# Patient Record
Sex: Female | Born: 1935 | Race: White | Hispanic: No | Smoking: Former smoker
Health system: Southern US, Community
[De-identification: ages and names within clinical notes are randomized; demographics above are authoritative.]

## PROBLEM LIST (undated history)

## (undated) DIAGNOSIS — R112 Nausea with vomiting, unspecified: Secondary | ICD-10-CM

## (undated) DIAGNOSIS — Z9889 Other specified postprocedural states: Secondary | ICD-10-CM

## (undated) DIAGNOSIS — Z9289 Personal history of other medical treatment: Secondary | ICD-10-CM

## (undated) DIAGNOSIS — M1990 Unspecified osteoarthritis, unspecified site: Secondary | ICD-10-CM

## (undated) DIAGNOSIS — J189 Pneumonia, unspecified organism: Secondary | ICD-10-CM

## (undated) DIAGNOSIS — D649 Anemia, unspecified: Secondary | ICD-10-CM

## (undated) DIAGNOSIS — K219 Gastro-esophageal reflux disease without esophagitis: Secondary | ICD-10-CM

## (undated) DIAGNOSIS — F419 Anxiety disorder, unspecified: Secondary | ICD-10-CM

## (undated) DIAGNOSIS — IMO0001 Reserved for inherently not codable concepts without codable children: Secondary | ICD-10-CM

## (undated) DIAGNOSIS — Z974 Presence of external hearing-aid: Secondary | ICD-10-CM

## (undated) DIAGNOSIS — I10 Essential (primary) hypertension: Secondary | ICD-10-CM

## (undated) DIAGNOSIS — H409 Unspecified glaucoma: Secondary | ICD-10-CM

## (undated) DIAGNOSIS — Q2730 Arteriovenous malformation, site unspecified: Secondary | ICD-10-CM

## (undated) DIAGNOSIS — H9190 Unspecified hearing loss, unspecified ear: Secondary | ICD-10-CM

## (undated) DIAGNOSIS — Z8679 Personal history of other diseases of the circulatory system: Secondary | ICD-10-CM

## (undated) DIAGNOSIS — C801 Malignant (primary) neoplasm, unspecified: Secondary | ICD-10-CM

## (undated) DIAGNOSIS — Z8489 Family history of other specified conditions: Secondary | ICD-10-CM

## (undated) DIAGNOSIS — Z8719 Personal history of other diseases of the digestive system: Secondary | ICD-10-CM

## (undated) DIAGNOSIS — Z973 Presence of spectacles and contact lenses: Secondary | ICD-10-CM

## (undated) HISTORY — PX: BREAST SURGERY: SHX581

## (undated) HISTORY — PX: TONSILLECTOMY: SUR1361

## (undated) HISTORY — PX: BACK SURGERY: SHX140

## (undated) HISTORY — PX: ABDOMINAL HYSTERECTOMY: SHX81

---

## 1961-07-22 DIAGNOSIS — Z9289 Personal history of other medical treatment: Secondary | ICD-10-CM

## 1961-07-22 HISTORY — DX: Personal history of other medical treatment: Z92.89

## 1995-03-20 ENCOUNTER — Encounter: Payer: Self-pay | Admitting: Gastroenterology

## 2000-09-05 ENCOUNTER — Encounter: Payer: Self-pay | Admitting: Gastroenterology

## 2000-10-21 ENCOUNTER — Ambulatory Visit (HOSPITAL_COMMUNITY): Admission: RE | Admit: 2000-10-21 | Discharge: 2000-10-21 | Payer: Self-pay | Admitting: Ophthalmology

## 2001-04-16 ENCOUNTER — Encounter: Payer: Self-pay | Admitting: Ophthalmology

## 2001-04-21 ENCOUNTER — Ambulatory Visit (HOSPITAL_COMMUNITY): Admission: RE | Admit: 2001-04-21 | Discharge: 2001-04-21 | Payer: Self-pay | Admitting: Ophthalmology

## 2002-04-16 ENCOUNTER — Other Ambulatory Visit: Admission: RE | Admit: 2002-04-16 | Discharge: 2002-04-16 | Payer: Self-pay | Admitting: Geriatric Medicine

## 2002-05-28 ENCOUNTER — Encounter: Payer: Self-pay | Admitting: Internal Medicine

## 2002-06-01 ENCOUNTER — Encounter: Admission: RE | Admit: 2002-06-01 | Discharge: 2002-06-01 | Payer: Self-pay | Admitting: Radiology

## 2002-06-01 ENCOUNTER — Encounter: Payer: Self-pay | Admitting: Radiology

## 2002-09-29 ENCOUNTER — Encounter: Admission: RE | Admit: 2002-09-29 | Discharge: 2002-09-29 | Payer: Self-pay | Admitting: Geriatric Medicine

## 2002-09-29 ENCOUNTER — Encounter: Payer: Self-pay | Admitting: Geriatric Medicine

## 2003-04-06 ENCOUNTER — Encounter: Admission: RE | Admit: 2003-04-06 | Discharge: 2003-04-06 | Payer: Self-pay | Admitting: Geriatric Medicine

## 2003-04-06 ENCOUNTER — Encounter: Payer: Self-pay | Admitting: Geriatric Medicine

## 2003-08-05 ENCOUNTER — Ambulatory Visit (HOSPITAL_COMMUNITY): Admission: RE | Admit: 2003-08-05 | Discharge: 2003-08-05 | Payer: Self-pay | Admitting: Orthopedic Surgery

## 2003-08-29 ENCOUNTER — Encounter: Admission: RE | Admit: 2003-08-29 | Discharge: 2003-08-29 | Payer: Self-pay | Admitting: Orthopedic Surgery

## 2003-09-13 ENCOUNTER — Encounter: Admission: RE | Admit: 2003-09-13 | Discharge: 2003-09-13 | Payer: Self-pay | Admitting: Orthopedic Surgery

## 2003-10-05 ENCOUNTER — Encounter: Admission: RE | Admit: 2003-10-05 | Discharge: 2003-10-05 | Payer: Self-pay | Admitting: Orthopedic Surgery

## 2004-08-20 ENCOUNTER — Encounter: Admission: RE | Admit: 2004-08-20 | Discharge: 2004-08-20 | Payer: Self-pay | Admitting: Geriatric Medicine

## 2004-09-21 ENCOUNTER — Encounter: Admission: RE | Admit: 2004-09-21 | Discharge: 2004-09-21 | Payer: Self-pay | Admitting: Geriatric Medicine

## 2004-11-01 ENCOUNTER — Encounter: Admission: RE | Admit: 2004-11-01 | Discharge: 2004-11-01 | Payer: Self-pay | Admitting: Geriatric Medicine

## 2005-08-30 ENCOUNTER — Ambulatory Visit: Payer: Self-pay | Admitting: Gastroenterology

## 2005-09-13 ENCOUNTER — Encounter (INDEPENDENT_AMBULATORY_CARE_PROVIDER_SITE_OTHER): Payer: Self-pay | Admitting: *Deleted

## 2005-09-13 ENCOUNTER — Ambulatory Visit: Payer: Self-pay | Admitting: Gastroenterology

## 2009-03-29 ENCOUNTER — Encounter: Payer: Self-pay | Admitting: Gastroenterology

## 2009-04-27 ENCOUNTER — Encounter: Payer: Self-pay | Admitting: Gastroenterology

## 2009-05-02 ENCOUNTER — Ambulatory Visit: Payer: Self-pay | Admitting: Gastroenterology

## 2009-05-02 DIAGNOSIS — Z860100 Personal history of colon polyps, unspecified: Secondary | ICD-10-CM | POA: Insufficient documentation

## 2009-05-02 DIAGNOSIS — K219 Gastro-esophageal reflux disease without esophagitis: Secondary | ICD-10-CM | POA: Insufficient documentation

## 2009-05-02 DIAGNOSIS — D509 Iron deficiency anemia, unspecified: Secondary | ICD-10-CM | POA: Insufficient documentation

## 2009-05-03 ENCOUNTER — Ambulatory Visit: Payer: Self-pay | Admitting: Gastroenterology

## 2009-05-03 LAB — CONVERTED CEMR LAB
Eosinophils Absolute: 0.1 10*3/uL (ref 0.0–0.7)
Eosinophils Relative: 1.2 % (ref 0.0–5.0)
Lymphocytes Relative: 20 % (ref 12.0–46.0)
MCV: 78.7 fL (ref 78.0–100.0)
Monocytes Absolute: 0.6 10*3/uL (ref 0.1–1.0)
Neutrophils Relative %: 70.5 % (ref 43.0–77.0)
Platelets: 264 10*3/uL (ref 150.0–400.0)
WBC: 8.1 10*3/uL (ref 4.5–10.5)

## 2009-05-05 ENCOUNTER — Encounter (INDEPENDENT_AMBULATORY_CARE_PROVIDER_SITE_OTHER): Payer: Self-pay

## 2010-12-07 NOTE — H&P (Signed)
[place]. [facility]  Patient:    Amber Park, Amber Park Visit Number: [id number] MRN: [id number]          Service Type: DSU Location: [facility] 2899 19 Attending Physician:  Ivor Messier Dictated by:   Guadelupe Sabin, M.D. Admit Date:  04/21/2001   CC:         Amber Park, M.D.   History and Physical  REASON FOR ADMISSION:  This was a planned outpatient surgical admission of this 77 year old white female, admitted for cataract/implant surgery of the left eye.  PRESENT ILLNESS:  This patient has been followed in my office for routine eye care since August 25, 1989.  Examination at that time revealed a visual acuity of 20/25 -- right eye, 20/20 -- left eye, with correction and a normal ocular exam.  Over the ensuing years, the patient has gradually developed cataract formation.  The patient was previously admitted for cataract/implant surgery of the right eye on October 21, 2000.  This was uncomplicated and patient had good return of vision to 20/20 in the operated right eye.  Meanwhile, the unoperated left eye had deteriorated to 20/200 and the patient has elected to proceed with similar surgery at this time.  She signed an informed consent and arrangements were made for her outpatient readmission.  PAST MEDICAL HISTORY:  Patient is in good general health under the care of Dr. Ann Maki T. Park.  CURRENT MEDICATIONS:  Patients current medications include Premarin, Lipitor, calcium and multivitamins.  She is felt to be a good candidate for the procedure.  REVIEW OF SYSTEMS:  No cardiorespiratory complaints.  PHYSICAL EXAMINATION:  VITAL SIGNS:  As recorded on admission, blood pressure 142/68, temperature 97.4, pulse 68, respirations 18.  GENERAL APPEARANCE:  Patient is a pleasant, well-nourished, well-developed white female in no acute distress.  HEENT:  Eyes:  Visual acuity as noted above.  Slitlamp examination:  Nuclear cataract  formation, left eye; pseudophakia, right eye.  Detailed fundus examination:  Dilated with Mydriacyl.  Vitreous clear.  Retinae attached. Normal optic nerves, blood vessels, maculae.  CHEST:  Lungs clear to percussion and auscultation.  HEART:  Normal sinus rhythm.  No cardiomegaly.  No murmurs.  ABDOMEN:  Negative.  EXTREMITIES:  Negative.  ADMISSION DIAGNOSES: 1. Senile cataract, left eye. 2. Pseudophakia, right eye.  SURGICAL PLAN:  Cataract/implant surgery, left eye. Dictated by:   Guadelupe Sabin, M.D. Attending Physician:  Ivor Messier DD:  04/21/01 TD:  04/21/01 Job: [id number]

## 2010-12-07 NOTE — Op Note (Signed)
[place]. [facility]  Patient:    Amber Park, Amber Park Visit Number: [id number] MRN: [id number]          Service Type: DSU Location: [facility] 2899 19 Attending Physician:  Ivor Messier Dictated by:   Guadelupe Sabin, M.D. Proc. Date: 04/21/01 Admit Date:  04/21/2001   CC:         Hal T. Stoneking, M.D.   Operative Report  PREOPERATIVE DIAGNOSIS:  Senile nuclear cataract, left eye.  POSTOPERATIVE DIAGNOSIS:  Senile nuclear cataract, left eye.  OPERATION:  Planned extracapsular cataract extraction -- phacoemulsification, primary insertion of posterior chamber intraocular lens implant.  SURGEON:  Guadelupe Sabin, M.D.  ASSISTANT:  Nurse.  ANESTHESIA:  Local 4% Xylocaine, 0.75% Marcaine retrobulbar block, topical tetracaine, intraocular Xylocaine.  Anesthesia standby required in this elderly patient; patient given sodium pentothal intravenously during the period of retrobulbar injection.  DESCRIPTION OF PROCEDURE:  After the patient was prepped and draped, a lid speculum was inserted in the left eye.  The eye was turned downward and a superior rectus traction suture placed.  Schiotz tonometry was recorded at 5 scale units with a 5.5 g weight.  A peritomy was performed adjacent to the limbus from the 11 to 1 oclock position.  The corneoscleral junction was cleaned and a corneoscleral groove made with a 45 degree Superblade.  The anterior chamber was then entered with the 2.5-mm diamond keratome at the 12 oclock position and the 15 degree blade at the 2:30 position.  Using a bent 26-gauge needle on a Healon syringe, a circular capsulorrhexis was begun and then completed with the Grabow forceps.  Hydrodissection and hydrodelineation were performed using 1% Xylocaine.  A 30 degree phacoemulsification tip was then inserted with slow controlled emulsification of the lens nucleus.  Total ultrasonic time -- 44 seconds, average power level 16%, total  amount of fluid used -- 45 cc.  Following removal of the nucleus, the residual cortex was aspirated with the irrigation-aspiration tip.  The posterior capsule appeared intact with a brilliant red fundus reflex.  It was therefore elected to insert an [employer] Opt[employer] [id number] silicone three-piece posterior chamber intraocular lens implant with UV absorber, diopter strength +15.00.  This was inserted with the McDonald forceps into the anterior chamber and then centered into the capsular bag using the [facility] lens rotator.  The lens appeared to be in good position horizontally.  The Healon which had been used during the procedure was then aspirated and replaced with balanced salt solution and Miochol ophthalmic solution.  The conjunctiva was brushed forward and Maxitrol ointment instilled in the conjunctival cul-de-sac.  A light patch and protective shield were applied.  Duration of procedure and anesthesia administration:  Forty-five minutes.  Patient tolerated the procedure well in general and left the operating room for the recovery room in good condition. Dictated by:   Guadelupe Sabin, M.D. Attending Physician:  Ivor Messier DD:  04/21/01 TD:  04/21/01 Job: [id number]

## 2010-12-07 NOTE — H&P (Signed)
[place]. [facility]  Patient:    Amber Park, Amber Park                      MRN: [id number] Adm. Date:  10/21/00 Attending:  Guadelupe Sabin, M.D. CC:         Hal T. Stoneking, M.D.   History and Physical  CHIEF COMPLAINT: This was a planned outpatient surgical admission of this 77 year old white female, admitted for cataract implant surgery of the right eye.  HISTORY OF PRESENT ILLNESS: This patient was first seen in my office on August 25, 1989 for routine examination.  Examination revealed a visual acuity of 20/25 in the right eye and 20/20 in the left eye with her present glasses and an essentially normal ocular examination.  Over the ensuing years the patient since 1997 has gradually developed cataract formation in both eyes.  This was initially corrected with changing glasses but recently the patient has been unable to improve vision with her glasses and she has elected to proceed with cataract implant surgery at this time.  The patient notes difficulty in driving, especially at night, and a film-like sensation over both eyes.  The patient has also noted that she now reads better without her glasses than with them.  She signed an informed consent and arrangements were made for her outpatient admission at this time.  PAST MEDICAL HISTORY: The patient is under the care of Dr. Merlene Laughter.  CURRENT MEDICATIONS:  1. Lipitor.  2. Premarin.  3. Vitamin C.  REVIEW OF SYSTEMS: No cardiorespiratory complaints.  PHYSICAL EXAMINATION:  VITAL SIGNS: As recorded on admission blood pressure is 140/70, heart rate 64, respirations 16, and temperature 97.2 degrees.  GENERAL: The patient is a pleasant, well-developed, well-nourished white female in no acute ocular distress.  HEENT: Visual acuity with best correction 20/40 right eye, 20/30 left eye. Applanation tonometry 19 mm right eye, 18 mm left eye.  External ocular and slit lamp examination shows  nuclear cataract formation, both eyes. Funduscopic examination (dilated) revealed a clear vitreous, attached retina, with normal optic nerve, blood vessels, and macula.  CHEST: Lungs clear to auscultation and percussion.  HEART: Normal sinus rhythm.  No cardiomegaly, no murmurs.  ABDOMEN: Negative.  EXTREMITIES: Negative.  ADMISSION DIAGNOSIS: Senile cataract, both eyes.  SURGICAL PLAN: Cataract implant surgery, right eye now and left eye later as-needed. DD:  10/21/00 TD:  10/21/00 Job: [id number]

## 2010-12-07 NOTE — Op Note (Signed)
[place]. [facility]  Patient:    Amber Park, Amber Park                      MRN: [id number] Proc. Date: 10/21/00 Attending:  Guadelupe Sabin, M.D. CC:         Hal T. Stoneking, M.D.   Operative Report  PREOPERATIVE DIAGNOSIS: Senile nuclear cataract, right eye.  POSTOPERATIVE DIAGNOSIS: Senile nuclear cataract, right eye.  OPERATION/PROCEDURE: Planned extracapsular cataract extraction with phacoemulsification and primary insertion of posterior chamber intraocular lens implant.  SURGEON: Guadelupe Sabin, M.D.  ASSISTANT: Nurse.  ANESTHESIA: Local 4% xylocaine, 0.75% Marcaine, topical tetracaine, intraocular xylocaine.  The patient was given sodium pentothal intravenously during the period of retrobulbar blocking.  DESCRIPTION OF PROCEDURE: After the patient was prepped and draped a lid speculum was inserted in the right eye.  Schiotz tonometry was recorded at 6 scale units with a 5.5 g weight.  A peritomy was performed adjacent to the limbus from the 11 oclock to one oclock position after a superior rectus muscle traction suture had been placed.  A corneoscleral groove was made with a 45 degree Superblade and the anterior chamber then entered with the 15 degree blade at the 2:30 oclock position and the 2.5 mm diamond keratome at the 12 oclock position.  Using a bent 26 gauge needle on a Healon syringe a circular capsulorrhexis was begun and then completed with the Grabow forceps. Hydrodissection and hydrodelineation were performed using 1% xylocaine.  A 30 degree phacoemulsification tip was then inserted and slow emulsification of the lens nucleus achieved.  Total ultrasonic time was 45 seconds, average power level 17%, and total amount of fluid used 35 cc.  Following removal of the nucleus the residual cortex was aspirated with the irrigation-aspiration tip.  The posterior capsule appeared intact with brilliant red fundus reflex. It was therefore  elected to insert an [employer] [id number] silicone three-piece posterior chamber intraocular lens implant with UV absorber, diopter strength +15.50.  This was inserted with the [employer] forceps into the anterior chamber and then centered into the capsular bag using the Lister lens rotator.  The lens appeared to be well centered.  The Healon which had been used in the procedure was then aspirated and replaced with balanced salt solution and Miochol ophthalmic solution.  The operative incisions appeared to be self-sealing and no sutures were required.  Maxitrol ointment was instilled in the conjunctival cul-de-sac and a light patch and protective shield applied.  Duration of the procedure and anesthesia administration was 45 minutes.  The patient tolerated the procedure well in general and left the operating room for the recovery room in good condition. DD:  10/21/00 TD:  10/21/00 Job: [id number]

## 2014-03-03 ENCOUNTER — Encounter: Payer: Self-pay | Admitting: Gastroenterology

## 2014-03-17 ENCOUNTER — Encounter: Payer: Self-pay | Admitting: Gastroenterology

## 2014-03-31 ENCOUNTER — Ambulatory Visit: Payer: Self-pay | Admitting: Podiatry

## 2014-05-19 ENCOUNTER — Encounter (HOSPITAL_COMMUNITY): Payer: Self-pay | Admitting: Pharmacy Technician

## 2014-05-24 NOTE — Patient Instructions (Addendum)
Amber Park  05/24/2014   Your procedure is scheduled on:  05/31/2014    Come thru the [facility].    Follow the Signs to [place] at  0710      am  Call this number if you have problems the morning of surgery: [phone]   Remember:   Do not eat food or drink liquids after midnight.   Take these medicines the morning of surgery with A SIP OF WATER: Pepcid Ac if needed    Do not wear jewelry, make-up or nail polish.  Do not wear lotions, powders, or perfumes. deodorant.  Do not shave 48 hours prior to surgery.   Do not bring valuables to the hospital.  Contacts, dentures or bridgework may not be worn into surgery.      [place] - Preparing for Surgery Before surgery, you can play an important role.  Because skin is not sterile, your skin needs to be as free of germs as possible.  You can reduce the number of germs on your skin by washing with CHG (chlorahexidine gluconate) soap before surgery.  CHG is an antiseptic cleaner which kills germs and bonds with the skin to continue killing germs even after washing. Please DO NOT use if you have an allergy to CHG or antibacterial soaps.  If your skin becomes reddened/irritated stop using the CHG and inform your nurse when you arrive at Short Stay. Do not shave (including legs and underarms) for at least 48 hours prior to the first CHG shower.  You may shave your face/neck. Please follow these instructions carefully:  1.  Shower with CHG Soap the night before surgery and the  morning of Surgery.  2.  If you choose to wash your hair, wash your hair first as usual with your  normal  shampoo.  3.  After you shampoo, rinse your hair and body thoroughly to remove the  shampoo.                           4.  Use CHG as you would any other liquid soap.  You can apply chg directly  to the skin and wash                       Gently with a scrungie or clean washcloth.  5.  Apply the CHG Soap to your body ONLY FROM THE NECK DOWN.    Do not use on face/ open                           Wound or open sores. Avoid contact with eyes, ears mouth and genitals (private parts).                       Wash face,  Genitals (private parts) with your normal soap.             6.  Wash thoroughly, paying special attention to the area where your surgery  will be performed.  7.  Thoroughly rinse your body with warm water from the neck down.  8.  DO NOT shower/wash with your normal soap after using and rinsing off  the CHG Soap.                9.  Pat yourself dry with a clean towel.  10.  Wear clean pajamas.            11.  Place clean sheets on your bed the night of your first shower and do not  sleep with pets. Day of Surgery : Do not apply any lotions/deodorants the morning of surgery.  Please wear clean clothes to the hospital/surgery center.  FAILURE TO FOLLOW THESE INSTRUCTIONS MAY RESULT IN THE CANCELLATION OF YOUR SURGERY PATIENT SIGNATURE_________________________________  NURSE SIGNATURE__________________________________  ________________________________________________________________________  WHAT IS A BLOOD TRANSFUSION? Blood Transfusion Information  A transfusion is the replacement of blood or some of its parts. Blood is made up of multiple cells which provide different functions.  Red blood cells carry oxygen and are used for blood loss replacement.  White blood cells fight against infection.  Platelets control bleeding.  Plasma helps clot blood.  Other blood products are available for specialized needs, such as hemophilia or other clotting disorders. BEFORE THE TRANSFUSION  Who gives blood for transfusions?   Healthy volunteers who are fully evaluated to make sure their blood is safe. This is blood bank blood. Transfusion therapy is the safest it has ever been in the practice of medicine. Before blood is taken from a donor, a complete history is taken to make sure that person has no history of diseases  nor engages in risky social behavior (examples are intravenous drug use or sexual activity with multiple partners). The donor's travel history is screened to minimize risk of transmitting infections, such as malaria. The donated blood is tested for signs of infectious diseases, such as HIV and hepatitis. The blood is then tested to be sure it is compatible with you in order to minimize the chance of a transfusion reaction. If you or a relative donates blood, this is often done in anticipation of surgery and is not appropriate for emergency situations. It takes many days to process the donated blood. RISKS AND COMPLICATIONS Although transfusion therapy is very safe and saves many lives, the main dangers of transfusion include:  1. Getting an infectious disease. 2. Developing a transfusion reaction. This is an allergic reaction to something in the blood you were given. Every precaution is taken to prevent this. The decision to have a blood transfusion has been considered carefully by your caregiver before blood is given. Blood is not given unless the benefits outweigh the risks. AFTER THE TRANSFUSION  Right after receiving a blood transfusion, you will usually feel much better and more energetic. This is especially true if your red blood cells have gotten low (anemic). The transfusion raises the level of the red blood cells which carry oxygen, and this usually causes an energy increase.  The nurse administering the transfusion will monitor you carefully for complications. HOME CARE INSTRUCTIONS  No special instructions are needed after a transfusion. You may find your energy is better. Speak with your caregiver about any limitations on activity for underlying diseases you may have. SEEK MEDICAL CARE IF:   Your condition is not improving after your transfusion.  You develop redness or irritation at the intravenous (IV) site. SEEK IMMEDIATE MEDICAL CARE IF:  Any of the following symptoms occur over  the next 12 hours:  Shaking chills.  You have a temperature by mouth above 102 F (38.9 C), not controlled by medicine.  Chest, back, or muscle pain.  People around you feel you are not acting correctly or are confused.  Shortness of breath or difficulty breathing.  Dizziness and fainting.  You get a rash or develop  hives.  You have a decrease in urine output.  Your urine turns a dark color or changes to pink, red, or brown. Any of the following symptoms occur over the next 10 days:  You have a temperature by mouth above 102 F (38.9 C), not controlled by medicine.  Shortness of breath.  Weakness after normal activity.  The white part of the eye turns yellow (jaundice).  You have a decrease in the amount of urine or are urinating less often.  Your urine turns a dark color or changes to pink, red, or brown. Document Released: 07/05/2000 Document Revised: 09/30/2011 Document Reviewed: 02/22/2008 ExitCare Patient Information 2014 [place].  _______________________________________________________________________  Incentive Spirometer  An incentive spirometer is a tool that can help keep your lungs clear and active. This tool measures how well you are filling your lungs with each breath. Taking long deep breaths may help reverse or decrease the chance of developing breathing (pulmonary) problems (especially infection) following:  A long period of time when you are unable to move or be active. BEFORE THE PROCEDURE   If the spirometer includes an indicator to show your best effort, your nurse or respiratory therapist will set it to a desired goal.  If possible, sit up straight or lean slightly forward. Try not to slouch.  Hold the incentive spirometer in an upright position. INSTRUCTIONS FOR USE  3. Sit on the edge of your bed if possible, or sit up as far as you can in bed or on a chair. 4. Hold the incentive spirometer in an upright position. 5. Breathe out  normally. 6. Place the mouthpiece in your mouth and seal your lips tightly around it. 7. Breathe in slowly and as deeply as possible, raising the piston or the ball toward the top of the column. 8. Hold your breath for 3-5 seconds or for as long as possible. Allow the piston or ball to fall to the bottom of the column. 9. Remove the mouthpiece from your mouth and breathe out normally. 10. Rest for a few seconds and repeat Steps 1 through 7 at least 10 times every 1-2 hours when you are awake. Take your time and take a few normal breaths between deep breaths. 11. The spirometer may include an indicator to show your best effort. Use the indicator as a goal to work toward during each repetition. 12. After each set of 10 deep breaths, practice coughing to be sure your lungs are clear. If you have an incision (the cut made at the time of surgery), support your incision when coughing by placing a pillow or rolled up towels firmly against it. Once you are able to get out of bed, walk around indoors and cough well. You may stop using the incentive spirometer when instructed by your caregiver.  RISKS AND COMPLICATIONS  Take your time so you do not get dizzy or light-headed.  If you are in pain, you may need to take or ask for pain medication before doing incentive spirometry. It is harder to take a deep breath if you are having pain. AFTER USE  Rest and breathe slowly and easily.  It can be helpful to keep track of a log of your progress. Your caregiver can provide you with a simple table to help with this. If you are using the spirometer at home, follow these instructions: [place] IF:   You are having difficultly using the spirometer.  You have trouble using the spirometer as often as instructed.  Your  pain medication is not giving enough relief while using the spirometer.  You develop fever of 100.5 F (38.1 C) or higher. SEEK IMMEDIATE MEDICAL CARE IF:   You cough up bloody sputum  that had not been present before.  You develop fever of 102 F (38.9 C) or greater.  You develop worsening pain at or near the incision site. MAKE SURE YOU:   Understand these instructions.  Will watch your condition.  Will get help right away if you are not doing well or get worse. Document Released: 11/18/2006 Document Revised: 09/30/2011 Document Reviewed: 01/19/2007 ExitCare Patient Information 2014 ExitCare, [place].   ________________________________________________________________________      Please read over the following fact sheets that you were given: MRSA Information, coughing and deep breathing exercises, leg exercises

## 2014-05-25 ENCOUNTER — Ambulatory Visit (HOSPITAL_COMMUNITY)
Admission: RE | Admit: 2014-05-25 | Discharge: 2014-05-25 | Disposition: A | Discharge status: Home / Self Care | Payer: Medicare Other | Source: Ambulatory Visit | Attending: Orthopedic Surgery | Admitting: Orthopedic Surgery

## 2014-05-25 ENCOUNTER — Encounter (HOSPITAL_COMMUNITY): Payer: Self-pay

## 2014-05-25 ENCOUNTER — Encounter (INDEPENDENT_AMBULATORY_CARE_PROVIDER_SITE_OTHER): Payer: Self-pay

## 2014-05-25 ENCOUNTER — Encounter (HOSPITAL_COMMUNITY)
Admission: RE | Admit: 2014-05-25 | Discharge: 2014-05-25 | Disposition: A | Discharge status: Home / Self Care | Payer: Medicare Other | Source: Ambulatory Visit | Attending: Orthopedic Surgery | Admitting: Orthopedic Surgery

## 2014-05-25 DIAGNOSIS — M25559 Pain in unspecified hip: Secondary | ICD-10-CM | POA: Insufficient documentation

## 2014-05-25 DIAGNOSIS — Z01818 Encounter for other preprocedural examination: Secondary | ICD-10-CM | POA: Insufficient documentation

## 2014-05-25 DIAGNOSIS — R0602 Shortness of breath: Secondary | ICD-10-CM

## 2014-05-25 DIAGNOSIS — Z87891 Personal history of nicotine dependence: Secondary | ICD-10-CM | POA: Insufficient documentation

## 2014-05-25 HISTORY — DX: Pneumonia, unspecified organism: J18.9

## 2014-05-25 HISTORY — DX: Unspecified osteoarthritis, unspecified site: M19.90

## 2014-05-25 HISTORY — DX: Anxiety disorder, unspecified: F41.9

## 2014-05-25 HISTORY — DX: Malignant (primary) neoplasm, unspecified: C80.1

## 2014-05-25 HISTORY — DX: Personal history of other diseases of the circulatory system: Z86.79

## 2014-05-25 HISTORY — DX: Reserved for inherently not codable concepts without codable children: IMO0001

## 2014-05-25 HISTORY — DX: Gastro-esophageal reflux disease without esophagitis: K21.9

## 2014-05-25 HISTORY — DX: Personal history of other medical treatment: Z92.89

## 2014-05-25 HISTORY — DX: Other specified postprocedural states: R11.2

## 2014-05-25 HISTORY — DX: Other specified postprocedural states: Z98.890

## 2014-05-25 LAB — CBC
HCT: 33.9 % — ABNORMAL LOW (ref 36.0–46.0)
HEMOGLOBIN: 10.3 g/dL — AB (ref 12.0–15.0)
MCH: 23.8 pg — AB (ref 26.0–34.0)
MCHC: 30.4 g/dL (ref 30.0–36.0)
MCV: 78.5 fL (ref 78.0–100.0)
Platelets: 349 10*3/uL (ref 150–400)
RBC: 4.32 MIL/uL (ref 3.87–5.11)
RDW: 16.8 % — ABNORMAL HIGH (ref 11.5–15.5)
WBC: 8.6 10*3/uL (ref 4.0–10.5)

## 2014-05-25 LAB — BASIC METABOLIC PANEL
Anion gap: 11 (ref 5–15)
BUN: 17 mg/dL (ref 6–23)
CO2: 27 mEq/L (ref 19–32)
CREATININE: 0.94 mg/dL (ref 0.50–1.10)
Calcium: 9.6 mg/dL (ref 8.4–10.5)
Chloride: 96 mEq/L (ref 96–112)
GFR, EST AFRICAN AMERICAN: 61 mL/min — AB (ref >90)
GFR, EST NON AFRICAN AMERICAN: 53 mL/min — AB (ref >90)
Glucose, Bld: 88 mg/dL (ref 70–99)
POTASSIUM: 3.6 meq/L — AB (ref 3.7–5.3)
Sodium: 134 mEq/L — ABNORMAL LOW (ref 137–147)

## 2014-05-25 LAB — URINALYSIS, ROUTINE W REFLEX MICROSCOPIC
Bilirubin Urine: NEGATIVE
Glucose, UA: NEGATIVE mg/dL
Hgb urine dipstick: NEGATIVE
Ketones, ur: NEGATIVE mg/dL
NITRITE: NEGATIVE
PH: 5 (ref 5.0–8.0)
Protein, ur: NEGATIVE mg/dL
Specific Gravity, Urine: 1.007 (ref 1.005–1.030)
UROBILINOGEN UA: 0.2 mg/dL (ref 0.0–1.0)

## 2014-05-25 LAB — URINE MICROSCOPIC-ADD ON

## 2014-05-25 LAB — PROTIME-INR
INR: 0.97 (ref 0.00–1.49)
PROTHROMBIN TIME: 13 s (ref 11.6–15.2)

## 2014-05-25 LAB — SURGICAL PCR SCREEN
MRSA, PCR: NEGATIVE
STAPHYLOCOCCUS AUREUS: NEGATIVE

## 2014-05-25 LAB — APTT: aPTT: 31 seconds (ref 24–37)

## 2014-05-25 LAB — ABO/RH: ABO/RH(D): O POS

## 2014-05-25 NOTE — Progress Notes (Signed)
Clearance- DR Felipa Eth- 01/30/2014 on chart

## 2014-05-26 ENCOUNTER — Encounter (HOSPITAL_COMMUNITY): Payer: Self-pay

## 2014-05-26 NOTE — Progress Notes (Signed)
Old ekg dr stoneking left bundle branch block 06-23-11 on patient chart

## 2014-05-29 NOTE — H&P (Signed)
TOTAL HIP ADMISSION H&P  Patient is admitted for left total hip arthroplasty, anterior approach.  Subjective:  Chief Complaint:      Left hip primary OA / pain  HPI: Amber Park, 80 y.o. female, has a history of pain and functional disability in the left hip(s) due to arthritis and patient has failed non-surgical conservative treatments for greater than 12 weeks to include NSAID's and/or analgesics, corticosteriod injections and activity modification.  Onset of symptoms was gradual starting 2+ years ago with gradually worsening course since that time.The patient noted no past surgery on the left hip(s).  Patient currently rates pain in the left hip at 9 out of 10 with activity. Patient has night pain, worsening of pain with activity and weight bearing, trendelenberg gait, pain that interfers with activities of daily living and pain with passive range of motion. Patient has evidence of periarticular osteophytes and joint space narrowing by imaging studies. This condition presents safety issues increasing the risk of falls.  There is no current active infection.  Risks, benefits and expectations were discussed with the patient.  Risks including but not limited to the risk of anesthesia, blood clots, nerve damage, blood vessel damage, failure of the prosthesis, infection and up to and including death.  Patient understand the risks, benefits and expectations and wishes to proceed with surgery.    PCP: Mathews Argyle, MD  D/C Plans:      Home with HHPT/SNF Tarri Glenn)  Post-op Meds:       No Rx given  Tranexamic Acid:      To be given - IV    Decadron:      is to be given  FYI:     ASA post-op  Tramadol and APAP post-op    Patient Active Problem List   Diagnosis Date Noted  . ANEMIA, IRON DEFICIENCY 05/02/2009  . GERD 05/02/2009  . PERSONAL HX COLONIC POLYPS 05/02/2009   Past Medical History  Diagnosis Date  . PONV (postoperative nausea and vomiting)   . Shortness of breath  dyspnea     with exertion   . Pneumonia     hx of pneumonia x 4   . Anxiety   . GERD (gastroesophageal reflux disease)   . Arthritis   . Cancer     hx of skin cancer removed from nose   . History of blood transfusion 1963  . History of left bundle branch block     old ekg 06-23-11 dr Felipa Eth on chart    Past Surgical History  Procedure Laterality Date  . Back surgery      x 2  . Tonsillectomy    . Breast surgery      bilateral cyst removal   . Abdominal hysterectomy      No prescriptions prior to admission   Allergies  Allergen Reactions  . Meperidine Hcl     REACTION: N/V    History  Substance Use Topics  . Smoking status: Former Smoker    Quit date: 07/22/1998  . Smokeless tobacco: Never Used  . Alcohol Use: Yes     Comment: rare        Review of Systems  Constitutional: Positive for malaise/fatigue.  HENT: Negative.   Eyes: Negative.   Respiratory: Positive for shortness of breath (on exertion).   Cardiovascular: Negative.   Gastrointestinal: Positive for heartburn.  Genitourinary: Negative.   Musculoskeletal: Positive for joint pain.  Skin: Negative.   Neurological: Positive for sensory change.  Endo/Heme/Allergies: Negative.  Psychiatric/Behavioral: The patient is nervous/anxious.     Objective:  Physical Exam  Constitutional: She is oriented to person, place, and time. She appears well-developed and well-nourished.  HENT:  Head: Normocephalic and atraumatic.  Eyes: Pupils are equal, round, and reactive to light.  Neck: Neck supple. No JVD present. No tracheal deviation present. No thyromegaly present.  Cardiovascular: Normal rate, regular rhythm, normal heart sounds and intact distal pulses.   Respiratory: Effort normal and breath sounds normal. No stridor. No respiratory distress. She has no wheezes.  GI: Soft. There is no tenderness. There is no guarding.  Musculoskeletal:       Left hip: She exhibits decreased range of motion, decreased  strength, tenderness and bony tenderness. She exhibits no deformity and no laceration.  Lymphadenopathy:    She has no cervical adenopathy.  Neurological: She is alert and oriented to person, place, and time. A sensory deficit (decreased sensation in her feet) is present.  Skin: Skin is warm and dry.  Psychiatric: She has a normal mood and affect.     Labs:  Estimated body mass index is 27.98 kg/(m^2) as calculated from the following:   Height as of 05/02/09: 5\' 2"  (1.575 m).   Weight as of 05/02/09: 69.4 kg (153 lb).   Imaging Review Plain radiographs demonstrate severe degenerative joint disease of the left hip(s). The bone quality appears to be good for age and reported activity level.  Assessment/Plan:  End stage arthritis, left hip(s)  The patient history, physical examination, clinical judgement of the provider and imaging studies are consistent with end stage degenerative joint disease of the left hip(s) and total hip arthroplasty is deemed medically necessary. The treatment options including medical management, injection therapy, arthroscopy and arthroplasty were discussed at length. The risks and benefits of total hip arthroplasty were presented and reviewed. The risks due to aseptic loosening, infection, stiffness, dislocation/subluxation,  thromboembolic complications and other imponderables were discussed.  The patient acknowledged the explanation, agreed to proceed with the plan and consent was signed. Patient is being admitted for inpatient treatment for surgery, pain control, PT, OT, prophylactic antibiotics, VTE prophylaxis, progressive ambulation and ADL's and discharge planning.The patient is planning to be discharged to skilled nursing facility.      West Pugh Makinzee Durley   PA-C  05/29/2014, 6:20 PM

## 2014-05-31 ENCOUNTER — Encounter (HOSPITAL_COMMUNITY)
Admission: RE | Disposition: A | Discharge status: Skilled Nursing Facility | Payer: Self-pay | Source: Ambulatory Visit | Attending: Orthopedic Surgery

## 2014-05-31 ENCOUNTER — Inpatient Hospital Stay (HOSPITAL_COMMUNITY): Payer: Medicare Other

## 2014-05-31 ENCOUNTER — Inpatient Hospital Stay (HOSPITAL_COMMUNITY): Payer: Medicare Other | Admitting: Anesthesiology

## 2014-05-31 ENCOUNTER — Encounter (HOSPITAL_COMMUNITY): Payer: Self-pay | Admitting: *Deleted

## 2014-05-31 ENCOUNTER — Inpatient Hospital Stay (HOSPITAL_COMMUNITY)
Admission: RE | Admit: 2014-05-31 | Discharge: 2014-06-02 | DRG: 470 | Disposition: A | Discharge status: Skilled Nursing Facility | Payer: Medicare Other | Source: Ambulatory Visit | Attending: Orthopedic Surgery | Admitting: Orthopedic Surgery

## 2014-05-31 DIAGNOSIS — Z87891 Personal history of nicotine dependence: Secondary | ICD-10-CM | POA: Diagnosis not present

## 2014-05-31 DIAGNOSIS — M1612 Unilateral primary osteoarthritis, left hip: Secondary | ICD-10-CM | POA: Diagnosis present

## 2014-05-31 DIAGNOSIS — Z6827 Body mass index (BMI) 27.0-27.9, adult: Secondary | ICD-10-CM

## 2014-05-31 DIAGNOSIS — K219 Gastro-esophageal reflux disease without esophagitis: Secondary | ICD-10-CM | POA: Diagnosis present

## 2014-05-31 DIAGNOSIS — Z96649 Presence of unspecified artificial hip joint: Secondary | ICD-10-CM

## 2014-05-31 DIAGNOSIS — E663 Overweight: Secondary | ICD-10-CM | POA: Diagnosis present

## 2014-05-31 HISTORY — PX: TOTAL HIP ARTHROPLASTY: SHX124

## 2014-05-31 LAB — TYPE AND SCREEN
ABO/RH(D): O POS
ANTIBODY SCREEN: NEGATIVE

## 2014-05-31 SURGERY — ARTHROPLASTY, HIP, TOTAL, ANTERIOR APPROACH
Anesthesia: Spinal | Site: Hip | Laterality: Left

## 2014-05-31 MED ORDER — MIDAZOLAM HCL 2 MG/2ML IJ SOLN
INTRAMUSCULAR | Status: AC
  Filled 2014-05-31: qty 2

## 2014-05-31 MED ORDER — METOCLOPRAMIDE HCL 5 MG/ML IJ SOLN: 5.0000 mg | Freq: Three times a day (TID) | INTRAMUSCULAR | Status: DC | PRN

## 2014-05-31 MED ORDER — DEXAMETHASONE SODIUM PHOSPHATE 10 MG/ML IJ SOLN
10.0000 mg | Freq: Once | INTRAMUSCULAR | Status: AC
  Administered 2014-05-31: 10 mg via INTRAVENOUS

## 2014-05-31 MED ORDER — DEXAMETHASONE SODIUM PHOSPHATE 10 MG/ML IJ SOLN
10.0000 mg | Freq: Once | INTRAMUSCULAR | Status: AC
  Administered 2014-06-01: 10 mg via INTRAVENOUS
  Filled 2014-05-31: qty 1

## 2014-05-31 MED ORDER — LACTATED RINGERS IV SOLN: INTRAVENOUS | Status: DC

## 2014-05-31 MED ORDER — 0.9 % SODIUM CHLORIDE (POUR BTL) OPTIME
TOPICAL | Status: DC | PRN
  Administered 2014-05-31: 1000 mL

## 2014-05-31 MED ORDER — ASPIRIN EC 325 MG PO TBEC
325.0000 mg | DELAYED_RELEASE_TABLET | Freq: Two times a day (BID) | ORAL | Status: DC
  Administered 2014-06-01 – 2014-06-02 (×3): 325 mg via ORAL
  Filled 2014-05-31 (×5): qty 1

## 2014-05-31 MED ORDER — HYDROMORPHONE HCL 1 MG/ML IJ SOLN: 0.2500 mg | INTRAMUSCULAR | Status: DC | PRN

## 2014-05-31 MED ORDER — OXYCODONE HCL 5 MG/5ML PO SOLN: 5.0000 mg | Freq: Once | ORAL | Status: DC | PRN

## 2014-05-31 MED ORDER — LACTATED RINGERS IV SOLN
INTRAVENOUS | Status: DC | PRN
  Administered 2014-05-31 (×2): via INTRAVENOUS

## 2014-05-31 MED ORDER — HYDROMORPHONE HCL 1 MG/ML IJ SOLN: 0.5000 mg | INTRAMUSCULAR | Status: DC | PRN

## 2014-05-31 MED ORDER — MAGNESIUM CITRATE PO SOLN: 1.0000 | Freq: Once | ORAL | Status: AC | PRN

## 2014-05-31 MED ORDER — HYDROCHLOROTHIAZIDE 25 MG PO TABS
25.0000 mg | ORAL_TABLET | Freq: Every day | ORAL | Status: DC
  Administered 2014-05-31 – 2014-06-02 (×3): 25 mg via ORAL
  Filled 2014-05-31 (×3): qty 1

## 2014-05-31 MED ORDER — PROPOFOL 10 MG/ML IV BOLUS
INTRAVENOUS | Status: AC
  Filled 2014-05-31: qty 20

## 2014-05-31 MED ORDER — CELECOXIB 200 MG PO CAPS
200.0000 mg | ORAL_CAPSULE | Freq: Two times a day (BID) | ORAL | Status: DC
  Administered 2014-05-31 – 2014-06-02 (×5): 200 mg via ORAL
  Filled 2014-05-31 (×6): qty 1

## 2014-05-31 MED ORDER — CEFAZOLIN SODIUM-DEXTROSE 2-3 GM-% IV SOLR
2.0000 g | INTRAVENOUS | Status: AC
  Administered 2014-05-31: 2 g via INTRAVENOUS

## 2014-05-31 MED ORDER — METHOCARBAMOL 500 MG PO TABS
500.0000 mg | ORAL_TABLET | Freq: Four times a day (QID) | ORAL | Status: DC | PRN
  Administered 2014-05-31 – 2014-06-01 (×3): 500 mg via ORAL
  Filled 2014-05-31 (×4): qty 1

## 2014-05-31 MED ORDER — OXYCODONE HCL 5 MG PO TABS: 5.0000 mg | ORAL_TABLET | Freq: Once | ORAL | Status: DC | PRN

## 2014-05-31 MED ORDER — TRANEXAMIC ACID 100 MG/ML IV SOLN
1000.0000 mg | Freq: Once | INTRAVENOUS | Status: AC
  Administered 2014-05-31: 1000 mg via INTRAVENOUS
  Filled 2014-05-31: qty 10

## 2014-05-31 MED ORDER — DIPHENHYDRAMINE HCL 25 MG PO CAPS: 25.0000 mg | ORAL_CAPSULE | Freq: Four times a day (QID) | ORAL | Status: DC | PRN

## 2014-05-31 MED ORDER — ACETAMINOPHEN 650 MG RE SUPP: 650.0000 mg | Freq: Four times a day (QID) | RECTAL | Status: DC | PRN

## 2014-05-31 MED ORDER — FERROUS SULFATE 325 (65 FE) MG PO TABS
325.0000 mg | ORAL_TABLET | Freq: Three times a day (TID) | ORAL | Status: DC
  Administered 2014-06-01 – 2014-06-02 (×4): 325 mg via ORAL
  Filled 2014-05-31 (×9): qty 1

## 2014-05-31 MED ORDER — CHLORHEXIDINE GLUCONATE 4 % EX LIQD: 60.0000 mL | Freq: Once | CUTANEOUS | Status: DC

## 2014-05-31 MED ORDER — POLYETHYLENE GLYCOL 3350 17 G PO PACK
17.0000 g | PACK | Freq: Two times a day (BID) | ORAL | Status: DC
  Administered 2014-05-31 – 2014-06-02 (×3): 17 g via ORAL

## 2014-05-31 MED ORDER — PROPOFOL 10 MG/ML IV BOLUS
INTRAVENOUS | Status: DC | PRN
  Administered 2014-05-31: 30 mg via INTRAVENOUS

## 2014-05-31 MED ORDER — SODIUM CHLORIDE 0.9 % IV SOLN
100.0000 mL/h | INTRAVENOUS | Status: DC
  Administered 2014-05-31 – 2014-06-01 (×2): 100 mL/h via INTRAVENOUS
  Filled 2014-05-31 (×7): qty 1000

## 2014-05-31 MED ORDER — CEFAZOLIN SODIUM-DEXTROSE 2-3 GM-% IV SOLR
INTRAVENOUS | Status: AC
  Filled 2014-05-31: qty 50

## 2014-05-31 MED ORDER — TRAMADOL HCL 50 MG PO TABS
50.0000 mg | ORAL_TABLET | Freq: Four times a day (QID) | ORAL | Status: DC
  Administered 2014-05-31 (×2): 100 mg via ORAL
  Administered 2014-06-01 (×3): 50 mg via ORAL
  Administered 2014-06-01: 100 mg via ORAL
  Administered 2014-06-02 (×3): 50 mg via ORAL
  Filled 2014-05-31 (×4): qty 1
  Filled 2014-05-31: qty 2
  Filled 2014-05-31: qty 1
  Filled 2014-05-31: qty 2
  Filled 2014-05-31: qty 1
  Filled 2014-05-31: qty 2

## 2014-05-31 MED ORDER — ACETAMINOPHEN 325 MG PO TABS
650.0000 mg | ORAL_TABLET | Freq: Four times a day (QID) | ORAL | Status: DC | PRN
Start: 2014-05-31 — End: 2014-06-02
  Administered 2014-05-31 – 2014-06-01 (×4): 650 mg via ORAL
  Filled 2014-05-31 (×4): qty 2

## 2014-05-31 MED ORDER — CEFAZOLIN SODIUM-DEXTROSE 2-3 GM-% IV SOLR
2.0000 g | Freq: Four times a day (QID) | INTRAVENOUS | Status: AC
  Administered 2014-05-31 (×2): 2 g via INTRAVENOUS
  Filled 2014-05-31 (×2): qty 50

## 2014-05-31 MED ORDER — BUPIVACAINE HCL (PF) 0.5 % IJ SOLN
INTRAMUSCULAR | Status: AC
  Filled 2014-05-31: qty 30

## 2014-05-31 MED ORDER — DOCUSATE SODIUM 100 MG PO CAPS
100.0000 mg | ORAL_CAPSULE | Freq: Two times a day (BID) | ORAL | Status: DC
  Administered 2014-05-31 – 2014-06-02 (×4): 100 mg via ORAL

## 2014-05-31 MED ORDER — ONDANSETRON HCL 4 MG PO TABS: 4.0000 mg | ORAL_TABLET | Freq: Four times a day (QID) | ORAL | Status: DC | PRN

## 2014-05-31 MED ORDER — DEXAMETHASONE SODIUM PHOSPHATE 10 MG/ML IJ SOLN
INTRAMUSCULAR | Status: AC
  Filled 2014-05-31: qty 1

## 2014-05-31 MED ORDER — METHOCARBAMOL 1000 MG/10ML IJ SOLN
500.0000 mg | Freq: Four times a day (QID) | INTRAMUSCULAR | Status: DC | PRN
  Administered 2014-05-31: 500 mg via INTRAVENOUS
  Filled 2014-05-31 (×2): qty 5

## 2014-05-31 MED ORDER — ALUM & MAG HYDROXIDE-SIMETH 200-200-20 MG/5ML PO SUSP: 30.0000 mL | ORAL | Status: DC | PRN

## 2014-05-31 MED ORDER — PROPOFOL INFUSION 10 MG/ML OPTIME
INTRAVENOUS | Status: DC | PRN
  Administered 2014-05-31: 180 ug/kg/min via INTRAVENOUS

## 2014-05-31 MED ORDER — LACTATED RINGERS IV SOLN
INTRAVENOUS | Status: DC
  Administered 2014-05-31: 1000 mL via INTRAVENOUS

## 2014-05-31 MED ORDER — MIDAZOLAM HCL 5 MG/5ML IJ SOLN
INTRAMUSCULAR | Status: DC | PRN
  Administered 2014-05-31 (×2): 1 mg via INTRAVENOUS

## 2014-05-31 MED ORDER — FAMOTIDINE 10 MG PO TABS
10.0000 mg | ORAL_TABLET | Freq: Two times a day (BID) | ORAL | Status: DC | PRN
  Administered 2014-05-31 – 2014-06-01 (×2): 10 mg via ORAL
  Filled 2014-05-31 (×4): qty 1

## 2014-05-31 MED ORDER — BISACODYL 10 MG RE SUPP: 10.0000 mg | Freq: Every day | RECTAL | Status: DC | PRN

## 2014-05-31 MED ORDER — MENTHOL 3 MG MT LOZG
1.0000 | LOZENGE | OROMUCOSAL | Status: DC | PRN
  Filled 2014-05-31: qty 9

## 2014-05-31 MED ORDER — PHENOL 1.4 % MT LIQD
1.0000 | OROMUCOSAL | Status: DC | PRN
  Filled 2014-05-31: qty 177

## 2014-05-31 MED ORDER — FENTANYL CITRATE 0.05 MG/ML IJ SOLN
INTRAMUSCULAR | Status: AC
  Filled 2014-05-31: qty 2

## 2014-05-31 MED ORDER — FENTANYL CITRATE 0.05 MG/ML IJ SOLN
INTRAMUSCULAR | Status: DC | PRN
  Administered 2014-05-31: 50 ug via INTRAVENOUS

## 2014-05-31 MED ORDER — METOCLOPRAMIDE HCL 10 MG PO TABS: 5.0000 mg | ORAL_TABLET | Freq: Three times a day (TID) | ORAL | Status: DC | PRN

## 2014-05-31 MED ORDER — BUPIVACAINE HCL (PF) 0.5 % IJ SOLN
INTRAMUSCULAR | Status: DC | PRN
  Administered 2014-05-31: 2.5 mL

## 2014-05-31 MED ORDER — PROMETHAZINE HCL 25 MG/ML IJ SOLN
6.2500 mg | INTRAMUSCULAR | Status: DC | PRN
Start: 2014-05-31 — End: 2014-05-31

## 2014-05-31 MED ORDER — ONDANSETRON HCL 4 MG/2ML IJ SOLN
4.0000 mg | Freq: Four times a day (QID) | INTRAMUSCULAR | Status: DC | PRN
  Administered 2014-05-31 – 2014-06-01 (×3): 4 mg via INTRAVENOUS
  Filled 2014-05-31 (×3): qty 2

## 2014-05-31 SURGICAL SUPPLY — 40 items
BAG SPEC THK2 15X12 ZIP CLS (MISCELLANEOUS)
BAG ZIPLOCK 12X15 (MISCELLANEOUS) IMPLANT
CAPT HIP PF MOP ×1 IMPLANT
COVER PERINEAL POST (MISCELLANEOUS) ×2 IMPLANT
DRAPE C-ARM 42X120 X-RAY (DRAPES) ×2 IMPLANT
DRAPE STERI IOBAN 125X83 (DRAPES) ×2 IMPLANT
DRAPE U-SHAPE 47X51 STRL (DRAPES) ×6 IMPLANT
DRSG AQUACEL AG ADV 3.5X10 (GAUZE/BANDAGES/DRESSINGS) ×2 IMPLANT
DURAPREP 26ML APPLICATOR (WOUND CARE) ×2 IMPLANT
ELECT BLADE TIP CTD 4 INCH (ELECTRODE) ×2 IMPLANT
ELECT REM PT RETURN 9FT ADLT (ELECTROSURGICAL) ×2
ELECTRODE REM PT RTRN 9FT ADLT (ELECTROSURGICAL) ×1 IMPLANT
FACESHIELD WRAPAROUND (MASK) ×6 IMPLANT
FACESHIELD WRAPAROUND OR TEAM (MASK) ×4 IMPLANT
GLOVE BIO SURGEON STRL SZ7.5 (GLOVE) ×1 IMPLANT
GLOVE BIOGEL PI IND STRL 7.5 (GLOVE) ×1 IMPLANT
GLOVE BIOGEL PI IND STRL 8 (GLOVE) IMPLANT
GLOVE BIOGEL PI IND STRL 8.5 (GLOVE) ×1 IMPLANT
GLOVE BIOGEL PI INDICATOR 7.5 (GLOVE) ×2
GLOVE BIOGEL PI INDICATOR 8 (GLOVE) ×1
GLOVE BIOGEL PI INDICATOR 8.5 (GLOVE) ×1
GLOVE ECLIPSE 7.5 STRL STRAW (GLOVE) ×1 IMPLANT
GLOVE ECLIPSE 8.0 STRL XLNG CF (GLOVE) ×3 IMPLANT
GLOVE ORTHO TXT STRL SZ7.5 (GLOVE) ×3 IMPLANT
GOWN SPEC L3 XXLG W/TWL (GOWN DISPOSABLE) ×3 IMPLANT
GOWN STRL REUS W/TWL LRG LVL3 (GOWN DISPOSABLE) ×2 IMPLANT
GOWN STRL REUS W/TWL XL LVL3 (GOWN DISPOSABLE) ×1 IMPLANT
HOLDER FOLEY CATH W/STRAP (MISCELLANEOUS) ×2 IMPLANT
KIT BASIN OR (CUSTOM PROCEDURE TRAY) ×2 IMPLANT
LIQUID BAND (GAUZE/BANDAGES/DRESSINGS) ×2 IMPLANT
PACK TOTAL JOINT (CUSTOM PROCEDURE TRAY) ×2 IMPLANT
SAW OSC TIP CART 19.5X105X1.3 (SAW) ×2 IMPLANT
SUT MNCRL AB 4-0 PS2 18 (SUTURE) ×2 IMPLANT
SUT VIC AB 1 CT1 36 (SUTURE) ×6 IMPLANT
SUT VIC AB 2-0 CT1 27 (SUTURE) ×4
SUT VIC AB 2-0 CT1 TAPERPNT 27 (SUTURE) ×2 IMPLANT
SUT VLOC 180 0 24IN GS25 (SUTURE) ×2 IMPLANT
TOWEL OR 17X26 10 PK STRL BLUE (TOWEL DISPOSABLE) ×2 IMPLANT
TRAY FOLEY CATH 14FRSI W/METER (CATHETERS) ×2 IMPLANT
WATER STERILE IRR 1500ML POUR (IV SOLUTION) ×2 IMPLANT

## 2014-05-31 NOTE — Op Note (Addendum)
NAME:  Amber Park                ACCOUNT NO.: [id number]      MEDICAL RECORD NO.: [id number]      FACILITY:  [facility]      PHYSICIAN:  Paralee Cancel D  DATE OF BIRTH:  Apr 14, 1925     DATE OF PROCEDURE:  05/31/2014                                 OPERATIVE REPORT         PREOPERATIVE DIAGNOSIS: Left  hip osteoarthritis.      POSTOPERATIVE DIAGNOSIS:  Left hip osteoarthritis.      PROCEDURE:  Left total hip replacement through an anterior approach   utilizing DePuy THR system, component size 48mm pinnacle cup, a size 32+4 neutral   Altrex liner, a size 4 Hi Tri Lock stem with a 32+1 Articulze metal head ball.      SURGEON:  Pietro Cassis. Alvan Dame, M.D.      ASSISTANT:  Danae Orleans, PA-C     ANESTHESIA:  Spinal.      SPECIMENS:  None.      COMPLICATIONS:  None.      BLOOD LOSS:  325 cc     DRAINS:  None.      INDICATION OF THE PROCEDURE:  Amber Park is a 80 y.o. female who had   presented to office for evaluation of left hip pain.  Radiographs revealed   progressive degenerative changes with bone-on-bone   articulation to the  hip joint.  The patient had painful limited range of   motion significantly affecting their overall quality of life.  The patient was failing to    respond to conservative measures, and at this point was ready   to proceed with more definitive measures.  The patient has noted progressive   degenerative changes in his hip, progressive problems and dysfunction   with regarding the hip prior to surgery.  Consent was obtained for   benefit of pain relief.  Specific risk of infection, DVT, component   failure, dislocation, need for revision surgery, as well discussion of   the anterior versus posterior approach were reviewed.  Consent was   obtained for benefit of anterior pain relief through an anterior   approach.      PROCEDURE IN DETAIL:  The patient was brought to operative theater.   Once adequate anesthesia,  preoperative antibiotics, 2gm of Ancef administered.   The patient was positioned supine on the OSI Hanna table.  Once adequate   padding of boney process was carried out, we had predraped out the hip, and  used fluoroscopy to confirm orientation of the pelvis and position.      The left hip was then prepped and draped from proximal iliac crest to   mid thigh with shower curtain technique.      Time-out was performed identifying the patient, planned procedure, and   extremity.     An incision was then made 2 cm distal and lateral to the   anterior superior iliac spine extending over the orientation of the   tensor fascia lata muscle and sharp dissection was carried down to the   fascia of the muscle and protractor placed in the soft tissues.      The fascia was then incised.  The muscle belly was identified and swept  laterally and retractor placed along the superior neck.  Following   cauterization of the circumflex vessels and removing some pericapsular   fat, a second cobra retractor was placed on the inferior neck.  A third   retractor was placed on the anterior acetabulum after elevating the   anterior rectus.  A L-capsulotomy was along the line of the   superior neck to the trochanteric fossa, then extended proximally and   distally.  Tag sutures were placed and the retractors were then placed   intracapsular.  We then identified the trochanteric fossa and   orientation of my neck cut, confirmed this radiographically   and then made a neck osteotomy with the femur on traction.  The femoral   head was removed without difficulty or complication.  Traction was let   off and retractors were placed posterior and anterior around the   acetabulum.      The labrum and foveal tissue were debrided.  I began reaming with a 40mm   reamer and reamed up to 16mm reamer with good bony bed preparation and a 1mm   cup was chosen.  The final 14mm Pinnacle cup was then impacted under fluoroscopy   to confirm the depth of penetration and orientation with respect to   abduction.  A screw was placed followed by the hole eliminator.  The final   32+4 neutral Altrex liner was impacted with good visualized rim fit.  The cup was positioned anatomically within the acetabular portion of the pelvis.      At this point, the femur was rolled at 80 degrees.  Further capsule was   released off the inferior aspect of the femoral neck.  I then   released the superior capsule proximally.  The hook was placed laterally   along the femur and elevated manually and held in position with the bed   hook.  The leg was then extended and adducted with the leg rolled to 100   degrees of external rotation.  Once the proximal femur was fully   exposed, I used a box osteotome to set orientation.  I then began   broaching with the starting chili pepper broach and passed this by hand and then broached up to 4.  With the 4 broach in place I chose a high offset neck and did a trial reduction.  The offset was appropriate, leg lengths   appeared to be equal, confirmed radiographically.   Given these findings, I went ahead and dislocated the hip, repositioned all   retractors and positioned the right hip in the extended and abducted position.  The final 4 Hi Tri Lock stem was   chosen and it was impacted down to the level of neck cut.  Based on this   and the trial reduction, a 32+1 Articuleze metal head ball was chosen and   impacted onto a clean and dry trunnion, and the hip was reduced.  The   hip had been irrigated throughout the case again at this point.  I did   reapproximate the superior capsular leaflet to the anterior leaflet   using #1 Vicryl.  The fascia of the   tensor fascia lata muscle was then reapproximated using #1 Vicryl and #0 V-lock sutures.  The   remaining wound was closed with 2-0 Vicryl and running 4-0 Monocryl.   The hip was cleaned, dried, and dressed sterilely using Dermabond and   Aquacel  dressing.  She was then brought   to recovery  room in stable condition tolerating the procedure well.    Danae Orleans, PA-C was present for the entirety of the case involved from   preoperative positioning, perioperative retractor management, general   facilitation of the case, as well as primary wound closure as assistant.            Pietro Cassis Alvan Dame, M.D.        05/31/2014 11:19 AM

## 2014-05-31 NOTE — Interval H&P Note (Signed)
History and Physical Interval Note:  05/31/2014 8:45 AM  Amber Park  has presented today for surgery, with the diagnosis of LEFT HIP OA  The various methods of treatment have been discussed with the patient and family. After consideration of risks, benefits and other options for treatment, the patient has consented to  Procedure(s): LEFT TOTAL HIP ARTHROPLASTY ANTERIOR APPROACH (Left) as a surgical intervention .  The patient's history has been reviewed, patient examined, no change in status, stable for surgery.  I have reviewed the patient's chart and labs.  Questions were answered to the patient's satisfaction.     Mauri Pole

## 2014-05-31 NOTE — Transfer of Care (Signed)
Immediate Anesthesia Transfer of Care Note  Patient: Amber Park  Procedure(s) Performed: Procedure(s): LEFT TOTAL HIP ARTHROPLASTY ANTERIOR APPROACH (Left)  Patient Location: PACU  Anesthesia Type:Regional and Spinal  Level of Consciousness: awake, sedated and patient cooperative  Airway & Oxygen Therapy: Patient Spontanous Breathing and Patient connected to face mask oxygen  Post-op Assessment: Report given to PACU RN and Post -op Vital signs reviewed and stable  Post vital signs: Reviewed and stable  Complications: No apparent anesthesia complications

## 2014-05-31 NOTE — Anesthesia Procedure Notes (Signed)
Spinal  Patient location during procedure: OR Staffing Anesthesiologist: Laurrie Toppin R Performed by: anesthesiologist  Preanesthetic Checklist Completed: patient identified, site marked, surgical consent, pre-op evaluation, timeout performed, IV checked, risks and benefits discussed and monitors and equipment checked Spinal Block Patient position: sitting Prep: Betadine Patient monitoring: heart rate, continuous pulse ox and blood pressure Approach: right paramedian Location: L3-4 Injection technique: single-shot Needle Needle type: Spinocan  Needle gauge: 22 G Needle length: 9 cm Assessment Sensory level: T8 Additional Notes Expiration date of kit checked and confirmed. Patient tolerated procedure well, without complications.     

## 2014-05-31 NOTE — Evaluation (Signed)
Physical Therapy Evaluation Patient Details Name: Amber Park MRN: [id number] DOB: Sep 28, 1924 Today's Date: 05/31/2014   History of Present Illness  Amber Park  Clinical Impression  Evaluation limited as patient was beginning to have some relief of [ppain after repositioning. RN ghad dangled patient recently. Patient will benefit from PT to address problems listed in note below.    Follow Up Recommendations SNF;Supervision/Assistance - 24 hour    Equipment Recommendations       Recommendations for Other Services       Precautions / Restrictions Precautions Precautions: Fall      Mobility  Bed Mobility Overal bed mobility: Needs Assistance;+ 2 for safety/equipment;+2 for physical assistance Bed Mobility: Rolling Rolling: Max assist;+2 for safety/equipment         General bed mobility comments: repositioned to Amber side, RN sat pt at edge of bed recentl;y, patient felt relief with repositioning and pain meds and did not want to try to get up.  Transfers                 General transfer comment: attempts to sit up increased pain so repositioned as abobve.  Ambulation/Gait                Stairs            Wheelchair Mobility    Modified Rankin (Stroke Patients Only)       Balance                                             Pertinent Vitals/Pain Pain Assessment: 0-10 Pain Score: 8  Pain Descriptors / Indicators: Discomfort;Grimacing;Aching;Constant Pain Intervention(s): Monitored during session;Premedicated before session;Limited activity within patient's tolerance;Ice applied;Repositioned    Home Living Family/patient expects to be discharged to:: Skilled nursing facility Living Arrangements: Alone                    Prior Function Level of Independence: Independent               Hand Dominance        Extremity/Trunk Assessment               Lower Extremity Assessment: LLE deficits/detail    LLE Deficits / Details: limited by discomfort in  attempts to sit up.     Communication   Communication: No difficulties  Cognition Arousal/Alertness: Awake/alert Behavior During Therapy: Anxious;WFL for tasks assessed/performed Overall Cognitive Status: Within Functional Limits for tasks assessed                      General Comments      Exercises        Assessment/Plan    PT Assessment Patient needs continued PT services  PT Diagnosis Difficulty walking;Acute pain   PT Problem List Decreased strength;Decreased range of motion;Decreased activity tolerance;Decreased mobility;Decreased safety awareness;Decreased knowledge of precautions;Decreased knowledge of use of DME;Pain  PT Treatment Interventions DME instruction;Gait training;Functional mobility training;Therapeutic activities;Therapeutic exercise;Patient/family education   PT Goals (Current goals can be found in the Care Plan section) Acute Rehab PT Goals Patient Stated Goal: to get pain relief PT Goal Formulation: With patient/family Time For Goal Achievement: 06/07/14 Potential to Achieve Goals: Good    Frequency 7X/week   Barriers to discharge        Co-evaluation  End of Session   Activity Tolerance: Patient limited by pain Patient left: in bed;with call bell/phone within reach;with family/visitor present Nurse Communication: Mobility status         Time: 1700-1716 PT Time Calculation (min) (ACUTE ONLY): 16 min   Charges:   PT Evaluation $Initial PT Evaluation Tier I: 1 Procedure PT Treatments $Therapeutic Activity: 8-22 mins   PT G Codes:          Amber Park 05/31/2014, 6:16 PM

## 2014-05-31 NOTE — Care Management Note (Addendum)
    Page 1 of 1   06/01/2014     8:26:16 AM CARE MANAGEMENT NOTE 06/01/2014  Patient:  Amber Park, Amber Park   Account Number:  [id number]  Date Initiated:  05/31/2014  Documentation initiated by:  [facility]  Subjective/Objective Assessment:   adm: LEFT TOTAL HIP ARTHROPLASTY ANTERIOR APPROACH (Left)     Action/Plan:   discharge to SNF for rehab   Anticipated DC Date:  06/01/2014   Anticipated DC Plan:  [place]  CM consult      Choice offered to / List presented to:             Status of service:  Completed, signed off Medicare Important Message given?   (If response is "NO", the following Medicare IM given date fields will be blank) Date Medicare IM given:   Medicare IM given by:   Date Additional Medicare IM given:   Additional Medicare IM given by:    Discharge Disposition:  [place]  Per UR Regulation:    If discussed at Long Length of Stay Meetings, dates discussed:    Comments:  05/31/14 14:35 CM met with pt in room to confirm pt goig to SNF for rehab.  Pt shates she is going to [facility]; [place] arranging.  No other CM needs were communicated.  Mariane Masters, BSN, CM [phone].

## 2014-05-31 NOTE — Anesthesia Preprocedure Evaluation (Addendum)
Anesthesia Evaluation  Patient identified by MRN, date of birth, ID band Patient awake    Reviewed: Allergy & Precautions, H&P , NPO status , Patient's Chart, lab work & pertinent test results  History of Anesthesia Complications (+) PONV and history of anesthetic complications  Airway Mallampati: II  TM Distance: >3 FB Neck ROM: Full    Dental no notable dental hx.    Pulmonary shortness of breath, pneumonia -, former smoker,  breath sounds clear to auscultation  Pulmonary exam normal       Cardiovascular Rhythm:Regular Rate:Normal  LBBB   Neuro/Psych PSYCHIATRIC DISORDERS Anxiety negative neurological ROS  negative psych ROS   GI/Hepatic Neg liver ROS, GERD-  ,  Endo/Other  negative endocrine ROS  Renal/GU negative Renal ROS     Musculoskeletal negative musculoskeletal ROS (+) Arthritis -,   Abdominal   Peds  Hematology  (+) anemia ,   Anesthesia Other Findings   Reproductive/Obstetrics negative OB ROS                         Anesthesia Physical Anesthesia Plan  ASA: III  Anesthesia Plan: Spinal   Post-op Pain Management:    Induction:   Airway Management Planned:   Additional Equipment: None  Intra-op Plan:   Post-operative Plan:   Informed Consent: I have reviewed the patients History and Physical, chart, labs and discussed the procedure including the risks, benefits and alternatives for the proposed anesthesia with the patient or authorized representative who has indicated his/her understanding and acceptance.   Dental advisory given  Plan Discussed with: CRNA  Anesthesia Plan Comments:         Anesthesia Quick Evaluation

## 2014-05-31 NOTE — Plan of Care (Signed)
Problem: Phase I Progression Outcomes Goal: CMS/Neurovascular status WDL Outcome: Completed/Met Date Met:  05/31/14 Goal: Pain controlled with appropriate interventions Outcome: Progressing Goal: Dangle or out of bed evening of surgery Outcome: Completed/Met Date Met:  05/31/14 Goal: Initial discharge plan identified Outcome: Completed/Met Date Met:  05/31/14 Goal: Hemodynamically stable Outcome: Completed/Met Date Met:  05/31/14

## 2014-05-31 NOTE — Anesthesia Postprocedure Evaluation (Signed)
Anesthesia Post Note  Patient: Amber Park  Procedure(s) Performed: Procedure(s) (LRB): LEFT TOTAL HIP ARTHROPLASTY ANTERIOR APPROACH (Left)  Anesthesia type: Spinal  Patient location: PACU  Post pain: Pain level controlled  Post assessment: Post-op Vital signs reviewed  Last Vitals: BP 147/55 mmHg  Pulse 69  Temp(Src) 36.3 C (Oral)  Resp 17  Ht 5\' 2"  (1.575 m)  Wt 150 lb (68.04 kg)  BMI 27.43 kg/m2  SpO2 99%  Post vital signs: Reviewed  Level of consciousness: sedated  Complications: No apparent anesthesia complications

## 2014-05-31 NOTE — Progress Notes (Signed)
Utilization review completed.  

## 2014-06-01 ENCOUNTER — Encounter (HOSPITAL_COMMUNITY): Payer: Self-pay | Admitting: Orthopedic Surgery

## 2014-06-01 DIAGNOSIS — E663 Overweight: Secondary | ICD-10-CM | POA: Diagnosis present

## 2014-06-01 LAB — CBC
HCT: 27.7 % — ABNORMAL LOW (ref 36.0–46.0)
Hemoglobin: 8.5 g/dL — ABNORMAL LOW (ref 12.0–15.0)
MCH: 24.4 pg — ABNORMAL LOW (ref 26.0–34.0)
MCHC: 30.7 g/dL (ref 30.0–36.0)
MCV: 79.4 fL (ref 78.0–100.0)
Platelets: 274 10*3/uL (ref 150–400)
RBC: 3.49 MIL/uL — ABNORMAL LOW (ref 3.87–5.11)
RDW: 17.9 % — AB (ref 11.5–15.5)
WBC: 9 10*3/uL (ref 4.0–10.5)

## 2014-06-01 LAB — BASIC METABOLIC PANEL
Anion gap: 9 (ref 5–15)
BUN: 10 mg/dL (ref 6–23)
CO2: 23 mEq/L (ref 19–32)
CREATININE: 0.9 mg/dL (ref 0.50–1.10)
Calcium: 8.4 mg/dL (ref 8.4–10.5)
Chloride: 101 mEq/L (ref 96–112)
GFR calc Af Amer: 65 mL/min — ABNORMAL LOW (ref >90)
GFR calc non Af Amer: 56 mL/min — ABNORMAL LOW (ref >90)
Glucose, Bld: 107 mg/dL — ABNORMAL HIGH (ref 70–99)
POTASSIUM: 4.7 meq/L (ref 3.7–5.3)
Sodium: 133 mEq/L — ABNORMAL LOW (ref 137–147)

## 2014-06-01 NOTE — Evaluation (Signed)
Occupational Therapy Evaluation Patient Details Name: Amber Park MRN: [id number] DOB: July 04, 1925 Today's Date: 06/01/2014    History of Present Illness [name]   Clinical Impression   This 80 year old female was admitted for the above surgery.  She will benefit from skilled OT to increase safety and independence with adls.  Goals in acute setting are from supervision to min A. She currently needs mod to max A for LB ADLs.  Pt was mod I to I prior to admission.    Follow Up Recommendations  SNF    Equipment Recommendations  3 in 1 bedside comode    Recommendations for Other Services       Precautions / Restrictions Precautions Precautions: Fall Restrictions Weight Bearing Restrictions: No      Mobility Bed Mobility   Bed Mobility: Supine to Sit Rolling: Min assist         General bed mobility comments: assist for LLE, HOB raised  Transfers Overall transfer level: Needs assistance Equipment used: Rolling walker (2 wheeled) Transfers: Sit to/from [facility] Sit to Stand: Min assist Stand pivot transfers: Min assist       General transfer comment: cues for sequence/ UE/LE placement    Balance Overall balance assessment: Needs assistance             Standing balance comment: min A for static standing; slight posterior lean when taking BP                            ADL Overall ADL's : Needs assistance/impaired     Grooming: Set up;Sitting   Upper Body Bathing: Set up;Sitting   Lower Body Bathing: Moderate assistance;Sit to/from stand   Upper Body Dressing : Set up;Sitting   Lower Body Dressing: Maximal assistance;Sit to/from stand   Toilet Transfer: Minimal assistance;Stand-pivot (to recliner)             General ADL Comments: pt has catheter.  Educated on AE but pt did not use this session.  Initial sitting BP 150/48 standing 139/59.  Pt initially felt a little lightheaded     Vision                      Perception     Praxis      Pertinent Vitals/Pain Pain Assessment: 0-10 Pain Score: 4  Pain Location: L hip Pain Descriptors / Indicators: Aching Pain Intervention(s): Limited activity within patient's tolerance;Monitored during session;Premedicated before session;Repositioned;Ice applied     Hand Dominance     Extremity/Trunk Assessment Upper Extremity Assessment Upper Extremity Assessment: Overall WFL for tasks assessed           Communication Communication Communication: No difficulties   Cognition Arousal/Alertness: Awake/alert Behavior During Therapy: WFL for tasks assessed/performed Overall Cognitive Status: Within Functional Limits for tasks assessed                     General Comments       Exercises       Shoulder Instructions      Home Living Family/patient expects to be discharged to:: Skilled nursing facility                                        Prior Functioning/Environment Level of Independence: Independent        Comments: uses  stool to get socks and shoes on    OT Diagnosis: Generalized weakness   OT Problem List: Decreased strength;Decreased activity tolerance;Impaired balance (sitting and/or standing);Decreased knowledge of use of DME or AE;Pain   OT Treatment/Interventions: Self-care/ADL training;DME and/or AE instruction;Patient/family education;Balance training    OT Goals(Current goals can be found in the care plan section) Acute Rehab OT Goals Patient Stated Goal: rehab then home OT Goal Formulation: With patient Time For Goal Achievement: 06/08/14 Potential to Achieve Goals: Good ADL Goals Pt Will Perform Grooming: with supervision;standing Pt Will Perform Lower Body Bathing: with min assist;with adaptive equipment;sit to/from stand Pt Will Perform Lower Body Dressing: with min assist;with adaptive equipment;sit to/from stand Pt Will Transfer to Toilet: with min guard  assist;ambulating;bedside commode Pt Will Perform Toileting - Clothing Manipulation and hygiene: with supervision;sit to/from stand  OT Frequency: Min 2X/week   Barriers to D/C:            Co-evaluation              End of Session    Activity Tolerance: Patient tolerated treatment well Patient left: in chair;with call bell/phone within reach;with family/visitor present   Time: 1025-1056 OT Time Calculation (min): 31 min Charges:  OT General Charges $OT Visit: 1 Procedure OT Evaluation $Initial OT Evaluation Tier I: 1 Procedure OT Treatments $Self Care/Home Management : 23-37 mins G-Codes:    Temesgen Weightman 06/11/14, 11:14 AM  Lesle Chris, OTR/L [phone] 2014/06/11

## 2014-06-01 NOTE — Plan of Care (Signed)
Problem: Phase II Progression Outcomes Goal: Ambulates Outcome: Completed/Met Date Met:  06/01/14     

## 2014-06-01 NOTE — Progress Notes (Signed)
Clinical Social Work Department CLINICAL SOCIAL WORK PLACEMENT NOTE 06/01/2014  Patient:  Amber Park, Amber Park  Account Number:  [id number] Admit date:  05/31/2014  Clinical Social Worker:  Werner Lean, LCSW  Date/time:  06/01/2014 01:23 PM  Clinical Social Work is seeking post-discharge placement for this patient at the following level of care:   SKILLED NURSING   (*CSW will update this form in Epic as items are completed)     Patient/family provided with [place] Department of Clinical Social Work's list of facilities offering this level of care within the geographic area requested by the patient (or if unable, by the patient's family).  06/01/2014  Patient/family informed of their freedom to choose among providers that offer the needed level of care, that participate in Medicare, Medicaid or managed care program needed by the patient, have an available bed and are willing to accept the patient.    Patient/family informed of [facility]' ownership interest in [facility], as well as of the fact that they are under no obligation to receive care at this facility.  PASARR submitted to EDS on 06/01/2014 PASARR number received on 06/01/2014  FL2 transmitted to all facilities in geographic area requested by pt/family on  06/01/2014 FL2 transmitted to all facilities within larger geographic area on   Patient informed that his/her managed care company has contracts with or will negotiate with  certain facilities, including the following:     Patient/family informed of bed offers received:  06/01/2014 Patient chooses bed at [facility] Physician recommends and patient chooses bed at    Patient to be transferred to  on   Patient to be transferred to facility by  Patient and family notified of transfer on  Name of family member notified:    The following physician request were entered in Epic:   Additional Comments:  Werner Lean LCSW [phone]

## 2014-06-01 NOTE — Progress Notes (Signed)
Clinical Social Work Department BRIEF PSYCHOSOCIAL ASSESSMENT 06/01/2014  Patient:  Amber Park, Amber Park     Account Number:  [id number]     Admit date:  05/31/2014  Clinical Social Worker:  Lacie Scotts  Date/Time:  06/01/2014 12:13 PM  Referred by:  Physician  Date Referred:  06/01/2014 Referred for  SNF Placement   Other Referral:   Interview type:  Patient Other interview type:    PSYCHOSOCIAL DATA Living Status:  HUSBAND Admitted from facility:   Level of care:   Primary support name:  Herbie Baltimore Primary support relationship to patient:  SPOUSE Degree of support available:   good    CURRENT CONCERNS Current Concerns  Post-Acute Placement   Other Concerns:    SOCIAL WORK ASSESSMENT / PLAN Pt is an 80 yr old female living at home prior to hospitalization. CSW met with pt / daughter  to assist with d/c planning. Pt has made prior arrangements to have ST Rehab at [facility] following hospital d/c. CSW has contacted SNF and d/c plans have been confirmed. Pt has [employer] which requires prior authorization. CSW has submitted available  clinicals to insurance and a decision is pending.   Assessment/plan status:  Psychosocial Support/Ongoing Assessment of Needs Other assessment/ plan:   Information/referral to community resources:   Insurance coverage for SNF and ambulance reviewed.    PATIENT'S/FAMILY'S RESPONSE TO PLAN OF CARE: Pt's mood is bright. She is motivated to work with therapy and is looking forward to having rehab at [facility] [phone]

## 2014-06-01 NOTE — Progress Notes (Signed)
Physical Therapy Treatment Patient Details Name: MITZI LILJA MRN: [id number] DOB: 26-Aug-1924 Today's Date: 06/01/2014    History of Present Illness [name]    PT Comments    Progressing well, no dizziness. Minimal soreness.  Follow Up Recommendations  SNF;Supervision/Assistance - 24 hour     Equipment Recommendations  None recommended by PT    Recommendations for Other Services       Precautions / Restrictions Precautions Precautions: Fall Restrictions Weight Bearing Restrictions: No    Mobility  Bed Mobility   Bed Mobility: Sit to Supine Rolling: Min assist     Sit to supine: Min assist   General bed mobility comments: assist for LLE,   Transfers Overall transfer level: Needs assistance Equipment used: Rolling walker (2 wheeled) Transfers: Sit to/from Stand Sit to Stand: Min assist Stand pivot transfers: Min assist       General transfer comment: cues for sequence/ UE/LE placement  Ambulation/Gait Ambulation/Gait assistance: Min assist Ambulation Distance (Feet): 150 Feet Assistive device: Rolling walker (2 wheeled) Gait Pattern/deviations: Step-through pattern     General Gait Details: cues for sequence and posture   Stairs            Wheelchair Mobility    Modified Rankin (Stroke Patients Only)       Balance Overall balance assessment: Needs assistance             Standing balance comment: min A for static standing; slight posterior lean when taking BP                    Cognition Arousal/Alertness: Awake/alert Behavior During Therapy: WFL for tasks assessed/performed Overall Cognitive Status: Within Functional Limits for tasks assessed                      Exercises Total Joint Exercises Ankle Circles/Pumps: AROM;Both;10 reps;Supine Quad Sets: AROM;Both;10 reps;Supine Short Arc Quad: AROM;Supine;Left;10 reps Heel Slides: AAROM;Supine;Left Hip ABduction/ADduction: AAROM;Supine;Left;10 reps     General Comments        Pertinent Vitals/Pain Pain Assessment: 0-10 Pain Score: 1  Pain Location: L hip Pain Descriptors / Indicators: Sore Pain Intervention(s): Ice applied    Home Living Family/patient expects to be discharged to:: Skilled nursing facility                    Prior Function Level of Independence: Independent      Comments: uses stool to get socks and shoes on   PT Goals (current goals can now be found in the care plan section) Acute Rehab PT Goals Patient Stated Goal: rehab then home Progress towards PT goals: Progressing toward goals    Frequency  7X/week    PT Plan Current plan remains appropriate    Co-evaluation             End of Session   Activity Tolerance: Patient tolerated treatment well Patient left: in bed;with call bell/phone within reach;with family/visitor present     Time: 1355-1426 PT Time Calculation (min) (ACUTE ONLY): 31 min  Charges:  $Gait Training: 8-22 mins $Therapeutic Exercise: 8-22 mins                    G Codes:      Claretha Cooper 06/01/2014, 2:59 PM

## 2014-06-01 NOTE — Plan of Care (Signed)
Problem: Phase II Progression Outcomes Goal: Tolerating diet Outcome: Completed/Met Date Met:  06/01/14

## 2014-06-01 NOTE — Progress Notes (Signed)
     Subjective: 1 Day Post-Op Procedure(s) (LRB): LEFT TOTAL HIP ARTHROPLASTY ANTERIOR APPROACH (Left)   Patient reports pain as mild, pain controlled. Little bit of increased pain yesterday, but better controlled now. No events throughout the night. Nice discussion was had with the family and patient regarding the procedure and expectations post-op.  Very happy with how she feels after the spinal anesthesia vs how she has felt previously with general anesthesia.  Objective:   VITALS:   Filed Vitals:   06/01/14 0605  BP: 155/59  Pulse: 74  Temp: 97.6 F (36.4 C)  Resp: 16    Dorsiflexion/Plantar flexion intact Incision: dressing C/D/I No cellulitis present Compartment soft  LABS  Recent Labs  06/01/14 0502  HGB 8.5*  HCT 27.7*  WBC 9.0  PLT 274     Recent Labs  06/01/14 0502  NA 133*  K 4.7  BUN 10  CREATININE 0.90  GLUCOSE 107*     Assessment/Plan: 1 Day Post-Op Procedure(s) (LRB): LEFT TOTAL HIP ARTHROPLASTY ANTERIOR APPROACH (Left) Foley cath d/c'ed Advance diet Up with therapy D/C IV fluids Discharge to SNF eventually, when ready   Overweight (BMI 25-29.9) Estimated body mass index is 27.43 kg/(m^2) as calculated from the following:   Height as of this encounter: 5\' 2"  (1.575 m).   Weight as of this encounter: 68.04 kg (150 lb). Patient also counseled that weight may inhibit the healing process Patient counseled that losing weight will help with future health issues      [place] Pugh. Kache Mcclurg   PAC  06/01/2014, 8:54 AM

## 2014-06-01 NOTE — Progress Notes (Signed)
Physical Therapy Treatment Patient Details Name: NILZA EAKER MRN: [id number] DOB: 01/23/1925 Today's Date: 06/01/2014    History of Present Illness [name]    PT Comments    Feeling much better, not the pain of yesterday.   Follow Up Recommendations  SNF;Supervision/Assistance - 24 hour     Equipment Recommendations  None recommended by PT    Recommendations for Other Services       Precautions / Restrictions Precautions Precautions: Fall Restrictions Weight Bearing Restrictions: No    Mobility  Bed Mobility   Bed Mobility: Supine to Sit Rolling: Min assist         General bed mobility comments: assist for LLE, HOB raised  Transfers Overall transfer level: Needs assistance Equipment used: Rolling walker (2 wheeled) Transfers: Sit to/from Stand Sit to Stand: Min assist Stand pivot transfers: Min assist       General transfer comment: cues for sequence/ UE/LE placement  Ambulation/Gait Ambulation/Gait assistance: Min assist Ambulation Distance (Feet): 80 Feet Assistive device: Rolling walker (2 wheeled) Gait Pattern/deviations: Step-through pattern     General Gait Details: cues for sequence and posture   Stairs            Wheelchair Mobility    Modified Rankin (Stroke Patients Only)       Balance Overall balance assessment: Needs assistance             Standing balance comment: min A for static standing; slight posterior lean when taking BP                    Cognition Arousal/Alertness: Awake/alert Behavior During Therapy: WFL for tasks assessed/performed Overall Cognitive Status: Within Functional Limits for tasks assessed                      Exercises      General Comments        Pertinent Vitals/Pain Pain Assessment: 0-10 Pain Score: 1  Pain Location: l hip Pain Descriptors / Indicators: Sore Pain Intervention(s): Premedicated before session;Ice applied    Home Living Family/patient  expects to be discharged to:: Skilled nursing facility                    Prior Function Level of Independence: Independent      Comments: uses stool to get socks and shoes on   PT Goals (current goals can now be found in the care plan section) Acute Rehab PT Goals Patient Stated Goal: rehab then home Progress towards PT goals: Progressing toward goals    Frequency  7X/week    PT Plan Current plan remains appropriate    Co-evaluation             End of Session   Activity Tolerance: Patient tolerated treatment well Patient left: in chair;with call bell/phone within reach;with family/visitor present     Time: 1505-6979 PT Time Calculation (min) (ACUTE ONLY): 19 min  Charges:  $Gait Training: 8-22 mins                    G Codes:      Claretha Cooper 06/01/2014, 2:56 PM

## 2014-06-02 LAB — BASIC METABOLIC PANEL
Anion gap: 11 (ref 5–15)
BUN: 16 mg/dL (ref 6–23)
CO2: 26 mEq/L (ref 19–32)
Calcium: 8.9 mg/dL (ref 8.4–10.5)
Chloride: 98 mEq/L (ref 96–112)
Creatinine, Ser: 0.97 mg/dL (ref 0.50–1.10)
GFR, EST AFRICAN AMERICAN: 59 mL/min — AB (ref >90)
GFR, EST NON AFRICAN AMERICAN: 51 mL/min — AB (ref >90)
GLUCOSE: 109 mg/dL — AB (ref 70–99)
POTASSIUM: 4.4 meq/L (ref 3.7–5.3)
Sodium: 135 mEq/L — ABNORMAL LOW (ref 137–147)

## 2014-06-02 LAB — CBC
HCT: 26.9 % — ABNORMAL LOW (ref 36.0–46.0)
Hemoglobin: 8.2 g/dL — ABNORMAL LOW (ref 12.0–15.0)
MCH: 24.6 pg — ABNORMAL LOW (ref 26.0–34.0)
MCHC: 30.5 g/dL (ref 30.0–36.0)
MCV: 80.5 fL (ref 78.0–100.0)
Platelets: 260 10*3/uL (ref 150–400)
RBC: 3.34 MIL/uL — ABNORMAL LOW (ref 3.87–5.11)
RDW: 18.3 % — ABNORMAL HIGH (ref 11.5–15.5)
WBC: 10.3 10*3/uL (ref 4.0–10.5)

## 2014-06-02 MED ORDER — FERROUS SULFATE 325 (65 FE) MG PO TABS: 325.0000 mg | ORAL_TABLET | Freq: Three times a day (TID) | ORAL | Status: DC

## 2014-06-02 MED ORDER — ASPIRIN 325 MG PO TBEC: 325.0000 mg | DELAYED_RELEASE_TABLET | Freq: Two times a day (BID) | ORAL | Status: AC

## 2014-06-02 MED ORDER — TRAMADOL HCL 50 MG PO TABS: 50.0000 mg | ORAL_TABLET | Freq: Four times a day (QID) | ORAL | Status: DC | PRN

## 2014-06-02 MED ORDER — DSS 100 MG PO CAPS
100.0000 mg | ORAL_CAPSULE | Freq: Two times a day (BID) | ORAL | Status: DC
Start: 2014-06-02 — End: 2018-09-14

## 2014-06-02 MED ORDER — TIZANIDINE HCL 4 MG PO TABS: 4.0000 mg | ORAL_TABLET | Freq: Three times a day (TID) | ORAL | Status: AC | PRN

## 2014-06-02 MED ORDER — ACETAMINOPHEN 325 MG PO TABS: 650.0000 mg | ORAL_TABLET | Freq: Four times a day (QID) | ORAL | Status: DC | PRN

## 2014-06-02 MED ORDER — POLYETHYLENE GLYCOL 3350 17 G PO PACK: 17.0000 g | PACK | Freq: Two times a day (BID) | ORAL | Status: DC

## 2014-06-02 NOTE — Progress Notes (Signed)
Clinical Social Work Department CLINICAL SOCIAL WORK PLACEMENT NOTE 06/02/2014  Patient:  Amber Park, Amber Park  Account Number:  [id number] Admit date:  05/31/2014  Clinical Social Worker:  Werner Lean, LCSW  Date/time:  06/01/2014 01:23 PM  Clinical Social Work is seeking post-discharge placement for this patient at the following level of care:   SKILLED NURSING   (*CSW will update this form in Epic as items are completed)     Patient/family provided with [place] Department of Clinical Social Work's list of facilities offering this level of care within the geographic area requested by the patient (or if unable, by the patient's family).  06/01/2014  Patient/family informed of their freedom to choose among providers that offer the needed level of care, that participate in Medicare, Medicaid or managed care program needed by the patient, have an available bed and are willing to accept the patient.    Patient/family informed of [facility]' ownership interest in [facility], as well as of the fact that they are under no obligation to receive care at this facility.  PASARR submitted to EDS on 06/01/2014 PASARR number received on 06/01/2014  FL2 transmitted to all facilities in geographic area requested by pt/family on  06/01/2014 FL2 transmitted to all facilities within larger geographic area on   Patient informed that his/her managed care company has contracts with or will negotiate with  certain facilities, including the following:     Patient/family informed of bed offers received:  06/01/2014 Patient chooses bed at [place] Physician recommends and patient chooses bed at    Patient to be transferred to [facility] on  06/02/2014 Patient to be transferred to facility by [facility] Patient and family notified of transfer on 06/02/2014 Name of family member notified:  SON  The following physician request were entered in  Epic:   Additional Comments: Pt / son are in agreement with d/c to SNF today. [facility] transport is required. [employer] provided authorization for SNF and ambulance transport. NSG reviewed d/c summary , scripts, avs. Scripts, were included in d/c packet.  Werner Lean LCSW [phone]

## 2014-06-02 NOTE — Progress Notes (Signed)
     Subjective: 2 Days Post-Op Procedure(s) (LRB): LEFT TOTAL HIP ARTHROPLASTY ANTERIOR APPROACH (Left)   Seen by Dr. Alvan Dame. Patient reports pain as mild, pain controlled. No events throughout the night. Ready to be discharged to skilled nursing facility.  Objective:   VITALS:   Filed Vitals:   06/02/14 0639  BP: 158/60  Pulse: 88  Temp: 97.8 F (36.6 C)  Resp: 16    Dorsiflexion/Plantar flexion intact Incision: dressing C/D/I No cellulitis present Compartment soft  LABS  Recent Labs  06/01/14 0502 06/02/14 0426  HGB 8.5* 8.2*  HCT 27.7* 26.9*  WBC 9.0 10.3  PLT 274 260     Recent Labs  06/01/14 0502 06/02/14 0426  NA 133* 135*  K 4.7 4.4  BUN 10 16  CREATININE 0.90 0.97  GLUCOSE 107* 109*     Assessment/Plan: 2 Days Post-Op Procedure(s) (LRB): LEFT TOTAL HIP ARTHROPLASTY ANTERIOR APPROACH (Left) Up with therapy Discharge to SNF  Follow up in 2 weeks at [facility]. Follow up with OLIN,Haniyyah Sakuma D in 2 weeks.  Contact information:  [facility] [address] Eiliyah Reh   PAC  06/02/2014, 7:34 AM

## 2014-06-02 NOTE — Discharge Summary (Signed)
Physician Discharge Summary  Patient ID: Amber Park MRN: [id number] DOB/AGE: Apr 01, 1925 80 y.o.  Admit date: 05/31/2014 Discharge date:  06/02/2014  Procedures:  Procedure(s) (LRB): LEFT TOTAL HIP ARTHROPLASTY ANTERIOR APPROACH (Left)  Attending Physician:  Dr. Paralee Cancel   Admission Diagnoses:   Left hip primary OA / pain  Discharge Diagnoses:  Principal Problem:   S/P left THA, AA Active Problems:   Overweight (BMI 25.0-29.9)  Past Medical History  Diagnosis Date  . PONV (postoperative nausea and vomiting)   . Shortness of breath dyspnea     with exertion   . Pneumonia     hx of pneumonia x 4   . Anxiety   . GERD (gastroesophageal reflux disease)   . Arthritis   . Cancer     hx of skin cancer removed from nose   . History of blood transfusion 1963  . History of left bundle branch block     old ekg 06-23-11 dr Felipa Eth on chart    HPI: Amber Park, 80 y.o. female, has a history of pain and functional disability in the left hip(s) due to arthritis and patient has failed non-surgical conservative treatments for greater than 12 weeks to include NSAID's and/or analgesics, corticosteriod injections and activity modification. Onset of symptoms was gradual starting 2+ years ago with gradually worsening course since that time.The patient noted no past surgery on the left hip(s). Patient currently rates pain in the left hip at 9 out of 10 with activity. Patient has night pain, worsening of pain with activity and weight bearing, trendelenberg gait, pain that interfers with activities of daily living and pain with passive range of motion. Patient has evidence of periarticular osteophytes and joint space narrowing by imaging studies. This condition presents safety issues increasing the risk of falls. There is no current active infection. Risks, benefits and expectations were discussed with the patient. Risks including but not limited to the risk of anesthesia, blood  clots, nerve damage, blood vessel damage, failure of the prosthesis, infection and up to and including death. Patient understand the risks, benefits and expectations and wishes to proceed with surgery.  PCP: Mathews Argyle, MD   Discharged Condition: good  Hospital Course:  Patient underwent the above stated procedure on 05/31/2014. Patient tolerated the procedure well and brought to the recovery room in good condition and subsequently to the floor.  POD #1 BP: 155/59 ; Pulse: 74 ; Temp: 97.6 F (36.4 C) ; Resp: 16  Patient reports pain as mild, pain controlled. Little bit of increased pain yesterday, but better controlled now. No events throughout the night. Nice discussion was had with the family and patient regarding the procedure and expectations post-op. Very happy with how she feels after the spinal anesthesia vs how she has felt previously with general anesthesia. Dorsiflexion/plantar flexion intact, incision: dressing C/D/I, no cellulitis present and compartment soft.   LABS  Basename    HGB  8.5  HCT  27.7   POD #2  BP: 158/60 ; Pulse: 88 ; Temp: 97.8 F (36.6 C) ; Resp: 16 Patient reports pain as mild, pain controlled. No events throughout the night. Ready to be discharged to skilled nursing facility. Dorsiflexion/plantar flexion intact, incision: dressing C/D/I, no cellulitis present and compartment soft.   LABS  Basename    HGB  8.2  HCT  26.9    Discharge Exam: General appearance: alert, cooperative and no distress Extremities: Homans sign is negative, no sign of DVT, no edema, redness or  tenderness in the calves or thighs and no ulcers, gangrene or trophic changes  Disposition:    Skilled nursing facility with follow up in 2 weeks   Follow-up Information    Follow up with Mauri Pole, MD. Schedule an appointment as soon as possible for a visit in 2 weeks.   Specialty:  Orthopedic Surgery   Contact information:   [address] [place] [address] [phone]       Discharge Instructions    Call MD / Call 911    Complete by:  As directed   If you experience chest pain or shortness of breath, CALL 911 and be transported to the hospital emergency room.  If you develope a fever above 101 F, pus (white drainage) or increased drainage or redness at the wound, or calf pain, call your surgeon's office.     Change dressing    Complete by:  As directed   Maintain surgical dressing for 10-14 days, or until follow up in the clinic.     Constipation Prevention    Complete by:  As directed   Drink plenty of fluids.  Prune juice may be helpful.  You may use a stool softener, such as Colace (over the counter) 100 mg twice a day.  Use MiraLax (over the counter) for constipation as needed.     Diet - low sodium heart healthy    Complete by:  As directed      Discharge instructions    Complete by:  As directed   Maintain surgical dressing for 10-14 days, or until follow up in the clinic. Follow up in 2 weeks at [facility]. Call with any questions or concerns.     Increase activity slowly as tolerated    Complete by:  As directed      TED hose    Complete by:  As directed   Use stockings (TED hose) for 2 weeks on both leg(s).  You may remove them at night for sleeping.     Weight bearing as tolerated    Complete by:  As directed   Laterality:  left  Extremity:  Lower             Medication List    STOP taking these medications        ferrous fumarate 325 (106 FE) MG Tabs tablet  Commonly known as:  HEMOCYTE - 106 mg FE      TAKE these medications        acetaminophen 325 MG tablet  Commonly known as:  TYLENOL  Take 2 tablets (650 mg total) by mouth every 6 (six) hours as needed for mild pain (or Fever >/= 101).     aspirin 325 MG EC tablet  Take 1 tablet (325 mg total) by mouth 2 (two) times daily.     DSS 100 MG Caps  Take 100 mg by mouth 2 (two) times daily.     famotidine 10 MG  chewable tablet  Commonly known as:  PEPCID AC  Chew 10 mg by mouth 2 (two) times daily as needed for heartburn.     ferrous sulfate 325 (65 FE) MG tablet  Take 1 tablet (325 mg total) by mouth 3 (three) times daily after meals.     hydrochlorothiazide 25 MG tablet  Commonly known as:  HYDRODIURIL  Take 25 mg by mouth daily with lunch.     OVER THE COUNTER MEDICATION  Take 3 tablets by mouth at bedtime.  polyethylene glycol packet  Commonly known as:  MIRALAX / GLYCOLAX  Take 17 g by mouth 2 (two) times daily.     tiZANidine 4 MG tablet  Commonly known as:  ZANAFLEX  Take 1 tablet (4 mg total) by mouth every 8 (eight) hours as needed for muscle spasms.     traMADol 50 MG tablet  Commonly known as:  ULTRAM  Take 1-2 tablets (50-100 mg total) by mouth every 6 (six) hours as needed.         Signed: West Pugh. Kitti Mcclish   PA-C  06/02/2014, 7:46 AM

## 2014-06-02 NOTE — Plan of Care (Signed)
Problem: Discharge Progression Outcomes Goal: Barriers To Progression Addressed/Resolved Outcome: Not Applicable Date Met:  76/81/15 Goal: CMS/Neurovascular status at or above baseline Outcome: Completed/Met Date Met:  06/02/14 Goal: Anticoagulant follow-up in place Outcome: Completed/Met Date Met:  06/02/14 Goal: Pain controlled with appropriate interventions Outcome: Completed/Met Date Met:  06/02/14 Goal: Hemodynamically stable Outcome: Completed/Met Date Met:  72/62/03 Goal: Complications resolved/controlled Outcome: Completed/Met Date Met:  06/02/14 Goal: Tolerates diet Outcome: Completed/Met Date Met:  06/02/14 Goal: Activity appropriate for discharge plan Outcome: Completed/Met Date Met:  06/02/14 Goal: Ambulates safely using assistive device Outcome: Completed/Met Date Met:  06/02/14 Goal: Follows weight - bearing limitations Outcome: Not Applicable Date Met:  55/97/41 Goal: Discharge plan in place and appropriate Outcome: Completed/Met Date Met:  06/02/14 Goal: Negotiates stairs Outcome: Completed/Met Date Met:  06/02/14 Goal: Demonstrates ADLs as appropriate Outcome: Completed/Met Date Met:  06/02/14 Goal: Incision without S/S infection Outcome: Completed/Met Date Met:  06/02/14 Goal: Other Discharge Outcomes/Goals Outcome: Completed/Met Date Met:  06/02/14

## 2014-06-02 NOTE — Progress Notes (Signed)
Occupational Therapy Treatment Patient Details Name: SHANETTE TAMARGO MRN: [id number] DOB: 1924/09/19 Today's Date: 06/02/2014    History of present illness 80 yo female s/p L THA-DA.    OT comments  Pt progressing well  Follow Up Recommendations  SNF    Equipment Recommendations  3 in 1 bedside comode    Recommendations for Other Services      Precautions / Restrictions Precautions Precautions: Fall Restrictions Weight Bearing Restrictions: No       Mobility Bed Mobility           Sit to supine: Min assist   General bed mobility comments: light min A to support LLE  Transfers Overall transfer level: Needs assistance Equipment used: Rolling walker (2 wheeled) Transfers: Sit to/from Stand Sit to Stand: Min guard Stand pivot transfers: Min guard       General transfer comment: cues forn UE placement. close guard for safety    Balance                                   ADL       Grooming: Wash/dry hands;Supervision/safety;Standing               Lower Body Dressing: Minimal assistance;With adaptive equipment;Sit to/from stand   Toilet Transfer: Min guard;Ambulation;BSC   Toileting- Water quality scientist and Hygiene: Min guard;Sit to/from stand         General ADL Comments: ambulated to bathroom; when seated in recliner, used reacher and sock aid for LB dressing      Vision                     Perception     Praxis      Cognition   Behavior During Therapy: [facility] for tasks assessed/performed Overall Cognitive Status: Within Functional Limits for tasks assessed                       Extremity/Trunk Assessment               Exercises    Shoulder Instructions       General Comments      Pertinent Vitals/ Pain       Pain Assessment: 0-10 Pain Score: 3  Pain Location: L hip Pain Descriptors / Indicators: Sore Pain Intervention(s): Limited activity within patient's tolerance;Monitored during  session;Premedicated before session;Repositioned;Ice applied  Home Living                                          Prior Functioning/Environment              Frequency Min 2X/week     Progress Toward Goals  OT Goals(current goals can now be found in the care plan section)  Progress towards OT goals: Progressing toward goals     Plan      Co-evaluation                 End of Session     Activity Tolerance Patient tolerated treatment well   Patient Left in chair;with call bell/phone within reach;with family/visitor present   Nurse Communication          Time: 5638-7564 OT Time Calculation (min): 20 min  Charges: OT General Charges $OT Visit: 1 Procedure OT Treatments $Self Care/Home Management :  8-22 mins  Tavon Magnussen 06/02/2014, 9:42 AM  Lesle Chris, OTR/L [phone] 06/02/2014

## 2014-06-02 NOTE — Progress Notes (Signed)
Physical Therapy Treatment Patient Details Name: ANJU SERENO MRN: [id number] DOB: 07-Jul-1925 Today's Date: 06/02/2014    History of Present Illness 80 yo female s/p L THA-DA.     PT Comments    Progressing with mobility. Plan is for possible d/c to SNF on today.   Follow Up Recommendations  SNF     Equipment Recommendations  None recommended by PT    Recommendations for Other Services       Precautions / Restrictions Precautions Precautions: Fall Restrictions Weight Bearing Restrictions: No    Mobility  Bed Mobility               General bed mobility comments: OOB in recliner  Transfers Overall transfer level: Needs assistance Equipment used: Rolling walker (2 wheeled) Transfers: Sit to/from Stand Sit to Stand: Min guard Stand pivot transfers: Min guard       General transfer comment: cues for sequence/ UE/LE placement. close guard for safety  Ambulation/Gait Ambulation/Gait assistance: Min assist Ambulation Distance (Feet): 160 Feet Assistive device: Rolling walker (2 wheeled) Gait Pattern/deviations: Step-through pattern;Step-to pattern;Decreased stride length;Antalgic     General Gait Details: assist to steady intermittently. VCS safety.    Stairs            Wheelchair Mobility    Modified Rankin (Stroke Patients Only)       Balance                                    Cognition Arousal/Alertness: Awake/alert Behavior During Therapy: WFL for tasks assessed/performed Overall Cognitive Status: Within Functional Limits for tasks assessed                      Exercises Total Joint Exercises Ankle Circles/Pumps: AROM;Both;15 reps;Seated Quad Sets: AROM;Both;15 reps;Seated Heel Slides: AAROM;Left;15 reps;Seated Hip ABduction/ADduction: AAROM;AROM;Left;Seated;15 reps Long Arc Quad: AROM;Left;10 reps;Seated    General Comments        Pertinent Vitals/Pain Pain Assessment: 0-10 Pain Score: 5  Pain  Location: L hip/knee Pain Descriptors / Indicators: Sore Pain Intervention(s): Monitored during session;Ice applied    Home Living                      Prior Function            PT Goals (current goals can now be found in the care plan section) Progress towards PT goals: Progressing toward goals    Frequency  7X/week    PT Plan Current plan remains appropriate    Co-evaluation             End of Session   Activity Tolerance: Patient tolerated treatment well Patient left: in chair;with call bell/phone within reach;with family/visitor present     Time: 0962-8366 PT Time Calculation (min) (ACUTE ONLY): 17 min  Charges:  $Gait Training: 8-22 mins                    G Codes:      Weston Anna, MPT Pager: [phone]

## 2014-09-04 ENCOUNTER — Emergency Department (HOSPITAL_COMMUNITY)
Admission: EM | Admit: 2014-09-04 | Discharge: 2014-09-04 | Disposition: A | Discharge status: Home / Self Care | Payer: Medicare Other | Attending: Emergency Medicine | Admitting: Emergency Medicine

## 2014-09-04 ENCOUNTER — Emergency Department (HOSPITAL_COMMUNITY): Payer: Medicare Other

## 2014-09-04 ENCOUNTER — Encounter (HOSPITAL_COMMUNITY): Payer: Self-pay | Admitting: Emergency Medicine

## 2014-09-04 DIAGNOSIS — Z8589 Personal history of malignant neoplasm of other organs and systems: Secondary | ICD-10-CM | POA: Diagnosis not present

## 2014-09-04 DIAGNOSIS — Y9389 Activity, other specified: Secondary | ICD-10-CM | POA: Insufficient documentation

## 2014-09-04 DIAGNOSIS — W540XXA Bitten by dog, initial encounter: Secondary | ICD-10-CM | POA: Diagnosis not present

## 2014-09-04 DIAGNOSIS — S81811A Laceration without foreign body, right lower leg, initial encounter: Secondary | ICD-10-CM | POA: Diagnosis not present

## 2014-09-04 DIAGNOSIS — Z23 Encounter for immunization: Secondary | ICD-10-CM | POA: Insufficient documentation

## 2014-09-04 DIAGNOSIS — Z8701 Personal history of pneumonia (recurrent): Secondary | ICD-10-CM | POA: Insufficient documentation

## 2014-09-04 DIAGNOSIS — Y9289 Other specified places as the place of occurrence of the external cause: Secondary | ICD-10-CM | POA: Insufficient documentation

## 2014-09-04 DIAGNOSIS — M1990 Unspecified osteoarthritis, unspecified site: Secondary | ICD-10-CM | POA: Insufficient documentation

## 2014-09-04 DIAGNOSIS — Z87891 Personal history of nicotine dependence: Secondary | ICD-10-CM | POA: Diagnosis not present

## 2014-09-04 DIAGNOSIS — Z79899 Other long term (current) drug therapy: Secondary | ICD-10-CM | POA: Insufficient documentation

## 2014-09-04 DIAGNOSIS — F419 Anxiety disorder, unspecified: Secondary | ICD-10-CM | POA: Insufficient documentation

## 2014-09-04 DIAGNOSIS — S81851A Open bite, right lower leg, initial encounter: Secondary | ICD-10-CM | POA: Diagnosis present

## 2014-09-04 DIAGNOSIS — Y998 Other external cause status: Secondary | ICD-10-CM | POA: Insufficient documentation

## 2014-09-04 DIAGNOSIS — K219 Gastro-esophageal reflux disease without esophagitis: Secondary | ICD-10-CM | POA: Insufficient documentation

## 2014-09-04 MED ORDER — TETANUS-DIPHTH-ACELL PERTUSSIS 5-2.5-18.5 LF-MCG/0.5 IM SUSP
0.5000 mL | Freq: Once | INTRAMUSCULAR | Status: AC
  Administered 2014-09-04: 0.5 mL via INTRAMUSCULAR
  Filled 2014-09-04: qty 0.5

## 2014-09-04 MED ORDER — BACITRACIN ZINC 500 UNIT/GM EX OINT
TOPICAL_OINTMENT | CUTANEOUS | Status: AC
  Filled 2014-09-04: qty 0.9

## 2014-09-04 MED ORDER — AMOXICILLIN-POT CLAVULANATE 875-125 MG PO TABS: 1.0000 | ORAL_TABLET | Freq: Two times a day (BID) | ORAL | Status: DC

## 2014-09-04 NOTE — ED Provider Notes (Signed)
CSN: [id number]     Arrival date & time 09/04/14  1856 History  This chart was scribed for non-physician practitioner, Glendell Docker, NP working with Leota Jacobsen, MD by Frederich Balding, ED scribe. This patient was seen in room WTR5/WTR5 and the patient's care was started at 7:08 PM.   Chief Complaint  Patient presents with  . Animal Bite   The history is provided by the patient. No language interpreter was used.    HPI Comments: Amber Park is a 81 y.o. female who presents to the Emergency Department complaining of a dog bite to her right calf that occurred prior to arrival. She states it was her grandson's dog and that it is up to date on its vaccinations. Pt is unsure of when her last tetanus was. Denies numbness or weakness. Was ambulatory here without any problem  Past Medical History  Diagnosis Date  . PONV (postoperative nausea and vomiting)   . Shortness of breath dyspnea     with exertion   . Pneumonia     hx of pneumonia x 4   . Anxiety   . GERD (gastroesophageal reflux disease)   . Arthritis   . Cancer     hx of skin cancer removed from nose   . History of blood transfusion 1963  . History of left bundle branch block     old ekg 06-23-11 dr Felipa Eth on chart   Past Surgical History  Procedure Laterality Date  . Back surgery      x 2  . Tonsillectomy    . Breast surgery      bilateral cyst removal   . Abdominal hysterectomy    . Total hip arthroplasty Left 05/31/2014    Procedure: LEFT TOTAL HIP ARTHROPLASTY ANTERIOR APPROACH;  Surgeon: Mauri Pole, MD;  Location: [facility];  Service: Orthopedics;  Laterality: Left;   History reviewed. No pertinent family history. History  Substance Use Topics  . Smoking status: Former Smoker    Quit date: 07/22/1998  . Smokeless tobacco: Never Used  . Alcohol Use: Yes     Comment: rare    OB History    No data available     Review of Systems  Skin: Positive for wound.  All other systems reviewed and are  negative.  Allergies  Meperidine hcl  Home Medications   Prior to Admission medications   Medication Sig Start Date End Date Taking? Authorizing Provider  acetaminophen (TYLENOL) 325 MG tablet Take 2 tablets (650 mg total) by mouth every 6 (six) hours as needed for mild pain (or Fever >/= 101). 06/02/14   Lucille Passy Babish, PA-C  docusate sodium 100 MG CAPS Take 100 mg by mouth 2 (two) times daily. 06/02/14   Lucille Passy Babish, PA-C  famotidine (PEPCID AC) 10 MG chewable tablet Chew 10 mg by mouth 2 (two) times daily as needed for heartburn.    Historical Provider, MD  ferrous sulfate 325 (65 FE) MG tablet Take 1 tablet (325 mg total) by mouth 3 (three) times daily after meals. 06/02/14   Lucille Passy Babish, PA-C  hydrochlorothiazide (HYDRODIURIL) 25 MG tablet Take 25 mg by mouth daily with lunch.    Historical Provider, MD  OVER THE COUNTER MEDICATION Take 3 tablets by mouth at bedtime.    Historical Provider, MD  polyethylene glycol (MIRALAX / GLYCOLAX) packet Take 17 g by mouth 2 (two) times daily. 06/02/14   Lucille Passy Babish, PA-C  tiZANidine (ZANAFLEX) 4 MG tablet Take  1 tablet (4 mg total) by mouth every 8 (eight) hours as needed for muscle spasms. 06/02/14   Lucille Passy Babish, PA-C  traMADol (ULTRAM) 50 MG tablet Take 1-2 tablets (50-100 mg total) by mouth every 6 (six) hours as needed. 06/02/14   Lucille Passy Babish, PA-C   BP 187/68 mmHg  Pulse 75  Temp(Src) 98.1 F (36.7 C) (Oral)  SpO2 100%   Physical Exam  Constitutional: She is oriented to person, place, and time. She appears well-developed and well-nourished. No distress.  HENT:  Head: Normocephalic and atraumatic.  Eyes: Conjunctivae and EOM are normal.  Neck: Neck supple. No tracheal deviation present.  Cardiovascular: Normal rate.   Pulmonary/Chest: Effort normal. No respiratory distress.  Musculoskeletal: Normal range of motion.  Neurological: She is alert and oriented to person, place, and time.   Skin:  Laceration to posterior calf. No bleeding noted at this time. Thru adipose  Psychiatric: She has a normal mood and affect. Her behavior is normal.  Nursing note and vitals reviewed.   ED Course  Procedures (including critical care time)  DIAGNOSTIC STUDIES: Oxygen Saturation is 100% on RA, normal by my interpretation.    COORDINATION OF CARE: 7:11 PM-Advised pt dog bites are not closed due to the risk of infection. Discussed treatment plan which includes xray, wound care, updating tetanus, and Augmentin with pt at bedside and pt agreed to plan.   Labs Review Labs Reviewed - No data to display  Imaging Review Dg Tibia/fibula Right  09/04/2014   CLINICAL DATA:  Status post dog bite today.  Right lower leg pain.  EXAM: RIGHT TIBIA AND FIBULA - 2 VIEW  COMPARISON:  None.  FINDINGS: No acute bony or joint abnormality is identified. No radiopaque foreign body is seen. Dorsal calcaneal spur is noted.  IMPRESSION: No acute abnormality.   Electronically Signed   By: Inge Rise M.D.   On: 09/04/2014 19:34     EKG Interpretation None      MDM   Final diagnoses:  Dog bite    No fb noted in wound. Tetanus updated. Wound cleaned by nursing staff. No stitches needed. Discussed return precautions. Given augment for infection  I personally performed the services described in this documentation, which was scribed in my presence. The recorded information has been reviewed and is accurate.  Glendell Docker, NP 09/04/14 1953  Leota Jacobsen, MD 09/04/14 [phone]

## 2014-09-04 NOTE — Discharge Instructions (Signed)

## 2014-09-04 NOTE — ED Notes (Addendum)
Patient states she was walking at the house when her grandson's dog bit her R calf. Dog is vaccinated. Laceration bleeding controlled.

## 2015-09-14 DIAGNOSIS — D649 Anemia, unspecified: Secondary | ICD-10-CM | POA: Diagnosis not present

## 2016-01-12 DIAGNOSIS — H40013 Open angle with borderline findings, low risk, bilateral: Secondary | ICD-10-CM | POA: Diagnosis not present

## 2016-01-24 DIAGNOSIS — H40013 Open angle with borderline findings, low risk, bilateral: Secondary | ICD-10-CM | POA: Diagnosis not present

## 2016-01-24 DIAGNOSIS — H52203 Unspecified astigmatism, bilateral: Secondary | ICD-10-CM | POA: Diagnosis not present

## 2016-01-24 DIAGNOSIS — H5213 Myopia, bilateral: Secondary | ICD-10-CM | POA: Diagnosis not present

## 2016-02-13 DIAGNOSIS — D508 Other iron deficiency anemias: Secondary | ICD-10-CM | POA: Diagnosis not present

## 2016-02-13 DIAGNOSIS — N183 Chronic kidney disease, stage 3 (moderate): Secondary | ICD-10-CM | POA: Diagnosis not present

## 2016-02-13 DIAGNOSIS — Z79899 Other long term (current) drug therapy: Secondary | ICD-10-CM | POA: Diagnosis not present

## 2016-02-13 DIAGNOSIS — I129 Hypertensive chronic kidney disease with stage 1 through stage 4 chronic kidney disease, or unspecified chronic kidney disease: Secondary | ICD-10-CM | POA: Diagnosis not present

## 2016-04-15 DIAGNOSIS — Z1231 Encounter for screening mammogram for malignant neoplasm of breast: Secondary | ICD-10-CM | POA: Diagnosis not present

## 2016-05-02 DIAGNOSIS — Z23 Encounter for immunization: Secondary | ICD-10-CM | POA: Diagnosis not present

## 2016-07-29 DIAGNOSIS — Z961 Presence of intraocular lens: Secondary | ICD-10-CM | POA: Diagnosis not present

## 2016-07-29 DIAGNOSIS — H40013 Open angle with borderline findings, low risk, bilateral: Secondary | ICD-10-CM | POA: Diagnosis not present

## 2016-08-20 DIAGNOSIS — J984 Other disorders of lung: Secondary | ICD-10-CM | POA: Diagnosis not present

## 2016-08-20 DIAGNOSIS — J310 Chronic rhinitis: Secondary | ICD-10-CM | POA: Diagnosis not present

## 2016-08-20 DIAGNOSIS — E7800 Pure hypercholesterolemia, unspecified: Secondary | ICD-10-CM | POA: Diagnosis not present

## 2016-08-20 DIAGNOSIS — D509 Iron deficiency anemia, unspecified: Secondary | ICD-10-CM | POA: Diagnosis not present

## 2016-08-20 DIAGNOSIS — Z79899 Other long term (current) drug therapy: Secondary | ICD-10-CM | POA: Diagnosis not present

## 2016-08-20 DIAGNOSIS — K219 Gastro-esophageal reflux disease without esophagitis: Secondary | ICD-10-CM | POA: Diagnosis not present

## 2016-08-20 DIAGNOSIS — Z0000 Encounter for general adult medical examination without abnormal findings: Secondary | ICD-10-CM | POA: Diagnosis not present

## 2016-08-20 DIAGNOSIS — I129 Hypertensive chronic kidney disease with stage 1 through stage 4 chronic kidney disease, or unspecified chronic kidney disease: Secondary | ICD-10-CM | POA: Diagnosis not present

## 2016-08-20 DIAGNOSIS — Z1389 Encounter for screening for other disorder: Secondary | ICD-10-CM | POA: Diagnosis not present

## 2016-08-20 DIAGNOSIS — N183 Chronic kidney disease, stage 3 (moderate): Secondary | ICD-10-CM | POA: Diagnosis not present

## 2016-09-23 DIAGNOSIS — D649 Anemia, unspecified: Secondary | ICD-10-CM | POA: Diagnosis not present

## 2016-10-29 DIAGNOSIS — I129 Hypertensive chronic kidney disease with stage 1 through stage 4 chronic kidney disease, or unspecified chronic kidney disease: Secondary | ICD-10-CM | POA: Diagnosis not present

## 2016-11-26 DIAGNOSIS — J069 Acute upper respiratory infection, unspecified: Secondary | ICD-10-CM | POA: Diagnosis not present

## 2016-12-18 ENCOUNTER — Ambulatory Visit (INDEPENDENT_AMBULATORY_CARE_PROVIDER_SITE_OTHER): Payer: Medicare Other | Admitting: Family Medicine

## 2016-12-18 ENCOUNTER — Encounter: Payer: Self-pay | Admitting: Family Medicine

## 2016-12-18 VITALS — BP 144/78 | HR 104 | Temp 98.2°F | Resp 17 | Ht 61.5 in | Wt 146.0 lb

## 2016-12-18 DIAGNOSIS — J029 Acute pharyngitis, unspecified: Secondary | ICD-10-CM | POA: Diagnosis not present

## 2016-12-18 DIAGNOSIS — R0789 Other chest pain: Secondary | ICD-10-CM | POA: Diagnosis not present

## 2016-12-18 LAB — POCT RAPID STREP A (OFFICE): Rapid Strep A Screen: NEGATIVE

## 2016-12-18 NOTE — Patient Instructions (Addendum)
Strep testing was negative or normal here today. I will check a throat culture, but currently your symptoms do not appear to be due to strep throat or a bacteria. You likely have a virus, similar to that they can cause an upper respiratory infection or cold virus. See information below on sore throat. Drink plenty of fluids, Cepacol lozenges or other lozenges as needed. If you have fever, or worsening symptoms, recommend recheck here or with her primary care provider.  I do not see any changes to your EKG from previous reading, but I would like you discuss the chest tightness symptoms you're having with your primary care provider. Those sometimes can be due to heartburn if noticed after eating. If any acute worsening of those chest symptoms, call 911 or go to emergency room.   Sore Throat A sore throat is pain, burning, irritation, or scratchiness in the throat. When you have a sore throat, you may feel pain or tenderness in your throat when you swallow or talk. Many things can cause a sore throat, including:  An infection.  Seasonal allergies.  Dryness in the air.  Irritants, such as smoke or pollution.  Gastroesophageal reflux disease (GERD).  A tumor. A sore throat is often the first sign of another sickness. It may happen with other symptoms, such as coughing, sneezing, fever, and swollen neck glands. Most sore throats go away without medical treatment. Follow these instructions at home:  Take over-the-counter medicines only as told by your health care provider.  Drink enough fluids to keep your urine clear or pale yellow.  Rest as needed.  To help with pain, try:  Sipping warm liquids, such as broth, herbal tea, or warm water.  Eating or drinking cold or frozen liquids, such as frozen ice pops.  Gargling with a salt-water mixture 3-4 times a day or as needed. To make a salt-water mixture, completely dissolve -1 tsp of salt in 1 cup of warm water.  Sucking on hard candy or  throat lozenges.  Putting a cool-mist humidifier in your bedroom at night to moisten the air.  Sitting in the bathroom with the door closed for 5-10 minutes while you run hot water in the shower.  Do not use any tobacco products, such as cigarettes, chewing tobacco, and e-cigarettes. If you need help quitting, ask your health care provider. Contact a health care provider if:  You have a fever for more than 2-3 days.  You have symptoms that last (are persistent) for more than 2-3 days.  Your throat does not get better within 7 days.  You have a fever and your symptoms suddenly get worse. Get help right away if:  You have difficulty breathing.  You cannot swallow fluids, soft foods, or your saliva.  You have increased swelling in your throat or neck.  You have persistent nausea and vomiting. This information is not intended to replace advice given to you by your health care provider. Make sure you discuss any questions you have with your health care provider. Document Released: 08/15/2004 Document Revised: 03/03/2016 Document Reviewed: 04/28/2015 Elsevier Interactive Patient Education  2017 [employer].    IF you received an x-ray today, you will receive an invoice from [facility]. Please contact [facility] at [phone] with questions or concerns regarding your invoice.   IF you received labwork today, you will receive an invoice from [facility]. Please contact [facility] at [phone] with questions or concerns regarding your invoice.   Our billing staff will not be able  to assist you with questions regarding bills from these companies.  You will be contacted with the lab results as soon as they are available. The fastest way to get your results is to activate your My Chart account. Instructions are located on the last page of this paperwork. If you have not heard from [place] regarding the results in 2 weeks, please contact this office.

## 2016-12-18 NOTE — Progress Notes (Addendum)
Subjective:  By signing my name below, I, Moises Blood, attest that this documentation has been prepared under the direction and in the presence of Merri Ray, MD. Electronically Signed: Moises Blood, [place]. 12/18/2016 , 4:09 PM .  Patient was seen in Room 3 .   Patient ID: Amber Park, female    DOB: 1924-08-20, 83 y.o.   MRN: [id number] Chief Complaint  Patient presents with  . Sore Throat   HPI Amber Park is a 83 y.o. female Here for sore throat, light headache and congestion.   Patient states she woke up with sore throat and voice loss 3 weeks ago on May 6th. She was seen by another provider and was given a z-pak because she had chills and a cough medication. She started feeling better after a week. She thought she was back to normal.   Recently, about 4 days ago, she noticed sore throat with some drainage and dry cough. She reports her symptoms worsen at night, but improves in the morning and during the daytime. She describes her symptoms were similar to the symptoms 3 weeks ago, but not as bad. She denies known sick contact at home. She's taken tylenol for the light dull headache.   She also mentions chest tightness in the center of her chest. She notes she had this chest tightness 3 weeks ago, and has returned with the other symptoms. She denies chest pain or palpitations. She denies history of tachycardia or afib. She denies having chest tightness right now in the office. She notices this chest tightness more at night after she eats and before she goes to bed. She takes Pepcid [facility] for GERD. Her last EKG was in 2015. She was previously informed that she had a blockage but not to worry about it. She denies having a cardiologist currently. Her BP usually runs high and takes HCTZ for it. Her PCP is [place], Christiane Ha, MD. She also notes fatigue with decreased energy ongoing for a long time now.   Patient Active Problem List   Diagnosis Date Noted  . Overweight (BMI 25.0-29.9)  06/01/2014  . 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 ANEMIA, IRON DEFICIENCY 05/02/2009  . GERD 05/02/2009  . PERSONAL HX COLONIC POLYPS 05/02/2009   Past Medical History:  Diagnosis Date  . Anxiety   . Arthritis   . Cancer (HCC)    hx of skin cancer removed from nose   . GERD (gastroesophageal reflux disease)   . History of blood transfusion 1963  . History of left bundle branch block    old ekg 06-23-11 dr Felipa Eth on chart  . Pneumonia    hx of pneumonia x 4   . PONV (postoperative nausea and vomiting)   . Shortness of breath dyspnea    with exertion    Past Surgical History:  Procedure Laterality Date  . ABDOMINAL HYSTERECTOMY    . BACK SURGERY     x 2  . BREAST SURGERY     bilateral cyst removal   . TONSILLECTOMY    . TOTAL HIP ARTHROPLASTY Left 05/31/2014   Procedure: LEFT TOTAL HIP ARTHROPLASTY ANTERIOR APPROACH;  Surgeon: Mauri Pole, MD;  Location: [facility];  Service: Orthopedics;  Laterality: Left;   Allergies  Allergen Reactions  . Meperidine Hcl     REACTION: N/V   Prior to Admission medications   Medication Sig Start Date End Date Taking? Authorizing Provider  acetaminophen (TYLENOL) 325 MG tablet Take 2 tablets (650 mg total) by  mouth every 6 (six) hours as needed for mild pain (or Fever >/= 101). 06/02/14  Yes Babish, Rodman Key, PA-C  amoxicillin-clavulanate (AUGMENTIN) 875-125 MG per tablet Take 1 tablet by mouth every 12 (twelve) hours. 09/04/14  Yes Glendell Docker, NP  docusate sodium 100 MG CAPS Take 100 mg by mouth 2 (two) times daily. 06/02/14  Yes Babish, Rodman Key, PA-C  famotidine (PEPCID AC) 10 MG chewable tablet Chew 10 mg by mouth 2 (two) times daily as needed for heartburn.   Yes [provider]  ferrous sulfate 325 (65 FE) MG tablet Take 1 tablet (325 mg total) by mouth 3 (three) times daily after meals. 06/02/14  Yes Babish, Rodman Key, PA-C  hydrochlorothiazide (HYDRODIURIL) 25 MG tablet Take 25 mg by mouth daily with lunch.   Yes [provider]  OVER THE COUNTER MEDICATION Take 3 tablets by mouth at bedtime.   Yes [provider]  polyethylene glycol (MIRALAX / GLYCOLAX) packet Take 17 g by mouth 2 (two) times daily. 06/02/14  Yes Babish, Rodman Key, PA-C  tiZANidine (ZANAFLEX) 4 MG tablet Take 1 tablet (4 mg total) by mouth every 8 (eight) hours as needed for muscle spasms. 06/02/14  Yes Babish, Rodman Key, PA-C  traMADol (ULTRAM) 50 MG tablet Take 1-2 tablets (50-100 mg total) by mouth every 6 (six) hours as needed. 06/02/14  Yes Danae Orleans, PA-C   Social History   Social History  . Marital status: Married    Spouse name: N/A  . Number of children: N/A  . Years of education: N/A   Occupational History  . Not on file.   Social History Main Topics  . Smoking status: Former Smoker    Quit date: 07/22/1998  . Smokeless tobacco: Never Used  . Alcohol use Yes     Comment: rare   . Drug use: No  . Sexual activity: Not on file   Other Topics Concern  . Not on file   Social History Narrative  . No narrative on file   Review of Systems  Constitutional: Positive for fatigue. Negative for chills and fever.  HENT: Positive for congestion and sore throat.   Respiratory: Positive for cough and chest tightness. Negative for shortness of breath and wheezing.   Cardiovascular: Negative for chest pain and palpitations.  Gastrointestinal: Negative for diarrhea, nausea and vomiting.  Neurological: Positive for headaches.       Objective:   Physical Exam  Constitutional: She is oriented to person, place, and time. She appears well-developed and well-nourished. No distress.  HENT:  Head: Normocephalic and atraumatic.  Right Ear: Hearing, tympanic membrane, external ear and ear canal normal.  Left Ear: Hearing, tympanic membrane, external ear and ear canal normal.  Nose: Nose normal. Right sinus exhibits no maxillary sinus tenderness and no frontal sinus tenderness. Left sinus exhibits no maxillary sinus  tenderness and no frontal sinus tenderness.  Mouth/Throat: Oropharynx is clear and moist. No oropharyngeal exudate.  Eyes: Conjunctivae and EOM are normal. Pupils are equal, round, and reactive to light.  Cardiovascular: Normal rate, regular rhythm, normal heart sounds and intact distal pulses.  Exam reveals no gallop and no friction rub.   No murmur heard. Pulmonary/Chest: Effort normal and breath sounds normal. No respiratory distress. She has no wheezes. She has no rhonchi.  Lymphadenopathy:    She has no cervical adenopathy.  Neurological: She is alert and oriented to person, place, and time.  Skin: Skin is warm and dry. No rash noted.  Psychiatric: She has a normal  mood and affect. Her behavior is normal.  Vitals reviewed.   Vitals:   12/18/16 1531 12/18/16 1632  BP: (!) 153/75 (!) 144/78  Pulse: (!) 104   Resp: 17   Temp: 98.2 F (36.8 C)   TempSrc: Oral   SpO2: 98%   Weight: 146 lb (66.2 kg)   Height: 5' 1.5" (1.562 m)    Sinus rhythm, left bundle branch block. Rate 80. Left bundle branch block also seen in 2015 on comparison EKG. Results for orders placed or performed in visit on 12/18/16  POCT rapid strep A  Result Value Ref Range   Rapid Strep A Screen Negative Negative         Assessment & Plan:    Amber Park is a 83 y.o. female Sore throat - Plan: POCT rapid strep A, Culture, Group A Strep, Care order/instruction:  -Likely upper respiratory infection/viral infection. Check throat culture. Symptomatic care discussed as in AVS and RTC precautions.  Chest tightness - Plan: EKG 12-Lead  -EKG without acute findings were apparent changes from previous. Association with eating may be more heartburn related. She apparently has discussed chest tightness with her primary care provider. Advised to follow-up with primary provider to discuss these symptoms further, but ER/911 chest pain precautions were discussed. Understanding expressed.  No orders of the defined  types were placed in this encounter.  Patient Instructions   Strep testing was negative or normal here today. I will check a throat culture, but currently your symptoms do not appear to be due to strep throat or a bacteria. You likely have a virus, similar to that they can cause an upper respiratory infection or cold virus. See information below on sore throat. Drink plenty of fluids, Cepacol lozenges or other lozenges as needed. If you have fever, or worsening symptoms, recommend recheck here or with her primary care provider.  I do not see any changes to your EKG from previous reading, but I would like you discuss the chest tightness symptoms you're having with your primary care provider. Those sometimes can be due to heartburn if noticed after eating. If any acute worsening of those chest symptoms, call 911 or go to emergency room.   Sore Throat A sore throat is pain, burning, irritation, or scratchiness in the throat. When you have a sore throat, you may feel pain or tenderness in your throat when you swallow or talk. Many things can cause a sore throat, including:  An infection.  Seasonal allergies.  Dryness in the air.  Irritants, such as smoke or pollution.  Gastroesophageal reflux disease (GERD).  A tumor. A sore throat is often the first sign of another sickness. It may happen with other symptoms, such as coughing, sneezing, fever, and swollen neck glands. Most sore throats go away without medical treatment. Follow these instructions at home:  Take over-the-counter medicines only as told by your health care provider.  Drink enough fluids to keep your urine clear or pale yellow.  Rest as needed.  To help with pain, try:  Sipping warm liquids, such as broth, herbal tea, or warm water.  Eating or drinking cold or frozen liquids, such as frozen ice pops.  Gargling with a salt-water mixture 3-4 times a day or as needed. To make a salt-water mixture, completely dissolve -1  tsp of salt in 1 cup of warm water.  Sucking on hard candy or throat lozenges.  Putting a cool-mist humidifier in your bedroom at night to moisten the air.  Sitting  in the bathroom with the door closed for 5-10 minutes while you run hot water in the shower.  Do not use any tobacco products, such as cigarettes, chewing tobacco, and e-cigarettes. If you need help quitting, ask your health care provider. Contact a health care provider if:  You have a fever for more than 2-3 days.  You have symptoms that last (are persistent) for more than 2-3 days.  Your throat does not get better within 7 days.  You have a fever and your symptoms suddenly get worse. Get help right away if:  You have difficulty breathing.  You cannot swallow fluids, soft foods, or your saliva.  You have increased swelling in your throat or neck.  You have persistent nausea and vomiting. This information is not intended to replace advice given to you by your health care provider. Make sure you discuss any questions you have with your health care provider. Document Released: 08/15/2004 Document Revised: 03/03/2016 Document Reviewed: 04/28/2015 Elsevier Interactive Patient Education  2017 [employer].    IF you received an x-ray today, you will receive an invoice from [facility]. Please contact [facility] at [phone] with questions or concerns regarding your invoice.   IF you received labwork today, you will receive an invoice from [facility]. Please contact [facility] at [phone] with questions or concerns regarding your invoice.   Our billing staff will not be able to assist you with questions regarding bills from these companies.  You will be contacted with the lab results as soon as they are available. The fastest way to get your results is to activate your My Chart account. Instructions are located on the last page of this paperwork. If you have not heard from [place] regarding the results  in 2 weeks, please contact this office.       I personally performed the services described in this documentation, which was scribed in my presence. The recorded information has been reviewed and considered for accuracy and completeness, addended by me as needed, and agree with information above.  Signed,   Merri Ray, MD [facility] [place].  12/20/16 4:36 PM

## 2016-12-20 LAB — CULTURE, GROUP A STREP: STREP A CULTURE: NEGATIVE

## 2017-01-27 DIAGNOSIS — H40013 Open angle with borderline findings, low risk, bilateral: Secondary | ICD-10-CM | POA: Diagnosis not present

## 2017-01-28 DIAGNOSIS — I129 Hypertensive chronic kidney disease with stage 1 through stage 4 chronic kidney disease, or unspecified chronic kidney disease: Secondary | ICD-10-CM | POA: Diagnosis not present

## 2017-08-25 DIAGNOSIS — I129 Hypertensive chronic kidney disease with stage 1 through stage 4 chronic kidney disease, or unspecified chronic kidney disease: Secondary | ICD-10-CM | POA: Diagnosis not present

## 2017-08-25 DIAGNOSIS — J984 Other disorders of lung: Secondary | ICD-10-CM | POA: Diagnosis not present

## 2017-08-25 DIAGNOSIS — Z0000 Encounter for general adult medical examination without abnormal findings: Secondary | ICD-10-CM | POA: Diagnosis not present

## 2017-08-25 DIAGNOSIS — N183 Chronic kidney disease, stage 3 (moderate): Secondary | ICD-10-CM | POA: Diagnosis not present

## 2017-08-26 ENCOUNTER — Encounter: Payer: Self-pay | Admitting: Neurology

## 2017-09-03 DIAGNOSIS — Z961 Presence of intraocular lens: Secondary | ICD-10-CM | POA: Diagnosis not present

## 2017-09-03 DIAGNOSIS — H40053 Ocular hypertension, bilateral: Secondary | ICD-10-CM | POA: Diagnosis not present

## 2017-09-03 DIAGNOSIS — H40013 Open angle with borderline findings, low risk, bilateral: Secondary | ICD-10-CM | POA: Diagnosis not present

## 2017-09-05 ENCOUNTER — Ambulatory Visit: Payer: Self-pay | Admitting: Surgery

## 2017-09-05 DIAGNOSIS — L723 Sebaceous cyst: Secondary | ICD-10-CM | POA: Diagnosis not present

## 2017-09-05 NOTE — H&P (Signed)
History of Present Illness Amber Park. Qunicy Higinbotham MD; 09/05/2017 11:47 AM) The patient is a 84 year old female who presents with a complaint of Mass. Referred by Dr. Lajean Manes for sebaceous cyst of the right forearm and posterior scalp This is a 84 year old female relatively good health who presents with several years of an enlarging mass on her right forearm. She has had previous sebaceous cysts removed from her scalp. She has developed a new cyst on her posterior scalp as well. The mass on her right forearm has been present for at least 5 years and is enlarging. It causes some mild discomfort. It has never become infected. There is not been any drainage.  The cyst on her posterior scalp has been present for a couple of years. It is becoming rather tight and causing some mild discomfort. No infection or drainage   Past Surgical History Alean Rinne, [place]; 09/05/2017 10:44 AM) Breast Biopsy  Bilateral. Cataract Surgery  Bilateral. Colon Polyp Removal - Colonoscopy  Colon Polyp Removal - Open  Hip Surgery  Left. Hysterectomy (not due to cancer) - Partial  Tonsillectomy   Diagnostic Studies History Alean Rinne, [place]; 09/05/2017 10:44 AM) Colonoscopy  5-10 years ago Mammogram  1-3 years ago  Allergies Alean Rinne, RMA; 09/05/2017 10:45 AM) No Known Drug Allergies [09/05/2017]: Allergies Reconciled   Medication History Alean Rinne, [place]; 09/05/2017 10:47 AM) Vitamin D (2000UNIT Capsule, Oral) Active. Amlodipine Besy-Benazepril HCl (2.5-10MG  Capsule, Oral) Active. Colace (100MG  Capsule, Oral) Active. Glucosamine Chondroitin Complx (Oral) Active. TiZANidine HCl (4MG  Capsule, Oral) Active. Pepcid AC (10MG  Tablet, Oral) Active. HydroCHLOROthiazide (25MG  Tablet, Oral) Active. Medications Reconciled  Social History Alean Rinne, [place]; 09/05/2017 10:44 AM) Alcohol use  Occasional alcohol use. Caffeine use  Carbonated beverages, Coffee. No drug use  Tobacco  use  Former smoker.  Family History Alean Rinne, [place]; 09/05/2017 10:44 AM) Cancer  Mother. Cervical Cancer  Sister. Colon Cancer  Mother. Heart Disease  Father. Melanoma  Sister. Respiratory Condition  Sister.  Pregnancy / Birth History Alean Rinne, [place]; 09/05/2017 10:44 AM) Age at menarche  39 years. Age of menopause  67-60 Gravida  66 Maternal age  102-25 Para  3  Other Problems Alean Rinne, [place]; 09/05/2017 10:44 AM) Arthritis  Back Pain  Cancer  Gastroesophageal Reflux Disease  General anesthesia - complications  High blood pressure  Hypercholesterolemia  Lump In Breast     Review of Systems Alean Rinne RMA; 09/05/2017 10:44 AM) General Present- Fatigue. Not Present- Appetite Loss, Chills, Fever, Night Sweats, Weight Gain and Weight Loss. Skin Present- Dryness and Rash. Not Present- Change in Wart/Mole, Hives, Jaundice, New Lesions, Non-Healing Wounds and Ulcer. HEENT Present- Hearing Loss. Not Present- Earache, Hoarseness, Nose Bleed, Oral Ulcers, Ringing in the Ears, Seasonal Allergies, Sinus Pain, Sore Throat, Visual Disturbances, Wears glasses/contact lenses and Yellow Eyes. Respiratory Not Present- Bloody sputum, Chronic Cough, Difficulty Breathing, Snoring and Wheezing. Breast Not Present- Breast Mass, Breast Pain, Nipple Discharge and Skin Changes. Cardiovascular Present- Leg Cramps, Palpitations and Swelling of Extremities. Not Present- Chest Pain, Difficulty Breathing Lying Down, Rapid Heart Rate and Shortness of Breath. Gastrointestinal Present- Bloating. Not Present- Abdominal Pain, Bloody Stool, Change in Bowel Habits, Chronic diarrhea, Constipation, Difficulty Swallowing, Excessive gas, Gets full quickly at meals, Hemorrhoids, Indigestion, Nausea, Rectal Pain and Vomiting. Female Genitourinary Present- Nocturia. Not Present- Frequency, Painful Urination, Pelvic Pain and Urgency. Musculoskeletal Present- Joint Pain. Not Present- Back  Pain, Joint Stiffness, Muscle Pain, Muscle Weakness and Swelling of Extremities. Neurological Present- Weakness. Not Present-  Decreased Memory, Fainting, Headaches, Numbness, Seizures, Tingling, Tremor and Trouble walking. Psychiatric Not Present- Anxiety, Bipolar, Change in Sleep Pattern, Depression, Fearful and Frequent crying. Endocrine Not Present- Cold Intolerance, Excessive Hunger, Hair Changes, Heat Intolerance, Hot flashes and New Diabetes. Hematology Not Present- Blood Thinners, Easy Bruising, Excessive bleeding, Gland problems, HIV and Persistent Infections.  Vitals Mardene Celeste King RMA; 09/05/2017 10:45 AM) 09/05/2017 10:44 AM Weight: 143.4 lb Height: 62in Body Surface Area: 1.66 m Body Mass Index: 26.23 kg/m  Temp.: 96.62F  Pulse: 98 (Regular)  BP: 145/80 (Sitting, Left Arm, Standard)       Physical Exam Rodman Key K. Azaria Bartell MD; 09/05/2017 11:48 AM) The physical exam findings are as follows: Note:WDWN in NAD Looks younger than stated age Right lateral forearm - 3 cm mobile smooth subcutaneous mass. No inflammation. No drainage Right posterior scalp just above hairline - 2 cm firm, smooth subcutaneous mass. No drainage or inflammation.    Assessment & Plan Amber Park. Hamsini Verrilli MD; 09/05/2017 11:19 AM) SEBACEOUS CYST (L72.3) Impression: Right forearm - 3 cm diameter, subcutaneous Posterior scalp - 2 cm diameter, subcutaneous Current Plans Schedule for Surgery - Excision of sebaceous cysts - right wrist, posterior scalp. The surgical procedure has been discussed with the patient. Potential risks, benefits, alternative treatments, and expected outcomes have been explained. All of the patient's questions at this time have been answered. The likelihood of reaching the patient's treatment goal is good. The patient understand the proposed surgical procedure and wishes to proceed.   Amber Park. Georgette Dover, MD, [facility] Surgery  General/ Trauma  Surgery  09/05/2017 11:49 AM

## 2017-09-22 ENCOUNTER — Ambulatory Visit: Payer: Medicare Other | Admitting: Neurology

## 2017-09-23 ENCOUNTER — Other Ambulatory Visit: Payer: Self-pay | Admitting: Surgery

## 2017-09-23 DIAGNOSIS — D2361 Other benign neoplasm of skin of right upper limb, including shoulder: Secondary | ICD-10-CM | POA: Diagnosis not present

## 2017-09-23 DIAGNOSIS — L723 Sebaceous cyst: Secondary | ICD-10-CM | POA: Diagnosis not present

## 2017-09-23 DIAGNOSIS — D239 Other benign neoplasm of skin, unspecified: Secondary | ICD-10-CM | POA: Diagnosis not present

## 2017-11-24 DIAGNOSIS — I129 Hypertensive chronic kidney disease with stage 1 through stage 4 chronic kidney disease, or unspecified chronic kidney disease: Secondary | ICD-10-CM | POA: Diagnosis not present

## 2017-11-24 DIAGNOSIS — N183 Chronic kidney disease, stage 3 (moderate): Secondary | ICD-10-CM | POA: Diagnosis not present

## 2018-01-01 DIAGNOSIS — Z1231 Encounter for screening mammogram for malignant neoplasm of breast: Secondary | ICD-10-CM | POA: Diagnosis not present

## 2018-01-05 DIAGNOSIS — H40013 Open angle with borderline findings, low risk, bilateral: Secondary | ICD-10-CM | POA: Diagnosis not present

## 2018-01-05 DIAGNOSIS — H40053 Ocular hypertension, bilateral: Secondary | ICD-10-CM | POA: Diagnosis not present

## 2018-02-23 DIAGNOSIS — H40053 Ocular hypertension, bilateral: Secondary | ICD-10-CM | POA: Diagnosis not present

## 2018-04-23 DIAGNOSIS — Z23 Encounter for immunization: Secondary | ICD-10-CM | POA: Diagnosis not present

## 2018-05-25 DIAGNOSIS — H40053 Ocular hypertension, bilateral: Secondary | ICD-10-CM | POA: Diagnosis not present

## 2018-06-05 DIAGNOSIS — I129 Hypertensive chronic kidney disease with stage 1 through stage 4 chronic kidney disease, or unspecified chronic kidney disease: Secondary | ICD-10-CM | POA: Diagnosis not present

## 2018-06-05 DIAGNOSIS — K219 Gastro-esophageal reflux disease without esophagitis: Secondary | ICD-10-CM | POA: Diagnosis not present

## 2018-06-05 DIAGNOSIS — N183 Chronic kidney disease, stage 3 (moderate): Secondary | ICD-10-CM | POA: Diagnosis not present

## 2018-06-05 DIAGNOSIS — K909 Intestinal malabsorption, unspecified: Secondary | ICD-10-CM | POA: Diagnosis not present

## 2018-08-28 DIAGNOSIS — Z1389 Encounter for screening for other disorder: Secondary | ICD-10-CM | POA: Diagnosis not present

## 2018-08-28 DIAGNOSIS — I129 Hypertensive chronic kidney disease with stage 1 through stage 4 chronic kidney disease, or unspecified chronic kidney disease: Secondary | ICD-10-CM | POA: Diagnosis not present

## 2018-08-28 DIAGNOSIS — K219 Gastro-esophageal reflux disease without esophagitis: Secondary | ICD-10-CM | POA: Diagnosis not present

## 2018-08-28 DIAGNOSIS — Z0000 Encounter for general adult medical examination without abnormal findings: Secondary | ICD-10-CM | POA: Diagnosis not present

## 2018-08-28 DIAGNOSIS — J984 Other disorders of lung: Secondary | ICD-10-CM | POA: Diagnosis not present

## 2018-09-14 ENCOUNTER — Other Ambulatory Visit: Payer: Self-pay

## 2018-09-14 ENCOUNTER — Inpatient Hospital Stay (HOSPITAL_COMMUNITY)
Admission: EM | Admit: 2018-09-14 | Discharge: 2018-09-16 | DRG: 379 | Disposition: A | Discharge status: Home / Self Care | Payer: Medicare Other | Attending: Family Medicine | Admitting: Family Medicine

## 2018-09-14 ENCOUNTER — Encounter (HOSPITAL_COMMUNITY): Payer: Self-pay | Admitting: Emergency Medicine

## 2018-09-14 ENCOUNTER — Emergency Department (HOSPITAL_COMMUNITY): Payer: Medicare Other

## 2018-09-14 DIAGNOSIS — K449 Diaphragmatic hernia without obstruction or gangrene: Secondary | ICD-10-CM | POA: Diagnosis not present

## 2018-09-14 DIAGNOSIS — Z96642 Presence of left artificial hip joint: Secondary | ICD-10-CM | POA: Diagnosis present

## 2018-09-14 DIAGNOSIS — R072 Precordial pain: Secondary | ICD-10-CM

## 2018-09-14 DIAGNOSIS — I447 Left bundle-branch block, unspecified: Secondary | ICD-10-CM | POA: Diagnosis present

## 2018-09-14 DIAGNOSIS — K648 Other hemorrhoids: Secondary | ICD-10-CM | POA: Diagnosis present

## 2018-09-14 DIAGNOSIS — Q2733 Arteriovenous malformation of digestive system vessel: Secondary | ICD-10-CM | POA: Diagnosis not present

## 2018-09-14 DIAGNOSIS — I10 Essential (primary) hypertension: Secondary | ICD-10-CM | POA: Diagnosis not present

## 2018-09-14 DIAGNOSIS — R531 Weakness: Secondary | ICD-10-CM

## 2018-09-14 DIAGNOSIS — D500 Iron deficiency anemia secondary to blood loss (chronic): Secondary | ICD-10-CM | POA: Diagnosis present

## 2018-09-14 DIAGNOSIS — Z87891 Personal history of nicotine dependence: Secondary | ICD-10-CM

## 2018-09-14 DIAGNOSIS — R0602 Shortness of breath: Secondary | ICD-10-CM | POA: Diagnosis not present

## 2018-09-14 DIAGNOSIS — K210 Gastro-esophageal reflux disease with esophagitis: Secondary | ICD-10-CM | POA: Diagnosis present

## 2018-09-14 DIAGNOSIS — Z888 Allergy status to other drugs, medicaments and biological substances status: Secondary | ICD-10-CM

## 2018-09-14 DIAGNOSIS — Z860100 Personal history of colon polyps, unspecified: Secondary | ICD-10-CM

## 2018-09-14 DIAGNOSIS — K219 Gastro-esophageal reflux disease without esophagitis: Secondary | ICD-10-CM | POA: Diagnosis present

## 2018-09-14 DIAGNOSIS — K259 Gastric ulcer, unspecified as acute or chronic, without hemorrhage or perforation: Secondary | ICD-10-CM | POA: Diagnosis not present

## 2018-09-14 DIAGNOSIS — M1990 Unspecified osteoarthritis, unspecified site: Secondary | ICD-10-CM | POA: Diagnosis not present

## 2018-09-14 DIAGNOSIS — K31811 Angiodysplasia of stomach and duodenum with bleeding: Principal | ICD-10-CM | POA: Diagnosis present

## 2018-09-14 DIAGNOSIS — Z8719 Personal history of other diseases of the digestive system: Secondary | ICD-10-CM | POA: Diagnosis not present

## 2018-09-14 DIAGNOSIS — Z85828 Personal history of other malignant neoplasm of skin: Secondary | ICD-10-CM

## 2018-09-14 DIAGNOSIS — K31819 Angiodysplasia of stomach and duodenum without bleeding: Secondary | ICD-10-CM | POA: Diagnosis not present

## 2018-09-14 DIAGNOSIS — K5520 Angiodysplasia of colon without hemorrhage: Secondary | ICD-10-CM | POA: Diagnosis not present

## 2018-09-14 DIAGNOSIS — Z79899 Other long term (current) drug therapy: Secondary | ICD-10-CM

## 2018-09-14 DIAGNOSIS — K209 Esophagitis, unspecified: Secondary | ICD-10-CM | POA: Diagnosis not present

## 2018-09-14 DIAGNOSIS — K922 Gastrointestinal hemorrhage, unspecified: Secondary | ICD-10-CM | POA: Diagnosis not present

## 2018-09-14 DIAGNOSIS — D509 Iron deficiency anemia, unspecified: Secondary | ICD-10-CM | POA: Diagnosis present

## 2018-09-14 DIAGNOSIS — F419 Anxiety disorder, unspecified: Secondary | ICD-10-CM | POA: Diagnosis present

## 2018-09-14 DIAGNOSIS — D649 Anemia, unspecified: Secondary | ICD-10-CM

## 2018-09-14 DIAGNOSIS — D508 Other iron deficiency anemias: Secondary | ICD-10-CM | POA: Diagnosis not present

## 2018-09-14 DIAGNOSIS — R195 Other fecal abnormalities: Secondary | ICD-10-CM | POA: Diagnosis not present

## 2018-09-14 LAB — CBC
HCT: 24.1 % — ABNORMAL LOW (ref 36.0–46.0)
HCT: 27.5 % — ABNORMAL LOW (ref 36.0–46.0)
HEMOGLOBIN: 6.6 g/dL — AB (ref 12.0–15.0)
Hemoglobin: 8.1 g/dL — ABNORMAL LOW (ref 12.0–15.0)
MCH: 19 pg — ABNORMAL LOW (ref 26.0–34.0)
MCH: 20.6 pg — ABNORMAL LOW (ref 26.0–34.0)
MCHC: 27.4 g/dL — AB (ref 30.0–36.0)
MCHC: 29.5 g/dL — ABNORMAL LOW (ref 30.0–36.0)
MCV: 69.5 fL — ABNORMAL LOW (ref 80.0–100.0)
MCV: 70 fL — ABNORMAL LOW (ref 80.0–100.0)
Platelets: 307 10*3/uL (ref 150–400)
Platelets: 251 10*3/uL (ref 150–400)
RBC: 3.47 MIL/uL — ABNORMAL LOW (ref 3.87–5.11)
RBC: 3.93 MIL/uL (ref 3.87–5.11)
RDW: 17 % — ABNORMAL HIGH (ref 11.5–15.5)
RDW: 18.4 % — ABNORMAL HIGH (ref 11.5–15.5)
WBC: 8.1 10*3/uL (ref 4.0–10.5)
WBC: 6.7 10*3/uL (ref 4.0–10.5)
nRBC: 0 % (ref 0.0–0.2)
nRBC: 0 % (ref 0.0–0.2)

## 2018-09-14 LAB — IRON AND TIBC
Iron: 10 ug/dL — ABNORMAL LOW (ref 28–170)
Saturation Ratios: 2 % — ABNORMAL LOW (ref 10.4–31.8)
TIBC: 468 ug/dL — ABNORMAL HIGH (ref 250–450)
UIBC: 458 ug/dL

## 2018-09-14 LAB — PREPARE RBC (CROSSMATCH)

## 2018-09-14 LAB — BASIC METABOLIC PANEL
Anion gap: 8 (ref 5–15)
BUN: 17 mg/dL (ref 8–23)
CO2: 22 mmol/L (ref 22–32)
Calcium: 9 mg/dL (ref 8.9–10.3)
Chloride: 105 mmol/L (ref 98–111)
Creatinine, Ser: 0.93 mg/dL (ref 0.44–1.00)
GFR calc Af Amer: >60 mL/min (ref >60)
GFR calc non Af Amer: 54 mL/min — ABNORMAL LOW (ref >60)
Glucose, Bld: 96 mg/dL (ref 70–99)
Potassium: 4 mmol/L (ref 3.5–5.1)
Sodium: 135 mmol/L (ref 135–145)

## 2018-09-14 LAB — URINALYSIS, ROUTINE W REFLEX MICROSCOPIC
Bacteria, UA: NONE SEEN
Bilirubin Urine: NEGATIVE
Glucose, UA: NEGATIVE mg/dL
Hgb urine dipstick: NEGATIVE
Ketones, ur: 5 mg/dL — AB
Nitrite: NEGATIVE
Protein, ur: NEGATIVE mg/dL
Specific Gravity, Urine: 1.011 (ref 1.005–1.030)
pH: 5 (ref 5.0–8.0)

## 2018-09-14 LAB — ABO/RH: ABO/RH(D): O POS

## 2018-09-14 LAB — VITAMIN B12: Vitamin B-12: 385 pg/mL (ref 180–914)

## 2018-09-14 LAB — FOLATE: Folate: 16.6 ng/mL (ref >5.9)

## 2018-09-14 LAB — RETICULOCYTES
Immature Retic Fract: 24 % — ABNORMAL HIGH (ref 2.3–15.9)
RBC.: 3.78 MIL/uL — ABNORMAL LOW (ref 3.87–5.11)
Retic Count, Absolute: 86.6 10*3/uL (ref 19.0–186.0)
Retic Ct Pct: 2.3 % (ref 0.4–3.1)

## 2018-09-14 LAB — I-STAT TROPONIN, ED: Troponin i, poc: 0.01 ng/mL (ref 0.00–0.08)

## 2018-09-14 LAB — FERRITIN: Ferritin: 3 ng/mL — ABNORMAL LOW (ref 11–307)

## 2018-09-14 LAB — POC OCCULT BLOOD, ED: Fecal Occult Bld: POSITIVE — AB

## 2018-09-14 MED ORDER — SODIUM CHLORIDE 0.9 % IV SOLN
80.0000 mg | Freq: Once | INTRAVENOUS | Status: AC
  Administered 2018-09-14: 80 mg via INTRAVENOUS
  Filled 2018-09-14: qty 80

## 2018-09-14 MED ORDER — BISACODYL 10 MG RE SUPP: 10.0000 mg | Freq: Every day | RECTAL | Status: DC | PRN

## 2018-09-14 MED ORDER — POLYETHYLENE GLYCOL 3350 17 G PO PACK: 17.0000 g | PACK | Freq: Two times a day (BID) | ORAL | Status: DC

## 2018-09-14 MED ORDER — FERROUS SULFATE 325 (65 FE) MG PO TABS: 325.0000 mg | ORAL_TABLET | Freq: Three times a day (TID) | ORAL | Status: DC

## 2018-09-14 MED ORDER — SODIUM CHLORIDE 0.9 % IV SOLN
10.0000 mL/h | Freq: Once | INTRAVENOUS | Status: AC
  Administered 2018-09-14: 10 mL/h via INTRAVENOUS

## 2018-09-14 MED ORDER — TRAMADOL HCL 50 MG PO TABS: 50.0000 mg | ORAL_TABLET | Freq: Four times a day (QID) | ORAL | Status: DC | PRN

## 2018-09-14 MED ORDER — ONDANSETRON HCL 4 MG/2ML IJ SOLN: 4.0000 mg | Freq: Four times a day (QID) | INTRAMUSCULAR | Status: DC | PRN

## 2018-09-14 MED ORDER — ACETAMINOPHEN 325 MG PO TABS
650.0000 mg | ORAL_TABLET | Freq: Four times a day (QID) | ORAL | Status: DC | PRN
  Administered 2018-09-15 (×2): 650 mg via ORAL
  Filled 2018-09-14: qty 2

## 2018-09-14 MED ORDER — DOCUSATE SODIUM 100 MG PO CAPS: 100.0000 mg | ORAL_CAPSULE | Freq: Two times a day (BID) | ORAL | Status: DC | PRN

## 2018-09-14 MED ORDER — FAMOTIDINE 20 MG PO TABS: 10.0000 mg | ORAL_TABLET | Freq: Two times a day (BID) | ORAL | Status: DC | PRN

## 2018-09-14 MED ORDER — POLYSACCHARIDE IRON COMPLEX 150 MG PO CAPS
150.0000 mg | ORAL_CAPSULE | Freq: Two times a day (BID) | ORAL | Status: DC
  Administered 2018-09-14 – 2018-09-15 (×4): 150 mg via ORAL
  Filled 2018-09-14 (×4): qty 1

## 2018-09-14 MED ORDER — ONDANSETRON HCL 4 MG PO TABS: 4.0000 mg | ORAL_TABLET | Freq: Four times a day (QID) | ORAL | Status: DC | PRN

## 2018-09-14 MED ORDER — TIZANIDINE HCL 2 MG PO TABS
4.0000 mg | ORAL_TABLET | Freq: Three times a day (TID) | ORAL | Status: DC | PRN
  Administered 2018-09-15 (×2): 4 mg via ORAL
  Filled 2018-09-14 (×2): qty 2

## 2018-09-14 MED ORDER — HYDROCHLOROTHIAZIDE 25 MG PO TABS: 25.0000 mg | ORAL_TABLET | Freq: Every day | ORAL | Status: DC

## 2018-09-14 MED ORDER — DSS 100 MG PO CAPS: 100.0000 mg | ORAL_CAPSULE | Freq: Two times a day (BID) | ORAL | Status: DC

## 2018-09-14 MED ORDER — PANTOPRAZOLE SODIUM 40 MG IV SOLR
40.0000 mg | Freq: Two times a day (BID) | INTRAVENOUS | Status: DC
  Administered 2018-09-14 – 2018-09-15 (×3): 40 mg via INTRAVENOUS
  Filled 2018-09-14 (×3): qty 40

## 2018-09-14 NOTE — ED Provider Notes (Signed)
[facility] EMERGENCY DEPARTMENT Provider Note   CSN: [id number] Arrival date & time: 09/14/18  1105    History   Chief Complaint Chief Complaint  Patient presents with  . Shortness of Breath  . Fatigue    HPI Amber Park is a 85 y.o. female.     Patient presents with general weakness, sob, no energy, intermittent chest tightness, in the past few days. Symptoms gradual onset, moderate, persistent, felt worse today. Has been told blood count low, hgb 8, and had Fe dose increased several weeks ago, but not feeling improved. States stools have been very dark in color, but she had attributed that to the iron therapy. Denies abd pain. No vomiting. Denies cough or uri symptoms. No fever or chills. No pleuritic chest pain. No leg pain or swelling.   The history is provided by the patient.  Shortness of Breath  Associated symptoms: chest pain   Associated symptoms: no abdominal pain, no fever, no headaches, no neck pain, no rash, no sore throat and no vomiting     Past Medical History:  Diagnosis Date  . Anxiety   . Arthritis   . Cancer (HCC)    hx of skin cancer removed from nose   . GERD (gastroesophageal reflux disease)   . History of blood transfusion 1963  . History of left bundle branch block    old ekg 06-23-11 dr Felipa Eth on chart  . Pneumonia    hx of pneumonia x 4   . PONV (postoperative nausea and vomiting)   . Shortness of breath dyspnea    with exertion     Patient Active Problem List   Diagnosis Date Noted  . Overweight (BMI 25.0-29.9) 06/01/2014  . 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 ANEMIA, IRON DEFICIENCY 05/02/2009  . GERD 05/02/2009  . PERSONAL HX COLONIC POLYPS 05/02/2009    Past Surgical History:  Procedure Laterality Date  . ABDOMINAL HYSTERECTOMY    . BACK SURGERY     x 2  . BREAST SURGERY     bilateral cyst removal   . TONSILLECTOMY    . TOTAL HIP ARTHROPLASTY Left 05/31/2014   Procedure: LEFT TOTAL HIP ARTHROPLASTY  ANTERIOR APPROACH;  Surgeon: Mauri Pole, MD;  Location: [facility];  Service: Orthopedics;  Laterality: Left;     OB History   No obstetric history on file.      Home Medications    Prior to Admission medications   Medication Sig Start Date End Date Taking? Authorizing Provider  acetaminophen (TYLENOL) 325 MG tablet Take 2 tablets (650 mg total) by mouth every 6 (six) hours as needed for mild pain (or Fever >/= 101). 06/02/14   Danae Orleans, PA-C  amoxicillin-clavulanate (AUGMENTIN) 875-125 MG per tablet Take 1 tablet by mouth every 12 (twelve) hours. 09/04/14   Glendell Docker, NP  docusate sodium 100 MG CAPS Take 100 mg by mouth 2 (two) times daily. 06/02/14   Danae Orleans, PA-C  famotidine (PEPCID AC) 10 MG chewable tablet Chew 10 mg by mouth 2 (two) times daily as needed for heartburn.    [provider]  ferrous sulfate 325 (65 FE) MG tablet Take 1 tablet (325 mg total) by mouth 3 (three) times daily after meals. 06/02/14   Danae Orleans, PA-C  hydrochlorothiazide (HYDRODIURIL) 25 MG tablet Take 25 mg by mouth daily with lunch.    [provider]  OVER THE COUNTER MEDICATION Take 3 tablets by mouth at bedtime.  [provider]  polyethylene glycol (MIRALAX / GLYCOLAX) packet Take 17 g by mouth 2 (two) times daily. 06/02/14   Danae Orleans, PA-C  tiZANidine (ZANAFLEX) 4 MG tablet Take 1 tablet (4 mg total) by mouth every 8 (eight) hours as needed for muscle spasms. 06/02/14   Danae Orleans, PA-C  traMADol (ULTRAM) 50 MG tablet Take 1-2 tablets (50-100 mg total) by mouth every 6 (six) hours as needed. 06/02/14   Danae Orleans, PA-C    Family History No family history on file.  Social History Social History   Tobacco Use  . Smoking status: Former Smoker    Last attempt to quit: 07/22/1998    Years since quitting: 20.1  . Smokeless tobacco: Never Used  Substance Use Topics  . Alcohol use: Yes    Comment: rare   . Drug use: No      Allergies   Meperidine hcl   Review of Systems Review of Systems  Constitutional: Negative for chills and fever.  HENT: Negative for sore throat.   Eyes: Negative for redness.  Respiratory: Positive for shortness of breath.   Cardiovascular: Positive for chest pain. Negative for palpitations and leg swelling.  Gastrointestinal: Negative for abdominal pain, blood in stool and vomiting.  Genitourinary: Negative for flank pain.  Musculoskeletal: Negative for back pain and neck pain.  Skin: Negative for rash.  Neurological: Positive for weakness and light-headedness. Negative for headaches.  Hematological: Does not bruise/bleed easily.  Psychiatric/Behavioral: Negative for confusion.     Physical Exam Updated Vital Signs BP (!) 160/61   Pulse 88   Temp 97.6 F (36.4 C) (Oral)   Resp 18   Ht 1.549 m (5\' 1" )   Wt 65.8 kg   SpO2 100%   BMI 27.40 kg/m   Physical Exam Vitals signs and nursing note reviewed.  Constitutional:      Appearance: Normal appearance. She is well-developed.  HENT:     Head: Atraumatic.     Nose: Nose normal.     Mouth/Throat:     Mouth: Mucous membranes are moist.  Eyes:     General: No scleral icterus.    Pupils: Pupils are equal, round, and reactive to light.     Comments: Conj pale.   Neck:     Musculoskeletal: Normal range of motion and neck supple. No neck rigidity or muscular tenderness.     Trachea: No tracheal deviation.  Cardiovascular:     Rate and Rhythm: Normal rate and regular rhythm.     Pulses: Normal pulses.     Heart sounds: Normal heart sounds. No murmur. No friction rub. No gallop.   Pulmonary:     Effort: Pulmonary effort is normal. No respiratory distress.     Breath sounds: Normal breath sounds.  Abdominal:     General: Bowel sounds are normal. There is no distension.     Palpations: Abdomen is soft.     Tenderness: There is no abdominal tenderness. There is no guarding.  Genitourinary:    Comments: No cva  tenderness. Black stools. Heme positive. No mass felt.  Musculoskeletal:        General: No swelling.  Skin:    General: Skin is warm and dry.     Coloration: Skin is pale.     Findings: No rash.  Neurological:     Mental Status: She is alert.     Comments: Alert, speech normal.   Psychiatric:        Mood and Affect: Mood  normal.      ED Treatments / Results  Labs (all labs ordered are listed, but only abnormal results are displayed) Results for orders placed or performed during the hospital encounter of 67/34/19  Basic metabolic panel  Result Value Ref Range   Sodium 135 135 - 145 mmol/L   Potassium 4.0 3.5 - 5.1 mmol/L   Chloride 105 98 - 111 mmol/L   CO2 22 22 - 32 mmol/L   Glucose, Bld 96 70 - 99 mg/dL   BUN 17 8 - 23 mg/dL   Creatinine, Ser 0.93 0.44 - 1.00 mg/dL   Calcium 9.0 8.9 - 10.3 mg/dL   GFR calc non Af Amer 54 (L) >60 mL/min   GFR calc Af Amer >60 >60 mL/min   Anion gap 8 5 - 15  CBC  Result Value Ref Range   WBC 8.1 4.0 - 10.5 K/uL   RBC 3.47 (L) 3.87 - 5.11 MIL/uL   Hemoglobin 6.6 (LL) 12.0 - 15.0 g/dL   HCT 24.1 (L) 36.0 - 46.0 %   MCV 69.5 (L) 80.0 - 100.0 fL   MCH 19.0 (L) 26.0 - 34.0 pg   MCHC 27.4 (L) 30.0 - 36.0 g/dL   RDW 17.0 (H) 11.5 - 15.5 %   Platelets 307 150 - 400 K/uL   nRBC 0.0 0.0 - 0.2 %  POC occult blood, ED Provider will collect  Result Value Ref Range   Fecal Occult Bld POSITIVE (A) NEGATIVE   Dg Chest 2 View  Result Date: 09/14/2018 CLINICAL DATA:  Shortness of breath. EXAM: CHEST - 2 VIEW COMPARISON:  Radiographs of May 25, 2014. FINDINGS: The heart size and mediastinal contours are within normal limits. Both lungs are clear. No pneumothorax or pleural effusion is noted. Atherosclerosis of thoracic aorta is noted. Stable hiatal hernia. The visualized skeletal structures are unremarkable. IMPRESSION: No active cardiopulmonary disease.  Stable hiatal hernia. Aortic Atherosclerosis (ICD10-I70.0). Electronically Signed   By:  Marijo Conception, M.D.   On: 09/14/2018 11:53    EKG EKG Interpretation  Date/Time:  Monday September 14 2018 11:11:30 EST Ventricular Rate:  94 PR Interval:  150 QRS Duration: 130 QT Interval:  378 QTC Calculation: 472 R Axis:   -8 Text Interpretation:  Sinus rhythm with Premature atrial complexes Left bundle branch block No significant change since last tracing Confirmed by Lajean Saver [phone]) on 09/14/2018 12:49:47 PM   Radiology Dg Chest 2 View  Result Date: 09/14/2018 CLINICAL DATA:  Shortness of breath. EXAM: CHEST - 2 VIEW COMPARISON:  Radiographs of May 25, 2014. FINDINGS: The heart size and mediastinal contours are within normal limits. Both lungs are clear. No pneumothorax or pleural effusion is noted. Atherosclerosis of thoracic aorta is noted. Stable hiatal hernia. The visualized skeletal structures are unremarkable. IMPRESSION: No active cardiopulmonary disease.  Stable hiatal hernia. Aortic Atherosclerosis (ICD10-I70.0). Electronically Signed   By: Marijo Conception, M.D.   On: 09/14/2018 11:53    Procedures Procedures (including critical care time)  Medications Ordered in ED Medications  0.9 %  sodium chloride infusion (has no administration in time range)     Initial Impression / Assessment and Plan / ED Course  I have reviewed the triage vital signs and the nursing notes.  Pertinent labs & imaging results that were available during my care of the patient were reviewed by me and considered in my medical decision making (see chart for details).  Iv ns. Continuous pulse ox and monitor. o2 2 liters ns.  Stat labs. Ecg. Cxr.   Reviewed nursing notes and prior charts for additional history.   Labs reviewed - hemoglobin 6.6, decreased from prior. Stools heme positive. Black.   Emergently ransfuse 2 units prbcs.   Pt denies epigastric pain. abd soft nt. 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 Pt unclear if history diverticula.   protonix iv.    Medical service consulted for admission.  CRITICAL CARE  RE: severe anemia, hgb 6, requiring emergent transfusion, weakness, dyspnea, chest discomfort.  Performed by: Mirna Mires Total critical care time: 40 minutes Critical care time was exclusive of separately billable procedures and treating other patients. Critical care was necessary to treat or prevent imminent or life-threatening deterioration. Critical care was time spent personally by me on the following activities: development of treatment plan with patient and/or surrogate as well as nursing, discussions with consultants, evaluation of patient's response to treatment, examination of patient, obtaining history from patient or surrogate, ordering and performing treatments and interventions, ordering and review of laboratory studies, ordering and review of radiographic studies, pulse oximetry and re-evaluation of patient's condition.   Final Clinical Impressions(s) / ED Diagnoses   Final diagnoses:  None    ED Discharge Orders    None       Lajean Saver, MD 09/14/18 [phone]

## 2018-09-14 NOTE — ED Notes (Signed)
RN informed ok for 2 visitors to come back

## 2018-09-14 NOTE — ED Notes (Signed)
ED Provider at bedside. 

## 2018-09-14 NOTE — H&P (Signed)
History and Physical    Amber Park:[id number] DOB: October 16, 1924 DOA: 09/14/2018  PCP: Lajean Manes, MD Consultants:  none Patient coming from: Home- lives alone  Chief Complaint: Shortness of breath  HPI: Amber Park is a 85 y.o. female with medical history significant for longstanding iron deficiency anemia, hypertension, GERD who presented to the ED today with c/o shortness of breath, chest tightness and generalized fatigue.  Patient states that the symptoms have been present for months but have been progressively worse, especially over the last week.  She has a longstanding history of iron deficiency anemia.  She was seen by her PCP Dr. Felipa Eth on February 7 for a physical.  At the time her hemoglobin was 8.  She had been taking iron twice weekly but since her appointment was instructed to take 2 iron pills per day.  Since that time she has had an increase in loose stools up to 3-4 bowel movements a day.  These are usually small in volume.  She is had no abdominal pain.  She does have occasional nausea with no emesis.  She states that she does have black stool but only when she is taking iron.  At her appointment on February 7 her amlodipine was also increased from 5 mg to 10 mg and she states that she is felt worse since then as far as her fatigue.  She does have frequent dizziness both with standing and while at rest while sitting.  She states that there is a component of spinning at times but usually just a feeling of lightheadedness.  She has a walker that she uses as needed.  She is not had any falls.  She denies any bright red blood per rectum.  She has had no weight loss.  No early satiety.  She does feel like her abdomen is slightly more distended than usual.  No fevers or chills.  Her last EGD was in 2010 which showed two 2 to 3 mm AVMs in the descending duodenum which were nonbleeding.  She had a colonoscopy at the same time which showed 2 nonbleeding 3 to 4 mm AVMs in the cecum  as well as a small internal hemorrhoid.  She has not had any endoscopic evaluation since that time and has not been seen in GI clinic.  ED Course: Hemoglobin was 6.6.  2 units of packed red blood cells were ordered.  Review of Systems: As per HPI; otherwise review of systems reviewed and negative except for a chronic mild dry cough at night.  Ambulatory Status:  Ambulates with walker as needed  Past Medical History:  Diagnosis Date  . Anxiety   . Arthritis   . Cancer (HCC)    hx of skin cancer removed from nose   . GERD (gastroesophageal reflux disease)   . History of blood transfusion 1963  . History of left bundle branch block    old ekg 06-23-11 dr Felipa Eth on chart  . Pneumonia    hx of pneumonia x 4   . PONV (postoperative nausea and vomiting)   . Shortness of breath dyspnea    with exertion     Past Surgical History:  Procedure Laterality Date  . ABDOMINAL HYSTERECTOMY    . BACK SURGERY     x 2  . BREAST SURGERY     bilateral cyst removal   . TONSILLECTOMY    . TOTAL HIP ARTHROPLASTY Left 05/31/2014   Procedure: LEFT TOTAL HIP ARTHROPLASTY ANTERIOR APPROACH;  Surgeon: Rodman Key  Marian Sorrow, MD;  Location: [facility];  Service: Orthopedics;  Laterality: Left;    Social History   Socioeconomic History  . Marital status: Widowed    Spouse name: Not on file  . Number of children: 3  . Years of education: Not on file  . Highest education level: Not on file  Occupational History  . Occupation: Retired Designer, multimedia at [employer]  . Financial resource strain: Not on file  . Food insecurity:    Worry: Not on file    Inability: Not on file  . Transportation needs:    Medical: Not on file    Non-medical: Not on file  Tobacco Use  . Smoking status: Former Smoker    Packs/day: 0.30    Types: Cigarettes    Start date: 1955    Last attempt to quit: 2005    Years since quitting: 15.1  . Smokeless tobacco: Never Used  Substance and Sexual Activity  .  Alcohol use: Not Currently    Comment: rare   . Drug use: No  . Sexual activity: Not on file  Lifestyle  . Physical activity:    Days per week: Not on file    Minutes per session: Not on file  . Stress: Not on file  Relationships  . Social connections:    Talks on phone: Not on file    Gets together: Not on file    Attends religious service: Not on file    Active member of club or organization: Not on file    Attends meetings of clubs or organizations: Not on file    Relationship status: Not on file  . Intimate partner violence:    Fear of current or ex partner: Not on file    Emotionally abused: Not on file    Physically abused: Not on file    Forced sexual activity: Not on file  Other Topics Concern  . Not on file  Social History Narrative  . Not on file    Allergies  Allergen Reactions  . Meperidine Hcl     REACTION: N/V    Family History  Problem Relation Age of Onset  . Colon cancer Mother 62       Died age 21  . Heart attack Father 72       Died in his sleep of an MI  . Lung cancer Sister        LLL resection, otherwise no treatment; now recurred  . Bladder Cancer Sister        Likely primary  . Renal cancer Sister   . Breast cancer Sister   . Heart disease Brother   . Heart disease Brother     Prior to Admission medications   Medication Sig Start Date End Date Taking? Authorizing Provider  acetaminophen (TYLENOL) 325 MG tablet Take 2 tablets (650 mg total) by mouth every 6 (six) hours as needed for mild pain (or Fever >/= 101). 06/02/14  Yes Babish, Rodman Key, PA-C  amLODipine (NORVASC) 10 MG tablet Take 10 mg by mouth daily. 08/28/18  Yes [provider]  famotidine (PEPCID) 10 MG tablet Take 10 mg by mouth daily.    Yes [provider]  FERREX 150 150 MG capsule Take 150 mg by mouth 2 (two) times daily. 06/08/18  Yes [provider]  latanoprost (XALATAN) 0.005 % ophthalmic solution Place 1 drop into both eyes at bedtime.   09/11/18  Yes [provider]  tiZANidine (  ZANAFLEX) 4 MG tablet Take 1 tablet (4 mg total) by mouth every 8 (eight) hours as needed for muscle spasms. 06/02/14  Yes Danae Orleans, PA-C    Physical Exam: Vitals:   09/14/18 1113 09/14/18 1118  BP:  (!) 160/61  Pulse:  88  Resp:  18  Temp:  97.6 F (36.4 C)  TempSrc:  Oral  SpO2:  100%  Weight: 65.8 kg   Height: 5\' 1"  (1.549 m)      . General: Appears calm and comfortable and is in NAD . Eyes:  PERRL, EOMI, normal lids, iris . ENT:  grossly normal hearing, lips & tongue, pale but moist mucous membranes . Neck:  supple, no lymphadenopathy . Cardiovascular:  nL S1, S2, normal rate, reg rhythm, no murmur. Marland Kitchen Respiratory:   CTA bilaterally with no wheezes/rales/rhonchi.  Normal respiratory effort. . Abdomen:  soft, NT, mildly distended, NABS; no rebound or guarding . Back:   grossly normal alignment . Skin:  no rash or lesions seen on limited exam . Musculoskeletal:  grossly normal tone BUE/BLE, good ROM, no bony abnormality or obvious joint deformity . Lower extremities: 1+ nonpitting BLE edema.  Limited foot exam with no ulcerations.  2+ distal pulses. Marland Kitchen Psychiatric:  grossly normal mood and affect, speech fluent and appropriate, AOx3 . Neurologic:  CN 2-12 grossly intact, moves all extremities in coordinated fashion, sensation intact, Patellar DTRs 2+ and symmetric    Radiological Exams on Admission: Dg Chest 2 View  Result Date: 09/14/2018 CLINICAL DATA:  Shortness of breath. EXAM: CHEST - 2 VIEW COMPARISON:  Radiographs of May 25, 2014. FINDINGS: The heart size and mediastinal contours are within normal limits. Both lungs are clear. No pneumothorax or pleural effusion is noted. Atherosclerosis of thoracic aorta is noted. Stable hiatal hernia. The visualized skeletal structures are unremarkable. IMPRESSION: No active cardiopulmonary disease.  Stable hiatal hernia. Aortic Atherosclerosis (ICD10-I70.0). Electronically  Signed   By: Marijo Conception, M.D.   On: 09/14/2018 11:53    EKG: Independently reviewed.   Date/Time:                  Monday September 14 2018 11:11:30 EST Ventricular Rate:         94 PR Interval:                 150 QRS Duration: 130 QT Interval:                 378 QTC Calculation:        472 R Axis:                         -8 Text Interpretation:       Sinus rhythm with Premature atrial complexes Left bundle branch block No significant change since last tracing Confirmed by Lajean Saver [phone]) on 09/14/2018 12:49:47 PM  Labs on Admission: I have personally reviewed the available labs and imaging studies at the time of the admission.  Pertinent labs:  Sodium 135 potassium 4 chloride 105 CO2 22 glucose 96 BUN 17 creatinine 0.93 calcium 9 Iron 10, TIBC 468, ferritin 3, folate 16.6, vitamin B12 385 WBC 8.1 hemoglobin 6.6 MCV 69.5 platelets 307 Reticulocytes 2.3%, 86.6 absolute, 24% immature reticulocyte fraction (high) Blood type O+, antibody negative   Assessment/Plan Principal Problem:   Symptomatic anemia Active Problems:   ANEMIA, IRON DEFICIENCY   GERD   PERSONAL HX COLONIC POLYPS   Symptomatic iron-deficiency anemia -with black,  heme positive stools -Given history of AVMs in her duodenum and cecum, this is high on the differential.  This could also be due to peptic ulcer disease, gastritis although patient has been on a PPI for years and has really no symptoms of this.  Doubt malignancy as patient has had a stable weight and no other symptoms of cancer.  I suspect this is the underlying cause of her shortness of breath, chest tightness and fatigue and generalized weakness as well as her dizziness.  She is hemodynamically stable at this time. -Admit to medical telemetry -2 units of packed red blood cells have been ordered -She has no known history of CHF but watch for respiratory compromise with transfusion -Follow-up posttransfusion CBC tonight at 8 PM -IV Protonix twice  daily -Maintain 2 large-bore peripheral IVs -Consult GI in the morning. She is not a good candidate for endoscopic evaluation given her age but may benefit from bleeding scan.  Hypertension -Continue amlodipine.  Low threshold to discontinue this if her blood pressure were to drop or if she became tachycardic or otherwise hemodynamically unstable  GERD -continue PPI   DVT prophylaxis: SCDs Code Status:  Full - confirmed with patient/family Family Communication: daughter Juliann Pulse and daughter-in-law Kendrick Fries at bedside  Disposition Plan:  Home once clinically improved Consults called: called [facility] and got no callback Admission status: Admit - It is my clinical opinion that admission to INPATIENT is reasonable and necessary because of the expectation that this patient will require hospital care that crosses at least 2 midnights to treat this condition based on the medical complexity of the problems presented.  Given the aforementioned information, the predictability of an adverse outcome is felt to be significant.     Janora Norlander MD [facility]  If note is complete, please contact covering daytime or nighttime physician. [email or web address] Password [facility]  09/14/2018, 2:44 PM

## 2018-09-14 NOTE — ED Notes (Signed)
Admitting provider at bedside.

## 2018-09-14 NOTE — ED Notes (Signed)
ED TO INPATIENT HANDOFF REPORT  ED Nurse Name and Phone #: Myriam Jacobson RN [phone]  S Name/Age/Gender Amber Park 85 y.o. female Room/Bed: 042C/042C  Code Status   Code Status: Full Code  Home/SNF/Other Home Patient oriented to: self, place, time and situation Is this baseline? Yes   Triage Complete: Triage complete  Chief Complaint sob,gen weakness/sent by dr  Triage Note States changed blood pressure medication 08/28/18 and since then having general weakness. States her hemoglobin is also low and now taking 2 iron tablets a day instead of once week. Overtime developed shortness of breath and supposed to have blood work recheck today and office sent patient to the ED for evaluation.     Allergies Allergies  Allergen Reactions  . Meperidine Hcl     REACTION: N/V    Level of Care/Admitting Diagnosis ED Disposition    ED Disposition Condition Comment   Admit  Hospital Area: [facility] [[id number]]  Level of Care: Medical Telemetry [104]  Diagnosis: Symptomatic anemia [[id number]]  Admitting Physician: Janora Norlander [[id number]]  Attending Physician: Janora Norlander [[id number]]  Estimated length of stay: 3 - 4 days  Certification:: I certify this patient will need inpatient services for at least 2 midnights  PT Class (Do Not Modify): Inpatient [101]  PT Acc Code (Do Not Modify): Private [1]       B Medical/Surgery History Past Medical History:  Diagnosis Date  . Anxiety   . Arthritis   . Cancer (HCC)    hx of skin cancer removed from nose   . GERD (gastroesophageal reflux disease)   . History of blood transfusion 1963  . History of left bundle branch block    old ekg 06-23-11 dr Felipa Eth on chart  . Pneumonia    hx of pneumonia x 4   . PONV (postoperative nausea and vomiting)   . Shortness of breath dyspnea    with exertion    Past Surgical History:  Procedure Laterality Date  . ABDOMINAL HYSTERECTOMY    . BACK SURGERY     x 2  . BREAST  SURGERY     bilateral cyst removal   . TONSILLECTOMY    . TOTAL HIP ARTHROPLASTY Left 05/31/2014   Procedure: LEFT TOTAL HIP ARTHROPLASTY ANTERIOR APPROACH;  Surgeon: Mauri Pole, MD;  Location: [facility];  Service: Orthopedics;  Laterality: Left;     A IV Location/Drains/Wounds Patient Lines/Drains/Airways Status   Active Line/Drains/Airways    Name:   Placement date:   Placement time:   Site:   Days:   Peripheral IV 09/14/18 Left Forearm   09/14/18    1312    Forearm   less than 1          Intake/Output Last 24 hours No intake or output data in the 24 hours ending 09/14/18 1510  Labs/Imaging Results for orders placed or performed during the hospital encounter of 09/14/18 (from the past 48 hour(s))  Basic metabolic panel     Status: Abnormal   Collection Time: 09/14/18 11:19 AM  Result Value Ref Range   Sodium 135 135 - 145 mmol/L   Potassium 4.0 3.5 - 5.1 mmol/L   Chloride 105 98 - 111 mmol/L   CO2 22 22 - 32 mmol/L   Glucose, Bld 96 70 - 99 mg/dL   BUN 17 8 - 23 mg/dL   Creatinine, Ser 0.93 0.44 - 1.00 mg/dL   Calcium 9.0 8.9 - 10.3 mg/dL   GFR calc non  Af Amer 54 (L) >60 mL/min   GFR calc Af Amer >60 >60 mL/min   Anion gap 8 5 - 15    Comment: Performed at [facility] [address] [place], [place] [address]  CBC     Status: Abnormal   Collection Time: 09/14/18 11:19 AM  Result Value Ref Range   WBC 8.1 4.0 - 10.5 K/uL   RBC 3.47 (L) 3.87 - 5.11 MIL/uL   Hemoglobin 6.6 (LL) 12.0 - 15.0 g/dL    Comment: REPEATED TO VERIFY Reticulocyte Hemoglobin testing may be clinically indicated, consider ordering this additional test [id number] THIS CRITICAL RESULT HAS VERIFIED AND BEEN CALLED TO HAYDEN MORRISON,RN BY ZELDA BEECH ON 02 24 2020 AT 6314, AND HAS BEEN READ BACK.     HCT 24.1 (L) 36.0 - 46.0 %   MCV 69.5 (L) 80.0 - 100.0 fL   MCH 19.0 (L) 26.0 - 34.0 pg   MCHC 27.4 (L) 30.0 - 36.0 g/dL   RDW 17.0 (H) 11.5 - 15.5 %   Platelets 307 150 - 400 K/uL    nRBC 0.0 0.0 - 0.2 %    Comment: Performed at [facility] [address] [place], [place] [address]  POC occult blood, ED Provider will collect     Status: Abnormal   Collection Time: 09/14/18 12:57 PM  Result Value Ref Range   Fecal Occult Bld POSITIVE (A) NEGATIVE  Urinalysis, Routine w reflex microscopic     Status: Abnormal   Collection Time: 09/14/18  1:09 PM  Result Value Ref Range   Color, Urine STRAW (A) YELLOW   APPearance CLEAR CLEAR   Specific Gravity, Urine 1.011 1.005 - 1.030   pH 5.0 5.0 - 8.0   Glucose, UA NEGATIVE NEGATIVE mg/dL   Hgb urine dipstick NEGATIVE NEGATIVE   Bilirubin Urine NEGATIVE NEGATIVE   Ketones, ur 5 (A) NEGATIVE mg/dL   Protein, ur NEGATIVE NEGATIVE mg/dL   Nitrite NEGATIVE NEGATIVE   Leukocytes,Ua MODERATE (A) NEGATIVE   RBC / HPF 0-5 0 - 5 RBC/hpf   WBC, UA 0-5 0 - 5 WBC/hpf   Bacteria, UA NONE SEEN NONE SEEN   Squamous Epithelial / LPF 0-5 0 - 5   Mucus PRESENT     Comment: Performed at [facility], [address] [place], [place] [address]  Type and screen     Status: None (Preliminary result)   Collection Time: 09/14/18  1:09 PM  Result Value Ref Range   ABO/RH(D) O POS    Antibody Screen NEG    Sample Expiration 09/17/2018    Unit Number [id number]    Blood Component Type RED CELLS,LR    Unit division 00    Status of Unit ALLOCATED    Transfusion Status OK TO TRANSFUSE    Crossmatch Result      Compatible Performed at [facility], [place] [address], [place], [place] [address]    Unit Number [id number]    Blood Component Type RED CELLS,LR    Unit division 00    Status of Unit ALLOCATED    Transfusion Status OK TO TRANSFUSE    Crossmatch Result Compatible   Vitamin B12     Status: None   Collection Time: 09/14/18  1:09 PM  Result Value Ref Range   Vitamin B-12 385 180 - 914 pg/mL    Comment: (NOTE) This assay is not validated for testing neonatal or myeloproliferative syndrome specimens for Vitamin  B12 levels.  Performed at [facility], [place] [address], [place], [place] [address]   Folate     Status: None   Collection Time: 09/14/18  1:09 PM  Result Value Ref Range   Folate 16.6 >5.9 ng/mL    Comment: Performed at [facility], [place] [address], [place], [place] [address]  Iron and TIBC     Status: Abnormal   Collection Time: 09/14/18  1:09 PM  Result Value Ref Range   Iron 10 (L) 28 - 170 ug/dL   TIBC 468 (H) 250 - 450 ug/dL   Saturation Ratios 2 (L) 10.4 - 31.8 %   UIBC 458 ug/dL    Comment: Performed at [facility], [place] [address], [place], [place] [address]  Ferritin     Status: Abnormal   Collection Time: 09/14/18  1:09 PM  Result Value Ref Range   Ferritin 3 (L) 11 - 307 ng/mL    Comment: Performed at [facility], [place] [address], [place], [place] [address]  Reticulocytes     Status: Abnormal   Collection Time: 09/14/18  1:09 PM  Result Value Ref Range   Retic Ct Pct 2.3 0.4 - 3.1 %   RBC. 3.78 (L) 3.87 - 5.11 MIL/uL   Retic Count, Absolute 86.6 19.0 - 186.0 K/uL   Immature Retic Fract 24.0 (H) 2.3 - 15.9 %    Comment: Performed at [facility] [address] [place], [place] [address]  Prepare RBC     Status: None   Collection Time: 09/14/18  1:09 PM  Result Value Ref Range   Order Confirmation      ORDER PROCESSED BY BLOOD BANK Performed at [facility], [place] [address] [place], [place] [address]   ABO/Rh     Status: None   Collection Time: 09/14/18  1:09 PM  Result Value Ref Range   ABO/RH(D)      O POS Performed at [facility] [address] [place], [place] [address]   I-stat troponin, ED     Status: None   Collection Time: 09/14/18  1:53 PM  Result Value Ref Range   Troponin i, poc 0.01 0.00 - 0.08 ng/mL   Comment 3            Comment: Due to the release kinetics of cTnI, a negative result within the first hours of the onset of symptoms does not rule out myocardial infarction with certainty. If  myocardial infarction is still suspected, repeat the test at appropriate intervals.    Dg Chest 2 View  Result Date: 09/14/2018 CLINICAL DATA:  Shortness of breath. EXAM: CHEST - 2 VIEW COMPARISON:  Radiographs of May 25, 2014. FINDINGS: The heart size and mediastinal contours are within normal limits. Both lungs are clear. No pneumothorax or pleural effusion is noted. Atherosclerosis of thoracic aorta is noted. Stable hiatal hernia. The visualized skeletal structures are unremarkable. IMPRESSION: No active cardiopulmonary disease.  Stable hiatal hernia. Aortic Atherosclerosis (ICD10-I70.0). Electronically Signed   By: Marijo Conception, M.D.   On: 09/14/2018 11:53    Pending Labs Unresulted Labs (From admission, onward)    Start     Ordered   09/15/18 8115  Basic metabolic panel  Tomorrow morning,   R     09/14/18 1403   09/15/18 0500  CBC  Tomorrow morning,   R     09/14/18 1403   09/14/18 2000  CBC  Once-Timed,  R     09/14/18 1403          Vitals/Pain Today's Vitals   09/14/18 1113 09/14/18 1118 09/14/18 1456  BP:  (!) 160/61 (!) 173/63  Pulse:  88 96  Resp:  18 16  Temp:  97.6 F (36.4 C)   TempSrc:  Oral   SpO2:  100% 99%  Weight: 65.8 kg    Height: 5\' 1"  (1.549 m)    PainSc: 0-No pain      Isolation Precautions No active isolations  Medications Medications  0.9 %  sodium chloride infusion (has no administration in time range)  pantoprazole (PROTONIX) 80 mg in sodium chloride 0.9 % 100 mL IVPB (80 mg Intravenous New Bag/Given 09/14/18 1451)  famotidine (PEPCID AC) chewable tablet 10 mg (has no administration in time range)  tiZANidine (ZANAFLEX) tablet 4 mg (has no administration in time range)  acetaminophen (TYLENOL) tablet 650 mg (has no administration in time range)  ondansetron (ZOFRAN) tablet 4 mg (has no administration in time range)    Or  ondansetron (ZOFRAN) injection 4 mg (has no administration in time range)  iron polysaccharides (NIFEREX)  capsule 150 mg (has no administration in time range)  pantoprazole (PROTONIX) injection 40 mg (has no administration in time range)    Mobility walks Low fall risk   Focused Assessments Pulmonary Assessment Handoff:  Lung sounds: Bilateral Breath Sounds: Clear O2 Device: Room Air        R Recommendations: See Admitting Provider Note  Report given to:   Additional Notes: Waiting for Protonix to finish. Pt has 2 units of RBCs ready.

## 2018-09-14 NOTE — ED Triage Notes (Signed)
States changed blood pressure medication 08/28/18 and since then having general weakness. States her hemoglobin is also low and now taking 2 iron tablets a day instead of once week. Overtime developed shortness of breath and supposed to have blood work recheck today and office sent patient to the ED for evaluation.

## 2018-09-15 DIAGNOSIS — K31819 Angiodysplasia of stomach and duodenum without bleeding: Secondary | ICD-10-CM

## 2018-09-15 DIAGNOSIS — D508 Other iron deficiency anemias: Secondary | ICD-10-CM

## 2018-09-15 DIAGNOSIS — K219 Gastro-esophageal reflux disease without esophagitis: Secondary | ICD-10-CM

## 2018-09-15 DIAGNOSIS — K5520 Angiodysplasia of colon without hemorrhage: Secondary | ICD-10-CM

## 2018-09-15 DIAGNOSIS — D500 Iron deficiency anemia secondary to blood loss (chronic): Secondary | ICD-10-CM

## 2018-09-15 LAB — CBC
HCT: 33.1 % — ABNORMAL LOW (ref 36.0–46.0)
Hemoglobin: 10.1 g/dL — ABNORMAL LOW (ref 12.0–15.0)
MCH: 21.8 pg — ABNORMAL LOW (ref 26.0–34.0)
MCHC: 30.5 g/dL (ref 30.0–36.0)
MCV: 71.5 fL — ABNORMAL LOW (ref 80.0–100.0)
Platelets: 245 10*3/uL (ref 150–400)
RBC: 4.63 MIL/uL (ref 3.87–5.11)
RDW: 18.9 % — ABNORMAL HIGH (ref 11.5–15.5)
WBC: 7.1 10*3/uL (ref 4.0–10.5)
nRBC: 0 % (ref 0.0–0.2)

## 2018-09-15 LAB — BASIC METABOLIC PANEL
Anion gap: 11 (ref 5–15)
BUN: 12 mg/dL (ref 8–23)
CO2: 20 mmol/L — ABNORMAL LOW (ref 22–32)
Calcium: 9 mg/dL (ref 8.9–10.3)
Chloride: 106 mmol/L (ref 98–111)
Creatinine, Ser: 1 mg/dL (ref 0.44–1.00)
GFR calc Af Amer: 57 mL/min — ABNORMAL LOW (ref >60)
GFR calc non Af Amer: 49 mL/min — ABNORMAL LOW (ref >60)
Glucose, Bld: 98 mg/dL (ref 70–99)
Potassium: 3.7 mmol/L (ref 3.5–5.1)
Sodium: 137 mmol/L (ref 135–145)

## 2018-09-15 LAB — TYPE AND SCREEN
ABO/RH(D): O POS
Antibody Screen: NEGATIVE
UNIT DIVISION: 0
Unit division: 0

## 2018-09-15 LAB — BPAM RBC
Blood Product Expiration Date: 202003022359
Blood Product Expiration Date: 202003102359
ISSUE DATE / TIME: 202002241601
ISSUE DATE / TIME: 202002242030
Unit Type and Rh: 5100
Unit Type and Rh: 5100

## 2018-09-15 MED ORDER — SODIUM CHLORIDE 0.9 % IV SOLN
510.0000 mg | Freq: Once | INTRAVENOUS | Status: AC
  Administered 2018-09-15: 510 mg via INTRAVENOUS
  Filled 2018-09-15: qty 17

## 2018-09-15 NOTE — Consult Note (Addendum)
Referring Provider: [facility] Primary Care Physician:  Lajean Manes, MD Primary Gastroenterologist:   Lucio Edward MD Reason for Consultation:    anemia    ASSESSMENT / PLAN:    1. 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 She doesn't consistently take prescribed oral iron due to abdominal discomfort / constipation.  -She received 2 uPRBCs and getting IV iron now. Needs plan for iron replacement going forward. Outpatient IV iron as needed is option if can't tolerate PO. -We discussed small bowel enteroscopy for evaluation and treatment of AMVs. She and daughter will think about it. Dr. Rush Landmark can talk more about it when he rounds.  -Hopefully colonoscopy will not be necessary.    2. Bowel changes. Increase frequency of formed stools since starting new brand of iron a couple of weeks ago.  -She will check ingredients to make sure doesn't contain a stool softener as some brands do. If not then something else is probably responsible for increased frequency of BMs. Norvasc was increased on same day she started iron but seems unlikely to be related (would expect constipation) -Since blood has laxative effect it may be causing the increased BMs.  -Just had a panel of blood at PCP's office, probably had TSH at that time    HPI:      HPI: Amber Park is a 85 y.o. female known remotely to Dr. Fuller Plan. 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 Patient has been taking iron intermittently for last 5 years or so.Stools always black on iron and normal color off iron. Iron causes diffuse abdominal discomfort and constipation  interfering with compliance. She takes a stool softener with the iron and that helps but still tends to get diffuse abdominal discomfort on iron. With discontinuation of iron symptoms resolve and she waits to resume iron when starts to feel poorly. No NSAID use. Takes Pepcid at home.   A couple of weeks ago patient felt puny. She says hgb was ~ 8 at the time but she doesn't know how that compares to previous labs. She had been iron for a couple of months. Restarted iron but hgb continued to decline and she presented to hospital with hgb of 6.6. She had started a different brand of iron this time and it hasn't caused abdominal pain like other brands.  As always she started stool softener with the iron but this time developed frequent BM. She stopped the stool softener but continued to have 2-3 formed BMs a day on the new brand of iron.  In retrospect she does recall some medication changes around time she restarted the iron. Her Norvasc was increased but cannot think of any other medication changes. No recent antibiotics.   Past Medical History:  Diagnosis Date  . Anxiety   . Arthritis   . Cancer (HCC)    hx of skin cancer removed from nose   . GERD (gastroesophageal reflux disease)   . History of blood transfusion 1963  . History of left bundle branch block    old ekg 06-23-11 dr Felipa Eth on chart  . Pneumonia    hx of pneumonia  x 4   . PONV (postoperative nausea and vomiting)   . Shortness of breath dyspnea    with exertion     Past Surgical History:  Procedure Laterality Date  . ABDOMINAL HYSTERECTOMY    . BACK SURGERY     x 2  . BREAST SURGERY     bilateral cyst removal   . TONSILLECTOMY    . TOTAL HIP ARTHROPLASTY Left 05/31/2014   Procedure: LEFT TOTAL HIP ARTHROPLASTY ANTERIOR APPROACH;  Surgeon: Mauri Pole, MD;  Location: [facility];  Service: Orthopedics;  Laterality: Left;    Prior to Admission medications   Medication Sig Start Date End Date Taking? Authorizing Provider    acetaminophen (TYLENOL) 325 MG tablet Take 2 tablets (650 mg total) by mouth every 6 (six) hours as needed for mild pain (or Fever >/= 101). 06/02/14  Yes Babish, Rodman Key, PA-C  amLODipine (NORVASC) 10 MG tablet Take 10 mg by mouth daily. 08/28/18  Yes [provider]  famotidine (PEPCID) 10 MG tablet Take 10 mg by mouth daily.    Yes [provider]  FERREX 150 150 MG capsule Take 150 mg by mouth 2 (two) times daily. 06/08/18  Yes [provider]  latanoprost (XALATAN) 0.005 % ophthalmic solution Place 1 drop into both eyes at bedtime.  09/11/18  Yes [provider]  tiZANidine (ZANAFLEX) 4 MG tablet Take 1 tablet (4 mg total) by mouth every 8 (eight) hours as needed for muscle spasms. 06/02/14  Yes Danae Orleans, PA-C    Current Facility-Administered Medications  Medication Dose Route Frequency Provider Last Rate Last Dose  . acetaminophen (TYLENOL) tablet 650 mg  650 mg Oral Q6H PRN Janora Norlander, MD   650 mg at 09/15/18 0234  . famotidine (PEPCID) tablet 10 mg  10 mg Oral BID PRN Janora Norlander, MD      . ferumoxytol [facility]) 510 mg in sodium chloride 0.9 % 100 mL IVPB  510 mg Intravenous Once Joselyn Glassman A, RPH      . iron polysaccharides (NIFEREX) capsule 150 mg  150 mg Oral BID Janora Norlander, MD   150 mg at 09/15/18 1109  . ondansetron (ZOFRAN) tablet 4 mg  4 mg Oral Q6H PRN Janora Norlander, MD       Or  . ondansetron [facility]) injection 4 mg  4 mg Intravenous Q6H PRN Janora Norlander, MD      . pantoprazole (PROTONIX) injection 40 mg  40 mg Intravenous Q12H Janora Norlander, MD   40 mg at 09/15/18 1109  . tiZANidine (ZANAFLEX) tablet 4 mg  4 mg Oral Q8H PRN Janora Norlander, MD   4 mg at 09/15/18 5631    Allergies as of 09/14/2018 - Review Complete 09/14/2018  Allergen Reaction Noted  . Meperidine hcl      Family History  Problem Relation Age of Onset  . Colon cancer Mother 31       Died age 59  . Heart attack Father 43        Died in his sleep of an MI  . Lung cancer Sister        LLL resection, otherwise no treatment; now recurred  . Bladder Cancer Sister        Likely primary  . Renal cancer Sister   . Breast cancer Sister   . Heart disease Brother   . Heart disease Brother     Social History   Socioeconomic History  . Marital  status: Widowed    Spouse name: Not on file  . Number of children: 3  . Years of education: Not on file  . Highest education level: Not on file  Occupational History  . Occupation: Retired Designer, multimedia at [employer]  . Financial resource strain: Not on file  . Food insecurity:    Worry: Not on file    Inability: Not on file  . Transportation needs:    Medical: Not on file    Non-medical: Not on file  Tobacco Use  . Smoking status: Former Smoker    Packs/day: 0.30    Types: Cigarettes    Start date: 1955    Last attempt to quit: 2005    Years since quitting: 15.1  . Smokeless tobacco: Never Used  Substance and Sexual Activity  . Alcohol use: Not Currently    Comment: rare   . Drug use: No  . Sexual activity: Not on file  Lifestyle  . Physical activity:    Days per week: Not on file    Minutes per session: Not on file  . Stress: Not on file  Relationships  . Social connections:    Talks on phone: Not on file    Gets together: Not on file    Attends religious service: Not on file    Active member of club or organization: Not on file    Attends meetings of clubs or organizations: Not on file    Relationship status: Not on file  . Intimate partner violence:    Fear of current or ex partner: Not on file    Emotionally abused: Not on file    Physically abused: Not on file    Forced sexual activity: Not on file  Other Topics Concern  . Not on file  Social History Narrative  . Not on file    Review of Systems: All systems reviewed and negative except where noted in HPI.  Physical Exam: Vital signs in last 24 hours: Temp:  [97.5 F  (36.4 C)-98.7 F (37.1 C)] 97.5 F (36.4 C) (02/25 0937) Pulse Rate:  [75-96] 78 (02/25 0937) Resp:  [16-22] 18 (02/25 0937) BP: (139-173)/(50-63) 155/53 (02/25 0937) SpO2:  [96 %-100 %] 98 % (02/25 0937) Last BM Date: 09/15/18 General:   Alert, well-developed,  White female in NAD Psych:  Pleasant, cooperative. Normal mood and affect. Eyes:  Pupils equal, sclera clear, no icterus.   Conjunctiva pink. Ears:  Normal auditory acuity. Nose:  No deformity, discharge,  or lesions. Neck:  Supple; no masses Lungs:  Clear throughout to auscultation.   No wheezes, crackles, or rhonchi.  Heart:  Regular rate and rhythm; no murmurs, no lower extremity edema Abdomen:  Soft, non-distended, nontender, BS active, no palp mass    Rectal:  Deferred  Msk:  Symmetrical without gross deformities. . Neurologic:  Alert and  oriented x4;  grossly normal neurologically. Skin:  Intact without significant lesions or rashes.   Intake/Output from previous day: 02/24 0701 - 02/25 0700 In: 573.4 [P.O.:300; I.V.:173.4; IV Piggyback:100] Out: 0  Intake/Output this shift: Total I/O In: 360 [P.O.:360] Out: 0   Lab Results: Recent Labs    09/14/18 1119 09/14/18 1951 09/15/18 0220  WBC 8.1 6.7 7.1  HGB 6.6* 8.1* 10.1*  HCT 24.1* 27.5* 33.1*  PLT 307 251 245   BMET Recent Labs    09/14/18 1119 09/15/18 0220  NA 135 137  K 4.0 3.7  CL 105 106  CO2 22 20*  GLUCOSE 96 98  BUN 17 12  CREATININE 0.93 1.00  CALCIUM 9.0 9.0      Studies/Results: Dg Chest 2 View  Result Date: 09/14/2018 CLINICAL DATA:  Shortness of breath. EXAM: CHEST - 2 VIEW COMPARISON:  Radiographs of May 25, 2014. FINDINGS: The heart size and mediastinal contours are within normal limits. Both lungs are clear. No pneumothorax or pleural effusion is noted. Atherosclerosis of thoracic aorta is noted. Stable hiatal hernia. The visualized skeletal structures are unremarkable. IMPRESSION: No active cardiopulmonary disease.   Stable hiatal hernia. Aortic Atherosclerosis (ICD10-I70.0). Electronically Signed   By: Marijo Conception, M.D.   On: 09/14/2018 11:53     Tye Savoy, NP-C @  09/15/2018, 11:14 AM

## 2018-09-15 NOTE — Progress Notes (Signed)
PROGRESS NOTE   Amber Park  DUK:[id number]    DOB: [birth year]    DOA: 09/14/2018  PCP: Lajean Manes, MD   I have briefly reviewed patients previous medical records in [facility].  Brief Narrative:  85 year old female, resident of [facility] independent living, PMH of chronic iron deficiency anemia, GERD, HTN, LBBB, presented to [facility] ED on 09/14/2018 due to several months history of progressively worsening dyspnea, chest tightness, generalized fatigue, dizziness and lightheadedness.  Seen by PCP on 2/7 for physical, hemoglobin 8, iron pills changed but since then having loose stools up to 3-4 times daily, dark stools related to oral iron, amlodipine also increased during that appointment from 5 to 10 mg daily.  Admitted for symptomatic severe iron deficiency anemia suspected due to slow GI blood loss. S/p 2 units PRBC transfusion with symptomatic improvement.  [facility] consulted for possible GI work-up.   Assessment & Plan:   Principal Problem:   Symptomatic anemia Active Problems:   ANEMIA, IRON DEFICIENCY   GERD   PERSONAL HX COLONIC POLYPS   1. Symptomatic anemia/severe iron deficiency: Suspected due to slow GI blood loss from AVMs but could have other causes.  She has history of upper and lower GI AVMs by EGD and colonoscopy October 2010.  Hemoglobin 8 at PCPs on 2/7, presented with hemoglobin of 6.6.  Anemia panel: Iron 10, TIBC 468, saturation ratio 2, ferritin 3, folate 16.6, B12 385, absolute reticulocytes 87. S/P 2 units PRBC transfusion on admission.  Hemoglobin improved from 6.6-10.1 (suspect will equilibrate and drop on subsequent follow-up).  Does not appear that oral iron is effective, either due to poor compliance or malabsorption.  IV iron risks and benefits discussed with patient and daughter at bedside, agreeable, ordered per pharmacy.  Consider periodic IV iron as outpatient.  [facility] consulted for evaluation of suspected GI blood loss.?  EGD/small bowel  enteroscopy +/- colonoscopy.  Bleeding scan not likely to be helpful due to slow blood loss.  Continue PPI. 2. Change in bowel habits: Increased frequency of soft stools.  DD: Stool softeners and iron supplements, blood is cathartic versus others.  No suspicion for C. difficile at this time. 3. Upper and lower GI AVMs: Management as discussed above. 4. Essential hypertension: Reasonable inpatient control.  HCTZ discontinued.  Amlodipine on hold.  Monitor off of antihypertensives for now. 5. GERD: Continue PPI.   DVT prophylaxis: SCDs Code Status: Full Family Communication: Discussed in detail with patient's daughter at bedside, updated care and answered questions Disposition: Determined pending clinical improvement and evaluation by GI   Consultants:  [facility].  Procedures:  None.  Antimicrobials:  None.   Subjective: Patient interviewed and examined along with daughter and RN at bedside.  States that she feels much better and symptoms that brought her to the hospital have resolved.  Denies dyspnea, chest tightness or pain, dizziness or lightheadedness.  Appetite has been "great".  No weight loss reported.  Black stools when on iron.  No red blood in stools noted.  ROS: As above, otherwise negative.  Objective:  Vitals:   09/14/18 2330 09/15/18 0542 09/15/18 0937 09/15/18 1646  BP: (!) 161/60 (!) 139/55 (!) 155/53 (!) 168/64  Pulse: 88 75 78 85  Resp: 20 17 18 18   Temp: 98.5 F (36.9 C) 98.3 F (36.8 C) (!) 97.5 F (36.4 C) 98 F (36.7 C)  TempSrc: Oral  Oral Oral  SpO2: 96% 97% 98% 97%  Weight:  Height:        Examination:  General exam: Pleasant elderly female, moderately built and nourished lying comfortably supine in bed.  Oral mucosa moist. Respiratory system: Clear to auscultation. Respiratory effort normal. Cardiovascular system: S1 & S2 heard, RRR. No JVD, murmurs, rubs, gallops or clicks. No pedal edema.  Telemetry personally reviewed: Sinus rhythm  with BBB morphology. Gastrointestinal system: Abdomen is nondistended, soft and nontender. No organomegaly or masses felt. Normal bowel sounds heard. Central nervous system: Alert and oriented. No focal neurological deficits. Extremities: Symmetric 5 x 5 power. Skin: No rashes, lesions or ulcers Psychiatry: Judgement and insight appear normal. Mood & affect appropriate.     Data Reviewed: I have personally reviewed following labs and imaging studies  CBC: Recent Labs  Lab 09/14/18 1119 09/14/18 1951 09/15/18 0220  WBC 8.1 6.7 7.1  HGB 6.6* 8.1* 10.1*  HCT 24.1* 27.5* 33.1*  MCV 69.5* 70.0* 71.5*  PLT 307 251 459   Basic Metabolic Panel: Recent Labs  Lab 09/14/18 1119 09/15/18 0220  NA 135 137  K 4.0 3.7  CL 105 106  CO2 22 20*  GLUCOSE 96 98  BUN 17 12  CREATININE 0.93 1.00  CALCIUM 9.0 9.0      Radiology Studies: Dg Chest 2 View  Result Date: 09/14/2018 CLINICAL DATA:  Shortness of breath. EXAM: CHEST - 2 VIEW COMPARISON:  Radiographs of May 25, 2014. FINDINGS: The heart size and mediastinal contours are within normal limits. Both lungs are clear. No pneumothorax or pleural effusion is noted. Atherosclerosis of thoracic aorta is noted. Stable hiatal hernia. The visualized skeletal structures are unremarkable. IMPRESSION: No active cardiopulmonary disease.  Stable hiatal hernia. Aortic Atherosclerosis (ICD10-I70.0). Electronically Signed   By: Marijo Conception, M.D.   On: 09/14/2018 11:53        Scheduled Meds: . iron polysaccharides  150 mg Oral BID  . pantoprazole (PROTONIX) IV  40 mg Intravenous Q12H   Continuous Infusions:   LOS: 1 day     Vernell Leep, MD, FACP, [facility]. [facility]  To contact the attending provider between 7A-7P or the covering provider during after hours 7P-7A, please log into the web site [email or web address] and access using universal [place] password for that web site. If you do not have the password, please call the  hospital operator.  09/15/2018, 5:25 PM

## 2018-09-15 NOTE — H&P (View-Only) (Signed)
Referring Provider: [facility] Primary Care Physician:  Lajean Manes, MD Primary Gastroenterologist:   Lucio Edward MD Reason for Consultation:    anemia    ASSESSMENT / PLAN:    1. 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 She doesn't consistently take prescribed oral iron due to abdominal discomfort / constipation.  -She received 2 uPRBCs and getting IV iron now. Needs plan for iron replacement going forward. Outpatient IV iron as needed is option if can't tolerate PO. -We discussed small bowel enteroscopy for evaluation and treatment of AMVs. She and daughter will think about it. Dr. Rush Landmark can talk more about it when he rounds.  -Hopefully colonoscopy will not be necessary.    2. Bowel changes. Increase frequency of formed stools since starting new brand of iron a couple of weeks ago.  -She will check ingredients to make sure doesn't contain a stool softener as some brands do. If not then something else is probably responsible for increased frequency of BMs. Norvasc was increased on same day she started iron but seems unlikely to be related (would expect constipation) -Since blood has laxative effect it may be causing the increased BMs.  -Just had a panel of blood at PCP's office, probably had TSH at that time    HPI:      HPI: Amber Park is a 85 y.o. female known remotely to Dr. Fuller Plan. She has a hx of adenomatous colon polyps, Berino of colon cancer in mother and a hx of iron deficiency anemia secondary to intestinal AVMs. Patient has been taking iron intermittently for last 5 years or so.Stools always black on iron and normal color off iron. Iron causes diffuse abdominal discomfort and constipation  interfering with compliance. She takes a stool softener with the iron and that helps but still tends to get diffuse abdominal discomfort on iron. With discontinuation of iron symptoms resolve and she waits to resume iron when starts to feel poorly. No NSAID use. Takes Pepcid at home.   A couple of weeks ago patient felt puny. She says hgb was ~ 8 at the time but she doesn't know how that compares to previous labs. She had been iron for a couple of months. Restarted iron but hgb continued to decline and she presented to hospital with hgb of 6.6. She had started a different brand of iron this time and it hasn't caused abdominal pain like other brands.  As always she started stool softener with the iron but this time developed frequent BM. She stopped the stool softener but continued to have 2-3 formed BMs a day on the new brand of iron.  In retrospect she does recall some medication changes around time she restarted the iron. Her Norvasc was increased but cannot think of any other medication changes. No recent antibiotics.   Past Medical History:  Diagnosis Date  . Anxiety   . Arthritis   . Cancer (HCC)    hx of skin cancer removed from nose   . GERD (gastroesophageal reflux disease)   . History of blood transfusion 1963  . History of left bundle branch block    old ekg 06-23-11 dr Felipa Eth on chart  . Pneumonia    hx of pneumonia  x 4   . PONV (postoperative nausea and vomiting)   . Shortness of breath dyspnea    with exertion     Past Surgical History:  Procedure Laterality Date  . ABDOMINAL HYSTERECTOMY    . BACK SURGERY     x 2  . BREAST SURGERY     bilateral cyst removal   . TONSILLECTOMY    . TOTAL HIP ARTHROPLASTY Left 05/31/2014   Procedure: LEFT TOTAL HIP ARTHROPLASTY ANTERIOR APPROACH;  Surgeon: Mauri Pole, MD;  Location: [facility];  Service: Orthopedics;  Laterality: Left;    Prior to Admission medications   Medication Sig Start Date End Date Taking? Authorizing Provider    acetaminophen (TYLENOL) 325 MG tablet Take 2 tablets (650 mg total) by mouth every 6 (six) hours as needed for mild pain (or Fever >/= 101). 06/02/14  Yes Babish, Rodman Key, PA-C  amLODipine (NORVASC) 10 MG tablet Take 10 mg by mouth daily. 08/28/18  Yes [provider]  famotidine (PEPCID) 10 MG tablet Take 10 mg by mouth daily.    Yes [provider]  FERREX 150 150 MG capsule Take 150 mg by mouth 2 (two) times daily. 06/08/18  Yes [provider]  latanoprost (XALATAN) 0.005 % ophthalmic solution Place 1 drop into both eyes at bedtime.  09/11/18  Yes [provider]  tiZANidine (ZANAFLEX) 4 MG tablet Take 1 tablet (4 mg total) by mouth every 8 (eight) hours as needed for muscle spasms. 06/02/14  Yes Danae Orleans, PA-C    Current Facility-Administered Medications  Medication Dose Route Frequency Provider Last Rate Last Dose  . acetaminophen (TYLENOL) tablet 650 mg  650 mg Oral Q6H PRN Janora Norlander, MD   650 mg at 09/15/18 0234  . famotidine (PEPCID) tablet 10 mg  10 mg Oral BID PRN Janora Norlander, MD      . ferumoxytol [facility]) 510 mg in sodium chloride 0.9 % 100 mL IVPB  510 mg Intravenous Once Joselyn Glassman A, RPH      . iron polysaccharides (NIFEREX) capsule 150 mg  150 mg Oral BID Janora Norlander, MD   150 mg at 09/15/18 1109  . ondansetron (ZOFRAN) tablet 4 mg  4 mg Oral Q6H PRN Janora Norlander, MD       Or  . ondansetron [facility]) injection 4 mg  4 mg Intravenous Q6H PRN Janora Norlander, MD      . pantoprazole (PROTONIX) injection 40 mg  40 mg Intravenous Q12H Janora Norlander, MD   40 mg at 09/15/18 1109  . tiZANidine (ZANAFLEX) tablet 4 mg  4 mg Oral Q8H PRN Janora Norlander, MD   4 mg at 09/15/18 6734    Allergies as of 09/14/2018 - Review Complete 09/14/2018  Allergen Reaction Noted  . Meperidine hcl      Family History  Problem Relation Age of Onset  . Colon cancer Mother 65       Died age 25  . Heart attack Father 61        Died in his sleep of an MI  . Lung cancer Sister        LLL resection, otherwise no treatment; now recurred  . Bladder Cancer Sister        Likely primary  . Renal cancer Sister   . Breast cancer Sister   . Heart disease Brother   . Heart disease Brother     Social History   Socioeconomic History  . Marital  status: Widowed    Spouse name: Not on file  . Number of children: 3  . Years of education: Not on file  . Highest education level: Not on file  Occupational History  . Occupation: Retired Designer, multimedia at [employer]  . Financial resource strain: Not on file  . Food insecurity:    Worry: Not on file    Inability: Not on file  . Transportation needs:    Medical: Not on file    Non-medical: Not on file  Tobacco Use  . Smoking status: Former Smoker    Packs/day: 0.30    Types: Cigarettes    Start date: 1955    Last attempt to quit: 2005    Years since quitting: 15.1  . Smokeless tobacco: Never Used  Substance and Sexual Activity  . Alcohol use: Not Currently    Comment: rare   . Drug use: No  . Sexual activity: Not on file  Lifestyle  . Physical activity:    Days per week: Not on file    Minutes per session: Not on file  . Stress: Not on file  Relationships  . Social connections:    Talks on phone: Not on file    Gets together: Not on file    Attends religious service: Not on file    Active member of club or organization: Not on file    Attends meetings of clubs or organizations: Not on file    Relationship status: Not on file  . Intimate partner violence:    Fear of current or ex partner: Not on file    Emotionally abused: Not on file    Physically abused: Not on file    Forced sexual activity: Not on file  Other Topics Concern  . Not on file  Social History Narrative  . Not on file    Review of Systems: All systems reviewed and negative except where noted in HPI.  Physical Exam: Vital signs in last 24 hours: Temp:  [97.5 F  (36.4 C)-98.7 F (37.1 C)] 97.5 F (36.4 C) (02/25 0937) Pulse Rate:  [75-96] 78 (02/25 0937) Resp:  [16-22] 18 (02/25 0937) BP: (139-173)/(50-63) 155/53 (02/25 0937) SpO2:  [96 %-100 %] 98 % (02/25 0937) Last BM Date: 09/15/18 General:   Alert, well-developed,  White female in NAD Psych:  Pleasant, cooperative. Normal mood and affect. Eyes:  Pupils equal, sclera clear, no icterus.   Conjunctiva pink. Ears:  Normal auditory acuity. Nose:  No deformity, discharge,  or lesions. Neck:  Supple; no masses Lungs:  Clear throughout to auscultation.   No wheezes, crackles, or rhonchi.  Heart:  Regular rate and rhythm; no murmurs, no lower extremity edema Abdomen:  Soft, non-distended, nontender, BS active, no palp mass    Rectal:  Deferred  Msk:  Symmetrical without gross deformities. . Neurologic:  Alert and  oriented x4;  grossly normal neurologically. Skin:  Intact without significant lesions or rashes.   Intake/Output from previous day: 02/24 0701 - 02/25 0700 In: 573.4 [P.O.:300; I.V.:173.4; IV Piggyback:100] Out: 0  Intake/Output this shift: Total I/O In: 360 [P.O.:360] Out: 0   Lab Results: Recent Labs    09/14/18 1119 09/14/18 1951 09/15/18 0220  WBC 8.1 6.7 7.1  HGB 6.6* 8.1* 10.1*  HCT 24.1* 27.5* 33.1*  PLT 307 251 245   BMET Recent Labs    09/14/18 1119 09/15/18 0220  NA 135 137  K 4.0 3.7  CL 105 106  CO2 22 20*  GLUCOSE 96 98  BUN 17 12  CREATININE 0.93 1.00  CALCIUM 9.0 9.0      Studies/Results: Dg Chest 2 View  Result Date: 09/14/2018 CLINICAL DATA:  Shortness of breath. EXAM: CHEST - 2 VIEW COMPARISON:  Radiographs of May 25, 2014. FINDINGS: The heart size and mediastinal contours are within normal limits. Both lungs are clear. No pneumothorax or pleural effusion is noted. Atherosclerosis of thoracic aorta is noted. Stable hiatal hernia. The visualized skeletal structures are unremarkable. IMPRESSION: No active cardiopulmonary disease.   Stable hiatal hernia. Aortic Atherosclerosis (ICD10-I70.0). Electronically Signed   By: Marijo Conception, M.D.   On: 09/14/2018 11:53     Tye Savoy, NP-C @  09/15/2018, 11:14 AM

## 2018-09-15 NOTE — Progress Notes (Signed)
   09/14/18 2058  Vitals  BP (!) 172/54 (On call provider made aware. )  MAP (mmHg) 88  BP Location Right Arm  BP Method Automatic  Patient Position (if appropriate) Sitting  Pulse Rate 88

## 2018-09-15 NOTE — Progress Notes (Signed)
Patient with long standing h/o of iron deficiency anemia admitted for symptomatic anemia with Hgb 6.6. Patient transfused back to hgb 10.1. Patient's ferritin is 3 and Tsat is 2%. Pharmacy has been consulted to give IV iron to patient.   Plan:  Feraheme 510mg  IV x 1 May repeat in 1 week  Johnatan Baskette A. Levada Dy, PharmD, [place] Pager: [phone] Please utilize Amion for appropriate phone number to reach the unit pharmacist ([facility])

## 2018-09-16 ENCOUNTER — Inpatient Hospital Stay (HOSPITAL_COMMUNITY): Payer: Medicare Other | Admitting: Anesthesiology

## 2018-09-16 ENCOUNTER — Encounter (HOSPITAL_COMMUNITY): Payer: Self-pay | Admitting: Certified Registered"

## 2018-09-16 ENCOUNTER — Encounter (HOSPITAL_COMMUNITY)
Admission: EM | Disposition: A | Discharge status: Home / Self Care | Payer: Self-pay | Source: Home / Self Care | Attending: Internal Medicine

## 2018-09-16 ENCOUNTER — Telehealth: Payer: Self-pay

## 2018-09-16 ENCOUNTER — Other Ambulatory Visit: Payer: Self-pay

## 2018-09-16 DIAGNOSIS — D649 Anemia, unspecified: Secondary | ICD-10-CM

## 2018-09-16 DIAGNOSIS — K922 Gastrointestinal hemorrhage, unspecified: Secondary | ICD-10-CM

## 2018-09-16 DIAGNOSIS — D509 Iron deficiency anemia, unspecified: Secondary | ICD-10-CM

## 2018-09-16 DIAGNOSIS — Q2733 Arteriovenous malformation of digestive system vessel: Secondary | ICD-10-CM

## 2018-09-16 DIAGNOSIS — K259 Gastric ulcer, unspecified as acute or chronic, without hemorrhage or perforation: Secondary | ICD-10-CM

## 2018-09-16 DIAGNOSIS — K209 Esophagitis, unspecified: Secondary | ICD-10-CM

## 2018-09-16 HISTORY — PX: HOT HEMOSTASIS: SHX5433

## 2018-09-16 HISTORY — PX: ENTEROSCOPY: SHX5533

## 2018-09-16 LAB — CBC
HCT: 33.3 % — ABNORMAL LOW (ref 36.0–46.0)
HEMOGLOBIN: 9.9 g/dL — AB (ref 12.0–15.0)
MCH: 21.4 pg — ABNORMAL LOW (ref 26.0–34.0)
MCHC: 29.7 g/dL — ABNORMAL LOW (ref 30.0–36.0)
MCV: 72.1 fL — AB (ref 80.0–100.0)
Platelets: 260 10*3/uL (ref 150–400)
RBC: 4.62 MIL/uL (ref 3.87–5.11)
RDW: 19.6 % — ABNORMAL HIGH (ref 11.5–15.5)
WBC: 6.5 10*3/uL (ref 4.0–10.5)
nRBC: 0 % (ref 0.0–0.2)

## 2018-09-16 SURGERY — ENTEROSCOPY
Anesthesia: Monitor Anesthesia Care

## 2018-09-16 MED ORDER — PROPOFOL 500 MG/50ML IV EMUL
INTRAVENOUS | Status: DC | PRN
  Administered 2018-09-16: 100 ug/kg/min via INTRAVENOUS

## 2018-09-16 MED ORDER — SPOT INK MARKER SYRINGE KIT
PACK | SUBMUCOSAL | Status: AC
  Filled 2018-09-16: qty 5

## 2018-09-16 MED ORDER — PROPOFOL 10 MG/ML IV BOLUS
INTRAVENOUS | Status: DC | PRN
  Administered 2018-09-16 (×5): 10 mg via INTRAVENOUS

## 2018-09-16 MED ORDER — PANTOPRAZOLE SODIUM 40 MG PO TBEC: 40.0000 mg | DELAYED_RELEASE_TABLET | Freq: Two times a day (BID) | ORAL | 0 refills | Status: DC

## 2018-09-16 MED ORDER — LACTATED RINGERS IV SOLN
INTRAVENOUS | Status: DC
  Administered 2018-09-16: 11:00:00 via INTRAVENOUS

## 2018-09-16 MED ORDER — SODIUM CHLORIDE 0.9 % IV SOLN: INTRAVENOUS | Status: DC

## 2018-09-16 MED ORDER — SPOT INK MARKER SYRINGE KIT
PACK | SUBMUCOSAL | Status: DC | PRN
  Administered 2018-09-16: 1 mL via SUBMUCOSAL

## 2018-09-16 MED ORDER — SODIUM CHLORIDE (PF) 0.9 % IJ SOLN
INTRAMUSCULAR | Status: DC | PRN
  Administered 2018-09-16: 1 mL

## 2018-09-16 MED ORDER — FERREX 150 150 MG PO CAPS: 150.0000 mg | ORAL_CAPSULE | Freq: Every day | ORAL | 0 refills | Status: DC

## 2018-09-16 NOTE — Telephone Encounter (Signed)
-----   Message from Irving Copas., MD sent at 09/16/2018 12:45 PM EST ----- Amber Park, please set up a CBC in 1 week to be drawn at the lab. I would also go ahead and set up a 1 month CBC/iron/TIBC/ferritin to be drawn 1 month from next week.  Please schedule her a clinic visit with me approximately 3 or 4 days after the labs have been drawn. She also needs to receive her second IV iron infusion and if you can work on setting that up in approximately 10 to 14 days from now that would be great. Please let me know if there are any questions. Thank you. GM

## 2018-09-16 NOTE — Plan of Care (Signed)

## 2018-09-16 NOTE — Telephone Encounter (Signed)
The pt has been scheduled for 2nd iron infusion at the patient care center [facility] # rd floor on 3/11 at 9 am Labs have been added for 1 week and 1 month prior to the next appt.  ROV scheduled for 10/19/1028 am with Dr Rush Landmark   Left message on machine to call back

## 2018-09-16 NOTE — Discharge Summary (Signed)
Physician Discharge Summary  Amber Park BTD:[id number] DOB: 16-Nov-1924 DOA: 09/14/2018  PCP: Amber Manes, MD  Admit date: 09/14/2018 Discharge date: 09/16/2018  Time spent: 40 minutes  Recommendations for Outpatient Follow-up:  1. Follow up outpatient CBC/CMP (CBC in about 1 week) 2. Follow up for iron infusion in about 2 weeks with GI 3. Follow up repeat iron studies in about 1 month 4. Ensure PPI BID for 2 months, then daily 5. Follow up to ensure tolerance of iron, discharged on daily iron 6. Follow up with GI regarding possibility of further w/u with colonoscopy   Discharge Diagnoses:  Principal Problem:   Symptomatic anemia Active Problems:   ANEMIA, IRON DEFICIENCY   GERD   PERSONAL HX COLONIC POLYPS   Discharge Condition: stable  Diet recommendation: heart healthy  Filed Weights   09/14/18 1113  Weight: 65.8 kg    History of present illness:  85 year old female, resident of [facility], PMH of chronic iron deficiency anemia, GERD, HTN, LBBB, presented to [facility] ED on 09/14/2018 due to several months history of progressively worsening dyspnea, chest tightness, generalized fatigue, dizziness and lightheadedness.  Seen by PCP on 2/7 for physical, hemoglobin 8, iron pills changed but since then having loose stools up to 3-4 times daily, dark stools related to oral iron, amlodipine also increased during that appointment from 5 to 10 mg daily.  Admitted for symptomatic severe iron deficiency anemia suspected due to slow GI blood loss. S/p 2 units PRBC transfusion with symptomatic improvement.  [facility] consulted for possible GI work-up.  She was admitted for iron deficiency anemia thought 2/2 slow GI blood loss.  She received 2 units of pRBC and an iron infusion.  She had several angioectasias in the stomach and duodenum, treated by GI.  Also had cameron's lesions and esophagitis.  Discharged on BID PPI, daily iron.  Plan for outpatient follow up with  GI.   Hospital Course:  1. Symptomatic anemia/severe iron deficiency: Now s/p EGD by GI.  Notable for grade B esophagitis, hiatal hernia with cameron's lesions, single nonbleeding angioectasia in stomach, treated with APC.  5 nonbleeding angioectasias  in duodenum, treated with APC.  2 clips placed on 1 angioectasia which initially oozed, but hemostatic after additional APC.   1. Hb stable from yesterday to today 2. Plan for discharge on BID PPI, daily iron with plans for CBC in Amber Park week, another iron infusion in ~2 weeks, and repeat iron studies in 1 month.  Will need outpatient GI follow up for possible colonoscopy.  2. Change in bowel habits: Increased frequency of soft stools.  DD: Stool softeners and iron supplements, blood is cathartic versus others.  No suspicion for C. difficile at this time.  Last BM yesterday morning, improved.  Continue to monitor.   3. Upper and lower GI AVMs: Management as discussed above. 4. Essential hypertension: Reasonable inpatient control.  HCTZ discontinued. Resume amlodipine at discharge. 5. GERD: Continue PPI.   Procedures: Impression - No gross lesions in proximal/middle esophagus. LA Grade B esophagitis in distal esophagus. - Large hiatal hernia. Erosions without bleeding - consistent with Cameron's lesions present.. - Amber Park single non-bleeding angioectasia in the stomach. Treated with argon plasma coagulation (APC). - No other gross lesions in the stomach. - Five non-bleeding angioectasias in the duodenum. Treated with argon plasma coagulation (APC). 2 clips (MR conditional) were placed on 1 angioectasia which had oozed initially during first APC but stopped after further APC for additional hemostasis. - Normal  mucosa was found in the entire examined duodenum otherwise. - Normal mucosa was found in the proximal jejunum. Tattooed distal extent of SBE. (i.e. Studies not automatically included, echos, thoracentesis, etc; not x-rays)  - The patient will  be observed post-procedure, until all discharge criteria are met. - Discharge patient to home. - Return patient to hospital ward for ongoing care. - Continue PO PPI BID 40 mg x 7-month and then decrease to 40 mg daily thereafter. - IV Iron infusion x 1 during this hospitalization, and we should work on getting her another IV Iron infusion in 2-weeks as an outpatient. - PO Iron once daily as per Iron she is being given in hospital as she is tolerating it. - Will discuss with patient/family consideration of Inpatient colonoscopy vs Outpatient colonoscopy. - Repeat CBC in 1 week. - When discharged, should repeat CBC/Iron/TIBC/Ferritin in 182-montho see what has happened with Iron supplementation - The findings and recommendations were discussed with the patient. - The findings and recommendations were discussed with the patient's family. - The findings and recommendations were discussed with the referring physician.  Consultations:  GI  Discharge Exam: Vitals:   09/16/18 1210 09/16/18 1229  BP: (!) 171/62 (!) 163/62  Pulse: 86 90  Resp: 20 18  Temp:  97.6 F (36.4 C)  SpO2: 98% 98%   Feels better Ready for d/c after procedure Family at bedside  General: No acute distress. Cardiovascular: Heart sounds show Amber Park regular rate, and rhythm. Lungs: Clear to auscultation bilaterally  Abdomen: Soft, nontender, nondistended Neurological: Alert and oriented 3. Moves all extremities 4. Skin: Warm and dry. No rashes or lesions. Extremities: No clubbing or cyanosis. No edema.  Discharge Instructions   Discharge Instructions    Call MD for:  difficulty breathing, headache or visual disturbances   Complete by:  As directed    Call MD for:  extreme fatigue   Complete by:  As directed    Call MD for:  persistant dizziness or light-headedness   Complete by:  As directed    Call MD for:  persistant nausea and vomiting   Complete by:  As directed    Call MD for:  redness, tenderness,  or signs of infection (pain, swelling, redness, odor or green/yellow discharge around incision site)   Complete by:  As directed    Call MD for:  severe uncontrolled pain   Complete by:  As directed    Call MD for:  temperature >100.4   Complete by:  As directed    Diet - low sodium heart healthy   Complete by:  As directed    Discharge instructions   Complete by:  As directed    You were seen for anemia (low blood counts).  You were transfused 2 units of blood and given IV iron.  You had Annastyn Silvey procedure with gastroenterology which showed angioectasias which were treated (in the stomach and duodenum).  Continue iron daily after discharge.  Continue twice daily protonix for 2 months, then take once daily.  Gastroenterology should help arrange another IV iron infusion in about 2 weeks.    You'll need Tarea Skillman repeat blood count in about Deonte Otting week and repeat iron studies in about 1 month.  Return for new, recurrent, or worsening symptoms.  Please ask your PCP to request records from this hospitalization so they know what was done and what the next steps will be.   Increase activity slowly   Complete by:  As directed  Allergies as of 09/16/2018      Reactions   Meperidine Hcl    REACTION: N/V      Medication List    STOP taking these medications   famotidine 10 MG tablet Commonly known as:  PEPCID     TAKE these medications   acetaminophen 325 MG tablet Commonly known as:  TYLENOL Take 2 tablets (650 mg total) by mouth every 6 (six) hours as needed for mild pain (or Fever >/= 101).   amLODipine 10 MG tablet Commonly known as:  NORVASC Take 10 mg by mouth daily.   FERREX 150 150 MG capsule Generic drug:  iron polysaccharides Take 1 capsule (150 mg total) by mouth daily for 30 days. What changed:  when to take this Notes to patient:  Take with food   latanoprost 0.005 % ophthalmic solution Commonly known as:  XALATAN Place 1 drop into both eyes at bedtime.   pantoprazole  40 MG tablet Commonly known as:  PROTONIX Take 1 tablet (40 mg total) by mouth 2 (two) times daily. (take twice daily for 2 months, then 40 mg daily after)   tiZANidine 4 MG tablet Commonly known as:  ZANAFLEX Take 1 tablet (4 mg total) by mouth every 8 (eight) hours as needed for muscle spasms.      Allergies  Allergen Reactions  . Meperidine Hcl     REACTION: N/V   Follow-up Information    Amber Manes, MD Follow up.   Specialty:  Internal Medicine Contact information: [address] [place] [address] [phone]        Mansouraty, Telford Nab., MD Follow up.   Specialties:  Gastroenterology, Internal Medicine Contact information: [place] [address] [phone]            The results of significant diagnostics from this hospitalization (including imaging, microbiology, ancillary and laboratory) are listed below for reference.    Significant Diagnostic Studies: Dg Chest 2 View  Result Date: 09/14/2018 CLINICAL DATA:  Shortness of breath. EXAM: CHEST - 2 VIEW COMPARISON:  Radiographs of May 25, 2014. FINDINGS: The heart size and mediastinal contours are within normal limits. Both lungs are clear. No pneumothorax or pleural effusion is noted. Atherosclerosis of thoracic aorta is noted. Stable hiatal hernia. The visualized skeletal structures are unremarkable. IMPRESSION: No active cardiopulmonary disease.  Stable hiatal hernia. Aortic Atherosclerosis (ICD10-I70.0). Electronically Signed   By: Marijo Conception, M.D.   On: 09/14/2018 11:53    Microbiology: No results found for this or any previous visit (from the past 240 hour(s)).   Labs: Basic Metabolic Panel: Recent Labs  Lab 09/14/18 1119 09/15/18 0220  NA 135 137  K 4.0 3.7  CL 105 106  CO2 22 20*  GLUCOSE 96 98  BUN 17 12  CREATININE 0.93 1.00  CALCIUM 9.0 9.0   Liver Function Tests: No results for input(s): AST, ALT, ALKPHOS, BILITOT, PROT, ALBUMIN in the  last 168 hours. No results for input(s): LIPASE, AMYLASE in the last 168 hours. No results for input(s): AMMONIA in the last 168 hours. CBC: Recent Labs  Lab 09/14/18 1119 09/14/18 1951 09/15/18 0220 09/16/18 0419  WBC 8.1 6.7 7.1 6.5  HGB 6.6* 8.1* 10.1* 9.9*  HCT 24.1* 27.5* 33.1* 33.3*  MCV 69.5* 70.0* 71.5* 72.1*  PLT 307 251 245 260   Cardiac Enzymes: No results for input(s): CKTOTAL, CKMB, CKMBINDEX, TROPONINI in the last 168 hours. BNP: BNP (last 3 results) No results for  input(s): BNP in the last 8760 hours.  ProBNP (last 3 results) No results for input(s): PROBNP in the last 8760 hours.  CBG: No results for input(s): GLUCAP in the last 168 hours.     Signed:  Fayrene Helper MD.  [facility] 09/16/2018, 7:00 PM

## 2018-09-16 NOTE — Progress Notes (Signed)
The pt has been scheduled for 2nd iron infusion at the patient care center [place] # rd floor on 3/11 at 9 am Labs have been added for 1 week and 1 month prior to the next appt.  ROV scheduled for 10/19/1028 am with Dr Rush Landmark

## 2018-09-16 NOTE — Telephone Encounter (Signed)
The patient has been notified of this information and all questions answered. The pt has been advised of the information and verbalized understanding.    

## 2018-09-16 NOTE — Op Note (Signed)
[facility] Patient Name: Amber Park Procedure Date : 09/16/2018 MRN: [id number] Attending MD: Justice Britain , MD Date of Birth: August 15, 1924 CSN: [id number] Age: 85 Admit Type: Inpatient Procedure:                Small bowel enteroscopy Indications:              Iron deficiency anemia secondary to chronic blood                            loss, Iron deficiency anemia, Obscure                            gastrointestinal bleeding, Arteriovenous                            malformation in the small intestine in past                            previously on Iron but not great with taking                            regularly Providers:                Justice Britain, MD, Carlyn Reichert, RN, Elspeth Cho Tech., Technician, Claybon Jabs CRNA, CRNA Referring MD:             Dr. Marcelline Deist ([facility]), Pricilla Riffle. Fuller Plan, MD Medicines:                Monitored Anesthesia Care Complications:            No immediate complications. Estimated Blood Loss:     Estimated blood loss was minimal. Procedure:                Pre-Anesthesia Assessment:                           - Prior to the procedure, a History and Physical                            was performed, and patient medications and                            allergies were reviewed. The patient's tolerance of                            previous anesthesia was also reviewed. The risks                            and benefits of the procedure and the sedation                            options and risks were discussed with the patient.  All questions were answered, and informed consent                            was obtained. Prior Anticoagulants: The patient has                            taken no previous anticoagulant or antiplatelet                            agents. ASA Grade Assessment: III - A patient with                            severe systemic disease. After reviewing  the risks                            and benefits, the patient was deemed in                            satisfactory condition to undergo the procedure.                           After obtaining informed consent, the endoscope was                            passed under direct vision. Throughout the                            procedure, the patient's blood pressure, pulse, and                            oxygen saturations were monitored continuously. The                            PCF-H190DL ([id number]) Olympus pediatric colonoscope                            was introduced through the mouth and advanced to                            the proximal jejunum. The small bowel enteroscopy                            was accomplished without difficulty. The patient                            tolerated the procedure. Scope In: Scope Out: Findings:      No gross lesions were noted in the proximal esophagus and in the mid       esophagus.      LA Grade B (one or more mucosal breaks greater than 5 mm, not extending       between the tops of two mucosal folds) esophagitis with no bleeding was       found in the distal esophagus.      A large hiatal hernia was found. The proximal  extent of the gastric       folds (end of tubular esophagus) was 32 cm from the incisors. The hiatal       narrowing was 39 cm from the incisors. The Z-line was 31 cm from the       incisors.      Multiple non-bleeding dispersed erosions were found in the cardia and in       the gastric fundus within the hiatal hernia - consistent with Cameron's       lesions.      A single, small, non-bleeding angioectasia was found on the lesser       curvature of the gastric body. Fulguration to ablate the lesion to       prevent bleeding by argon plasma was successful using gastric settings.      No other gross lesions were noted in the entire examined stomach.      Five angioectasias with no bleeding were found in the D1/D2 angle, D2, &        D3 regions of the duodenum. Fulguration to ablate the lesion to prevent       bleeding by argon plasma was successful using right colon settings. One       angioectasia had oozing after beginning APC but stopped after further       APC. For additional hemostasis, two hemostatic clips were successfully       placed (MR conditional) on that lesion. There was no bleeding at the end       of the procedure.      Normal mucosa was found in the entire duodenum otherwise.      Normal mucosa was found in the proximal jejunum. Area was tattooed with       an injection of Spot (carbon black) to demarcate distal extent of push       enteroscopy. Impression:               - No gross lesions in proximal/middle esophagus. LA                            Grade B esophagitis in distal esophagus.                           - Large hiatal hernia. Erosions without bleeding -                            consistent with Cameron's lesions present..                           - A single non-bleeding angioectasia in the                            stomach. Treated with argon plasma coagulation                            (APC).                           - No other gross lesions in the stomach.                           -  Five non-bleeding angioectasias in the duodenum.                            Treated with argon plasma coagulation (APC). 2                            clips (MR conditional) were placed on 1                            angioectasia which had oozed initially during first                            APC but stopped after further APC for additional                            hemostasis.                           - Normal mucosa was found in the entire examined                            duodenum otherwise.                           - Normal mucosa was found in the proximal jejunum.                            Tattooed distal extent of SBE. Recommendation:           - The patient will be observed  post-procedure,                            until all discharge criteria are met.                           - Discharge patient to home.                           - Return patient to hospital ward for ongoing care.                           - Continue PO PPI BID 40 mg x 38-month and then                            decrease to 40 mg daily thereafter.                           - IV Iron infusion x 1 during this hospitalization,                            and we should work on getting her another IV Iron                            infusion in 2-weeks as an outpatient.                           -  PO Iron once daily as per Iron she is being given                            in hospital as she is tolerating it.                           - Will discuss with patient/family consideration of                            Inpatient colonoscopy vs Outpatient colonoscopy.                           - Repeat CBC in 1 week.                           - When discharged, should repeat                            CBC/Iron/TIBC/Ferritin in 56-monthto see what has                            happened with Iron supplementation                           - The findings and recommendations were discussed                            with the patient.                           - The findings and recommendations were discussed                            with the patient's family.                           - The findings and recommendations were discussed                            with the referring physician. Procedure Code(s):        --- Professional ---                           [phone] Small intestinal endoscopy, enteroscopy                            beyond second portion of duodenum, not including                            ileum; with control of bleeding (eg, injection,                            bipolar cautery, unipolar cautery, laser, heater  probe, stapler, plasma coagulator)                            [phone], Unlisted procedure, small intestine Diagnosis Code(s):        --- Professional ---                           K20.9, Esophagitis, unspecified                           K44.9, Diaphragmatic hernia without obstruction or                            gangrene                           K25.9, Gastric ulcer, unspecified as acute or                            chronic, without hemorrhage or perforation                           K31.819, Angiodysplasia of stomach and duodenum                            without bleeding                           D50.0, Iron deficiency anemia secondary to blood                            loss (chronic)                           D50.9, Iron deficiency anemia, unspecified                           K92.2, Gastrointestinal hemorrhage, unspecified                           Q27.33, Arteriovenous malformation of digestive                            system vessel CPT copyright 2018 [employer]. All rights reserved. The codes documented in this report are preliminary and upon coder review may  be revised to meet current compliance requirements. Justice Britain, MD 09/16/2018 12:03:12 PM Number of Addenda: 0

## 2018-09-16 NOTE — Anesthesia Preprocedure Evaluation (Addendum)
Anesthesia Evaluation  Patient identified by MRN, date of birth, ID band Patient awake    Reviewed: Allergy & Precautions, NPO status , Patient's Chart, lab work & pertinent test results  History of Anesthesia Complications (+) PONV and history of anesthetic complications  Airway Mallampati: II  TM Distance: >3 FB Neck ROM: Full    Dental no notable dental hx. (+) Teeth Intact, Dental Advisory Given   Pulmonary neg pulmonary ROS, former smoker,    Pulmonary exam normal breath sounds clear to auscultation       Cardiovascular hypertension, Normal cardiovascular exam Rhythm:Regular Rate:Normal     Neuro/Psych negative neurological ROS  negative psych ROS   GI/Hepatic Neg liver ROS, GERD  ,  Endo/Other  negative endocrine ROS  Renal/GU negative Renal ROS  negative genitourinary   Musculoskeletal  (+) Arthritis ,   Abdominal   Peds  Hematology  (+) Blood dyscrasia, anemia ,   Anesthesia Other Findings Enteroscopy for iron deficiency anemia, heme positive stool, Hgb 9.9  Reproductive/Obstetrics negative OB ROS                           Anesthesia Physical Anesthesia Plan  ASA: III  Anesthesia Plan: MAC   Post-op Pain Management:    Induction: Intravenous  PONV Risk Score and Plan: 3 and Propofol infusion and Treatment may vary due to age or medical condition  Airway Management Planned: Natural Airway and Nasal Cannula  Additional Equipment:   Intra-op Plan:   Post-operative Plan:   Informed Consent: I have reviewed the patients History and Physical, chart, labs and discussed the procedure including the risks, benefits and alternatives for the proposed anesthesia with the patient or authorized representative who has indicated his/her understanding and acceptance.     Dental advisory given  Plan Discussed with: CRNA  Anesthesia Plan Comments:         Anesthesia Quick  Evaluation

## 2018-09-16 NOTE — Transfer of Care (Signed)
Immediate Anesthesia Transfer of Care Note  Patient: Amber Park  Procedure(s) Performed: ENTEROSCOPY (N/A ) SUBMUCOSAL TATTOO INJECTION HOT HEMOSTASIS (ARGON PLASMA COAGULATION/BICAP) (N/A ) HEMOSTASIS CLIP PLACEMENT  Patient Location: Endoscopy Unit  Anesthesia Type:MAC  Level of Consciousness: drowsy and patient cooperative  Airway & Oxygen Therapy: Patient Spontanous Breathing and Patient connected to nasal cannula oxygen  Post-op Assessment: Report given to RN, Post -op Vital signs reviewed and stable and Patient moving all extremities  Post vital signs: Reviewed and stable  Last Vitals:  Vitals Value Taken Time  BP    Temp    Pulse    Resp    SpO2      Last Pain:  Vitals:   09/16/18 1050  TempSrc: Oral  PainSc: 0-No pain         Complications: No apparent anesthesia complications

## 2018-09-16 NOTE — Interval H&P Note (Signed)
History and Physical Interval Note:  09/16/2018 11:07 AM  Amber Park  has presented today for surgery, with the diagnosis of IDA  The various methods of treatment have been discussed with the patient and family. After consideration of risks, benefits and other options for treatment, the patient has consented to  Procedure(s): ENTEROSCOPY (N/A) as a surgical intervention .  The patient's history has been reviewed, patient examined, no change in status, stable for surgery.  I have reviewed the patient's chart and labs.  Questions were answered to the patient's satisfaction.     [employer]

## 2018-09-17 NOTE — Anesthesia Postprocedure Evaluation (Signed)
Anesthesia Post Note  Patient: Amber Park  Procedure(s) Performed: ENTEROSCOPY (N/A ) SUBMUCOSAL TATTOO INJECTION HOT HEMOSTASIS (ARGON PLASMA COAGULATION/BICAP) (N/A ) HEMOSTASIS CLIP PLACEMENT     Patient location during evaluation: Endoscopy Anesthesia Type: MAC Level of consciousness: awake and alert Pain management: pain level controlled Vital Signs Assessment: post-procedure vital signs reviewed and stable Respiratory status: spontaneous breathing, nonlabored ventilation, respiratory function stable and patient connected to nasal cannula oxygen Cardiovascular status: blood pressure returned to baseline and stable Postop Assessment: no apparent nausea or vomiting Anesthetic complications: no    Last Vitals:  Vitals:   09/16/18 1210 09/16/18 1229  BP: (!) 171/62 (!) 163/62  Pulse: 86 90  Resp: 20 18  Temp:  36.4 C  SpO2: 98% 98%    Last Pain:  Vitals:   09/16/18 1229  TempSrc: Oral  PainSc:                  Chelsey L Woodrum

## 2018-09-21 DIAGNOSIS — I129 Hypertensive chronic kidney disease with stage 1 through stage 4 chronic kidney disease, or unspecified chronic kidney disease: Secondary | ICD-10-CM | POA: Diagnosis not present

## 2018-09-21 DIAGNOSIS — N183 Chronic kidney disease, stage 3 (moderate): Secondary | ICD-10-CM | POA: Diagnosis not present

## 2018-09-21 DIAGNOSIS — D500 Iron deficiency anemia secondary to blood loss (chronic): Secondary | ICD-10-CM | POA: Diagnosis not present

## 2018-09-23 ENCOUNTER — Telehealth: Payer: Self-pay | Admitting: Gastroenterology

## 2018-09-23 ENCOUNTER — Other Ambulatory Visit (INDEPENDENT_AMBULATORY_CARE_PROVIDER_SITE_OTHER): Payer: Medicare Other

## 2018-09-23 DIAGNOSIS — D509 Iron deficiency anemia, unspecified: Secondary | ICD-10-CM

## 2018-09-23 LAB — CBC WITH DIFFERENTIAL/PLATELET
Basophils Absolute: 0.1 K/uL (ref 0.0–0.1)
Basophils Relative: 1.1 % (ref 0.0–3.0)
Eosinophils Absolute: 0.2 K/uL (ref 0.0–0.7)
Eosinophils Relative: 2.1 % (ref 0.0–5.0)
HCT: 36.6 % (ref 36.0–46.0)
Hemoglobin: 11.6 g/dL — ABNORMAL LOW (ref 12.0–15.0)
Lymphocytes Relative: 15.4 % (ref 12.0–46.0)
Lymphs Abs: 1.2 K/uL (ref 0.7–4.0)
MCHC: 31.7 g/dL (ref 30.0–36.0)
MCV: 74.7 fl — ABNORMAL LOW (ref 78.0–100.0)
Monocytes Absolute: 0.6 K/uL (ref 0.1–1.0)
Monocytes Relative: 8 % (ref 3.0–12.0)
Neutro Abs: 5.9 K/uL (ref 1.4–7.7)
Neutrophils Relative %: 73.4 % (ref 43.0–77.0)
Platelets: 276 K/uL (ref 150.0–400.0)
RBC: 4.9 Mil/uL (ref 3.87–5.11)
RDW: 27.5 % — ABNORMAL HIGH (ref 11.5–15.5)
WBC: 8 K/uL (ref 4.0–10.5)

## 2018-09-23 NOTE — Telephone Encounter (Signed)
The pt has been advised that she will be contacted as soon as her results are reviewed. The pt has been advised of the information and verbalized understanding.

## 2018-09-30 ENCOUNTER — Other Ambulatory Visit: Payer: Self-pay

## 2018-09-30 ENCOUNTER — Ambulatory Visit (HOSPITAL_COMMUNITY)
Admit: 2018-09-30 | Discharge: 2018-09-30 | Disposition: A | Discharge status: Home / Self Care | Payer: Medicare Other | Source: Ambulatory Visit | Attending: Gastroenterology | Admitting: Gastroenterology

## 2018-09-30 DIAGNOSIS — D509 Iron deficiency anemia, unspecified: Secondary | ICD-10-CM | POA: Diagnosis not present

## 2018-09-30 MED ORDER — SODIUM CHLORIDE 0.9 % IV SOLN
INTRAVENOUS | Status: DC | PRN
  Administered 2018-09-30: 250 mL via INTRAVENOUS

## 2018-09-30 MED ORDER — SODIUM CHLORIDE 0.9 % IV SOLN
510.0000 mg | Freq: Once | INTRAVENOUS | Status: AC
  Administered 2018-09-30: 510 mg via INTRAVENOUS
  Filled 2018-09-30: qty 17

## 2018-09-30 NOTE — Discharge Instructions (Signed)

## 2018-09-30 NOTE — Progress Notes (Signed)
PATIENT CARE CENTER NOTE  Diagnosis: Iron Deficiency Anemia    Provider: Justice Britain, MD   Procedure: IV Feraheme    Note: Patient received Feraheme infusion. Tolerated well with no adverse reaction. Observed patient for 30 minutes post-infusion. Vital signs stable. Discharge instructions given. Patient alert, oriented and ambulatory at discharge.

## 2018-10-08 ENCOUNTER — Telehealth: Payer: Self-pay | Admitting: Gastroenterology

## 2018-10-08 NOTE — Telephone Encounter (Signed)
Pt returned your call and would like a call back after 4:00pm.

## 2018-10-08 NOTE — Telephone Encounter (Signed)
Tried to reach pt 2 times but continue to get hung up on. Will wait for return call from pt.

## 2018-10-08 NOTE — Telephone Encounter (Signed)
Pt called said that she has to have labs done next Wednesday with [place] and would like to know if she can have the labs done at the facility she is at on Tuesday and have them send it to [place] since she has not been out.  Barbaraann Share [phone]

## 2018-10-09 NOTE — Telephone Encounter (Signed)
The pt was advised that labs need to be completed closer to her appt on 3/31. The order in is Epic.

## 2018-10-12 ENCOUNTER — Telehealth: Payer: Self-pay | Admitting: Gastroenterology

## 2018-10-12 NOTE — Telephone Encounter (Signed)
Pt called stating that she was returning your call and that you were playing phone tag for the past three days. Pls call her.

## 2018-10-12 NOTE — Telephone Encounter (Signed)
Will fax Amber Park lab order Phone number [phone] call on Monday to fax lab order for IBC, CBC, Ferritin to be done a few days prior to 3/31 appt

## 2018-10-12 NOTE — Telephone Encounter (Signed)
Left message on machine to call back  

## 2018-10-13 ENCOUNTER — Telehealth: Payer: Self-pay | Admitting: Gastroenterology

## 2018-10-13 NOTE — Telephone Encounter (Signed)
Pt called in wanting to [facility] so that her labs can be drawn if the orders can be faxed over. The fax number is [phone] [id number]

## 2018-10-13 NOTE — Telephone Encounter (Signed)
Order faxed to [facility]

## 2018-10-15 DIAGNOSIS — D509 Iron deficiency anemia, unspecified: Secondary | ICD-10-CM | POA: Diagnosis not present

## 2018-10-19 ENCOUNTER — Telehealth: Payer: Self-pay | Admitting: Gastroenterology

## 2018-10-19 ENCOUNTER — Telehealth: Payer: Self-pay

## 2018-10-19 NOTE — Telephone Encounter (Signed)
I have been able to review the results from [facility]  WBC 6.3 Hemoglobin 12.5 Hematocrit 39.5 MCV 80.4 Platelet count 279 Iron over TIBC 79/257 for a 31% iron saturation Ferritin 287  We will have these records scanned into the chart.  Blood counts look to be stabilizing and are even better than before.  I would make sure she continues to take her iron supplement daily. She should be seen in follow-up with me via telehealth as able in the next 3 to 4 weeks. Thank you.

## 2018-10-19 NOTE — Telephone Encounter (Signed)
Called pt x3. Line is busy. Will try calling pt again later this afternoon.      I have been able to review the results from [facility]  WBC 6.3 Hemoglobin 12.5 Hematocrit 39.5 MCV 80.4 Platelet count 279 Iron over TIBC 79/257 for a 31% iron saturation Ferritin 287  We will have these records scanned into the chart.  Blood counts look to be stabilizing and are even better than before.  I would make sure she continues to take her iron supplement daily. She should be seen in follow-up with me via telehealth as able in the next 3 to 4 weeks. Thank you.  GM

## 2018-10-19 NOTE — Telephone Encounter (Signed)
Pt had lab work at [facility] at [place] and they were suppose to send it over to [place]. PT would like to know if we received it and what were the results.

## 2018-10-20 ENCOUNTER — Ambulatory Visit: Payer: Medicare Other | Admitting: Gastroenterology

## 2018-10-20 ENCOUNTER — Telehealth: Payer: Self-pay | Admitting: Gastroenterology

## 2018-10-20 NOTE — Telephone Encounter (Signed)
Pt called in requesting to speak with patty. She stated that her phone is set up to not be able to answer rotary calls so she keep missing calls. She would like for you to just send her the lab report and instructions the doctor wants her to follow.

## 2018-10-20 NOTE — Telephone Encounter (Signed)
Lab result with comments mailed to pt per her request.

## 2018-10-26 NOTE — Telephone Encounter (Signed)
Mr Amber Park tells me that [facility] should have sent lab results to [place] from 10-15-18 and he is requesting a call back with those results to his cell phone # [phone].

## 2018-10-26 NOTE — Telephone Encounter (Signed)
The pt was advised we will call as soon as Dr Ardis Hughs reviews labs.  She is aware that Dr Ardis Hughs is off until Monday.

## 2018-10-26 NOTE — Telephone Encounter (Signed)
Pt's son Herbie Baltimore requested a call to discuss results from lab done March 26 at [facility].

## 2018-10-26 NOTE — Telephone Encounter (Signed)
Please call Amber Park back.

## 2018-11-03 NOTE — Telephone Encounter (Signed)
The pt was given the lab information and appt made to speak with Dr Rush Landmark. Amber Park I spoke with the pt. Looks like you tried to reach her.

## 2018-11-03 NOTE — Telephone Encounter (Signed)
Pt's son Mortimer Fries called to discuss results.  He stated that he has not received the letter.

## 2018-11-04 ENCOUNTER — Telehealth: Payer: Self-pay | Admitting: Gastroenterology

## 2018-11-04 DIAGNOSIS — D509 Iron deficiency anemia, unspecified: Secondary | ICD-10-CM

## 2018-11-05 NOTE — Telephone Encounter (Signed)
I called and spoke with the patient's son on 11/04/2018 to give him the results of the patient's hemoglobin hematocrit as well as other laboratory indices.  Iron indices have significantly improved.  We will plan to have the patient continue with her current status and plan on her next appointment to actually just be set up as a day to get labs.  Since she is at a facility and was previously able to get labs we can work on her getting labs at the facility. I would like a CBC, iron/TIBC/ferritin to be performed and once those return we can consider the timing of a telemedicine visit. Thank you. GM

## 2018-11-05 NOTE — Telephone Encounter (Signed)
Lab order faxed to [place] at [facility] at [phone]

## 2018-11-11 ENCOUNTER — Other Ambulatory Visit: Payer: Self-pay | Admitting: Gastroenterology

## 2018-11-11 MED ORDER — PANTOPRAZOLE SODIUM 40 MG PO TBEC: 40.0000 mg | DELAYED_RELEASE_TABLET | Freq: Every day | ORAL | 3 refills | Status: DC

## 2018-11-11 NOTE — Telephone Encounter (Signed)
Informed patient Dr. Rush Landmark wanted her to take Protonix 40 mg twice daily x 2 months and then reduce to once daily after that. Informed patient that I will send in the new prescription as Protonix daily for 90 day supply. Patient verbalized understanding.

## 2018-11-18 DIAGNOSIS — R109 Unspecified abdominal pain: Secondary | ICD-10-CM | POA: Diagnosis not present

## 2018-11-18 DIAGNOSIS — D500 Iron deficiency anemia secondary to blood loss (chronic): Secondary | ICD-10-CM | POA: Diagnosis not present

## 2018-11-18 DIAGNOSIS — N183 Chronic kidney disease, stage 3 (moderate): Secondary | ICD-10-CM | POA: Diagnosis not present

## 2018-11-18 DIAGNOSIS — I129 Hypertensive chronic kidney disease with stage 1 through stage 4 chronic kidney disease, or unspecified chronic kidney disease: Secondary | ICD-10-CM | POA: Diagnosis not present

## 2018-12-01 DIAGNOSIS — D509 Iron deficiency anemia, unspecified: Secondary | ICD-10-CM | POA: Diagnosis not present

## 2018-12-07 ENCOUNTER — Encounter: Payer: Self-pay | Admitting: General Surgery

## 2018-12-08 ENCOUNTER — Other Ambulatory Visit: Payer: Self-pay

## 2018-12-08 ENCOUNTER — Telehealth: Payer: Self-pay | Admitting: Gastroenterology

## 2018-12-08 ENCOUNTER — Ambulatory Visit: Payer: Medicare Other | Admitting: Gastroenterology

## 2018-12-08 NOTE — Telephone Encounter (Signed)
Tried to reach pt but the line hung up as soon as I put the number in to prevent robo calls.     Amber Park, I have released the results to the patient via [place]. Can we please try and get the patient continued Iron supplementation and plan 2-days before clinic to have Iron/TIBC/Ferritin/CBC performed? Thanks.

## 2018-12-08 NOTE — Telephone Encounter (Signed)
[name],   I requested and recd labs this afternoon from [facility] [place] have been given to Dr.Mansouraty for review.

## 2018-12-08 NOTE — Progress Notes (Unsigned)
[facility] VISIT   Primary Care Provider Lajean Manes, MD [address] [place] [address] [phone]  Referring Provider Lajean Manes, MD [address] [place], [place] [address] [phone]  Patient Profile: Amber Park is a 85 y.o. female with a pmh significant for  The patient presents to the [facility] for an evaluation and management of problem(s) noted below:  Problem List No diagnosis found.  History of Present Illness    The patient does/does not take NSAIDs or BC/Goody Powder. Patient has/has not had an EGD. Patient has/has not had a Colonoscopy.  GI Review of Systems Positive as above Negative for  Pyrosis; Reflux; Regurgitation; Dysphagia; Odynophagia; Globus; Post-prandial cough; Nocturnal cough; Nasal regurgitation; Epigastric pain; Nausea; Vomiting; Hematemesis; Jaundice; Change in Appetite; Early satiety; Abdominal pain; Abdominal bloating; Eructation; Flatulence; Change in BM Frequency; Change in BM Consistency; Constipation; Diarrhea; Incontinence; Urgency; Tenesmus; Hematochezia; Melena  Review of Systems General: Denies fevers/chills/weight loss/night sweats HEENT: Denies oral lesions/sore throat/headaches/visual changes Cardiovascular: Denies chest pain/palpitations Pulmonary: Denies shortness of breath/cough Gastroenterological: See HPI Genitourinary: Denies darkened urine or hematuria Hematological: Denies easy bruising/bleeding Endocrine: Denies temperature intolerance Dermatological: Denies skin changes Psychological: Mood is stable Allergy & Immunology: Denies severe allergic reactions Musculoskeletal: Denies new arthralgias   Medications Current Outpatient Medications  Medication Sig Dispense Refill  . acetaminophen (TYLENOL) 325 MG tablet Take 2 tablets (650 mg total) by mouth every 6 (six) hours as needed for mild pain (or Fever >/= 101).    Marland Kitchen  amLODipine (NORVASC) 10 MG tablet Take 10 mg by mouth daily.    Marland Kitchen FERREX 150 150 MG capsule Take 1 capsule (150 mg total) by mouth daily for 30 days. 30 capsule 0  . latanoprost (XALATAN) 0.005 % ophthalmic solution Place 1 drop into both eyes at bedtime.     . pantoprazole (PROTONIX) 40 MG tablet Take 1 tablet (40 mg total) by mouth daily. 90 tablet 3  . tiZANidine (ZANAFLEX) 4 MG tablet Take 1 tablet (4 mg total) by mouth every 8 (eight) hours as needed for muscle spasms. 30 tablet 0   No current facility-administered medications for this visit.     Allergies Allergies  Allergen Reactions  . Meperidine Hcl     REACTION: N/V    Histories Past Medical History:  Diagnosis Date  . Anxiety   . Arthritis   . Cancer (HCC)    hx of skin cancer removed from nose   . GERD (gastroesophageal reflux disease)   . History of blood transfusion 1963  . History of left bundle branch block    old ekg 06-23-11 dr Felipa Eth on chart  . Pneumonia    hx of pneumonia x 4   . PONV (postoperative nausea and vomiting)   . Shortness of breath dyspnea    with exertion    Past Surgical History:  Procedure Laterality Date  . ABDOMINAL HYSTERECTOMY    . BACK SURGERY     x 2  . BREAST SURGERY     bilateral cyst removal   . ENTEROSCOPY N/A 09/16/2018   Procedure: ENTEROSCOPY;  Surgeon: Rush Landmark Telford Nab., MD;  Location: [facility];  Service: Gastroenterology;  Laterality: N/A;  . HOT HEMOSTASIS N/A 09/16/2018   Procedure: HOT HEMOSTASIS (ARGON PLASMA COAGULATION/BICAP);  Surgeon: Irving Copas., MD;  Location: [facility];  Service: Gastroenterology;  Laterality: N/A;  . TONSILLECTOMY    . TOTAL HIP ARTHROPLASTY Left 05/31/2014   Procedure: LEFT  TOTAL HIP ARTHROPLASTY ANTERIOR APPROACH;  Surgeon: Mauri Pole, MD;  Location: [facility];  Service: Orthopedics;  Laterality: Left;   Social History   Socioeconomic History  . Marital status: Widowed    Spouse name: Not on file  . Number  of children: 3  . Years of education: Not on file  . Highest education level: Not on file  Occupational History  . Occupation: Retired Designer, multimedia at [employer]  . Financial resource strain: Not on file  . Food insecurity:    Worry: Not on file    Inability: Not on file  . Transportation needs:    Medical: Not on file    Non-medical: Not on file  Tobacco Use  . Smoking status: Former Smoker    Packs/day: 0.30    Types: Cigarettes    Start date: 1955    Last attempt to quit: 2005    Years since quitting: 15.3  . Smokeless tobacco: Never Used  Substance and Sexual Activity  . Alcohol use: Not Currently    Comment: rare   . Drug use: No  . Sexual activity: Not on file  Lifestyle  . Physical activity:    Days per week: Not on file    Minutes per session: Not on file  . Stress: Not on file  Relationships  . Social connections:    Talks on phone: Not on file    Gets together: Not on file    Attends religious service: Not on file    Active member of club or organization: Not on file    Attends meetings of clubs or organizations: Not on file    Relationship status: Not on file  . Intimate partner violence:    Fear of current or ex partner: Not on file    Emotionally abused: Not on file    Physically abused: Not on file    Forced sexual activity: Not on file  Other Topics Concern  . Not on file  Social History Narrative  . Not on file   Family History  Problem Relation Age of Onset  . Colon cancer Mother 84       Died age 47  . Heart attack Father 16       Died in his sleep of an MI  . Lung cancer Sister        LLL resection, otherwise no treatment; now recurred  . Bladder Cancer Sister        Likely primary  . Renal cancer Sister   . Breast cancer Sister   . Heart disease Brother   . Heart disease Brother    I have reviewed her medical, social, and family history in detail and updated the electronic medical record as necessary.     PHYSICAL EXAMINATION  There were no vitals taken for this visit. Wt Readings from Last 3 Encounters:  12/07/18 130 lb (59 kg)  09/14/18 145 lb (65.8 kg)  12/18/16 146 lb (66.2 kg)   GEN: NAD, appears stated age, doesn't appear chronically ill PSYCH: Cooperative, without pressured speech EYE: Conjunctivae pink, sclerae anicteric ENT: MMM, without oral ulcers, no erythema or exudates noted NECK: Supple CV: RR without R/Gs  RESP: CTAB posteriorly, without wheezing GI: NABS, soft, NT/ND, without rebound or guarding, no HSM appreciated GU: DRE shows MSK/EXT: _ edema, no palmar erythema SKIN: No jaundice, no spider angiomata, no concerning rashes NEURO:  Alert & Oriented x 3, no focal deficits, no evidence of  asterixis   REVIEW OF DATA  I reviewed the following data at the time of this encounter:  GI Procedures and Studies  ***  Laboratory Studies  ***  Imaging Studies  ***   ASSESSMENT  Ms. Madura is a 85 y.o. female with a pmh significant for The patient is seen today for evaluation and management of:  No diagnosis found.  ***   PLAN  There are no diagnoses linked to this encounter.   No orders of the defined types were placed in this encounter.   New Prescriptions   No medications on file   Modified Medications   No medications on file    Planned Follow Up No follow-ups on file.   Justice Britain, MD [facility] Office # [phone]

## 2018-12-08 NOTE — Telephone Encounter (Signed)
Patient's labs are reviewed today and will be scanned in the chart  WBC 8.3 Hemoglobin 14.5 Hematocrit 43.2 MCV 82.1 Platelet 347 Iron over TIBC 59/260 Percent saturation 23 Ferritin 84  Laboratories look good at this point in time. Would like to talk with the patient via telephone or with her son in the coming weeks. We will reschedule the appointment that was missed today.  But shoot for a follow-up in approximately 6 weeks.  Patient should have labs done once again and I would do a CBC/iron/TIBC/ferritin done at her facility and sent to [place] for review prior to her visit.  These notes will be scanned into the chart  Justice Britain, MD [facility] Office # [phone]

## 2018-12-08 NOTE — Telephone Encounter (Signed)
Pt missed telemedicine appt with Dr. Rush Landmark today, pt states that she thought that it was a robocall, Dr. Rush Landmark next available appt is not until 6/4 so pt wants to know if somebody can give her her lab results. Pls call her.

## 2018-12-09 NOTE — Telephone Encounter (Signed)
Have tried to call the pt several times and every time I enter the number to prevent ROBO calls the line hangs up.

## 2018-12-09 NOTE — Telephone Encounter (Signed)
The pt has been scheduled for 6/4 at 230 pm she is aware to answer the call and if there is a problem with the line she will call our office.

## 2018-12-24 ENCOUNTER — Ambulatory Visit: Payer: Medicare Other | Admitting: Gastroenterology

## 2019-01-14 ENCOUNTER — Ambulatory Visit (INDEPENDENT_AMBULATORY_CARE_PROVIDER_SITE_OTHER): Payer: Medicare Other | Admitting: Gastroenterology

## 2019-01-14 VITALS — Wt 130.0 lb

## 2019-01-14 DIAGNOSIS — R140 Abdominal distension (gaseous): Secondary | ICD-10-CM

## 2019-01-14 DIAGNOSIS — R1030 Lower abdominal pain, unspecified: Secondary | ICD-10-CM

## 2019-01-14 DIAGNOSIS — K257 Chronic gastric ulcer without hemorrhage or perforation: Secondary | ICD-10-CM

## 2019-01-14 DIAGNOSIS — K449 Diaphragmatic hernia without obstruction or gangrene: Secondary | ICD-10-CM | POA: Diagnosis not present

## 2019-01-14 DIAGNOSIS — D500 Iron deficiency anemia secondary to blood loss (chronic): Secondary | ICD-10-CM

## 2019-01-14 DIAGNOSIS — K5520 Angiodysplasia of colon without hemorrhage: Secondary | ICD-10-CM

## 2019-01-14 NOTE — Progress Notes (Signed)
[place] VISIT   Primary Care Provider Lajean Manes, MD [address] [place] [address] [phone]  Patient Profile: Amber Park is a 85 y.o. 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  The patient presents to the [facility] for an evaluation and management of problem(s) noted below:  Problem List 1. Iron deficiency anemia due to chronic blood loss   2. Cameron lesion, chronic   3. AVM (arteriovenous malformation) of small bowel, acquired   4. Hiatal hernia   5. AVM (arteriovenous malformation) of colon   6. Lower abdominal pain   7. Abdominal bloating     I connected with  Cherie Ouch on 01/14/19. I verified that I was speaking with the correct person using two identifiers. Due to the COVID-19 Pandemic, this service was provided via telemedicine using audio only. Interactive audio and video telecommunications were attempted between this provider and patient, however failed, due to patient having no access to video capability and thus to provide timely and excellent care, we continued and completed visit with audio only. The patient was located at home. The provider was located in the office. The patient did consent to this visit and is aware of charges through their insurance as well as the limitations of evaluation and management by telemedicine. Other persons participating in this telemedicine service were none. Time spent on visit was 27 minutes on phone and in discussion with patient about prior history and review of recent enteroscopy and coordination of care.   History of Present Illness Please see initial consultation note from hospitalization in February for full details of recent HPI.  In brief, this is a patient who has known  chronic iron deficiency that was felt secondary to small bowel angiectasia's and Cameron's lesions in the setting of a large hiatal hernia.  She had been intolerant to most oral iron supplementation and had previously been on IV iron.  When she was admitted in February she was admitted with iron deficiency anemia and occult blood positivity.  She underwent an enteroscopy with findings of Cameron's lesions and angiectasia's that were treated.  She was started on a different oral iron supplementation as well as given IV iron.  She was discharged.  Her hemoglobin has been monitored through her living and has been improving.  Interval History Overall, the patient has been doing relatively well.  Her energy levels have been improving since her hospitalization.  She has been upset by her inability to go out on the world and do her aerobics and exercise but understands the situation as a result of the COVID-19 pandemic.  However, over the course of the last few weeks the patient has been experiencing some stomach upset.  She is concerned whether this could be a result of her iron supplementation as she is had issues with iron in the past.  She is been experiencing increased amount of gas as well as bloating and distention throughout the abdomen.  She has discomfort in her lower abdomen bilaterally.  She continues to have dark stools which she is attributing to her oral iron supplementation.  She denies any hematochezia or maroon stool.  Her bowel movements require her to take a laxative, MiraLAX, on a daily basis to help her move daily.  If she has a bowel movement this will help the discomfort approximately 50%  of the time.  However over the course of the day after a bowel movement even if she had improvement the discomfort will return.  She is not having increased eructation.  Weight per her report is stable.    GI Review of Systems Positive as above including medically controlled pyrosis Negative for dysphagia,  odynophagia, nausea, vomiting, hematemesis, decreased appetite  Review of Systems General: Denies fevers/chills HEENT: Denies oral lesions Cardiovascular: Denies chest pain Pulmonary: Denies shortness of breath/nocturnal cough Gastroenterological: See HPI Genitourinary: Denies darkened urine or hematuria Hematological: Positive for history of easy bruising/bleeding Dermatological: Denies jaundice Psychological: Mood is stable   Medications Current Outpatient Medications  Medication Sig Dispense Refill   acetaminophen (TYLENOL) 325 MG tablet Take 2 tablets (650 mg total) by mouth every 6 (six) hours as needed for mild pain (or Fever >/= 101).     amLODipine (NORVASC) 10 MG tablet Take 10 mg by mouth daily.     FERREX 150 150 MG capsule Take 1 capsule (150 mg total) by mouth daily for 30 days. 30 capsule 0   latanoprost (XALATAN) 0.005 % ophthalmic solution Place 1 drop into both eyes at bedtime.      pantoprazole (PROTONIX) 40 MG tablet Take 1 tablet (40 mg total) by mouth daily. 90 tablet 3   tiZANidine (ZANAFLEX) 4 MG tablet Take 1 tablet (4 mg total) by mouth every 8 (eight) hours as needed for muscle spasms. 30 tablet 0   No current facility-administered medications for this visit.     Allergies Allergies  Allergen Reactions   Meperidine Hcl     REACTION: N/V    Histories Past Medical History:  Diagnosis Date   Anxiety    Arthritis    Cancer ([place])    hx of skin cancer removed from nose    GERD (gastroesophageal reflux disease)    History of blood transfusion 1963   History of left bundle branch block    old ekg 06-23-11 dr Felipa Eth on chart   Pneumonia    hx of pneumonia x 4    PONV (postoperative nausea and vomiting)    Shortness of breath dyspnea    with exertion    Past Surgical History:  Procedure Laterality Date   ABDOMINAL HYSTERECTOMY     BACK SURGERY     x 2   BREAST SURGERY     bilateral cyst removal    ENTEROSCOPY N/A  09/16/2018   Procedure: ENTEROSCOPY;  Surgeon: Irving Copas., MD;  Location: [facility];  Service: Gastroenterology;  Laterality: N/A;   HOT HEMOSTASIS N/A 09/16/2018   Procedure: HOT HEMOSTASIS (ARGON PLASMA COAGULATION/BICAP);  Surgeon: Irving Copas., MD;  Location: [facility];  Service: Gastroenterology;  Laterality: N/A;   TONSILLECTOMY     TOTAL HIP ARTHROPLASTY Left 05/31/2014   Procedure: LEFT TOTAL HIP ARTHROPLASTY ANTERIOR APPROACH;  Surgeon: Mauri Pole, MD;  Location: [facility];  Service: Orthopedics;  Laterality: Left;   Social History   Socioeconomic History   Marital status: Widowed    Spouse name: Not on file   Number of children: 3   Years of education: Not on file   Highest education level: Not on file  Occupational History   Occupation: Retired Designer, multimedia at [employer] resource strain: Not on file   Food insecurity    Worry: Not on file    Inability: Not on file   Transportation needs    Medical: Not on file  Non-medical: Not on file  Tobacco Use   Smoking status: Former Smoker    Packs/day: 0.30    Types: Cigarettes    Start date: 70    Quit date: 2005    Years since quitting: 15.4   Smokeless tobacco: Never Used  Substance and Sexual Activity   Alcohol use: Not Currently    Comment: rare    Drug use: No   Sexual activity: Not on file  Lifestyle   Physical activity    Days per week: Not on file    Minutes per session: Not on file   Stress: Not on file  Relationships   Social connections    Talks on phone: Not on file    Gets together: Not on file    Attends religious service: Not on file    Active member of club or organization: Not on file    Attends meetings of clubs or organizations: Not on file    Relationship status: Not on file   Intimate partner violence    Fear of current or ex partner: Not on file    Emotionally abused: Not on file    Physically abused:  Not on file    Forced sexual activity: Not on file  Other Topics Concern   Not on file  Social History Narrative   Not on file   Family History  Problem Relation Age of Onset   Colon cancer Mother 39       Died age 25   Heart attack Father 21       Died in his sleep of an MI   Lung cancer Sister        LLL resection, otherwise no treatment; now recurred   Bladder Cancer Sister        Likely primary   Renal cancer Sister    Breast cancer Sister    Heart disease Brother    Heart disease Brother    Esophageal cancer Neg Hx    Inflammatory bowel disease Neg Hx    Liver disease Neg Hx    Pancreatic cancer Neg Hx    Stomach cancer Neg Hx    I have reviewed her medical, social, and family history in detail and updated the electronic medical record as necessary.    PHYSICAL EXAMINATION  Telehealth visit   REVIEW OF DATA  I reviewed the following data at the time of this encounter:  GI Procedures and Studies  February 2020 SBE - No gross lesions in proximal/middle esophagus. LA Grade B esophagitis in distal esophagus. - Large hiatal hernia. Erosions without bleeding - consistent with Cameron's lesions present.. - A single non-bleeding angioectasia in the stomach. Treated with argon plasma coagulation (APC). - No other gross lesions in the stomach. - Five non-bleeding angioectasias in the duodenum. Treated with argon plasma coagulation (APC). 2 clips (MR conditional) were placed on 1 angioectasia which had oozed initially during first APC but stopped after further APC for additional hemostasis. - Normal mucosa was found in the entire examined duodenum otherwise. - Normal mucosa was found in the proximal jejunum. Tattooed distal extent of SBE.  2010 colonoscopy 2 cecal angiectasias  Laboratory Studies  Reviewed in epic and previously documented notes Last hemoglobin from May was 14.5 with an iron saturation of 23% and ferritin of 84  Imaging Studies  No  relevant imaging studies to review   ASSESSMENT  Ms. Wilbert is a 85 y.o. 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  The patient is seen today for evaluation and management of:  1. Iron deficiency anemia due to chronic blood loss   2. Cameron lesion, chronic   3. AVM (arteriovenous malformation) of small bowel, acquired   4. Hiatal hernia   5. AVM (arteriovenous malformation) of colon   6. Lower abdominal pain   7. Abdominal bloating    Overall, the patient is hemodynamically doing well with her current regimen of PPI and iron supplementation in regards to anemia and iron deficiency.  She has normalized her hemoglobin and iron studies as of last month.  However, it seems like she may be beginning to not tolerate oral iron based on some of the symptoms she is experiencing.  It is entirely possible that she could also have SIBO or EPI and we will need to consider that down the road.  I am willing for [place] to decrease her iron supplementation from daily to every other day and monitor symptoms.  In the setting of her clinical situation if she ends up still having issues in a couple weeks after being on oral iron only every other day and we may consider stopping her iron completely.  I would like to see what her iron studies do in about symmetrically a month to see if we are able to maintain her or not.  She may require periodic IV iron transfusions.  We also have not looked at the colon in years and she previously had 2 cecal AVMs and so it is possible that she may have more but thankfully the iron has been doing its job.  We are holding on colonoscopy for now based on her age but may consider it should the issue of iron deficiency recur.  If the patient continues to have issues with her gas/bloating we will consider further work-up and consider the use of other medications  such as simethicone.  We will follow-up with patient in approximately 4 to 5 weeks but she will call us back approximately 3 weeks.  All patient questions were answered, to the best of my ability, and the patient agrees to the aforementioned plan of action with follow-up as indicated.   PLAN  Repeat iron indices and CBC in 1 month Cut oral iron to every other day and monitor symptoms Patient to call us in approximately 2-1/2 to 3 weeks to let us know how symptoms are with decreased iron supplementation Continue MiraLAX 1-2 times daily Continue adequate hydration If patient continuing to have abdominal discomfort and bloating and gas after her call back then would stop oral iron supplementation completely and monitor symptoms at that point We will consider simethicone and Beano in future if issues persist We will consider SIBO/EPI evaluation as well   Orders Placed This Encounter  Procedures   CBC   IBC + Ferritin    New Prescriptions   No medications on file   Modified Medications   No medications on file    Planned Follow Up Return in about 6 weeks (around 02/25/2019).   Justice Britain, MD [facility] Office # [phone]

## 2019-01-14 NOTE — Patient Instructions (Signed)
Your provider has requested that you go to the basement level for lab work before leaving today. Press "B" on the elevator. The lab is located at the first door on the left as you exit the elevator.  Order will be faxed to [facility]- assisted living. D  Decease your iron supplements to every other day.   Call us in 3 weeks to inform us of gas/discomfort , if still having symptoms we will discontinue iron.   Thank you for choosing me and [facility].  Dr. Rush Landmark

## 2019-01-16 ENCOUNTER — Encounter: Payer: Self-pay | Admitting: Gastroenterology

## 2019-01-16 DIAGNOSIS — R140 Abdominal distension (gaseous): Secondary | ICD-10-CM | POA: Insufficient documentation

## 2019-01-16 DIAGNOSIS — R1030 Lower abdominal pain, unspecified: Secondary | ICD-10-CM | POA: Insufficient documentation

## 2019-01-16 DIAGNOSIS — K449 Diaphragmatic hernia without obstruction or gangrene: Secondary | ICD-10-CM | POA: Insufficient documentation

## 2019-01-16 DIAGNOSIS — K257 Chronic gastric ulcer without hemorrhage or perforation: Secondary | ICD-10-CM | POA: Insufficient documentation

## 2019-01-16 DIAGNOSIS — K5520 Angiodysplasia of colon without hemorrhage: Secondary | ICD-10-CM | POA: Insufficient documentation

## 2019-01-27 ENCOUNTER — Other Ambulatory Visit: Payer: Medicare Other

## 2019-01-27 ENCOUNTER — Telehealth: Payer: Self-pay | Admitting: *Deleted

## 2019-01-27 DIAGNOSIS — R6889 Other general symptoms and signs: Secondary | ICD-10-CM | POA: Diagnosis not present

## 2019-01-27 DIAGNOSIS — Z20822 Contact with and (suspected) exposure to covid-19: Secondary | ICD-10-CM

## 2019-01-27 NOTE — Telephone Encounter (Signed)
Amber Park from Dr. Carlyle Lipa office called to refer this patient for covid-19 testing.  The patient was notified and scheduled for today at the [facility] at 2:30. Advised that this is a drive thru test site, so stay in car with mask on and window rolled up until ready for testing. Pt voiced understanding.

## 2019-01-28 ENCOUNTER — Telehealth: Payer: Self-pay | Admitting: Gastroenterology

## 2019-01-28 DIAGNOSIS — D500 Iron deficiency anemia secondary to blood loss (chronic): Secondary | ICD-10-CM

## 2019-01-28 NOTE — Telephone Encounter (Signed)
Pt called to give an update on her health condition.

## 2019-01-28 NOTE — Telephone Encounter (Signed)
Attempted to return the call.  "YOur call cannot be completed as dialed".

## 2019-02-01 NOTE — Telephone Encounter (Signed)
Spoke with the pt and she wanted to give an update on her condition since decreasing iron.  She says her abd pain has decreased but has not resolved.  It is much better however.  She would like to know if she needs labs.

## 2019-02-01 NOTE — Telephone Encounter (Signed)
Amber Park, is she taking it once daily or every other day? GM

## 2019-02-01 NOTE — Telephone Encounter (Signed)
She is taking her iron daily.

## 2019-02-01 NOTE — Telephone Encounter (Signed)
Thanks. Let her continue that for now. Let her come in for labs in last week of July or first week of August to see her CBC/Iron indices. Thanks. GM

## 2019-02-02 LAB — NOVEL CORONAVIRUS, NAA: SARS-CoV-2, NAA: NOT DETECTED

## 2019-02-02 NOTE — Telephone Encounter (Signed)
Lab order faxed to [facility] at [phone] Attention Tonika.

## 2019-02-25 DIAGNOSIS — D631 Anemia in chronic kidney disease: Secondary | ICD-10-CM | POA: Diagnosis not present

## 2019-02-25 DIAGNOSIS — N189 Chronic kidney disease, unspecified: Secondary | ICD-10-CM | POA: Diagnosis not present

## 2019-03-08 ENCOUNTER — Telehealth: Payer: Self-pay | Admitting: Gastroenterology

## 2019-03-08 NOTE — Telephone Encounter (Signed)
I have not seen any labs. I checked Dr. Rush Landmark box and there is no labs in his inbox either.

## 2019-03-08 NOTE — Telephone Encounter (Signed)
Rovanda have you seen any outside lab results for this patient?

## 2019-03-08 NOTE — Telephone Encounter (Signed)
The pt has been advised that we have not received any results from the outside lab.  She will call and have them fax to our office

## 2019-03-15 ENCOUNTER — Telehealth: Payer: Self-pay | Admitting: Gastroenterology

## 2019-03-15 NOTE — Telephone Encounter (Signed)
Ro, did you ever see lab results come over from the pt assisted living facility?

## 2019-03-15 NOTE — Telephone Encounter (Signed)
Pt inquired about results from blood works done February 25, 2019.

## 2019-03-17 NOTE — Telephone Encounter (Signed)
No labs, I have looked on both faxes, and inbox of Dr. Rush Landmark.

## 2019-03-17 NOTE — Telephone Encounter (Signed)
I spoke with the pt's son and gave him the fax number to make sure the facility is faxing to the correct number.  Ro, I had them make attention to you so that you can put that on Dr [employer] desk.

## 2019-03-17 NOTE — Telephone Encounter (Signed)
Ok, I will be looking out for them.

## 2019-03-23 ENCOUNTER — Telehealth: Payer: Self-pay | Admitting: Gastroenterology

## 2019-03-23 NOTE — Telephone Encounter (Signed)
Any sign of the labs yet Rovanda?

## 2019-03-23 NOTE — Telephone Encounter (Signed)
Yes, they came today. I placed them on Dr. Rush Landmark desk. He said that he would review and send it back to me.

## 2019-03-24 DIAGNOSIS — I129 Hypertensive chronic kidney disease with stage 1 through stage 4 chronic kidney disease, or unspecified chronic kidney disease: Secondary | ICD-10-CM | POA: Diagnosis not present

## 2019-03-24 DIAGNOSIS — Z23 Encounter for immunization: Secondary | ICD-10-CM | POA: Diagnosis not present

## 2019-03-24 DIAGNOSIS — N183 Chronic kidney disease, stage 3 (moderate): Secondary | ICD-10-CM | POA: Diagnosis not present

## 2019-03-24 NOTE — Telephone Encounter (Signed)
Great thank you!

## 2019-04-15 ENCOUNTER — Other Ambulatory Visit: Payer: Self-pay

## 2019-04-15 DIAGNOSIS — D500 Iron deficiency anemia secondary to blood loss (chronic): Secondary | ICD-10-CM

## 2019-04-15 NOTE — Telephone Encounter (Signed)
I have spoken with pt and she is aware that she needs to have labs in 3 weeks.  I faxed the lab order to her facility at [place]

## 2019-04-15 NOTE — Telephone Encounter (Signed)
February 25, 2019 labs  Hemoglobin 13.4 Hematocrit 40.5 WBC 9.2 Platelet 338 MCV 83.5 Iron/TIBC 38/251 15% iron saturation Ferritin 54  I think we should see how the patient's labs look in the middle of October. If we are seeing that her iron levels or blood counts are decreasing we will likely need to put her back on IV iron infusions periodically. Looks like she was doing better with being on every other day iron for her GI symptoms.  Lets send an order to her facility for labs to be drawn in 3 weeks. CBC/iron/TIBC/ferritin.  Thanks. GM

## 2019-05-03 DIAGNOSIS — N1831 Chronic kidney disease, stage 3a: Secondary | ICD-10-CM | POA: Diagnosis not present

## 2019-05-03 DIAGNOSIS — I129 Hypertensive chronic kidney disease with stage 1 through stage 4 chronic kidney disease, or unspecified chronic kidney disease: Secondary | ICD-10-CM | POA: Diagnosis not present

## 2019-05-04 DIAGNOSIS — D500 Iron deficiency anemia secondary to blood loss (chronic): Secondary | ICD-10-CM | POA: Diagnosis not present

## 2019-05-13 ENCOUNTER — Telehealth: Payer: Self-pay | Admitting: Gastroenterology

## 2019-05-13 NOTE — Telephone Encounter (Signed)
Pt requested a call back to discuss "a report from Dr. Rush Landmark."

## 2019-05-13 NOTE — Telephone Encounter (Signed)
Left message on machine to call back  

## 2019-05-13 NOTE — Telephone Encounter (Signed)
Rovanda have you seen the lab results from [facility]?

## 2019-05-17 NOTE — Telephone Encounter (Signed)
[name] labs came today via fax. I have placed them in Dr Mansouraty's inbox in his office.

## 2019-05-17 NOTE — Telephone Encounter (Signed)
Awesome thank you!

## 2019-05-24 NOTE — Telephone Encounter (Signed)
Rovanda has Dr Rush Landmark reviewed the lab results for this pt?  I think you told me they did come by fax.

## 2019-05-24 NOTE — Telephone Encounter (Signed)
Pt inquired about lab results.  °

## 2019-05-26 ENCOUNTER — Other Ambulatory Visit: Payer: Self-pay

## 2019-05-26 DIAGNOSIS — D500 Iron deficiency anemia secondary to blood loss (chronic): Secondary | ICD-10-CM

## 2019-05-26 NOTE — Telephone Encounter (Signed)
I have reviewed the laboratories from 05/05/2019  WBC 8.7 Hemoglobin 12.1 Hematocrit 38.0 Platelet 417 MCV 82.4 Iron/TIBC 27/270 Iron saturation 10% Ferritin 24  These will be scanned in the chart  The results are suggestive of a stable hemoglobin but with a progressive iron deficiency. I would like the patient to move forward with getting restarted on IV iron infusions. If we can keep her from needing an enteroscopy/video capsule endoscopy at her age and with her medical comorbidities that would be most ideal. She has had some issues with being on higher dose oral iron as well. Let us plan on the following: 1) IV iron infusion once monthly x2 months 2) 3 weeks after second iron infusion we will plan to proceed with CBC/iron/TIBC/ferritin 3) follow-up in clinic 1 to 2 weeks after labs are performed 4) decision at that time to consider periodic IV iron transfusions every 2 months to try and maintain her iron versus repeat enteroscopy plus video capsule endoscopy  [name], please reach out to the patient's family and let them know that I am happy with what her hemoglobin is but we want to try and keep her iron stores up and see if that helps optimize her blood counts and help keep her from needing any further procedures. Thanks.  GM

## 2019-05-26 NOTE — Telephone Encounter (Signed)
Iron infusions on 06/03/19 at 9 am and 07/02/19 at 9 am at the Patient [facility] 3 rd floor.  Labs repeated the week of December 28 and follow up 1-2 weeks after labs.    The patient has been notified of this information and all questions answered. The pt has been advised of the information and verbalized understanding.

## 2019-05-28 DIAGNOSIS — M7062 Trochanteric bursitis, left hip: Secondary | ICD-10-CM | POA: Diagnosis not present

## 2019-05-28 DIAGNOSIS — M25552 Pain in left hip: Secondary | ICD-10-CM | POA: Diagnosis not present

## 2019-06-03 ENCOUNTER — Other Ambulatory Visit: Payer: Self-pay

## 2019-06-03 ENCOUNTER — Ambulatory Visit (HOSPITAL_COMMUNITY)
Admission: RE | Admit: 2019-06-03 | Discharge: 2019-06-03 | Disposition: A | Discharge status: Home / Self Care | Payer: Medicare Other | Source: Ambulatory Visit | Attending: Internal Medicine | Admitting: Internal Medicine

## 2019-06-03 DIAGNOSIS — D500 Iron deficiency anemia secondary to blood loss (chronic): Secondary | ICD-10-CM | POA: Insufficient documentation

## 2019-06-03 MED ORDER — SODIUM CHLORIDE 0.9 % IV SOLN
510.0000 mg | INTRAVENOUS | Status: DC
  Administered 2019-06-03: 510 mg via INTRAVENOUS
  Filled 2019-06-03: qty 17

## 2019-06-03 MED ORDER — SODIUM CHLORIDE 0.9 % IV SOLN
INTRAVENOUS | Status: DC | PRN
  Administered 2019-06-03: 09:00:00 250 mL via INTRAVENOUS

## 2019-06-03 NOTE — Discharge Instructions (Signed)

## 2019-06-03 NOTE — Progress Notes (Signed)
PATIENT CARE CENTER NOTE  Diagnosis: Iron deficiency anemia due to chronic blood loss (D50.0)  Provider: Justice Britain, MD   Procedure: IV Feraheme    Note: Patient received Feraheme via PIV. Tolerated well with no adverse reaction. Vital signs stable. Discharge instructions given. Patient alert, oriented and ambulatory at discharge.

## 2019-06-09 DIAGNOSIS — L129 Pemphigoid, unspecified: Secondary | ICD-10-CM | POA: Diagnosis not present

## 2019-06-09 DIAGNOSIS — D509 Iron deficiency anemia, unspecified: Secondary | ICD-10-CM | POA: Diagnosis not present

## 2019-06-09 DIAGNOSIS — Z79899 Other long term (current) drug therapy: Secondary | ICD-10-CM | POA: Diagnosis not present

## 2019-07-02 ENCOUNTER — Other Ambulatory Visit: Payer: Self-pay

## 2019-07-02 ENCOUNTER — Ambulatory Visit (HOSPITAL_COMMUNITY)
Admission: RE | Admit: 2019-07-02 | Discharge: 2019-07-02 | Disposition: A | Discharge status: Home / Self Care | Payer: Medicare Other | Source: Ambulatory Visit | Attending: Internal Medicine | Admitting: Internal Medicine

## 2019-07-02 DIAGNOSIS — D509 Iron deficiency anemia, unspecified: Secondary | ICD-10-CM | POA: Diagnosis not present

## 2019-07-02 DIAGNOSIS — D500 Iron deficiency anemia secondary to blood loss (chronic): Secondary | ICD-10-CM | POA: Diagnosis present

## 2019-07-02 DIAGNOSIS — Z8719 Personal history of other diseases of the digestive system: Secondary | ICD-10-CM | POA: Diagnosis not present

## 2019-07-02 MED ORDER — SODIUM CHLORIDE 0.9 % IV SOLN
510.0000 mg | Freq: Once | INTRAVENOUS | Status: AC
  Administered 2019-07-02: 510 mg via INTRAVENOUS
  Filled 2019-07-02: qty 17

## 2019-07-02 MED ORDER — SODIUM CHLORIDE 0.9 % IV SOLN
INTRAVENOUS | Status: DC | PRN
  Administered 2019-07-02: 250 mL via INTRAVENOUS

## 2019-07-02 NOTE — Discharge Instructions (Signed)

## 2019-07-02 NOTE — Progress Notes (Signed)
Patient received IV Feraheme as ordered by Mansouraty Gabriel MD. Observed for at least 30 minutes post infusion.Tolerated well, vitals stable, discharge instructions given, verbalized understanding. Patient alert, oriented and ambulatory at the time of discharge.  

## 2019-07-08 DIAGNOSIS — D509 Iron deficiency anemia, unspecified: Secondary | ICD-10-CM | POA: Diagnosis not present

## 2019-07-08 DIAGNOSIS — L129 Pemphigoid, unspecified: Secondary | ICD-10-CM | POA: Diagnosis not present

## 2019-07-08 DIAGNOSIS — Z79899 Other long term (current) drug therapy: Secondary | ICD-10-CM | POA: Diagnosis not present

## 2019-07-13 ENCOUNTER — Telehealth: Payer: Self-pay | Admitting: Gastroenterology

## 2019-07-13 NOTE — Telephone Encounter (Signed)
Patient called inquiring about her labs

## 2019-07-13 NOTE — Telephone Encounter (Signed)
Called and spoke with patient-patient reports she needs to have lab work completed-advised of lab hours and of no appt needed; patient also requested to be scheduled for a f/u OV with Dr. Laureen Abrahams on 07/22/2019 at 10:10 am; Patient verbalized understanding of information/instructions;  Patient was advised to call the office at [phone] if questions/concerns arise;

## 2019-07-19 ENCOUNTER — Other Ambulatory Visit (INDEPENDENT_AMBULATORY_CARE_PROVIDER_SITE_OTHER): Payer: Medicare Other

## 2019-07-19 DIAGNOSIS — D500 Iron deficiency anemia secondary to blood loss (chronic): Secondary | ICD-10-CM | POA: Diagnosis not present

## 2019-07-19 LAB — IBC PANEL
Iron: 31 ug/dL — ABNORMAL LOW (ref 42–145)
Saturation Ratios: 13.4 % — ABNORMAL LOW (ref 20.0–50.0)
Transferrin: 165 mg/dL — ABNORMAL LOW (ref 212.0–360.0)

## 2019-07-19 LAB — CBC WITH DIFFERENTIAL/PLATELET
Basophils Absolute: 0.1 10*3/uL (ref 0.0–0.1)
Basophils Relative: 0.7 % (ref 0.0–3.0)
Eosinophils Absolute: 0.1 10*3/uL (ref 0.0–0.7)
Eosinophils Relative: 1 % (ref 0.0–5.0)
HCT: 40 % (ref 36.0–46.0)
Hemoglobin: 13.4 g/dL (ref 12.0–15.0)
Lymphocytes Relative: 11.8 % — ABNORMAL LOW (ref 12.0–46.0)
Lymphs Abs: 1.3 10*3/uL (ref 0.7–4.0)
MCHC: 33.5 g/dL (ref 30.0–36.0)
MCV: 79.3 fl (ref 78.0–100.0)
Monocytes Absolute: 1.1 10*3/uL — ABNORMAL HIGH (ref 0.1–1.0)
Monocytes Relative: 9.7 % (ref 3.0–12.0)
Neutro Abs: 8.5 10*3/uL — ABNORMAL HIGH (ref 1.4–7.7)
Neutrophils Relative %: 76.8 % (ref 43.0–77.0)
Platelets: 395 10*3/uL (ref 150.0–400.0)
RBC: 5.04 Mil/uL (ref 3.87–5.11)
RDW: 18.3 % — ABNORMAL HIGH (ref 11.5–15.5)
WBC: 11.1 10*3/uL — ABNORMAL HIGH (ref 4.0–10.5)

## 2019-07-19 LAB — FERRITIN: Ferritin: 270.7 ng/mL (ref 10.0–291.0)

## 2019-07-22 ENCOUNTER — Ambulatory Visit: Payer: Medicare Other | Admitting: Gastroenterology

## 2019-07-22 ENCOUNTER — Encounter: Payer: Self-pay | Admitting: Gastroenterology

## 2019-07-22 VITALS — BP 150/66 | HR 88 | Temp 98.3°F | Ht 60.25 in | Wt 132.2 lb

## 2019-07-22 DIAGNOSIS — K5520 Angiodysplasia of colon without hemorrhage: Secondary | ICD-10-CM

## 2019-07-22 DIAGNOSIS — R1013 Epigastric pain: Secondary | ICD-10-CM

## 2019-07-22 DIAGNOSIS — K31819 Angiodysplasia of stomach and duodenum without bleeding: Secondary | ICD-10-CM

## 2019-07-22 DIAGNOSIS — D500 Iron deficiency anemia secondary to blood loss (chronic): Secondary | ICD-10-CM | POA: Diagnosis not present

## 2019-07-22 DIAGNOSIS — R634 Abnormal weight loss: Secondary | ICD-10-CM | POA: Diagnosis not present

## 2019-07-22 NOTE — Patient Instructions (Addendum)
If you are age 85 or older, your body mass index should be between 23-30. Your Body mass index is 25.61 kg/m. If this is out of the aforementioned range listed, please consider follow up with your Primary Care Provider.  We will obtain records from your Primary Care Physician.   You have been given lab orders to take with you, so that they can be drawn by PCP.   We will schedule follow-up appointment 2 weeks after labs depending on results.    Hold Iron for the next 2 weeks to see if this helps improvement of abdomen pain.   Thank you for choosing me and [facility].  Dr. Rush Landmark

## 2019-07-22 NOTE — Progress Notes (Signed)
[place] VISIT   Primary Care Provider Lajean Manes, MD [address] [place] [address] [phone]  Patient Profile: Amber Park is a 85 y.o. 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  The patient presents to the [facility] for an evaluation and management of problem(s) noted below:  Problem List 1. Iron deficiency anemia due to chronic blood loss   2. AVM (arteriovenous malformation) of small bowel, acquired   3. AVM (arteriovenous malformation) of stomach, acquired   4. AVM (arteriovenous malformation) of colon   5. Loss of weight   6. Epigastric pain     History of Present Illness Please see initial consultation note from hospitalization as well as progress notes for full details of HPI.  Interval History Today, the patient is accompanied by her daughter and returns for scheduled follow-up.  She most recently had blood work performed as noted in epic which showed an improvement in her hemoglobin up to 13.4 range.  Ferritin was noted to be in the normal range.  Iron deficiency still noted with an iron saturation of less than 15%.  Overall she feels well in regards to energy and not having significant fatigue.  Even with having gone to oral iron every other day she noted some improvement in her abdominal discomfort but is still having issues in the midepigastrium.  It is not clearly postprandial in origin.  The patient has not noted any significant blood in her stools (melena/hematochezia/maroon stools).  Patient denies any significant nausea or vomiting.  However she is noting a weight loss per her report although we do not have significant evidence or records of the amount of weight loss since her last visit was a  telehealth visit.  Patient has not had any cross-sectional imaging in quite a while.  She and her daughter continue to be concerned about the role of a colonoscopy but understand that it may be required.  The patient is scheduled to have her first COVID-19 vaccination next week.  GI Review of Systems Positive as above Negative for dysphagia, odynophagia, anorexia, decreased appetite, change in bowel habits  Review of Systems General: Denies fevers/chills Cardiovascular: Denies chest pain Pulmonary: Denies shortness of breath Gastroenterological: See HPI Genitourinary: Denies darkened urine or hematuria Hematological: Positive for history of easy bruising/bleeding Dermatological: Denies jaundice Psychological: Mood is stable but has some anxiety about the weight loss   Medications Current Outpatient Medications  Medication Sig Dispense Refill  . acetaminophen (TYLENOL) 325 MG tablet Take 2 tablets (650 mg total) by mouth every 6 (six) hours as needed for mild pain (or Fever >/= 101).    Marland Kitchen amLODipine (NORVASC) 10 MG tablet Take 10 mg by mouth daily.    . Cholecalciferol (VITAMIN D3) 50 MCG (2000 UT) capsule Take 2,000 Units by mouth daily.    Marland Kitchen FERREX 150 150 MG capsule Take 1 capsule (150 mg total) by mouth daily for 30 days. 30 capsule 0  . hydrochlorothiazide (HYDRODIURIL) 25 MG tablet Take 25 mg by mouth daily.    Marland Kitchen latanoprost (XALATAN) 0.005 % ophthalmic solution Place 1 drop into both eyes at bedtime.     . pantoprazole (PROTONIX) 40 MG tablet Take 1 tablet (40 mg total) by mouth daily. 90 tablet 3  . tiZANidine (ZANAFLEX) 4 MG tablet Take 1 tablet (4 mg total) by mouth  every 8 (eight) hours as needed for muscle spasms. 30 tablet 0   No current facility-administered medications for this visit.    Allergies Allergies  Allergen Reactions  . Meperidine Hcl     REACTION: N/V    Histories Past Medical History:  Diagnosis Date  . Anxiety   . Arthritis   . Cancer (HCC)     hx of skin cancer removed from nose   . GERD (gastroesophageal reflux disease)   . History of blood transfusion 1963  . History of left bundle branch block    old ekg 06-23-11 dr Felipa Eth on chart  . Pneumonia    hx of pneumonia x 4   . PONV (postoperative nausea and vomiting)   . Shortness of breath dyspnea    with exertion    Past Surgical History:  Procedure Laterality Date  . ABDOMINAL HYSTERECTOMY    . BACK SURGERY     x 2  . BREAST SURGERY     bilateral cyst removal   . ENTEROSCOPY N/A 09/16/2018   Procedure: ENTEROSCOPY;  Surgeon: Rush Landmark Telford Nab., MD;  Location: [facility];  Service: Gastroenterology;  Laterality: N/A;  . HOT HEMOSTASIS N/A 09/16/2018   Procedure: HOT HEMOSTASIS (ARGON PLASMA COAGULATION/BICAP);  Surgeon: Irving Copas., MD;  Location: [facility];  Service: Gastroenterology;  Laterality: N/A;  . TONSILLECTOMY    . TOTAL HIP ARTHROPLASTY Left 05/31/2014   Procedure: LEFT TOTAL HIP ARTHROPLASTY ANTERIOR APPROACH;  Surgeon: Mauri Pole, MD;  Location: [facility];  Service: Orthopedics;  Laterality: Left;   Social History   Socioeconomic History  . Marital status: Widowed    Spouse name: Not on file  . Number of children: 3  . Years of education: Not on file  . Highest education level: Not on file  Occupational History  . Occupation: Retired Designer, multimedia at [employer]  . Smoking status: Former Smoker    Packs/day: 0.30    Types: Cigarettes    Start date: 1955    Quit date: 2005    Years since quitting: 16.0  . Smokeless tobacco: Never Used  Substance and Sexual Activity  . Alcohol use: Not Currently    Comment: rare   . Drug use: No  . Sexual activity: Not on file  Other Topics Concern  . Not on file  Social History Narrative  . Not on file   Social Determinants of Health   Financial Resource Strain:   . Difficulty of Paying Living Expenses: Not on file  Food Insecurity:   . Worried About Ship broker in the Last Year: Not on file  . Ran Out of Food in the Last Year: Not on file  Transportation Needs:   . Lack of Transportation (Medical): Not on file  . Lack of Transportation (Non-Medical): Not on file  Physical Activity:   . Days of Exercise per Week: Not on file  . Minutes of Exercise per Session: Not on file  Stress:   . Feeling of Stress : Not on file  Social Connections:   . Frequency of Communication with Friends and Family: Not on file  . Frequency of Social Gatherings with Friends and Family: Not on file  . Attends Religious Services: Not on file  . Active Member of Clubs or Organizations: Not on file  . Attends Archivist Meetings: Not on file  . Marital Status: Not on file  Intimate Partner Violence:   . Fear of  Current or Ex-Partner: Not on file  . Emotionally Abused: Not on file  . Physically Abused: Not on file  . Sexually Abused: Not on file   Family History  Problem Relation Age of Onset  . Colon cancer Mother 53       Died age 64  . Heart attack Father 39       Died in his sleep of an MI  . Lung cancer Sister        LLL resection, otherwise no treatment; now recurred  . Bladder Cancer Sister        Likely primary  . Renal cancer Sister   . Breast cancer Sister   . Heart disease Brother   . Heart disease Brother   . Esophageal cancer Neg Hx   . Inflammatory bowel disease Neg Hx   . Liver disease Neg Hx   . Pancreatic cancer Neg Hx   . Stomach cancer Neg Hx    I have reviewed her medical, social, and family history in detail and updated the electronic medical record as necessary.    PHYSICAL EXAMINATION  BP (!) 150/66 (BP Location: Left Arm, Patient Position: Sitting, Cuff Size: Normal)   Pulse 88   Temp 98.3 F (36.8 C)   Ht 5' 0.25" (1.53 m) Comment: height measured without shoes  Wt 132 lb 4 oz (60 kg)   BMI 25.61 kg/m  GEN: NAD, appears stated age, doesn't appear chronically ill, accompanied by daughter PSYCH: Cooperative,  without pressured speech EYE: Conjunctivae pink, sclerae anicteric ENT: MMM CV: Nontachycardic without R/Gs  RESP: No wheezing present GI: NABS, soft, NT/ND, without rebound or guarding MSK/EXT: No significant lower extremity edema SKIN: No jaundice NEURO:  Alert & Oriented x 3, no focal deficits  REVIEW OF DATA  I reviewed the following data at the time of this encounter:  GI Procedures and Studies  February 2020 SBE - No gross lesions in proximal/middle esophagus. LA Grade B esophagitis in distal esophagus. - Large hiatal hernia. Erosions without bleeding - consistent with [place]'s lesions present.. - A single non-bleeding angioectasia in the stomach. Treated with argon plasma coagulation (APC). - No other gross lesions in the stomach. - Five non-bleeding angioectasias in the duodenum. Treated with argon plasma coagulation (APC). 2 clips (MR conditional) were placed on 1 angioectasia which had oozed initially during first APC but stopped after further APC for additional hemostasis. - Normal mucosa was found in the entire examined duodenum otherwise. - Normal mucosa was found in the proximal jejunum. Tattooed distal extent of SBE.  2010 colonoscopy 2 cecal angiectasias  Laboratory Studies  Reviewed in epic  Imaging Studies  No relevant imaging studies to review   ASSESSMENT  Amber Park is a 85 y.o. 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  The patient is seen today for evaluation and management of:  1. Iron deficiency anemia due to chronic blood loss   2. AVM (arteriovenous malformation) of small bowel, acquired   3. AVM (arteriovenous malformation) of stomach, acquired   4. AVM (arteriovenous malformation) of colon   5. Loss of weight   6. Epigastric pain    Overall, the  patient is clinically and hemodynamically stable.  However she continues to have evidence of iron deficiency which I suspect is a result of angioectasias in other areas of the small intestine or colon that has  not been visualized or worked on yet.  With that being said she has a very good hemoglobin at this time and has responded appropriately to IV iron infusions.  I am going to stop her oral iron, because it is not clear that she is getting enough oral iron supplementation from that and we wonder whether this is causing some of her abdominal upset and symptoms.  In 4 weeks after completion of holding her oral iron if she continues to have evidence of iron deficiency then we will plan to get her set up for recurrent iron infusions, get her set up for an enteroscopy/colonoscopy diagnostic, and potentially obtain a CT abdomen/pelvis if she continues to have abdominal discomforts even with the complete removal of oral iron.  The risks and benefits of endoscopic evaluation were discussed with the patient; these include but are not limited to the risk of perforation, infection, bleeding, missed lesions, lack of diagnosis, severe illness requiring hospitalization, as well as anesthesia and sedation related illnesses.  The patient and daughter are agreeable to proceed in this fashion.  To help the patient's family understand the goals moving forward, the visit was performed and recorded for the purposes.  All patient questions were answered, to the best of my ability, and the patient agrees to the aforementioned plan of action with follow-up as indicated.   PLAN  Repeat iron indices and CBC in 1 month Stop Oral Iron for next few weeks If patient continuing to have abdominal discomfort and bloating and gas then will anticipate a CT Abdomen/Pelvis If Iron indices still indicate deficiency then needs a Colonoscopy + SBE then will consider role of VCE Continue MiraLAX 1-2 times daily Continue adequate hydration We  will consider simethicone and Beano in future if issues persist We will consider SIBO/EPI evaluation as well if bloating persists   Orders Placed This Encounter  Procedures  . CBC  . IBC + Ferritin    New Prescriptions   No medications on file   Modified Medications   No medications on file    Planned Follow Up No follow-ups on file.   Justice Britain, MD [facility] # [phone]

## 2019-08-03 ENCOUNTER — Encounter: Payer: Self-pay | Admitting: Gastroenterology

## 2019-08-03 NOTE — Progress Notes (Unsigned)
Review of outside records to be scanned into the chart  May 03, 2019 [facility] PCP visit Assessment for hypertension and chronic renal insufficiency  July 08, 2019 labs Sodium 137 Potassium 3.9 BUN 20 Creatinine 1.26 Calcium 9.9 WBC 11.5 Hemoglobin 12.8 Hematocrit 38.8 Platelet 435 MCV 79.5  June 09, 2019 labs Sodium 135 Potassium 3.2 BUN 20 Creatinine 1.18 Calcium 9.5 WBC 14.0 Hemoglobin 12.2 Hematocrit 37.2 Platelet 446 MCV 78.4  Justice Britain, MD [facility] # [phone]

## 2019-08-10 DIAGNOSIS — R739 Hyperglycemia, unspecified: Secondary | ICD-10-CM | POA: Diagnosis not present

## 2019-08-10 DIAGNOSIS — Z79899 Other long term (current) drug therapy: Secondary | ICD-10-CM | POA: Diagnosis not present

## 2019-08-18 ENCOUNTER — Other Ambulatory Visit (INDEPENDENT_AMBULATORY_CARE_PROVIDER_SITE_OTHER): Payer: Medicare Other

## 2019-08-18 DIAGNOSIS — K5520 Angiodysplasia of colon without hemorrhage: Secondary | ICD-10-CM

## 2019-08-18 DIAGNOSIS — D500 Iron deficiency anemia secondary to blood loss (chronic): Secondary | ICD-10-CM

## 2019-08-18 DIAGNOSIS — R634 Abnormal weight loss: Secondary | ICD-10-CM | POA: Diagnosis not present

## 2019-08-18 LAB — CBC
HCT: 37.3 % (ref 36.0–46.0)
Hemoglobin: 12.4 g/dL (ref 12.0–15.0)
MCHC: 33.1 g/dL (ref 30.0–36.0)
MCV: 79.7 fl (ref 78.0–100.0)
Platelets: 408 10*3/uL — ABNORMAL HIGH (ref 150.0–400.0)
RBC: 4.68 Mil/uL (ref 3.87–5.11)
RDW: 17 % — ABNORMAL HIGH (ref 11.5–15.5)
WBC: 12.5 10*3/uL — ABNORMAL HIGH (ref 4.0–10.5)

## 2019-08-18 LAB — IBC + FERRITIN
Ferritin: 87.5 ng/mL (ref 10.0–291.0)
Iron: 23 ug/dL — ABNORMAL LOW (ref 42–145)
Saturation Ratios: 9.7 % — ABNORMAL LOW (ref 20.0–50.0)
Transferrin: 170 mg/dL — ABNORMAL LOW (ref 212.0–360.0)

## 2019-08-19 ENCOUNTER — Other Ambulatory Visit: Payer: Self-pay

## 2019-08-19 ENCOUNTER — Telehealth: Payer: Self-pay

## 2019-08-19 DIAGNOSIS — D500 Iron deficiency anemia secondary to blood loss (chronic): Secondary | ICD-10-CM

## 2019-08-19 NOTE — Telephone Encounter (Signed)
Dr Rush Landmark the pt last had an iron infusion 07/02/19.

## 2019-08-20 NOTE — Telephone Encounter (Signed)
Thank you for update. GM 

## 2019-09-07 DIAGNOSIS — I129 Hypertensive chronic kidney disease with stage 1 through stage 4 chronic kidney disease, or unspecified chronic kidney disease: Secondary | ICD-10-CM | POA: Diagnosis not present

## 2019-09-07 DIAGNOSIS — Z1389 Encounter for screening for other disorder: Secondary | ICD-10-CM | POA: Diagnosis not present

## 2019-09-07 DIAGNOSIS — J984 Other disorders of lung: Secondary | ICD-10-CM | POA: Diagnosis not present

## 2019-09-07 DIAGNOSIS — K219 Gastro-esophageal reflux disease without esophagitis: Secondary | ICD-10-CM | POA: Diagnosis not present

## 2019-09-07 DIAGNOSIS — Z0000 Encounter for general adult medical examination without abnormal findings: Secondary | ICD-10-CM | POA: Diagnosis not present

## 2019-09-14 ENCOUNTER — Other Ambulatory Visit (INDEPENDENT_AMBULATORY_CARE_PROVIDER_SITE_OTHER): Payer: Medicare Other

## 2019-09-14 DIAGNOSIS — D500 Iron deficiency anemia secondary to blood loss (chronic): Secondary | ICD-10-CM | POA: Diagnosis not present

## 2019-09-14 LAB — CBC WITH DIFFERENTIAL/PLATELET
Basophils Absolute: 0.2 10*3/uL — ABNORMAL HIGH (ref 0.0–0.1)
Basophils Relative: 1.5 % (ref 0.0–3.0)
Eosinophils Absolute: 0.2 10*3/uL (ref 0.0–0.7)
Eosinophils Relative: 1.4 % (ref 0.0–5.0)
HCT: 36.6 % (ref 36.0–46.0)
Hemoglobin: 11.9 g/dL — ABNORMAL LOW (ref 12.0–15.0)
Lymphocytes Relative: 11.7 % — ABNORMAL LOW (ref 12.0–46.0)
Lymphs Abs: 1.5 10*3/uL (ref 0.7–4.0)
MCHC: 32.4 g/dL (ref 30.0–36.0)
MCV: 79.2 fl (ref 78.0–100.0)
Monocytes Absolute: 1 10*3/uL (ref 0.1–1.0)
Monocytes Relative: 7.8 % (ref 3.0–12.0)
Neutro Abs: 9.7 10*3/uL — ABNORMAL HIGH (ref 1.4–7.7)
Neutrophils Relative %: 77.6 % — ABNORMAL HIGH (ref 43.0–77.0)
Platelets: 427 10*3/uL — ABNORMAL HIGH (ref 150.0–400.0)
RBC: 4.62 Mil/uL (ref 3.87–5.11)
RDW: 15.3 % (ref 11.5–15.5)
WBC: 12.5 10*3/uL — ABNORMAL HIGH (ref 4.0–10.5)

## 2019-09-14 LAB — IBC + FERRITIN
Ferritin: 41.7 ng/mL (ref 10.0–291.0)
Iron: 7 ug/dL — ABNORMAL LOW (ref 42–145)
Saturation Ratios: 2.7 % — ABNORMAL LOW (ref 20.0–50.0)
Transferrin: 183 mg/dL — ABNORMAL LOW (ref 212.0–360.0)

## 2019-09-17 ENCOUNTER — Other Ambulatory Visit: Payer: Self-pay

## 2019-09-17 ENCOUNTER — Telehealth: Payer: Self-pay | Admitting: Family Medicine

## 2019-09-17 DIAGNOSIS — D500 Iron deficiency anemia secondary to blood loss (chronic): Secondary | ICD-10-CM

## 2019-09-17 NOTE — Telephone Encounter (Signed)
Pt was called and reminded of there appointment 

## 2019-09-21 ENCOUNTER — Encounter: Payer: Self-pay | Admitting: Gastroenterology

## 2019-09-21 ENCOUNTER — Ambulatory Visit: Payer: Medicare Other | Admitting: Gastroenterology

## 2019-09-21 VITALS — BP 140/60 | HR 95 | Temp 98.9°F | Ht 61.0 in | Wt 130.0 lb

## 2019-09-21 DIAGNOSIS — K5520 Angiodysplasia of colon without hemorrhage: Secondary | ICD-10-CM

## 2019-09-21 DIAGNOSIS — D500 Iron deficiency anemia secondary to blood loss (chronic): Secondary | ICD-10-CM

## 2019-09-21 DIAGNOSIS — R1013 Epigastric pain: Secondary | ICD-10-CM | POA: Diagnosis not present

## 2019-09-21 NOTE — Patient Instructions (Addendum)
It has been recommended to you by your physician that you have a(n) Colon/Endo at [facility] completed. Per your request, we did not schedule the procedure(s) today. We will look for a date at [facility] to coordinate with IV iron infusions.   You will also need a Abdomen Ultrasound. We will schedule this at a later time.   You will need labs 2 weeks after IV iron infusion. (10/12/19)  Dr.Mansouaty recommends that you have IV iron infusion every 6-8 weeks.  Thank you for choosing me and [facility].  Dr. Rush Landmark

## 2019-09-22 ENCOUNTER — Encounter: Payer: Self-pay | Admitting: Gastroenterology

## 2019-09-22 DIAGNOSIS — R1013 Epigastric pain: Secondary | ICD-10-CM | POA: Insufficient documentation

## 2019-09-22 NOTE — Progress Notes (Signed)
[place] VISIT   Primary Care Provider Lajean Manes, MD [address] [place] [address] [phone]  Patient Profile: Amber Park is a 86 y.o. 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  The patient presents to the [facility] for an evaluation and management of problem(s) noted below:  Problem List 1. Epigastric pain   2. Iron deficiency anemia due to chronic blood loss   3. AVM (arteriovenous malformation) of small bowel, acquired   4. AVM (arteriovenous malformation) of colon     History of Present Illness Please see prior progress notes for full details of HPI.  Interval History Today, the patient is accompanied by her daughter and returns for scheduled follow-up.  Her most recent blood work showed a slight decrease in her hemoglobin as well as a recurrent iron deficiency.  Iron saturation is dropped below 10%.  Patient has not noted any significant changes in her stools (melena/hematochezia/maroon stools).  She has had a significant improvement in her abdominal discomfort after she stopped oral iron but 25% of the time she will continue to have abdominal discomfort and pain in the midepigastric region with occasional radiation throughout the abdomen.  He wonders what this is though a result of.  She is COVID-19 vaccinated.  Patient has had no other progressive nausea or vomiting or weight loss.  GI Review of Systems Positive as above Negative for odynophagia, dysphagia, change in bowel habits  Review of Systems General: Denies fevers/chills/weight loss Cardiovascular: Denies chest pain Pulmonary: Denies shortness of breath Gastroenterological: See HPI Genitourinary: Denies darkened urine or  hematuria Hematological: Positive for longstanding history of easy bruising/bleeding Dermatological: Denies jaundice Psychological: Some anxiety about the potential need for endoscopic evaluation   Medications Current Outpatient Medications  Medication Sig Dispense Refill  . acetaminophen (TYLENOL) 325 MG tablet Take 2 tablets (650 mg total) by mouth every 6 (six) hours as needed for mild pain (or Fever >/= 101).    Marland Kitchen amLODipine (NORVASC) 10 MG tablet Take 10 mg by mouth daily.    . Cholecalciferol (VITAMIN D3) 50 MCG (2000 UT) capsule Take 2,000 Units by mouth daily.    . hydrochlorothiazide (HYDRODIURIL) 25 MG tablet Take 25 mg by mouth daily.    Marland Kitchen latanoprost (XALATAN) 0.005 % ophthalmic solution Place 1 drop into both eyes at bedtime.     . pantoprazole (PROTONIX) 40 MG tablet Take 1 tablet (40 mg total) by mouth daily. 90 tablet 3  . tiZANidine (ZANAFLEX) 4 MG tablet Take 1 tablet (4 mg total) by mouth every 8 (eight) hours as needed for muscle spasms. 30 tablet 0   No current facility-administered medications for this visit.    Allergies Allergies  Allergen Reactions  . Meperidine Hcl     REACTION: N/V    Histories Past Medical History:  Diagnosis Date  . Anxiety   . Arthritis   . Cancer (HCC)    hx of skin cancer removed from nose   . GERD (gastroesophageal reflux disease)   . History of blood transfusion 1963  . History of left bundle branch block    old ekg 06-23-11 dr Felipa Eth on chart  . Pneumonia    hx of pneumonia x 4   . PONV (postoperative nausea and vomiting)   . Shortness of breath dyspnea    with exertion  Past Surgical History:  Procedure Laterality Date  . ABDOMINAL HYSTERECTOMY    . BACK SURGERY     x 2  . BREAST SURGERY     bilateral cyst removal   . ENTEROSCOPY N/A 09/16/2018   Procedure: ENTEROSCOPY;  Surgeon: Rush Landmark Telford Nab., MD;  Location: [facility];  Service: Gastroenterology;  Laterality: N/A;  . HOT HEMOSTASIS N/A 09/16/2018    Procedure: HOT HEMOSTASIS (ARGON PLASMA COAGULATION/BICAP);  Surgeon: Irving Copas., MD;  Location: [facility];  Service: Gastroenterology;  Laterality: N/A;  . TONSILLECTOMY    . TOTAL HIP ARTHROPLASTY Left 05/31/2014   Procedure: LEFT TOTAL HIP ARTHROPLASTY ANTERIOR APPROACH;  Surgeon: Mauri Pole, MD;  Location: [facility];  Service: Orthopedics;  Laterality: Left;   Social History   Socioeconomic History  . Marital status: Widowed    Spouse name: Not on file  . Number of children: 3  . Years of education: Not on file  . Highest education level: Not on file  Occupational History  . Occupation: Retired Designer, multimedia at [employer]  . Smoking status: Former Smoker    Packs/day: 0.30    Types: Cigarettes    Start date: 1955    Quit date: 2005    Years since quitting: 16.1  . Smokeless tobacco: Never Used  Substance and Sexual Activity  . Alcohol use: Not Currently    Comment: rare   . Drug use: No  . Sexual activity: Not on file  Other Topics Concern  . Not on file  Social History Narrative  . Not on file   Social Determinants of Health   Financial Resource Strain:   . Difficulty of Paying Living Expenses: Not on file  Food Insecurity:   . Worried About Charity fundraiser in the Last Year: Not on file  . Ran Out of Food in the Last Year: Not on file  Transportation Needs:   . Lack of Transportation (Medical): Not on file  . Lack of Transportation (Non-Medical): Not on file  Physical Activity:   . Days of Exercise per Week: Not on file  . Minutes of Exercise per Session: Not on file  Stress:   . Feeling of Stress : Not on file  Social Connections:   . Frequency of Communication with Friends and Family: Not on file  . Frequency of Social Gatherings with Friends and Family: Not on file  . Attends Religious Services: Not on file  . Active Member of Clubs or Organizations: Not on file  . Attends Archivist Meetings: Not on  file  . Marital Status: Not on file  Intimate Partner Violence:   . Fear of Current or Ex-Partner: Not on file  . Emotionally Abused: Not on file  . Physically Abused: Not on file  . Sexually Abused: Not on file   Family History  Problem Relation Age of Onset  . Colon cancer Mother 30       Died age 3  . Heart attack Father 35       Died in his sleep of an MI  . Lung cancer Sister        LLL resection, otherwise no treatment; now recurred  . Bladder Cancer Sister        Likely primary  . Renal cancer Sister   . Breast cancer Sister   . Heart disease Brother   . Heart disease Brother   . Esophageal cancer Neg Hx   . Inflammatory  bowel disease Neg Hx   . Liver disease Neg Hx   . Pancreatic cancer Neg Hx   . Stomach cancer Neg Hx    I have reviewed her medical, social, and family history in detail and updated the electronic medical record as necessary.    PHYSICAL EXAMINATION  BP 140/60   Pulse 95   Temp 98.9 F (37.2 C)   Ht 5\' 1"  (1.549 m)   Wt 130 lb (59 kg)   BMI 24.56 kg/m  GEN: NAD, appears stated age, doesn't appear chronically ill, accompanied by daughter PSYCH: Cooperative, without pressured speech EYE: Conjunctivae pink, sclerae anicteric ENT: MMM CV: Nontachycardic without R/Gs  RESP: No wheezing present GI: NABS, soft, NT/ND, without rebound or guarding MSK/EXT: No significant lower extremity edema SKIN: No jaundice NEURO:  Alert & Oriented x 3, no focal deficits  REVIEW OF DATA  I reviewed the following data at the time of this encounter:  GI Procedures and Studies  February 2020 SBE - No gross lesions in proximal/middle esophagus. LA Grade B esophagitis in distal esophagus. - Large hiatal hernia. Erosions without bleeding - consistent with Cameron's lesions present.. - A single non-bleeding angioectasia in the stomach. Treated with argon plasma coagulation (APC). - No other gross lesions in the stomach. - Five non-bleeding angioectasias in  the duodenum. Treated with argon plasma coagulation (APC). 2 clips (MR conditional) were placed on 1 angioectasia which had oozed initially during first APC but stopped after further APC for additional hemostasis. - Normal mucosa was found in the entire examined duodenum otherwise. - Normal mucosa was found in the proximal jejunum. Tattooed distal extent of SBE.  2010 colonoscopy 2 cecal angiectasias  Laboratory Studies  Reviewed in epic  Imaging Studies  No relevant imaging studies to review   ASSESSMENT  Ms. Comella is a 86 y.o. 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  The patient is seen today for evaluation and management of:  1. Epigastric pain   2. Iron deficiency anemia due to chronic blood loss   3. AVM (arteriovenous malformation) of small bowel, acquired   4. AVM (arteriovenous malformation) of colon    The patient is hemodynamically stable.  However, the patient manifested recurrent iron deficiency anemia which is going to require repeated iron infusion next week.  Due to her history of angioectasias of the GI tract including not having evaluated or worked in the colon for over 10 years it is at this point in time that endoscopic evaluation is now strongly recommended.  I did offer the patient and her daughter consideration of discontinuing IV iron infusions without undergoing endoscopic evaluation but as I know she has cecal AVMs at least based on procedure 11 years ago I think we may be able to hopefully decrease her iron infusions if possible or need for potential transfusion in the future if we are able to evaluate that area and potentially treated.  I will also plan to treat anything I find in the upper GI tract.  Obviously, at her age risk factors for endoscopic evaluation are  increased in regards to potential complications but I do believe she is healthy enough for [place] to consider this endoscopic work-up.  I would consider a video capsule endoscopy at some point but we must complete the colonoscopy where previously noted angioectasias have been seen.  The risks and benefits of  endoscopic evaluation were discussed with the patient; these include but are not limited to the risk of perforation, infection, bleeding, missed lesions, lack of diagnosis, severe illness requiring hospitalization, as well as anesthesia and sedation related illnesses.  The patient and daughter are agreeable to proceed.  These will need to be done in the hospital-based setting since APC will need to be available.  For now we will transition her to IV iron every 6 to 8 weeks to continue to bump her stores up.  We will proceed with an abdominal ultrasound and potential cross-sectional imaging with CT abdomen/pelvis for further evaluation of her persistent abdominal pain.  All patient questions were answered, to the best of my ability, and the patient agrees to the aforementioned plan of action with follow-up as indicated.   PLAN  Transition IV iron infusions every 6 to 8 weeks CBC/iron/TIBC/ferritin to be drawn 3 weeks after the next iron infusion (already scheduled for next week) Unable to tolerate oral iron so will not be able to restart Proceed with diagnostic SBE plus colonoscopy then will consider role of video capsule endoscopy Abdominal ultrasound with possible role of cross-sectional CT abdomen/pelvis in future to further evaluate chronic abdominal discomfort Continue MiraLAX 1-2 times daily Continue adequate hydration May also consider SIBO/EPI evaluation of the bloating not a significant at this time   Orders Placed This Encounter  Procedures  . US Abdomen Complete  . CBC  . IBC + Ferritin    New Prescriptions   No medications on file   Modified Medications   No medications on file     Planned Follow Up No follow-ups on file.   Total Time in Face-to-Face and in Coordination of Care for patient including independent/personal interpretation/review of prior testing, medical history, examination, medication adjustment, communicating results with the patient directly, and documentation with the EHR is 30 minutes.   Justice Britain, MD [facility] Office # [phone]

## 2019-09-23 ENCOUNTER — Telehealth: Payer: Self-pay | Admitting: Gastroenterology

## 2019-09-23 NOTE — Telephone Encounter (Signed)
Pt requested to speak to you regarding an appt to be scheduled at [facility].

## 2019-09-28 ENCOUNTER — Ambulatory Visit (HOSPITAL_COMMUNITY)
Admission: RE | Admit: 2019-09-28 | Discharge: 2019-09-28 | Disposition: A | Discharge status: Home / Self Care | Payer: Medicare Other | Source: Ambulatory Visit | Attending: Internal Medicine | Admitting: Internal Medicine

## 2019-09-28 ENCOUNTER — Other Ambulatory Visit: Payer: Self-pay

## 2019-09-28 DIAGNOSIS — D500 Iron deficiency anemia secondary to blood loss (chronic): Secondary | ICD-10-CM | POA: Insufficient documentation

## 2019-09-28 MED ORDER — SODIUM CHLORIDE 0.9 % IV SOLN
INTRAVENOUS | Status: DC | PRN
  Administered 2019-09-28: 250 mL via INTRAVENOUS

## 2019-09-28 MED ORDER — SODIUM CHLORIDE 0.9 % IV SOLN
510.0000 mg | Freq: Once | INTRAVENOUS | Status: AC
  Administered 2019-09-28: 510 mg via INTRAVENOUS
  Filled 2019-09-28: qty 510

## 2019-09-28 NOTE — Discharge Instructions (Signed)

## 2019-09-28 NOTE — Progress Notes (Signed)
Patient received IV Feraheme as ordered by Mansouraty Gabriel MD. Observed for at least 30 minutes post infusion.Tolerated well, vitals stable, discharge instructions given, verbalized understanding. Patient alert, oriented and ambulatory at the time of discharge.  

## 2019-10-01 DIAGNOSIS — H40053 Ocular hypertension, bilateral: Secondary | ICD-10-CM | POA: Diagnosis not present

## 2019-10-01 DIAGNOSIS — Z961 Presence of intraocular lens: Secondary | ICD-10-CM | POA: Diagnosis not present

## 2019-10-01 NOTE — Telephone Encounter (Signed)
Pt will need to be scheduled for Endo/Colon at hospital, but would like to wait until 1-2 weeks after her next IV iron infusion. Next infusion is not scheduled at this time. Once infusion is scheduled I can then move forward with scheduling. Pt also need an abd ultrasound prior to procedure if possible.

## 2019-10-12 ENCOUNTER — Telehealth: Payer: Self-pay | Admitting: Gastroenterology

## 2019-10-12 ENCOUNTER — Other Ambulatory Visit (INDEPENDENT_AMBULATORY_CARE_PROVIDER_SITE_OTHER): Payer: Medicare Other

## 2019-10-12 DIAGNOSIS — D500 Iron deficiency anemia secondary to blood loss (chronic): Secondary | ICD-10-CM

## 2019-10-12 DIAGNOSIS — R1013 Epigastric pain: Secondary | ICD-10-CM | POA: Diagnosis not present

## 2019-10-12 LAB — IBC + FERRITIN
Ferritin: 171 ng/mL (ref 10.0–291.0)
Iron: 29 ug/dL — ABNORMAL LOW (ref 42–145)
Saturation Ratios: 12.1 % — ABNORMAL LOW (ref 20.0–50.0)
Transferrin: 171 mg/dL — ABNORMAL LOW (ref 212.0–360.0)

## 2019-10-12 LAB — CBC
HCT: 36 % (ref 36.0–46.0)
Hemoglobin: 11.7 g/dL — ABNORMAL LOW (ref 12.0–15.0)
MCHC: 32.5 g/dL (ref 30.0–36.0)
MCV: 80.6 fl (ref 78.0–100.0)
Platelets: 386 10*3/uL (ref 150.0–400.0)
RBC: 4.47 Mil/uL (ref 3.87–5.11)
RDW: 16.3 % — ABNORMAL HIGH (ref 11.5–15.5)
WBC: 11.1 10*3/uL — ABNORMAL HIGH (ref 4.0–10.5)

## 2019-10-12 MED ORDER — PANTOPRAZOLE SODIUM 40 MG PO TBEC: 40.0000 mg | DELAYED_RELEASE_TABLET | Freq: Every day | ORAL | 3 refills | Status: DC

## 2019-10-12 NOTE — Telephone Encounter (Signed)
Refill for Pantoprazole has been sent to [facility].

## 2019-10-14 ENCOUNTER — Telehealth: Payer: Self-pay | Admitting: Gastroenterology

## 2019-10-14 NOTE — Telephone Encounter (Signed)
Patient is returning your call.  

## 2019-10-14 NOTE — Telephone Encounter (Signed)
Called and left message for pt

## 2019-10-19 ENCOUNTER — Other Ambulatory Visit: Payer: Self-pay

## 2019-10-19 DIAGNOSIS — D500 Iron deficiency anemia secondary to blood loss (chronic): Secondary | ICD-10-CM

## 2019-10-24 ENCOUNTER — Other Ambulatory Visit: Payer: Self-pay | Admitting: Gastroenterology

## 2019-11-05 NOTE — Telephone Encounter (Signed)
See lab result note from 10/19/19.

## 2019-11-15 ENCOUNTER — Other Ambulatory Visit (INDEPENDENT_AMBULATORY_CARE_PROVIDER_SITE_OTHER): Payer: Medicare Other

## 2019-11-15 DIAGNOSIS — D500 Iron deficiency anemia secondary to blood loss (chronic): Secondary | ICD-10-CM | POA: Diagnosis not present

## 2019-11-15 LAB — CBC WITH DIFFERENTIAL/PLATELET
Basophils Absolute: 0.1 10*3/uL (ref 0.0–0.1)
Basophils Relative: 1 % (ref 0.0–3.0)
Eosinophils Absolute: 0.2 10*3/uL (ref 0.0–0.7)
Eosinophils Relative: 1.2 % (ref 0.0–5.0)
HCT: 35.4 % — ABNORMAL LOW (ref 36.0–46.0)
Hemoglobin: 11.4 g/dL — ABNORMAL LOW (ref 12.0–15.0)
Lymphocytes Relative: 9.6 % — ABNORMAL LOW (ref 12.0–46.0)
Lymphs Abs: 1.3 10*3/uL (ref 0.7–4.0)
MCHC: 32.3 g/dL (ref 30.0–36.0)
MCV: 79.7 fl (ref 78.0–100.0)
Monocytes Absolute: 1.1 10*3/uL — ABNORMAL HIGH (ref 0.1–1.0)
Monocytes Relative: 8.3 % (ref 3.0–12.0)
Neutro Abs: 10.4 10*3/uL — ABNORMAL HIGH (ref 1.4–7.7)
Neutrophils Relative %: 79.9 % — ABNORMAL HIGH (ref 43.0–77.0)
Platelets: 429 10*3/uL — ABNORMAL HIGH (ref 150.0–400.0)
RBC: 4.44 Mil/uL (ref 3.87–5.11)
RDW: 15.7 % — ABNORMAL HIGH (ref 11.5–15.5)
WBC: 13.1 10*3/uL — ABNORMAL HIGH (ref 4.0–10.5)

## 2019-11-15 LAB — IBC + FERRITIN
Ferritin: 47.9 ng/mL (ref 10.0–291.0)
Iron: 15 ug/dL — ABNORMAL LOW (ref 42–145)
Saturation Ratios: 5.3 % — ABNORMAL LOW (ref 20.0–50.0)
Transferrin: 204 mg/dL — ABNORMAL LOW (ref 212.0–360.0)

## 2019-11-16 ENCOUNTER — Telehealth: Payer: Self-pay | Admitting: Gastroenterology

## 2019-11-16 ENCOUNTER — Other Ambulatory Visit: Payer: Self-pay

## 2019-11-16 DIAGNOSIS — D500 Iron deficiency anemia secondary to blood loss (chronic): Secondary | ICD-10-CM

## 2019-11-16 NOTE — Telephone Encounter (Signed)
The pt has been scheduled for next iron infusion on 12/02/19 at 9 am at the [facility].  Lab order is Epic to be completed after infusion.

## 2019-11-16 NOTE — Telephone Encounter (Signed)
Patient called for lab results also states she can see them through [facility]

## 2019-11-16 NOTE — Telephone Encounter (Signed)
Mansouraty, Telford Nab., MD  Timothy Lasso, RN  Maanvi Lecompte, Please let the patient and patient's family know that her iron saturation has decreased once again. Thankfully, her hemoglobin remains mostly stable. Can we make sure that she is scheduled for IV iron infusions every 6 weeks? I believe she has an upcoming clinic visit with me to further discuss the previously noted enteroscopy/colonoscopy. I am fine with keeping that appointment. After each IV iron infusion we should obtain a CBC and iron indices. Please let me know if any other questions. Thanks. GM

## 2019-11-19 ENCOUNTER — Ambulatory Visit: Payer: Medicare Other | Admitting: Gastroenterology

## 2019-11-19 ENCOUNTER — Encounter: Payer: Self-pay | Admitting: Gastroenterology

## 2019-11-19 VITALS — BP 138/50 | HR 88 | Temp 98.5°F | Ht 61.0 in | Wt 130.0 lb

## 2019-11-19 DIAGNOSIS — K5520 Angiodysplasia of colon without hemorrhage: Secondary | ICD-10-CM | POA: Diagnosis not present

## 2019-11-19 DIAGNOSIS — K31819 Angiodysplasia of stomach and duodenum without bleeding: Secondary | ICD-10-CM | POA: Diagnosis not present

## 2019-11-19 DIAGNOSIS — D500 Iron deficiency anemia secondary to blood loss (chronic): Secondary | ICD-10-CM

## 2019-11-19 MED ORDER — SUTAB 1479-225-188 MG PO TABS: 1.0000 | ORAL_TABLET | ORAL | 0 refills | Status: DC

## 2019-11-19 NOTE — Patient Instructions (Addendum)
You have been scheduled for an enteroscopy and colonoscopy. Please follow the written instructions given to you at your visit today. Please pick up your prep supplies at the pharmacy within the next 1-3 days. If you use inhalers (even only as needed), please bring them with you on the day of your procedure.  Your next infusion is on 12/02/19 at 9:00am at [facility]. They will schedule your next iron infusion.

## 2019-11-20 ENCOUNTER — Encounter (HOSPITAL_COMMUNITY): Payer: Self-pay | Admitting: *Deleted

## 2019-11-20 ENCOUNTER — Emergency Department (HOSPITAL_COMMUNITY): Payer: Medicare Other

## 2019-11-20 ENCOUNTER — Emergency Department (HOSPITAL_COMMUNITY)
Admission: EM | Admit: 2019-11-20 | Discharge: 2019-11-20 | Disposition: A | Discharge status: Home / Self Care | Payer: Medicare Other | Attending: Emergency Medicine | Admitting: Emergency Medicine

## 2019-11-20 ENCOUNTER — Encounter: Payer: Self-pay | Admitting: Gastroenterology

## 2019-11-20 ENCOUNTER — Other Ambulatory Visit: Payer: Self-pay

## 2019-11-20 DIAGNOSIS — R069 Unspecified abnormalities of breathing: Secondary | ICD-10-CM | POA: Diagnosis not present

## 2019-11-20 DIAGNOSIS — R4182 Altered mental status, unspecified: Secondary | ICD-10-CM | POA: Diagnosis not present

## 2019-11-20 DIAGNOSIS — Z87891 Personal history of nicotine dependence: Secondary | ICD-10-CM | POA: Insufficient documentation

## 2019-11-20 DIAGNOSIS — R0902 Hypoxemia: Secondary | ICD-10-CM | POA: Diagnosis not present

## 2019-11-20 DIAGNOSIS — R42 Dizziness and giddiness: Secondary | ICD-10-CM | POA: Diagnosis not present

## 2019-11-20 DIAGNOSIS — K449 Diaphragmatic hernia without obstruction or gangrene: Secondary | ICD-10-CM | POA: Diagnosis not present

## 2019-11-20 DIAGNOSIS — R404 Transient alteration of awareness: Secondary | ICD-10-CM | POA: Diagnosis not present

## 2019-11-20 DIAGNOSIS — Z79899 Other long term (current) drug therapy: Secondary | ICD-10-CM | POA: Diagnosis not present

## 2019-11-20 DIAGNOSIS — I447 Left bundle-branch block, unspecified: Secondary | ICD-10-CM | POA: Diagnosis not present

## 2019-11-20 DIAGNOSIS — Z96642 Presence of left artificial hip joint: Secondary | ICD-10-CM | POA: Diagnosis not present

## 2019-11-20 DIAGNOSIS — I491 Atrial premature depolarization: Secondary | ICD-10-CM | POA: Diagnosis not present

## 2019-11-20 DIAGNOSIS — Z85828 Personal history of other malignant neoplasm of skin: Secondary | ICD-10-CM | POA: Insufficient documentation

## 2019-11-20 DIAGNOSIS — Z20822 Contact with and (suspected) exposure to covid-19: Secondary | ICD-10-CM | POA: Diagnosis not present

## 2019-11-20 DIAGNOSIS — R55 Syncope and collapse: Secondary | ICD-10-CM | POA: Diagnosis not present

## 2019-11-20 LAB — CBC WITH DIFFERENTIAL/PLATELET
Abs Immature Granulocytes: 0.08 10*3/uL — ABNORMAL HIGH (ref 0.00–0.07)
Basophils Absolute: 0 10*3/uL (ref 0.0–0.1)
Basophils Relative: 0 %
Eosinophils Absolute: 0 10*3/uL (ref 0.0–0.5)
Eosinophils Relative: 0 %
HCT: 33.7 % — ABNORMAL LOW (ref 36.0–46.0)
Hemoglobin: 10.8 g/dL — ABNORMAL LOW (ref 12.0–15.0)
Immature Granulocytes: 1 %
Lymphocytes Relative: 8 %
Lymphs Abs: 1.2 10*3/uL (ref 0.7–4.0)
MCH: 25.5 pg — ABNORMAL LOW (ref 26.0–34.0)
MCHC: 32 g/dL (ref 30.0–36.0)
MCV: 79.5 fL — ABNORMAL LOW (ref 80.0–100.0)
Monocytes Absolute: 0.7 10*3/uL (ref 0.1–1.0)
Monocytes Relative: 5 %
Neutro Abs: 12.5 10*3/uL — ABNORMAL HIGH (ref 1.7–7.7)
Neutrophils Relative %: 86 %
Platelets: 467 10*3/uL — ABNORMAL HIGH (ref 150–400)
RBC: 4.24 MIL/uL (ref 3.87–5.11)
RDW: 14.5 % (ref 11.5–15.5)
WBC: 14.6 10*3/uL — ABNORMAL HIGH (ref 4.0–10.5)
nRBC: 0 % (ref 0.0–0.2)

## 2019-11-20 LAB — URINALYSIS, ROUTINE W REFLEX MICROSCOPIC
Bilirubin Urine: NEGATIVE
Glucose, UA: NEGATIVE mg/dL
Hgb urine dipstick: NEGATIVE
Ketones, ur: 20 mg/dL — AB
Leukocytes,Ua: NEGATIVE
Nitrite: NEGATIVE
Protein, ur: NEGATIVE mg/dL
Specific Gravity, Urine: 1.01 (ref 1.005–1.030)
pH: 8 (ref 5.0–8.0)

## 2019-11-20 LAB — LACTIC ACID, PLASMA
Lactic Acid, Venous: 2.4 mmol/L (ref 0.5–1.9)
Lactic Acid, Venous: 1 mmol/L (ref 0.5–1.9)

## 2019-11-20 LAB — COMPREHENSIVE METABOLIC PANEL
ALT: 14 U/L (ref 0–44)
AST: 28 U/L (ref 15–41)
Albumin: 3.7 g/dL (ref 3.5–5.0)
Alkaline Phosphatase: 85 U/L (ref 38–126)
Anion gap: 16 — ABNORMAL HIGH (ref 5–15)
BUN: 18 mg/dL (ref 8–23)
CO2: 21 mmol/L — ABNORMAL LOW (ref 22–32)
Calcium: 9.3 mg/dL (ref 8.9–10.3)
Chloride: 97 mmol/L — ABNORMAL LOW (ref 98–111)
Creatinine, Ser: 0.98 mg/dL (ref 0.44–1.00)
GFR calc Af Amer: 58 mL/min — ABNORMAL LOW (ref >60)
GFR calc non Af Amer: 50 mL/min — ABNORMAL LOW (ref >60)
Glucose, Bld: 132 mg/dL — ABNORMAL HIGH (ref 70–99)
Potassium: 3.3 mmol/L — ABNORMAL LOW (ref 3.5–5.1)
Sodium: 134 mmol/L — ABNORMAL LOW (ref 135–145)
Total Bilirubin: 0.6 mg/dL (ref 0.3–1.2)
Total Protein: 7 g/dL (ref 6.5–8.1)

## 2019-11-20 LAB — RESPIRATORY PANEL BY RT PCR (FLU A&B, COVID)
Influenza A by PCR: NEGATIVE
Influenza B by PCR: NEGATIVE
SARS Coronavirus 2 by RT PCR: NEGATIVE

## 2019-11-20 LAB — TROPONIN I (HIGH SENSITIVITY)
Troponin I (High Sensitivity): 5 ng/L (ref <18)
Troponin I (High Sensitivity): 15 ng/L (ref <18)

## 2019-11-20 MED ORDER — POTASSIUM CHLORIDE 10 MEQ/100ML IV SOLN
10.0000 meq | Freq: Once | INTRAVENOUS | Status: AC
  Administered 2019-11-20: 10 meq via INTRAVENOUS
  Filled 2019-11-20: qty 100

## 2019-11-20 MED ORDER — SODIUM CHLORIDE 0.9 % IV BOLUS
500.0000 mL | Freq: Once | INTRAVENOUS | Status: AC
  Administered 2019-11-20: 500 mL via INTRAVENOUS

## 2019-11-20 MED ORDER — SODIUM CHLORIDE 0.9 % IV SOLN
1.0000 g | Freq: Once | INTRAVENOUS | Status: AC
  Administered 2019-11-20: 1 g via INTRAVENOUS
  Filled 2019-11-20: qty 10

## 2019-11-20 MED ORDER — ACETAMINOPHEN 325 MG PO TABS
650.0000 mg | ORAL_TABLET | Freq: Once | ORAL | Status: AC
  Administered 2019-11-20: 650 mg via ORAL
  Filled 2019-11-20: qty 2

## 2019-11-20 MED ORDER — LORAZEPAM 2 MG/ML IJ SOLN
0.5000 mg | Freq: Once | INTRAMUSCULAR | Status: AC
  Administered 2019-11-20: 0.5 mg via INTRAVENOUS
  Filled 2019-11-20: qty 1

## 2019-11-20 MED ORDER — SODIUM CHLORIDE 0.9 % IV BOLUS
1000.0000 mL | Freq: Once | INTRAVENOUS | Status: AC
  Administered 2019-11-20: 1000 mL via INTRAVENOUS

## 2019-11-20 NOTE — ED Provider Notes (Signed)
Assumed care of patient at 330.  Patient with history of anemia, anxiety who presents to the ED with mental status change.  Patient has been dealing with a cold over the last several weeks.  Vaccinated for Covid.  Slightly felt worse today and lightheaded.  Felt confused and family thought she is confused.  No concern for stroke on exam.  Lab work and head CT and chest x-ray thus far overall unremarkable.  Patient has gotten IV fluids and on reevaluation per Dr. Lorretta Harp patient is much improved.  She appears clinically dehydrated.  Lactic acid is elevated at 2.4.  Concern for possible UTI versus viral process.  Awaiting urinalysis, Covid test.  Supposedly she was near someone recently who tested positive for coronavirus.  Repeat lactic acid is 1.  Covid test is negative.  Patient asymptomatic upon my evaluation.  Neuro exam is normal.  No concern for stroke.  Does have a history of vertigo.  Overall suspect possibly mild dehydration versus vasovagal event versus vertigo.  Low concern for cardiac or neurological process otherwise.  Patient does have assistive devices at home to help with ambulation if needed including a walker.  Patient will stay with family tonight.  No concern for infectious process as urine sample was unremarkable.  Blood cultures and urine cultures have been collected.  At this time believe patient is stable for discharge and family is okay with this plan as well.  This chart was dictated using voice recognition software.  Despite best efforts to proofread,  errors can occur which can change the documentation meaning.     Lennice Sites, DO 11/21/19 [id number]

## 2019-11-20 NOTE — ED Notes (Signed)
Date and time results received: 11/20/19 3:18 PM  (use smartphrase ".now" to insert current time)  Test: Lactic  Critical Value: 2.4  Name of Provider Notified: Dr Gilford Raid  Orders Received? Or Actions Taken?: Actions Taken: Notified Provider

## 2019-11-20 NOTE — ED Triage Notes (Signed)
BIB EMS, about 1.5 hours of dizziness, no falls, felt faint while taking shower. Went to bed to wait on EMS, IV established. [id number]% RN CBG 120

## 2019-11-20 NOTE — Discharge Instructions (Addendum)
Keep well-hydrated as discussed.  Use your walker as needed but take your time when you change positions.

## 2019-11-20 NOTE — Progress Notes (Signed)
[place] VISIT   Primary Care Provider Lajean Manes, MD [address] [place] [address] [phone]  Patient Profile: Amber Park is a 86 y.o. 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  The patient presents to the [facility] for an evaluation and management of problem(s) noted below:  Problem List 1. Iron deficiency anemia due to chronic blood loss   2. AVM (arteriovenous malformation) of small bowel, acquired   3. AVM (arteriovenous malformation) of stomach, acquired   4. AVM (arteriovenous malformation) of colon     History of Present Illness Please see prior progress notes for full details of HPI.  Interval History Today, the patient returns with her daughter for follow-up.  Her most recent blood work has shown a stable hemoglobin but her iron deficiency has recurred.  She remains on IV iron infusions every 8 weeks with the planned follow-up one in May.  She believes that her stools may be slightly darker at this time and she is not taking any oral iron.  Her abdominal pain from what we believed was iron issues from oral iron are nearly completely resolved.  We have previously discussed the possibility of an upper and lower endoscopic evaluation but at her age she felt uncomfortable with moving forward with that at that time.  Patient denies any significant progressive nausea or vomiting or weight loss.  GI Review of Systems Positive as above Negative for dysphagia, odynophagia, changes in bowel habits  Review of Systems General: Denies fevers/chills/weight loss Cardiovascular: Denies chest pain Pulmonary: Denies shortness of breath Gastroenterological: See HPI Genitourinary: Denies darkened urine or  hematuria Hematological: Positive for longstanding history of easy bruising/bleeding that is unchanged Dermatological: Denies jaundice Psychological: Patient still has anxiety about need for possible endoscopic evaluation   Medications Current Outpatient Medications  Medication Sig Dispense Refill  . acetaminophen (TYLENOL) 325 MG tablet Take 2 tablets (650 mg total) by mouth every 6 (six) hours as needed for mild pain (or Fever >/= 101). (Patient taking differently: Take 325-650 mg by mouth every 6 (six) hours as needed for mild pain, moderate pain, fever or headache (or Fever >/= 101). )    . amLODipine (NORVASC) 10 MG tablet Take 10 mg by mouth daily.    . Cholecalciferol (VITAMIN D3) 50 MCG (2000 UT) capsule Take 2,000 Units by mouth daily.    . hydrochlorothiazide (HYDRODIURIL) 25 MG tablet Take 25 mg by mouth daily.    Marland Kitchen latanoprost (XALATAN) 0.005 % ophthalmic solution Place 1 drop into both eyes at bedtime.     . pantoprazole (PROTONIX) 40 MG tablet TAKE 1 TABLET BY MOUTH EVERY DAY (Patient taking differently: Take 40 mg by mouth daily. ) 90 tablet 3  . tiZANidine (ZANAFLEX) 4 MG tablet Take 1 tablet (4 mg total) by mouth every 8 (eight) hours as needed for muscle spasms. (Patient not taking: Reported on 11/20/2019) 30 tablet 0  . Sodium Sulfate-Mag Sulfate-KCl (SUTAB) [phone] MG TABS Take 1 kit by mouth as directed. 12 tablet 0   No current facility-administered medications for this visit.    Allergies Allergies  Allergen Reactions  . Anesthesia S-I-40 [Propofol] Nausea And Vomiting    Anesthetics -- N/V  . Codeine Nausea And Vomiting  . Meperidine Hcl     REACTION: N/V    Histories Past Medical History:  Diagnosis Date  . Anxiety   . Arthritis   . Cancer (HCC)    hx of skin cancer removed from nose   . GERD (gastroesophageal reflux disease)   . History of blood transfusion 1963  . History of left bundle branch block    old ekg 06-23-11 dr Felipa Eth on chart  .  Pneumonia    hx of pneumonia x 4   . PONV (postoperative nausea and vomiting)   . Shortness of breath dyspnea    with exertion    Past Surgical History:  Procedure Laterality Date  . ABDOMINAL HYSTERECTOMY    . BACK SURGERY     x 2  . BREAST SURGERY     bilateral cyst removal   . ENTEROSCOPY N/A 09/16/2018   Procedure: ENTEROSCOPY;  Surgeon: Rush Landmark Telford Nab., MD;  Location: [place];  Service: Gastroenterology;  Laterality: N/A;  . HOT HEMOSTASIS N/A 09/16/2018   Procedure: HOT HEMOSTASIS (ARGON PLASMA COAGULATION/BICAP);  Surgeon: Irving Copas., MD;  Location: [place];  Service: Gastroenterology;  Laterality: N/A;  . TONSILLECTOMY    . TOTAL HIP ARTHROPLASTY Left 05/31/2014   Procedure: LEFT TOTAL HIP ARTHROPLASTY ANTERIOR APPROACH;  Surgeon: Mauri Pole, MD;  Location: [facility];  Service: Orthopedics;  Laterality: Left;   Social History   Socioeconomic History  . Marital status: Widowed    Spouse name: Not on file  . Number of children: 3  . Years of education: Not on file  . Highest education level: Not on file  Occupational History  . Occupation: Retired Designer, multimedia at [employer]  . Smoking status: Former Smoker    Packs/day: 0.30    Types: Cigarettes    Start date: 1955    Quit date: 2005    Years since quitting: 16.3  . Smokeless tobacco: Never Used  Substance and Sexual Activity  . Alcohol use: Not Currently    Comment: rare   . Drug use: No  . Sexual activity: Not on file  Other Topics Concern  . Not on file  Social History Narrative  . Not on file   Social Determinants of Health   Financial Resource Strain:   . Difficulty of Paying Living Expenses:   Food Insecurity:   . Worried About Charity fundraiser in the Last Year:   . Arboriculturist in the Last Year:   Transportation Needs:   . Film/video editor (Medical):   Marland Kitchen Lack of Transportation (Non-Medical):   Physical Activity:   . Days of Exercise  per Week:   . Minutes of Exercise per Session:   Stress:   . Feeling of Stress :   Social Connections:   . Frequency of Communication with Friends and Family:   . Frequency of Social Gatherings with Friends and Family:   . Attends Religious Services:   . Active Member of Clubs or Organizations:   . Attends Archivist Meetings:   Marland Kitchen Marital Status:   Intimate Partner Violence:   . Fear of Current or Ex-Partner:   . Emotionally Abused:   Marland Kitchen Physically Abused:   . Sexually Abused:    Family History  Problem Relation Age of Onset  . Colon cancer Mother 5       Died age 31  . Heart attack Father 25       Died in his sleep of an MI  . Lung cancer Sister  LLL resection, otherwise no treatment; now recurred  . Bladder Cancer Sister        Likely primary  . Renal cancer Sister   . Breast cancer Sister   . Heart disease Brother   . Heart disease Brother   . Esophageal cancer Neg Hx   . Inflammatory bowel disease Neg Hx   . Liver disease Neg Hx   . Pancreatic cancer Neg Hx   . Stomach cancer Neg Hx    I have reviewed her medical, social, and family history in detail and updated the electronic medical record as necessary.    PHYSICAL EXAMINATION  BP (!) 138/50   Pulse 88   Temp 98.5 F (36.9 C)   Ht _0  (1.549 m)   Wt 130 lb (59 kg)   BMI 24.56 kg/m  GEN: NAD, appears stated age, doesn't appear chronically ill, accompanied by daughter PSYCH: Cooperative, without pressured speech EYE: Conjunctivae pink, sclerae anicteric ENT: MMM CV: Nontachycardic RESP: No wheezing apparent GI: NABS, soft, NT/ND, without rebound or guarding MSK/EXT: No significant lower extremity edema SKIN: No jaundice NEURO:  Alert & Oriented x 3, no focal deficits   REVIEW OF DATA  I reviewed the following data at the time of this encounter:  GI Procedures and Studies  Previously reviewed her SBE 2020 as well as her colonoscopy from 2010 with findings of AVMs on both (can see  procedure notes for full details)  Laboratory Studies  Reviewed in epic  Imaging Studies  No relevant imaging studies to review   ASSESSMENT  Amber Park is a 86 y.o. 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  The patient is seen today for evaluation and management of:  1. Iron deficiency anemia due to chronic blood loss   2. AVM (arteriovenous malformation) of small bowel, acquired   3. AVM (arteriovenous malformation) of stomach, acquired   4. AVM (arteriovenous malformation) of colon    The patient remains hemodynamically stable.  Clinically she has improved in regards to her abdominal discomfort now that she has been off oral iron.  We maintain her on IV iron infusions every 6 to 8 weeks at this point.  Her iron deficiency has recurred.  I once again have discussed the consideration of endoscopic evaluation for potential APC ablation of potential recurrent angioectasias of the upper GI tract as well as the previously noted cecal AVMs on last colonoscopy in 2010.  After an extensive discussion with the patient and daughter, we have agreed to move forward with an endoscopic evaluation.  The risks and benefits of endoscopic evaluation were discussed with the patient; these include but are not limited to the risk of perforation, infection, bleeding, missed lesions, lack of diagnosis, severe illness requiring hospitalization, as well as anesthesia and sedation related illnesses.  The patient is agreeable to proceed.  We will plan to proceed with an enteroscopy as well as a colonoscopy.  These will be done in the hospital-based setting for the ability to have APC for coagulation necessity.  Obvious that her age, the risks of endoscopic evaluation will be higher than someone who is younger but I think these can  be managed safely.  In the future we may consider discontinuing her on IV iron but, she has excellent health at this time for her age and I think she is a good candidate to proceed  with evaluation.  All patient questions were answered, to the best of my ability, and the patient agrees to the aforementioned plan of action with follow-up as indicated.   PLAN  Continue IV iron infusions every 6 to 8 weeks CBC/iron/TIBC/ferritin will be drawn 2 to 3 weeks after every infusion Unable to tolerate oral iron so we will not restart Proceed with scheduling diagnostic SBE and colonoscopy Hold on video capsule endoscopy for now Continue MiraLAX 1-2 times daily Continue adequate hydration Holding on SIBO/EPI evaluation since her abdominal pain and bloating have improved while she has been off of oral iron   Orders Placed This Encounter  Procedures  . Procedural/ Surgical Case Request: ENTEROSCOPY, COLONOSCOPY WITH PROPOFOL  . Ambulatory referral to Gastroenterology    New Prescriptions   SODIUM SULFATE-MAG SULFATE-KCL (SUTAB) [phone] MG TABS    Take 1 kit by mouth as directed.   Modified Medications   No medications on file    Planned Follow Up No follow-ups on file.   Total Time in Face-to-Face and in Coordination of Care for patient including independent/personal interpretation/review of prior testing, medical history, examination, medication adjustment, communicating results with the patient directly, and documentation with the EHR is 25 minutes.   Justice Britain, MD [facility] Office # [phone]

## 2019-11-20 NOTE — ED Notes (Signed)
Pt ambulated to restroom via walker with success

## 2019-11-20 NOTE — ED Notes (Signed)
Unable to collect temp oral at this time

## 2019-11-20 NOTE — ED Provider Notes (Signed)
[facility] DEPT Provider Note   CSN: [id number] Arrival date & time: 11/20/19  1244     History Chief Complaint  Patient presents with  . Dizziness    Amber Park is a 86 y.o. female.  Pt presents to the ED today with dizziness.  Pt was fine last night and earlier today.  Pt felt faint while taking a shower.  She was helped to the bed and EMS was called.  Pt right now is very confused.  She is not able to give any hx.  Per daughter, this is a rapid change.  Pt did see her GI doctor yesterday (Dr. Rush Landmark) for chronic anemia.  She is scheduled for a colonoscopy and an endoscopy.  Pt lives at [facility].  She has has been fully vaccinated against Covid.        Past Medical History:  Diagnosis Date  . Anxiety   . Arthritis   . Cancer (HCC)    hx of skin cancer removed from nose   . GERD (gastroesophageal reflux disease)   . History of blood transfusion 1963  . History of left bundle branch block    old ekg 06-23-11 dr Felipa Eth on chart  . Pneumonia    hx of pneumonia x 4   . PONV (postoperative nausea and vomiting)   . Shortness of breath dyspnea    with exertion     Patient Active Problem List   Diagnosis Date Noted  . Epigastric pain 09/22/2019  . Loss of weight 07/22/2019  . 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 Abdominal bloating 01/16/2019  . AVM (arteriovenous malformation) of colon 01/16/2019  . Lower abdominal pain 01/16/2019  . 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 Hiatal hernia 01/16/2019  . Symptomatic anemia 09/14/2018  . Overweight (BMI 25.0-29.9) 06/01/2014  . 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 ANEMIA, IRON DEFICIENCY 05/02/2009  . GERD 05/02/2009  . PERSONAL HX COLONIC POLYPS 05/02/2009    Past Surgical History:  Procedure Laterality Date  . ABDOMINAL HYSTERECTOMY    . BACK SURGERY     x 2  .  BREAST SURGERY     bilateral cyst removal   . ENTEROSCOPY N/A 09/16/2018   Procedure: ENTEROSCOPY;  Surgeon: Rush Landmark Telford Nab., MD;  Location: [facility];  Service: Gastroenterology;  Laterality: N/A;  . HOT HEMOSTASIS N/A 09/16/2018   Procedure: HOT HEMOSTASIS (ARGON PLASMA COAGULATION/BICAP);  Surgeon: Irving Copas., MD;  Location: [facility];  Service: Gastroenterology;  Laterality: N/A;  . TONSILLECTOMY    . TOTAL HIP ARTHROPLASTY Left 05/31/2014   Procedure: LEFT TOTAL HIP ARTHROPLASTY ANTERIOR APPROACH;  Surgeon: Mauri Pole, MD;  Location: [facility];  Service: Orthopedics;  Laterality: Left;     OB History   No obstetric history on file.     Family History  Problem Relation Age of Onset  . Colon cancer Mother 46       Died age 60  . Heart attack Father 61       Died in his sleep of an MI  . Lung cancer Sister        LLL resection, otherwise no treatment; now recurred  . Bladder Cancer Sister        Likely primary  . Renal cancer Sister   . Breast cancer Sister   . Heart disease Brother   . Heart disease Brother   . Esophageal  cancer Neg Hx   . Inflammatory bowel disease Neg Hx   . Liver disease Neg Hx   . Pancreatic cancer Neg Hx   . Stomach cancer Neg Hx     Social History   Tobacco Use  . Smoking status: Former Smoker    Packs/day: 0.30    Types: Cigarettes    Start date: 1955    Quit date: 2005    Years since quitting: 16.3  . Smokeless tobacco: Never Used  Substance Use Topics  . Alcohol use: Not Currently    Comment: rare   . Drug use: No    Home Medications Prior to Admission medications   Medication Sig Start Date End Date Taking? Authorizing Provider  acetaminophen (TYLENOL) 325 MG tablet Take 2 tablets (650 mg total) by mouth every 6 (six) hours as needed for mild pain (or Fever >/= 101). 06/02/14   Danae Orleans, PA-C  amLODipine (NORVASC) 10 MG tablet Take 10 mg by mouth daily. 08/28/18   [provider]    Cholecalciferol (VITAMIN D3) 50 MCG (2000 UT) capsule Take 2,000 Units by mouth daily.    [provider]  hydrochlorothiazide (HYDRODIURIL) 25 MG tablet Take 25 mg by mouth daily. 06/15/19   [provider]  latanoprost (XALATAN) 0.005 % ophthalmic solution Place 1 drop into both eyes at bedtime.  09/11/18   [provider]  pantoprazole (PROTONIX) 40 MG tablet TAKE 1 TABLET BY MOUTH EVERY DAY 10/25/19   Mansouraty, Telford Nab., MD  Sodium Sulfate-Mag Sulfate-KCl (SUTAB) [phone] MG TABS Take 1 kit by mouth as directed. 11/19/19   Mansouraty, Telford Nab., MD  tiZANidine (ZANAFLEX) 4 MG tablet Take 1 tablet (4 mg total) by mouth every 8 (eight) hours as needed for muscle spasms. 06/02/14   Danae Orleans, PA-C    Allergies    Meperidine hcl  Review of Systems   Review of Systems  Unable to perform ROS: Mental status change  All other systems reviewed and are negative.   Physical Exam Updated Vital Signs BP (!) 148/52   Pulse 83   Temp (!) 96.4 F (35.8 C) (Rectal)   Resp (!) 21   Ht 5' 1" (1.549 m)   Wt 59 kg   SpO2 96%   BMI 24.56 kg/m   Physical Exam Vitals and nursing note reviewed.  Constitutional:      Appearance: Normal appearance.  HENT:     Head: Normocephalic and atraumatic.     Right Ear: External ear normal.     Left Ear: External ear normal.     Nose: Nose normal.     Mouth/Throat:     Mouth: Mucous membranes are moist.     Pharynx: Oropharynx is clear.  Eyes:     Extraocular Movements: Extraocular movements intact.     Conjunctiva/sclera: Conjunctivae normal.     Pupils: Pupils are equal, round, and reactive to light.  Cardiovascular:     Rate and Rhythm: Normal rate and regular rhythm.     Pulses: Normal pulses.     Heart sounds: Normal heart sounds.  Pulmonary:     Effort: Pulmonary effort is normal.     Breath sounds: Normal breath sounds.  Abdominal:     General: Abdomen is flat. Bowel sounds are normal.      Palpations: Abdomen is soft.  Musculoskeletal:        General: Normal range of motion.     Cervical back: Normal range of motion and neck supple.  Skin:    General: Skin is warm.     Capillary Refill: Capillary refill takes less than 2 seconds.  Neurological:     Mental Status: She is alert. She is disoriented.  Psychiatric:        Mood and Affect: Mood is anxious.        Speech: Speech is delayed and tangential.        Behavior: Behavior normal.     ED Results / Procedures / Treatments   Labs (all labs ordered are listed, but only abnormal results are displayed) Labs Reviewed  COMPREHENSIVE METABOLIC PANEL - Abnormal; Notable for the following components:      Result Value   Sodium 134 (*)    Potassium 3.3 (*)    Chloride 97 (*)    CO2 21 (*)    Glucose, Bld 132 (*)    GFR calc non Af Amer 50 (*)    GFR calc Af Amer 58 (*)    Anion gap 16 (*)    All other components within normal limits  CBC WITH DIFFERENTIAL/PLATELET - Abnormal; Notable for the following components:   WBC 14.6 (*)    Hemoglobin 10.8 (*)    HCT 33.7 (*)    MCV 79.5 (*)    MCH 25.5 (*)    Platelets 467 (*)    Neutro Abs 12.5 (*)    Abs Immature Granulocytes 0.08 (*)    All other components within normal limits  LACTIC ACID, PLASMA - Abnormal; Notable for the following components:   Lactic Acid, Venous 2.4 (*)    All other components within normal limits  URINE CULTURE  CULTURE, BLOOD (ROUTINE X 2)  CULTURE, BLOOD (ROUTINE X 2)  RESPIRATORY PANEL BY RT PCR (FLU A&B, COVID)  URINALYSIS, ROUTINE W REFLEX MICROSCOPIC  LACTIC ACID, PLASMA  TROPONIN I (HIGH SENSITIVITY)  TROPONIN I (HIGH SENSITIVITY)    EKG EKG Interpretation  Date/Time:  Saturday Nov 20 2019 13:05:24 EDT Ventricular Rate:  91 PR Interval:    QRS Duration: 142 QT Interval:  430 QTC Calculation: 530 R Axis:   -22 Text Interpretation: Sinus rhythm Right atrial enlargement Left bundle branch block No significant change since  last tracing Confirmed by Isla Pence [phone]) on 11/20/2019 1:26:34 PM   Radiology CT Head Wo Contrast  Result Date: 11/20/2019 CLINICAL DATA:  Dizziness, near syncope, former smoker EXAM: CT HEAD WITHOUT CONTRAST TECHNIQUE: Contiguous axial images were obtained from the base of the skull through the vertex without intravenous contrast. Sagittal and coronal MPR images reconstructed from axial data set. COMPARISON:  None FINDINGS: Brain: Generalized atrophy. Normal ventricular morphology. No midline shift or mass effect. Mild small vessel chronic ischemic changes of deep cerebral white matter. Otherwise normal appearance of brain parenchyma. No intracranial hemorrhage, mass lesion or evidence of acute infarction. No extra-axial fluid collections. Vascular: Mild atherosclerotic calcification of internal carotid arteries at skull base. No hyperdense vessels. Skull: Intact Sinuses/Orbits: Clear Other: N/A IMPRESSION: Atrophy with minimal small vessel chronic ischemic changes of deep cerebral white matter. No acute intracranial abnormalities. Electronically Signed   By: Lavonia Dana M.D.   On: 11/20/2019 15:03   DG Chest Portable 1 View  Result Date: 11/20/2019 CLINICAL DATA:  Mental status changes, dizziness EXAM: PORTABLE CHEST 1 VIEW COMPARISON:  Portable exam at 1451 hours compared to 09/14/2018 FINDINGS: Normal heart size and pulmonary vascularity. Atherosclerotic calcification aorta. Large hiatal hernia. Lungs clear. No infiltrate, pleural effusion or pneumothorax. Bones demineralized. IMPRESSION: Large hiatal hernia.  No acute abnormalities. Electronically Signed   By: Lavonia Dana M.D.   On: 11/20/2019 15:27    Procedures Procedures (including critical care time)  Medications Ordered in ED Medications  sodium chloride 0.9 % bolus 1,000 mL (has no administration in time range)  cefTRIAXone (ROCEPHIN) 1 g in sodium chloride 0.9 % 100 mL IVPB (has no administration in time range)  potassium chloride  10 mEq in 100 mL IVPB (has no administration in time range)  sodium chloride 0.9 % bolus 500 mL (0 mLs Intravenous Stopped 11/20/19 1526)  LORazepam (ATIVAN) injection 0.5 mg (0.5 mg Intravenous Given 11/20/19 1329)    ED Course  I have reviewed the triage vital signs and the nursing notes.  Pertinent labs & imaging results that were available during my care of the patient were reviewed by me and considered in my medical decision making (see chart for details).    MDM Rules/Calculators/A&P                      Pt is now much more alert.  Urine is still pending.  Pt given IVFs and IV rocephin in case she has a UTI.  Pt signed out to Dr. Ronnald Nian.   Final Clinical Impression(s) / ED Diagnoses Final diagnoses:  Transient alteration of awareness    Rx / DC Orders ED Discharge Orders    None       Isla Pence, MD 11/20/19 [phone]

## 2019-11-22 LAB — URINE CULTURE: Culture: NO GROWTH

## 2019-11-25 LAB — CULTURE, BLOOD (ROUTINE X 2)
Culture: NO GROWTH
Culture: NO GROWTH
Special Requests: ADEQUATE
Special Requests: ADEQUATE

## 2019-12-02 ENCOUNTER — Ambulatory Visit (HOSPITAL_COMMUNITY)
Admission: RE | Admit: 2019-12-02 | Discharge: 2019-12-02 | Disposition: A | Discharge status: Home / Self Care | Payer: Medicare Other | Source: Ambulatory Visit | Attending: Internal Medicine | Admitting: Internal Medicine

## 2019-12-02 ENCOUNTER — Other Ambulatory Visit: Payer: Self-pay

## 2019-12-02 DIAGNOSIS — D500 Iron deficiency anemia secondary to blood loss (chronic): Secondary | ICD-10-CM | POA: Diagnosis not present

## 2019-12-02 MED ORDER — SODIUM CHLORIDE 0.9 % IV SOLN
510.0000 mg | INTRAVENOUS | Status: DC
  Administered 2019-12-02: 510 mg via INTRAVENOUS
  Filled 2019-12-02: qty 510

## 2019-12-02 MED ORDER — SODIUM CHLORIDE 0.9 % IV SOLN
INTRAVENOUS | Status: DC | PRN
  Administered 2019-12-02: 250 mL via INTRAVENOUS

## 2019-12-02 NOTE — Progress Notes (Signed)
0900 Pt arrived to the [facility] for a feraheme infusion. Pt A&Ox4, ambulatory on arrival. VSS, IV inserted and NS infusing at [facility]. Awaiting meds from pharmacy. WCTM.  0930 Feraheme infusion started, WCTM.   0950 Infusion completed, pt tolerated well, no s/s of reaction.   1020 Pt discharged from the [facility], VSS, IV removed without difficulty. RN reviewed discharge instructions with the pt, pt understands without assistance. Pt A&Ox4, ambulatory at time of discharge.

## 2019-12-02 NOTE — Discharge Instructions (Signed)
Ferumoxytol injection What is this medicine? FERUMOXYTOL is an iron complex. Iron is used to make healthy red blood cells, which carry oxygen and nutrients throughout the body. This medicine is used to treat iron deficiency anemia. This medicine may be used for other purposes; ask your health care provider or pharmacist if you have questions. COMMON BRAND NAME(S): Feraheme What should I tell my health care provider before I take this medicine? They need to know if you have any of these conditions:  anemia not caused by low iron levels  high levels of iron in the blood  magnetic resonance imaging (MRI) test scheduled  an unusual or allergic reaction to iron, other medicines, foods, dyes, or preservatives  pregnant or trying to get pregnant  breast-feeding How should I use this medicine? This medicine is for injection into a vein. It is given by a health care professional in a hospital or clinic setting. Talk to your pediatrician regarding the use of this medicine in children. Special care may be needed. Overdosage: If you think you have taken too much of this medicine contact a poison control center or emergency room at once. NOTE: This medicine is only for you. Do not share this medicine with others. What if I miss a dose? It is important not to miss your dose. Call your doctor or health care professional if you are unable to keep an appointment. What may interact with this medicine? This medicine may interact with the following medications:  other iron products This list may not describe all possible interactions. Give your health care provider a list of all the medicines, herbs, non-prescription drugs, or dietary supplements you use. Also tell them if you smoke, drink alcohol, or use illegal drugs. Some items may interact with your medicine. What should I watch for while using this medicine? Visit your doctor or healthcare professional regularly. Tell your doctor or healthcare  professional if your symptoms do not start to get better or if they get worse. You may need blood work done while you are taking this medicine. You may need to follow a special diet. Talk to your doctor. Foods that contain iron include: whole grains/cereals, dried fruits, beans, or peas, leafy green vegetables, and organ meats (liver, kidney). What side effects may I notice from receiving this medicine? Side effects that you should report to your doctor or health care professional as soon as possible:  allergic reactions like skin rash, itching or hives, swelling of the face, lips, or tongue  breathing problems  changes in blood pressure  feeling faint or lightheaded, falls  fever or chills  flushing, sweating, or hot feelings  swelling of the ankles or feet Side effects that usually do not require medical attention (report to your doctor or health care professional if they continue or are bothersome):  diarrhea  headache  nausea, vomiting  stomach pain This list may not describe all possible side effects. Call your doctor for medical advice about side effects. You may report side effects to FDA at 1-800-FDA-1088. Where should I keep my medicine? This drug is given in a hospital or clinic and will not be stored at home. NOTE: This sheet is a summary. It may not cover all possible information. If you have questions about this medicine, talk to your doctor, pharmacist, or health care provider.  2020 Elsevier/Gold Standard (2016-08-26 20:21:10) Anemia  Anemia is a condition in which you do not have enough red blood cells or hemoglobin. Hemoglobin is a substance in red  blood cells that carries oxygen. When you do not have enough red blood cells or hemoglobin (are anemic), your body cannot get enough oxygen and your organs may not work properly. As a result, you may feel very tired or have other problems. What are the causes? Common causes of anemia include:  Excessive bleeding.  Anemia can be caused by excessive bleeding inside or outside the body, including bleeding from the intestine or from periods in women.  Poor nutrition.  Long-lasting (chronic) kidney, thyroid, and liver disease.  Bone marrow disorders.  Cancer and treatments for cancer.  HIV (human immunodeficiency virus) and AIDS (acquired immunodeficiency syndrome).  Treatments for HIV and AIDS.  Spleen problems.  Blood disorders.  Infections, medicines, and autoimmune disorders that destroy red blood cells. What are the signs or symptoms? Symptoms of this condition include:  Minor weakness.  Dizziness.  Headache.  Feeling heartbeats that are irregular or faster than normal (palpitations).  Shortness of breath, especially with exercise.  Paleness.  Cold sensitivity.  Indigestion.  Nausea.  Difficulty sleeping.  Difficulty concentrating. Symptoms may occur suddenly or develop slowly. If your anemia is mild, you may not have symptoms. How is this diagnosed? This condition is diagnosed based on:  Blood tests.  Your medical history.  A physical exam.  Bone marrow biopsy. Your health care provider may also check your stool (feces) for blood and may do additional testing to look for the cause of your bleeding. You may also have other tests, including:  Imaging tests, such as a CT scan or MRI.  Endoscopy.  Colonoscopy. How is this treated? Treatment for this condition depends on the cause. If you continue to lose a lot of blood, you may need to be treated at a hospital. Treatment may include:  Taking supplements of iron, vitamin V03, or folic acid.  Taking a hormone medicine (erythropoietin) that can help to stimulate red blood cell growth.  Having a blood transfusion. This may be needed if you lose a lot of blood.  Making changes to your diet.  Having surgery to remove your spleen. Follow these instructions at home:  Take over-the-counter and prescription  medicines only as told by your health care provider.  Take supplements only as told by your health care provider.  Follow any diet instructions that you were given.  Keep all follow-up visits as told by your health care provider. This is important. Contact a health care provider if:  You develop new bleeding anywhere in the body. Get help right away if:  You are very weak.  You are short of breath.  You have pain in your abdomen or chest.  You are dizzy or feel faint.  You have trouble concentrating.  You have bloody or black, tarry stools.  You vomit repeatedly or you vomit up blood. Summary  Anemia is a condition in which you do not have enough red blood cells or enough of a substance in your red blood cells that carries oxygen (hemoglobin).  Symptoms may occur suddenly or develop slowly.  If your anemia is mild, you may not have symptoms.  This condition is diagnosed with blood tests as well as a medical history and physical exam. Other tests may be needed.  Treatment for this condition depends on the cause of the anemia. This information is not intended to replace advice given to you by your health care provider. Make sure you discuss any questions you have with your health care provider. Document Revised: 06/20/2017 Document Reviewed: 08/09/2016  Elsevier Patient Education  [employer].

## 2019-12-14 DIAGNOSIS — H903 Sensorineural hearing loss, bilateral: Secondary | ICD-10-CM | POA: Diagnosis not present

## 2019-12-24 ENCOUNTER — Telehealth: Payer: Self-pay | Admitting: Gastroenterology

## 2019-12-24 NOTE — Telephone Encounter (Signed)
The pt was given the information verbally and mailed to her home for review.  The pt has been advised of the information and verbalized understanding.      Amber Park   DOB: 08-28-1924 MRN: [id number] Procedure Date: 01/03/20 Arrival Time: 9:30am Procedure Time: 11:00am   Location of Procedure: [facility] BE RECEIVING UP TO 4 PHONE [place] TO EXPEDITE THE ADMITTING PROCESS- IF YOU MISS ONE, PLEASE CALL BACK ASAP  Due to recent COVID-19 restrictions implemented by our local and state authorities and in an effort to keep both patients and staff as safe as possible, our hospital system now requires COVID-19 testing prior to any scheduled hospital procedure. Please go to our [facility] location drive thru testing site ([address] [place], [place] [address]) on 12/30/19 at  9:40am. There will be multiple testing areas, the first checkpoint being for pre-procedure/surgery testing. Get into the right (yellow) lane that leads to the PAT testing team. You will not be billed at the time of testing but may receive a bill later depending on your insurance. The approximate cost of the test is $100. You must agree to quarantine from the time of your testing until the procedure date on 01/03/20 . This should include staying at home with ONLY the people you live with. Avoid take-out, grocery store shopping or leaving the house for any non-emergent reason. Failure to have your COVID-19 test done on the date and time you have been scheduled will result in cancellation of procedure. Please call our office at [phone] if you have any questions.     PREPARATION FOR COLONOSCOPY WITH SUTAB  On 12/29/19 (5 days prior to the procedure) stop eating nuts, seeds, popcorn, corn, beans, peas, salads, or any raw vegetables.  Do not take any fiber supplements (e.g. Metamucil, Citrucel, and Benefiber).  Please pick up your prep from the pharmacy a few days after your  previsit.  Please do not wait until the day before your procedure to pick it up.  THE DAY BEFORE PROCEDURE:   DATE: 01/02/20   DAY: Sunday  1. Drink clear liquids the entire day - NO SOLID FOOD  2. Do not drink anything colored red or purple. Avoid juices with pulp. No orange juice, grapefruit, or tomato juice.  3. Drink at least 64 oz. (8 glasses) of fluid/clear liquids during the day to prevent dehydration and help the prep work efficiently.   Clear liquids include: NO RED NO PURPLE  Water, ice        tea/coffee (sugar is ok, but no milk or cream)      juice (apple, white grape, white cranberry) clear bouillon          broth (beef/chicken/vegetable)         strained chicken noodle soup-no noodles    Jello                        Lemonade                                                         Kool-aid Popsicles               powdered fruit flavored drinks  Gatorade                                                      Soft drinks ( coke, pepsi, sprite, mt.dew, etc)                                 hard candy   FOLLOW THESE PREP INSTRUCTIONS, NOT INSTRUCTIONS ON SUTAB BOX.   THE DAY BEFORE PROCEDURE: At 6:00pm: COMPLETE STEPS 1 THROUGH 4 BELOW:  1. Open one bottle of 12 tablets. 2.   Fill the provided container with 16 ounces of water (up to the fill line). Swallow each tablet with a sip of water and drink the entire amount over 15 to 20 minutes. 3.   Approximately one hour after the last tablet is ingested, fill the provided container a second time with 16 ounces of water (up to the fill line) and drink the entire amount over 30 minutes. 4.   Approximately 30 minutes after finishing the second container of water, fill the provided container again with 16 ounces of water (up to the fill line) and drink the entire amount over 30 minutes.  If patients experience preparation-related symptoms (e.g. nausea, bloating, cramping), pause or slow the rate of drinking the  additional water until symptoms diminish.    Continue to drink clear liquids until bedtime.   DAY OF PROCEDURE:   DATE: 01/03/20       DAY: Monday  STAY ON CLEAR LIQUIDS, NO SOLID FOOD  Beginning at 6:00am  (5 hours prior to the procedure), COMPLETE STEPS 1 THROUGH 4 BELOW:  STAY ON CLEAR LIQUIDS, NO SOLID FOOD  1. Open the second bottle of 12 tablets. 2.   Fill the provided container with 16 ounces of water (up to the fill line). Swallow each tablet with a sip of water and drink the entire amount over 15 to 20 minutes. 3.   Approximately one hour after the last tablet is ingested, fill the provided container a second time with 16 ounces of water (up to the fill line) and drink the entire amount over 30 minutes. 4.   Approximately 30 minutes after finishing the second container of water, fill the provided container again with 16 ounces of water (up to the fill line) and drink the entire amount over 30 minutes.  If patients experience preparation-related symptoms (e.g. nausea, bloating, cramping), pause or slow the rate of drinking the additional water until symptoms diminish.   You may drink clear liquids until 7:00am (4 hours before procedure).   MEDICATION INSTRUCTIONS  You will be contacted by the hospital endoscopy staff prior to your procedure with instructions on what medications to take prior to your procedure.   Diabetic patients - You will be contacted by the hospital endoscopy staff prior to your procedure with instructions on what medications to take prior to your procedure.    _______________________________________________OTHER INSTRUCTIONS  You will need a responsible adult at least 84 years of age to accompany you and drive you home. This person must remain in the waiting room during your procedure.  Wear loose fitting clothing that is easily removed.  Leave jewelry and other valuables at home. However, you may wish to bring a book to read or an iPod/MP3 player to  listen to music as you wait for your procedure to start.  Remove all body piercing jewelry and leave at home.  Do not wear  red or dark nail polish.  Total time from sign-in until discharge is approximately 2-3 hours.  You should go home directly after your procedure and rest. You can resume normal activities the day after your procedure.  The day of your procedure you should not:  Drive  Make legal decisions  Operate machinery  Drink alcohol  Return to work  You will receive specific instructions about eating, activities and medications before you leave.

## 2019-12-30 ENCOUNTER — Other Ambulatory Visit (HOSPITAL_COMMUNITY)
Admission: RE | Admit: 2019-12-30 | Discharge: 2019-12-30 | Disposition: A | Discharge status: Home / Self Care | Payer: Medicare Other | Source: Ambulatory Visit | Attending: Gastroenterology | Admitting: Gastroenterology

## 2019-12-30 DIAGNOSIS — Z01812 Encounter for preprocedural laboratory examination: Secondary | ICD-10-CM | POA: Insufficient documentation

## 2019-12-30 DIAGNOSIS — Z20822 Contact with and (suspected) exposure to covid-19: Secondary | ICD-10-CM | POA: Diagnosis not present

## 2019-12-30 LAB — SARS CORONAVIRUS 2 (TAT 6-24 HRS): SARS Coronavirus 2: NEGATIVE

## 2019-12-31 ENCOUNTER — Other Ambulatory Visit: Payer: Self-pay

## 2019-12-31 ENCOUNTER — Encounter (HOSPITAL_COMMUNITY): Payer: Self-pay | Admitting: Gastroenterology

## 2019-12-31 NOTE — Progress Notes (Signed)
Pt denies SOB, chest pain, and being under the care of a cardiologist. Pt denies having an echo and cardiac cath but stated that a stress test was performed > 20 years ago. Pt made aware to stop taking  Aspirin (unless otherwise advised by surgeon), vitamins, fish oil and herbal medications. Do not take any NSAIDs ie: Ibuprofen, Advil, Naproxen (Aleve), Motrin, BC and Goody Powder. Pt reminded to quarantine. Pt verbalized understanding of all pre-op instructions. PA, Anesthesiology,  asked to review pt EKG.

## 2020-01-03 ENCOUNTER — Other Ambulatory Visit: Payer: Self-pay

## 2020-01-03 ENCOUNTER — Other Ambulatory Visit: Payer: Self-pay | Admitting: Physician Assistant

## 2020-01-03 ENCOUNTER — Encounter (HOSPITAL_COMMUNITY): Payer: Self-pay | Admitting: Gastroenterology

## 2020-01-03 ENCOUNTER — Ambulatory Visit (HOSPITAL_COMMUNITY): Payer: Medicare Other | Admitting: Physician Assistant

## 2020-01-03 ENCOUNTER — Ambulatory Visit (HOSPITAL_COMMUNITY)
Admission: RE | Admit: 2020-01-03 | Discharge: 2020-01-03 | Disposition: A | Discharge status: Home / Self Care | Payer: Medicare Other | Attending: Gastroenterology | Admitting: Gastroenterology

## 2020-01-03 ENCOUNTER — Encounter (HOSPITAL_COMMUNITY)
Admission: RE | Disposition: A | Discharge status: Home / Self Care | Payer: Self-pay | Source: Home / Self Care | Attending: Gastroenterology

## 2020-01-03 DIAGNOSIS — Z85828 Personal history of other malignant neoplasm of skin: Secondary | ICD-10-CM | POA: Diagnosis not present

## 2020-01-03 DIAGNOSIS — K279 Peptic ulcer, site unspecified, unspecified as acute or chronic, without hemorrhage or perforation: Secondary | ICD-10-CM

## 2020-01-03 DIAGNOSIS — K219 Gastro-esophageal reflux disease without esophagitis: Secondary | ICD-10-CM | POA: Diagnosis not present

## 2020-01-03 DIAGNOSIS — K5790 Diverticulosis of intestine, part unspecified, without perforation or abscess without bleeding: Secondary | ICD-10-CM | POA: Insufficient documentation

## 2020-01-03 DIAGNOSIS — Q438 Other specified congenital malformations of intestine: Secondary | ICD-10-CM | POA: Diagnosis not present

## 2020-01-03 DIAGNOSIS — C180 Malignant neoplasm of cecum: Secondary | ICD-10-CM | POA: Diagnosis not present

## 2020-01-03 DIAGNOSIS — M1990 Unspecified osteoarthritis, unspecified site: Secondary | ICD-10-CM | POA: Insufficient documentation

## 2020-01-03 DIAGNOSIS — Z96642 Presence of left artificial hip joint: Secondary | ICD-10-CM | POA: Insufficient documentation

## 2020-01-03 DIAGNOSIS — I10 Essential (primary) hypertension: Secondary | ICD-10-CM | POA: Insufficient documentation

## 2020-01-03 DIAGNOSIS — K3189 Other diseases of stomach and duodenum: Secondary | ICD-10-CM

## 2020-01-03 DIAGNOSIS — D490 Neoplasm of unspecified behavior of digestive system: Secondary | ICD-10-CM

## 2020-01-03 DIAGNOSIS — D509 Iron deficiency anemia, unspecified: Secondary | ICD-10-CM | POA: Diagnosis not present

## 2020-01-03 DIAGNOSIS — Z87891 Personal history of nicotine dependence: Secondary | ICD-10-CM | POA: Diagnosis not present

## 2020-01-03 DIAGNOSIS — K5520 Angiodysplasia of colon without hemorrhage: Secondary | ICD-10-CM

## 2020-01-03 DIAGNOSIS — K319 Disease of stomach and duodenum, unspecified: Secondary | ICD-10-CM | POA: Diagnosis not present

## 2020-01-03 DIAGNOSIS — Z885 Allergy status to narcotic agent status: Secondary | ICD-10-CM | POA: Diagnosis not present

## 2020-01-03 DIAGNOSIS — C182 Malignant neoplasm of ascending colon: Secondary | ICD-10-CM | POA: Insufficient documentation

## 2020-01-03 DIAGNOSIS — K449 Diaphragmatic hernia without obstruction or gangrene: Secondary | ICD-10-CM | POA: Diagnosis not present

## 2020-01-03 DIAGNOSIS — C188 Malignant neoplasm of overlapping sites of colon: Secondary | ICD-10-CM | POA: Diagnosis not present

## 2020-01-03 DIAGNOSIS — K641 Second degree hemorrhoids: Secondary | ICD-10-CM | POA: Insufficient documentation

## 2020-01-03 DIAGNOSIS — F419 Anxiety disorder, unspecified: Secondary | ICD-10-CM | POA: Diagnosis not present

## 2020-01-03 DIAGNOSIS — D123 Benign neoplasm of transverse colon: Secondary | ICD-10-CM

## 2020-01-03 DIAGNOSIS — Z884 Allergy status to anesthetic agent status: Secondary | ICD-10-CM | POA: Diagnosis not present

## 2020-01-03 HISTORY — PX: POLYPECTOMY: SHX5525

## 2020-01-03 HISTORY — PX: COLONOSCOPY WITH PROPOFOL: SHX5780

## 2020-01-03 HISTORY — DX: Family history of other specified conditions: Z84.89

## 2020-01-03 HISTORY — DX: Essential (primary) hypertension: I10

## 2020-01-03 HISTORY — DX: Unspecified glaucoma: H40.9

## 2020-01-03 HISTORY — DX: Personal history of other diseases of the digestive system: Z87.19

## 2020-01-03 HISTORY — DX: Presence of spectacles and contact lenses: Z97.3

## 2020-01-03 HISTORY — DX: Arteriovenous malformation, site unspecified: Q27.30

## 2020-01-03 HISTORY — DX: Unspecified hearing loss, unspecified ear: H91.90

## 2020-01-03 HISTORY — PX: BIOPSY: SHX5522

## 2020-01-03 HISTORY — DX: Presence of external hearing-aid: Z97.4

## 2020-01-03 HISTORY — DX: Anemia, unspecified: D64.9

## 2020-01-03 HISTORY — PX: SUBMUCOSAL TATTOO INJECTION: SHX6856

## 2020-01-03 HISTORY — PX: ENTEROSCOPY: SHX5533

## 2020-01-03 LAB — COMPREHENSIVE METABOLIC PANEL
ALT: 13 U/L (ref 0–44)
AST: 24 U/L (ref 15–41)
Albumin: 3.1 g/dL — ABNORMAL LOW (ref 3.5–5.0)
Alkaline Phosphatase: 72 U/L (ref 38–126)
Anion gap: 15 (ref 5–15)
BUN: 12 mg/dL (ref 8–23)
CO2: 22 mmol/L (ref 22–32)
Calcium: 8.9 mg/dL (ref 8.9–10.3)
Chloride: 99 mmol/L (ref 98–111)
Creatinine, Ser: 0.86 mg/dL (ref 0.44–1.00)
GFR calc Af Amer: >60 mL/min (ref >60)
GFR calc non Af Amer: 59 mL/min — ABNORMAL LOW (ref >60)
Glucose, Bld: 104 mg/dL — ABNORMAL HIGH (ref 70–99)
Potassium: 3.3 mmol/L — ABNORMAL LOW (ref 3.5–5.1)
Sodium: 136 mmol/L (ref 135–145)
Total Bilirubin: 0.5 mg/dL (ref 0.3–1.2)
Total Protein: 5.8 g/dL — ABNORMAL LOW (ref 6.5–8.1)

## 2020-01-03 LAB — CBC
HCT: 38.5 % (ref 36.0–46.0)
Hemoglobin: 12.3 g/dL (ref 12.0–15.0)
MCH: 26.1 pg (ref 26.0–34.0)
MCHC: 31.9 g/dL (ref 30.0–36.0)
MCV: 81.6 fL (ref 80.0–100.0)
Platelets: 402 10*3/uL — ABNORMAL HIGH (ref 150–400)
RBC: 4.72 MIL/uL (ref 3.87–5.11)
RDW: 15.1 % (ref 11.5–15.5)
WBC: 9 10*3/uL (ref 4.0–10.5)
nRBC: 0 % (ref 0.0–0.2)

## 2020-01-03 LAB — IRON AND TIBC
Iron: 20 ug/dL — ABNORMAL LOW (ref 28–170)
Saturation Ratios: 8 % — ABNORMAL LOW (ref 10.4–31.8)
TIBC: 249 ug/dL — ABNORMAL LOW (ref 250–450)
UIBC: 229 ug/dL

## 2020-01-03 LAB — FERRITIN: Ferritin: 56 ng/mL (ref 11–307)

## 2020-01-03 SURGERY — ENTEROSCOPY
Anesthesia: Monitor Anesthesia Care

## 2020-01-03 MED ORDER — SODIUM CHLORIDE 0.9 % IV SOLN: INTRAVENOUS | Status: DC

## 2020-01-03 MED ORDER — LACTATED RINGERS IV SOLN
INTRAVENOUS | Status: AC | PRN
  Administered 2020-01-03: 1000 mL via INTRAVENOUS

## 2020-01-03 MED ORDER — SPOT INK MARKER SYRINGE KIT
PACK | SUBMUCOSAL | Status: AC
  Filled 2020-01-03: qty 5

## 2020-01-03 MED ORDER — PROPOFOL 10 MG/ML IV BOLUS
INTRAVENOUS | Status: DC | PRN
  Administered 2020-01-03: 20 mg via INTRAVENOUS
  Administered 2020-01-03: 25 mg via INTRAVENOUS
  Administered 2020-01-03: 10 mg via INTRAVENOUS
  Administered 2020-01-03: 20 mg via INTRAVENOUS

## 2020-01-03 MED ORDER — PROPOFOL 500 MG/50ML IV EMUL
INTRAVENOUS | Status: DC | PRN
  Administered 2020-01-03: 100 ug/kg/min via INTRAVENOUS

## 2020-01-03 MED ORDER — PANTOPRAZOLE SODIUM 40 MG PO TBEC: 40.0000 mg | DELAYED_RELEASE_TABLET | Freq: Two times a day (BID) | ORAL | 3 refills | Status: DC

## 2020-01-03 MED ORDER — SPOT INK MARKER SYRINGE KIT
PACK | SUBMUCOSAL | Status: DC | PRN
  Administered 2020-01-03: 6 mL via SUBMUCOSAL

## 2020-01-03 SURGICAL SUPPLY — 22 items

## 2020-01-03 NOTE — Transfer of Care (Signed)
Immediate Anesthesia Transfer of Care Note  Patient: Amber Park  Procedure(s) Performed: ENTEROSCOPY (N/A ) COLONOSCOPY WITH PROPOFOL (N/A ) BIOPSY SUBMUCOSAL TATTOO INJECTION POLYPECTOMY  Patient Location: Endoscopy Unit  Anesthesia Type:General  Level of Consciousness: drowsy  Airway & Oxygen Therapy: Patient Spontanous Breathing and Patient connected to nasal cannula oxygen  Post-op Assessment: Report given to RN and Post -op Vital signs reviewed and stable  Post vital signs: Reviewed and stable  Last Vitals:  Vitals Value Taken Time  BP    Temp    Pulse 80 01/03/20 1239  Resp 19 01/03/20 1239  SpO2 100 % 01/03/20 1239  Vitals shown include unvalidated device data.  Last Pain:  Vitals:   01/03/20 1029  TempSrc: Temporal  PainSc: 0-No pain         Complications: No complications documented.

## 2020-01-03 NOTE — Discharge Instructions (Signed)
YOU HAD AN ENDOSCOPIC PROCEDURE TODAY: Refer to the procedure report and other information in the discharge instructions given to you for any specific questions about what was found during the examination. If this information does not answer your questions, please call [facility] office at [phone] to clarify.  ° °YOU SHOULD EXPECT: Some feelings of bloating in the abdomen. Passage of more gas than usual. Walking can help get rid of the air that was put into your GI tract during the procedure and reduce the bloating. If you had a lower endoscopy (such as a colonoscopy or flexible sigmoidoscopy) you may notice spotting of blood in your stool or on the toilet paper. Some abdominal soreness may be present for a day or two, also. ° °DIET: Your first meal following the procedure should be a light meal and then it is ok to progress to your normal diet. A half-sandwich or bowl of soup is an example of a good first meal. Heavy or fried foods are harder to digest and may make you feel nauseous or bloated. Drink plenty of fluids but you should avoid alcoholic beverages for 24 hours. If you had a esophageal dilation, please see attached instructions for diet.   ° °ACTIVITY: Your care partner should take you home directly after the procedure. You should plan to take it easy, moving slowly for the rest of the day. You can resume normal activity the day after the procedure however YOU SHOULD NOT DRIVE, use power tools, machinery or perform tasks that involve climbing or major physical exertion for 24 hours (because of the sedation medicines used during the test).  ° °SYMPTOMS TO REPORT IMMEDIATELY: °A gastroenterologist can be reached at any hour. Please call [phone]  for any of the following symptoms:  °Following lower endoscopy (colonoscopy, flexible sigmoidoscopy) °Excessive amounts of blood in the stool  °Significant tenderness, worsening of abdominal pains  °Swelling of the abdomen that is new, acute  °Fever of 100° or  higher  °Following upper endoscopy (EGD, EUS, ERCP, esophageal dilation) °Vomiting of blood or coffee ground material  °New, significant abdominal pain  °New, significant chest pain or pain under the shoulder blades  °Painful or persistently difficult swallowing  °New shortness of breath  °Black, tarry-looking or red, bloody stools ° °FOLLOW UP:  °If any biopsies were taken you will be contacted by phone or by letter within the next 1-3 weeks. Call [phone]  if you have not heard about the biopsies in 3 weeks.  °Please also call with any specific questions about appointments or follow up tests. ° °

## 2020-01-03 NOTE — Anesthesia Procedure Notes (Signed)
Procedure Name: MAC Date/Time: 01/03/2020 12:13 PM Performed by: Imagene Riches, CRNA Pre-anesthesia Checklist: Patient identified, Emergency Drugs available, Suction available, Patient being monitored and Timeout performed Oxygen Delivery Method: Simple face mask

## 2020-01-03 NOTE — H&P (Signed)
GASTROENTEROLOGY PROCEDURE H&P NOTE   Primary Care Physician: Lajean Manes, MD  HPI: Amber Park is a 86 y.o. female who presents for Enteroscopy/Colonoscopy for IDA evaluation and history of AVMs.  Past Medical History:  Diagnosis Date  . Anemia    iron deficiency  . Anxiety   . Arthritis   . AVM (arteriovenous malformation)    colon, stomach and bowel ( per progress note)  . Cancer (HCC)    hx of skin cancer removed from nose   . Family history of adverse reaction to anesthesia    Pt son has PONV  . GERD (gastroesophageal reflux disease)   . Glaucoma   . Hearing aid worn    B/L  . History of blood transfusion 1963  . History of hiatal hernia   . History of left bundle branch block    old ekg 06-23-11 dr Felipa Eth on chart  . HOH (hard of hearing)   . Hypertension   . Pneumonia    hx of pneumonia x 4   . PONV (postoperative nausea and vomiting)   . Shortness of breath dyspnea    with exertion   . Wears glasses    Past Surgical History:  Procedure Laterality Date  . ABDOMINAL HYSTERECTOMY    . BACK SURGERY     x 2  . BREAST SURGERY     bilateral cyst removal   . ENTEROSCOPY N/A 09/16/2018   Procedure: ENTEROSCOPY;  Surgeon: Rush Landmark Telford Nab., MD;  Location: [facility];  Service: Gastroenterology;  Laterality: N/A;  . HOT HEMOSTASIS N/A 09/16/2018   Procedure: HOT HEMOSTASIS (ARGON PLASMA COAGULATION/BICAP);  Surgeon: Irving Copas., MD;  Location: [facility];  Service: Gastroenterology;  Laterality: N/A;  . TONSILLECTOMY    . TOTAL HIP ARTHROPLASTY Left 05/31/2014   Procedure: LEFT TOTAL HIP ARTHROPLASTY ANTERIOR APPROACH;  Surgeon: Mauri Pole, MD;  Location: [facility];  Service: Orthopedics;  Laterality: Left;   Current Facility-Administered Medications  Medication Dose Route Frequency Provider Last Rate Last Admin  . 0.9 %  sodium chloride infusion   Intravenous Continuous Mansouraty, Telford Nab., MD      . lactated ringers infusion     Continuous PRN Mansouraty, Telford Nab., MD 20 mL/hr at 01/03/20 1041 1,000 mL at 01/03/20 1041   Allergies  Allergen Reactions  . Anesthesia S-I-40 [Propofol] Nausea And Vomiting    Anesthetics -- N/V  . Codeine Nausea And Vomiting  . Meperidine Hcl     REACTION: N/V   Family History  Problem Relation Age of Onset  . Colon cancer Mother 87       Died age 61  . Heart attack Father 10       Died in his sleep of an MI  . Lung cancer Sister        LLL resection, otherwise no treatment; now recurred  . Bladder Cancer Sister        Likely primary  . Renal cancer Sister   . Breast cancer Sister   . Heart disease Brother   . Heart disease Brother   . Esophageal cancer Neg Hx   . Inflammatory bowel disease Neg Hx   . Liver disease Neg Hx   . Pancreatic cancer Neg Hx   . Stomach cancer Neg Hx    Social History   Socioeconomic History  . Marital status: Widowed    Spouse name: Not on file  . Number of children: 3  . Years of education: Not on  file  . Highest education level: Not on file  Occupational History  . Occupation: Retired Designer, multimedia at [employer]  . Smoking status: Former Smoker    Packs/day: 0.30    Types: Cigarettes    Start date: 1955    Quit date: 2005    Years since quitting: 16.4  . Smokeless tobacco: Never Used  Vaping Use  . Vaping Use: Never used  Substance and Sexual Activity  . Alcohol use: Not Currently    Comment: rare   . Drug use: No  . Sexual activity: Not on file  Other Topics Concern  . Not on file  Social History Narrative  . Not on file   Social Determinants of Health   Financial Resource Strain:   . Difficulty of Paying Living Expenses:   Food Insecurity:   . Worried About Charity fundraiser in the Last Year:   . Arboriculturist in the Last Year:   Transportation Needs:   . Film/video editor (Medical):   Marland Kitchen Lack of Transportation (Non-Medical):   Physical Activity:   . Days of Exercise per Week:     . Minutes of Exercise per Session:   Stress:   . Feeling of Stress :   Social Connections:   . Frequency of Communication with Friends and Family:   . Frequency of Social Gatherings with Friends and Family:   . Attends Religious Services:   . Active Member of Clubs or Organizations:   . Attends Archivist Meetings:   Marland Kitchen Marital Status:   Intimate Partner Violence:   . Fear of Current or Ex-Partner:   . Emotionally Abused:   Marland Kitchen Physically Abused:   . Sexually Abused:     Physical Exam: Vital signs in last 24 hours: Temp:  [97.7 F (36.5 C)] 97.7 F (36.5 C) (06/14 1029) Pulse Rate:  [90] 90 (06/14 1029) Resp:  [21] 21 (06/14 1029) BP: (187)/(52) 187/52 (06/14 1029) SpO2:  [96 %] 96 % (06/14 1029) Weight:  [59 kg] 59 kg (06/14 1029)   GEN: NAD EYE: Sclerae anicteric ENT: MMM CV: Non-tachycardic GI: Soft, NT/ND NEURO:  Alert & Oriented x 3  Lab Results: No results for input(s): WBC, HGB, HCT, PLT in the last 72 hours. BMET No results for input(s): NA, K, CL, CO2, GLUCOSE, BUN, CREATININE, CALCIUM in the last 72 hours. LFT No results for input(s): PROT, ALBUMIN, AST, ALT, ALKPHOS, BILITOT, BILIDIR, IBILI in the last 72 hours. PT/INR No results for input(s): LABPROT, INR in the last 72 hours.   Impression / Plan: This is a 86 y.o.female who presents for Enteroscopy/Colonoscopy for IDA evaluation and history of AVMs.  The risks and benefits of endoscopic evaluation were discussed with the patient; these include but are not limited to the risk of perforation, infection, bleeding, missed lesions, lack of diagnosis, severe illness requiring hospitalization, as well as anesthesia and sedation related illnesses.  The patient is agreeable to proceed.    Justice Britain, MD [facility] Advanced [facility]oscopy Office # [phone]

## 2020-01-03 NOTE — Anesthesia Preprocedure Evaluation (Addendum)
Anesthesia Evaluation  Patient identified by MRN, date of birth, ID band Patient awake    Reviewed: Allergy & Precautions, NPO status , Patient's Chart, lab work & pertinent test results  History of Anesthesia Complications (+) PONV  Airway Mallampati: III  TM Distance: >3 FB Neck ROM: Full    Dental no notable dental hx. (+) Teeth Intact, Dental Advisory Given   Pulmonary neg pulmonary ROS, former smoker,    Pulmonary exam normal breath sounds clear to auscultation       Cardiovascular hypertension, Pt. on medications Normal cardiovascular exam Rhythm:Regular Rate:Normal  EKG LBBB   Neuro/Psych PSYCHIATRIC DISORDERS Anxiety negative neurological ROS     GI/Hepatic Neg liver ROS, hiatal hernia, PUD, GERD  Medicated,  Endo/Other  negative endocrine ROS  Renal/GU negative Renal ROS  negative genitourinary   Musculoskeletal  (+) Arthritis ,   Abdominal   Peds  Hematology  (+) Blood dyscrasia (Hgb 10.8), anemia ,   Anesthesia Other Findings Colonoscopy for anemia and AVM  Reproductive/Obstetrics                            Anesthesia Physical Anesthesia Plan  ASA: III  Anesthesia Plan: MAC   Post-op Pain Management:    Induction: Intravenous  PONV Risk Score and Plan: 3 and Propofol infusion and Treatment may vary due to age or medical condition  Airway Management Planned: Natural Airway  Additional Equipment:   Intra-op Plan:   Post-operative Plan:   Informed Consent: I have reviewed the patients History and Physical, chart, labs and discussed the procedure including the risks, benefits and alternatives for the proposed anesthesia with the patient or authorized representative who has indicated his/her understanding and acceptance.     Dental advisory given  Plan Discussed with: CRNA  Anesthesia Plan Comments:         Anesthesia Quick Evaluation

## 2020-01-03 NOTE — Op Note (Signed)
[facility] Patient Name: Amber Park Procedure Date : 01/03/2020 MRN: [id number] Attending MD: Justice Britain , MD Date of Birth: [birth year] CSN: [id number] Age: 86 Admit Type: Inpatient Procedure:                Small bowel enteroscopy Indications:              Iron deficiency anemia, Exclusion of angioectasia,                            Follow-up of peptic ulcer Providers:                Justice Britain, MD, Jeanella Cara, RN,                            Lazaro Arms, Technician Referring MD:             Adelfa Koh. Stoneking MD, MD Medicines:                Monitored Anesthesia Care Complications:            No immediate complications. Estimated Blood Loss:     Estimated blood loss was minimal. Procedure:                Pre-Anesthesia Assessment:                           - Prior to the procedure, a History and Physical                            was performed, and patient medications and                            allergies were reviewed. The patient's tolerance of                            previous anesthesia was also reviewed. The risks                            and benefits of the procedure and the sedation                            options and risks were discussed with the patient.                            All questions were answered, and informed consent                            was obtained. Prior Anticoagulants: The patient has                            taken no previous anticoagulant or antiplatelet                            agents. ASA Grade Assessment: III - A patient with  severe systemic disease. After reviewing the risks                            and benefits, the patient was deemed in                            satisfactory condition to undergo the procedure.                           After obtaining informed consent, the endoscope was                            passed under direct vision. Throughout the                             procedure, the patient's blood pressure, pulse, and                            oxygen saturations were monitored continuously. The                            PCF-H190DL ([id number]) Olympus pediatric colonoscope                            was introduced through the mouth and advanced to                            the proximal jejunum. The small bowel enteroscopy                            was accomplished without difficulty. The patient                            tolerated the procedure. Scope In: Scope Out: Findings:      No gross lesions were noted in the entire esophagus.      A large hiatal hernia was present. No evidence of persistent/recurrent       Cameron's lesions.      Multiple dispersed medium erosions with no bleeding and no stigmata of       recent bleeding were found at the incisura and in the gastric antrum.       Biopsies were taken with a cold forceps for histology.      No other gross mucosal lesions in the stomach.      Normal mucosa was found in the entire duodenum.      Normal mucosa was found in the visualized proximal jejunum. Area was       tattooed with an injection of Spot (carbon black) to demarcate the       distal extent of today's SBE. Impression:               - No gross lesions in esophagus.                           - Large hiatal hernia.                           -  Erosive gastropathy with no bleeding and no                            stigmata of recent bleeding. Biopsied.                           - No other gross mucosal lesions in the stomach.                           - Normal mucosa was found in the entire examined                            duodenum.                           - Normal mucosa was found in the visualized                            proximal jejunum. Tattooed distal extent. Recommendation:           - Proceed to scheduled colonoscopy.                           - Await pathology results.                            - Increase PPI to 40 mg BID for next 2-3 months and                            then decrease to 40 mg once daily vs 20 mg twice                            daily. Can decide as outpatient.                           - The findings and recommendations were discussed                            with the patient.                           - The findings and recommendations were discussed                            with the patient's family. Procedure Code(s):        --- Professional ---                           [phone], Small intestinal endoscopy, enteroscopy                            beyond second portion of duodenum, not including                            ileum; with biopsy, single or multiple  44799, Unlisted procedure, small intestine Diagnosis Code(s):        --- Professional ---                           K44.9, Diaphragmatic hernia without obstruction or                            gangrene                           K31.89, Other diseases of stomach and duodenum                           D50.9, Iron deficiency anemia, unspecified                           K27.9, Peptic ulcer, site unspecified, unspecified                            as acute or chronic, without hemorrhage or                            perforation CPT copyright 2019 American Medical Association. All rights reserved. The codes documented in this report are preliminary and upon coder review may  be revised to meet current compliance requirements. Justice Britain, MD 01/03/2020 12:53:37 PM Number of Addenda: 0

## 2020-01-03 NOTE — Op Note (Signed)
[facility] Patient Name: Amber Park Procedure Date : 01/03/2020 MRN: [id number] Attending MD: Justice Britain , MD Date of Birth: [birth year] CSN: [id number] Age: 86 Admit Type: Inpatient Procedure:                Colonoscopy Indications:              Iron deficiency anemia, Prior Colonoscopy >10 years                            ago Providers:                Justice Britain, MD, Jeanella Cara, RN,                            Lazaro Arms, Technician Referring MD:             Adelfa Koh. Stoneking MD, MD Medicines:                Monitored Anesthesia Care Complications:            No immediate complications. Estimated Blood Loss:     Estimated blood loss was minimal. Procedure:                Pre-Anesthesia Assessment:                           - Prior to the procedure, a History and Physical                            was performed, and patient medications and                            allergies were reviewed. The patient's tolerance of                            previous anesthesia was also reviewed. The risks                            and benefits of the procedure and the sedation                            options and risks were discussed with the patient.                            All questions were answered, and informed consent                            was obtained. Prior Anticoagulants: The patient has                            taken no previous anticoagulant or antiplatelet                            agents. ASA Grade Assessment: III - A patient with  severe systemic disease. After reviewing the risks                            and benefits, the patient was deemed in                            satisfactory condition to undergo the procedure.                           After obtaining informed consent, the colonoscope                            was passed under direct vision. Throughout the                             procedure, the patient's blood pressure, pulse, and                            oxygen saturations were monitored continuously. The                            PCF-H190DL ([id number]) Olympus pediatric colonoscope                            was introduced through the anus and advanced to the                            5 cm into the ileum. The colonoscopy was                            technically difficult and complex due to                            significant looping and a tortuous colon.                            Successful completion of the procedure was aided by                            changing the patient's position, using manual                            pressure, withdrawing and reinserting the scope,                            straightening and shortening the scope to obtain                            bowel loop reduction and using scope torsion. The                            patient tolerated the procedure. The quality of the  bowel preparation was good. The terminal ileum,                            ileocecal valve, appendiceal orifice, and rectum                            were photographed. Scope In: 12:01:09 PM Scope Out: 12:28:42 PM Scope Withdrawal Time: 0 hours 16 minutes 40 seconds  Total Procedure Duration: 0 hours 27 minutes 33 seconds  Findings:      The digital rectal exam findings include hemorrhoids. Pertinent       negatives include no palpable rectal lesions.      The terminal ileum and ileocecal valve appeared normal.      A frond-like/villous, fungating, infiltrative and ulcerated       non-obstructing large mass was found in the proximal ascending       colon/cecum. The mass was partially circumferential (involving       two-thirds of the lumen circumference). The mass measured at least five       cm in length. Oozing was present. Biopsies were taken with a cold       forceps for histology.      A 4 mm polyp was found in the  transverse colon. The polyp was sessile.       The polyp was removed with a cold snare. Resection and retrieval were       complete.      Multiple small-mouthed diverticula were found in the recto-sigmoid       colon, sigmoid colon and descending colon.      Normal mucosa was found in the entire colon otherwise.      Non-bleeding non-thrombosed external and internal hemorrhoids were found       during retroflexion, during perianal exam and during digital exam. The       hemorrhoids were Grade II (internal hemorrhoids that prolapse but reduce       spontaneously). Impression:               - Hemorrhoids found on digital rectal exam.                           - The examined portion of the ileum was normal.                           - Rule out malignancy, tumor in the proximal                            ascending colon/cecum. Biopsied.                           - One 4 mm polyp in the transverse colon, removed                            with a cold snare. Resected and retrieved.                           - Diverticulosis in the recto-sigmoid colon, in the  sigmoid colon and in the descending colon.                           - Normal mucosa in the entire examined colon                            otherwise.                           - Non-bleeding non-thrombosed external and internal                            hemorrhoids. Recommendation:           - The patient will be observed post-procedure,                            until all discharge criteria are met.                           - Discharge patient to home.                           - Patient has a contact number available for                            emergencies. The signs and symptoms of potential                            delayed complications were discussed with the                            patient. Return to normal activities tomorrow.                            Written discharge instructions were  provided to the                            patient.                           - High fiber diet.                           - Continue present medications.                           - Await pathology results.                           - Repeat colonoscopy in 1 year for surveillance.                           - Patient will need CT-CAP with IV/PO contrast for                            the next steps in evaluation of  this likely                            malignancy.                           - Once pathology has returned then will proceed                            with Oncology/Surgery evaluation/referral.                           - Will plan to obtain                            CBC/CMP/CEA/Iron/TIBC/Ferritin later today.                           - The findings and recommendations were discussed                            with the patient.                           - The findings and recommendations were discussed                            with the patient's family. Procedure Code(s):        --- Professional ---                           [phone], Colonoscopy, flexible; with removal of                            tumor(s), polyp(s), or other lesion(s) by snare                            technique                           [id number], 60, Colonoscopy, flexible; with biopsy,                            single or multiple Diagnosis Code(s):        --- Professional ---                           K64.1, Second degree hemorrhoids                           D49.0, Neoplasm of unspecified behavior of                            digestive system                           K63.5, Polyp of colon  D50.9, Iron deficiency anemia, unspecified                           K57.30, Diverticulosis of large intestine without                            perforation or abscess without bleeding CPT copyright 2019 American Medical Association. All rights reserved. The codes documented in this  report are preliminary and upon coder review may  be revised to meet current compliance requirements. Justice Britain, MD 01/03/2020 1:02:01 PM Number of Addenda: 0

## 2020-01-04 ENCOUNTER — Encounter: Payer: Self-pay | Admitting: Gastroenterology

## 2020-01-04 ENCOUNTER — Telehealth: Payer: Self-pay | Admitting: Gastroenterology

## 2020-01-04 LAB — CEA: CEA: 2.9 ng/mL (ref 0.0–4.7)

## 2020-01-04 LAB — SURGICAL PATHOLOGY

## 2020-01-04 NOTE — Telephone Encounter (Signed)
I received a call from pathology regarding this patient's colonoscopy. The final results will be released later today or tomorrow. Biopsies of the colon mass have returned consistent with adenocarcinoma. I had received permission from the patient to call the patient's family with the results when I had them. I called and spoke with the patient's daughter, Frederich Cha. I updated the daughter. Her CEA level also returned and was in the normal range.  We will plan to proceed in the following manner: 1) CT chest/abdomen/pelvis with IV and oral contrast to be ordered for colon cancer staging 2) oncology referral-preference Dr. Benay Spice (patient's family has multiple members that are taken care of by Dr. Benay Spice) 3) colorectal surgery referral-Dr. Thomas/Dr. White/Dr. Johney Maine  Final results and pathology will be sent in a letter once they have been released by pathology.  Justice Britain, MD [facility] Office # [phone]

## 2020-01-04 NOTE — Anesthesia Postprocedure Evaluation (Signed)
Anesthesia Post Note  Patient: Amber Park  Procedure(s) Performed: ENTEROSCOPY (N/A ) COLONOSCOPY WITH PROPOFOL (N/A ) BIOPSY SUBMUCOSAL TATTOO INJECTION POLYPECTOMY     Patient location during evaluation: PACU Anesthesia Type: MAC Level of consciousness: awake and alert Pain management: pain level controlled Vital Signs Assessment: post-procedure vital signs reviewed and stable Respiratory status: spontaneous breathing, nonlabored ventilation and respiratory function stable Cardiovascular status: stable and blood pressure returned to baseline Anesthetic complications: no   No complications documented.  Last Vitals:  Vitals:   01/03/20 1318 01/03/20 1348  BP: (!) 159/49 (!) 148/37  Pulse: 88 80  Resp: (!) 21 15  Temp:    SpO2: 99% 99%    Last Pain:  Vitals:   01/04/20 1129  TempSrc:   PainSc: 0-No pain                 Audry Pili

## 2020-01-05 ENCOUNTER — Encounter (HOSPITAL_COMMUNITY): Payer: Self-pay | Admitting: Gastroenterology

## 2020-01-05 ENCOUNTER — Telehealth: Payer: Self-pay | Admitting: Gastroenterology

## 2020-01-05 ENCOUNTER — Other Ambulatory Visit: Payer: Self-pay

## 2020-01-05 DIAGNOSIS — C189 Malignant neoplasm of colon, unspecified: Secondary | ICD-10-CM

## 2020-01-05 NOTE — Telephone Encounter (Signed)
You are scheduled on 01/11/20 at 2 pm. You should arrive 15 minutes prior to your appointment time for registration. Please pick up contrast from [facility] radiology at least 2 days prior to appt.   Referrals made to [facility] and [facility] oncology.

## 2020-01-05 NOTE — Telephone Encounter (Signed)
Patient is calling, states that she got a notifcation on my chart, but she can not get on her mychart to see it.   Incoming call

## 2020-01-05 NOTE — Telephone Encounter (Signed)
See alternate note  

## 2020-01-05 NOTE — Telephone Encounter (Signed)
The patient has been notified of all this information and all questions answered. She will pick up contrast this week

## 2020-01-06 NOTE — Progress Notes (Signed)
I spoke with patient's daughter Amber Park regarding her mother's new diagnosis of colon cancer.  She has CT this Tuesday 6/22.  Does not see Dr. Marcello Moores until 7/7.  She and her mother are anxious so I have scheduled them to see Dr. Benay Spice on Monday 6/28 at 2 pm.  She was very appreciative.  I explained my role as nurse navigator and she was given my direct number.

## 2020-01-11 ENCOUNTER — Other Ambulatory Visit: Payer: Self-pay

## 2020-01-11 ENCOUNTER — Telehealth: Payer: Self-pay | Admitting: Gastroenterology

## 2020-01-11 ENCOUNTER — Ambulatory Visit (HOSPITAL_COMMUNITY)
Admission: RE | Admit: 2020-01-11 | Discharge: 2020-01-11 | Disposition: A | Discharge status: Home / Self Care | Payer: Medicare Other | Source: Ambulatory Visit | Attending: Gastroenterology | Admitting: Gastroenterology

## 2020-01-11 DIAGNOSIS — C189 Malignant neoplasm of colon, unspecified: Secondary | ICD-10-CM | POA: Diagnosis not present

## 2020-01-11 DIAGNOSIS — C19 Malignant neoplasm of rectosigmoid junction: Secondary | ICD-10-CM | POA: Diagnosis not present

## 2020-01-11 DIAGNOSIS — R918 Other nonspecific abnormal finding of lung field: Secondary | ICD-10-CM | POA: Diagnosis not present

## 2020-01-11 MED ORDER — SODIUM CHLORIDE (PF) 0.9 % IJ SOLN
INTRAMUSCULAR | Status: AC
  Filled 2020-01-11: qty 50

## 2020-01-11 MED ORDER — IOHEXOL 300 MG/ML  SOLN
100.0000 mL | Freq: Once | INTRAMUSCULAR | Status: AC | PRN
  Administered 2020-01-11: 100 mL via INTRAVENOUS

## 2020-01-11 NOTE — Telephone Encounter (Signed)
Left message on machine to call back  

## 2020-01-11 NOTE — Telephone Encounter (Signed)
The pt will meet with oncology and decide on timing of IV iron and call back after appt Monday

## 2020-01-11 NOTE — Telephone Encounter (Signed)
[name], Thanks for reaching out. I did tell the patient and family that a repeat IV iron infusion would be reasonable. There is no urgency to it but we should try and get it scheduled.  Certainly she will be meeting oncology next week and if the IV iron infusion is 6 to 8 weeks from now because that is what we can schedule that is fine they can certainly move that up based on their evaluation of her next week if they feel needed.. Thanks. GM

## 2020-01-11 NOTE — Telephone Encounter (Signed)
Dr Rush Landmark the pt is under the impression that she is to have IV iron.  She states she was told that after her discharge from the hospital.  I see not mention of that; will this be taken care of my oncology?

## 2020-01-12 ENCOUNTER — Telehealth: Payer: Self-pay | Admitting: *Deleted

## 2020-01-12 NOTE — Telephone Encounter (Addendum)
Has received CT results from Dr. Rush Landmark and is calling oncology requesting a phone call "to give Amber Park some hope". Expressed they are so worried and would like to hear something from oncology prior to new patient appointment on 01/17/20.  Per Dr. Benay Spice: Does not appear to have stage IV disease. Has known cancer at the cecum. Lung nodules are very common and likely benign. Nodes adjacent to mass will come out with surgery. We will go over everything on 01/17/20. Left VM w/patient that nurse will try to reach her again tomorrow. Will not be able to call Juliann Pulse since she is not on a signed ROI in our office.

## 2020-01-13 ENCOUNTER — Telehealth: Payer: Self-pay | Admitting: *Deleted

## 2020-01-13 NOTE — Telephone Encounter (Signed)
Patient left message this morning requesting we call her daughter w/MD's assessment of her records. Called daughter and made her aware of Dr. Gearldine Shown review as noted on 01/12/20 telephone note. F/U as scheduled on 6/28 and will be reviewed in much detail. Daughter appreciated call--says "mom has pretty much put herself in the grave after this diagnosis".

## 2020-01-17 ENCOUNTER — Inpatient Hospital Stay: Payer: Medicare Other | Attending: Oncology | Admitting: Oncology

## 2020-01-17 ENCOUNTER — Other Ambulatory Visit: Payer: Self-pay

## 2020-01-17 VITALS — BP 144/56 | HR 92 | Temp 97.5°F | Resp 18 | Ht 61.0 in | Wt 127.9 lb

## 2020-01-17 DIAGNOSIS — R918 Other nonspecific abnormal finding of lung field: Secondary | ICD-10-CM | POA: Diagnosis not present

## 2020-01-17 DIAGNOSIS — I10 Essential (primary) hypertension: Secondary | ICD-10-CM | POA: Diagnosis not present

## 2020-01-17 DIAGNOSIS — Z79899 Other long term (current) drug therapy: Secondary | ICD-10-CM | POA: Diagnosis not present

## 2020-01-17 DIAGNOSIS — C180 Malignant neoplasm of cecum: Secondary | ICD-10-CM | POA: Diagnosis not present

## 2020-01-17 DIAGNOSIS — H409 Unspecified glaucoma: Secondary | ICD-10-CM | POA: Insufficient documentation

## 2020-01-17 DIAGNOSIS — D509 Iron deficiency anemia, unspecified: Secondary | ICD-10-CM | POA: Diagnosis not present

## 2020-01-17 DIAGNOSIS — Z87891 Personal history of nicotine dependence: Secondary | ICD-10-CM | POA: Insufficient documentation

## 2020-01-17 DIAGNOSIS — C182 Malignant neoplasm of ascending colon: Secondary | ICD-10-CM | POA: Diagnosis not present

## 2020-01-17 DIAGNOSIS — K449 Diaphragmatic hernia without obstruction or gangrene: Secondary | ICD-10-CM | POA: Insufficient documentation

## 2020-01-17 NOTE — Progress Notes (Signed)
[facility] Patient Consult   Requesting MD: Lajean Manes, [place] [address],  [place] [address]   DIAMOND JENTZ 86 y.o.  1924/12/13    Reason for Consult: Colon cancer   HPI: Ms. 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  She is admitted with severe iron deficiency in February 2020.  She underwent an upper endoscopy on 09/16/2018.  This revealed Cameron's erosions, a single nonbleeding angiectasia in the stomach treated with APC.  5 nonbleeding angiectasia's were seen in the duodenum also treated with APC.  She was treated with red cell transfusions on 09/14/2018.  She also received IV iron. She has persistent iron deficiency anemia and last received IV iron on 12/02/2019.  She cannot tolerate oral iron.  She underwent repeat upper endoscopy and a colonoscopy on 01/03/2020.  A small bowel enteroscopy revealed no evidence of Cameron's lesions.  Multiple erosions with no bleeding were found at the gastric antrum and incisura.  Normal mucosa in the duodenum.  Normal mucosa in the visualized proximal jejunum. A colonoscopy revealed a large mass in the cecum.  Oozing was present.  A biopsy was obtained.  A 4 mm polyp was removed from the transverse colon. The pathology revealed reactive gastropathy at the gastric antrum.  The cecal mass returned as adenocarcinoma.  The transverse colon polyp was a tubular adenoma without high-grade dysplasia.  Ms. Kuntzman has is scheduled to be seen by Dr. Marcello Moores on 01/26/2020.  She underwent staging CTs on 01/03/2020.  There is a large hiatal hernia.  Multiple tiny pulmonary nodules.  No liver lesion.  There is circumferential wall thickening of the cecum.  Prominent subcentimeter mesenteric nodes in the right lower quadrant mesocolon adjacent to the cecal mass.    Past Medical History:  Diagnosis Date  . Anemia    iron  deficiency  . Anxiety   . Arthritis   . AVM (arteriovenous malformation)    colon, stomach and bowel ( per progress note)  . Cancer (HCC)    hx of skin cancer removed from nose   . Family history of adverse reaction to anesthesia    Pt son has PONV  . GERD (gastroesophageal reflux disease)   . Glaucoma   . Hearing aid worn    B/L  . History of blood transfusion 1963, 2021  . History of hiatal hernia   . History of left bundle branch block    old ekg 06-23-11 dr Felipa Eth on chart  . HOH (hard of hearing)   . Hypertension   . Pneumonia    hx of pneumonia x 4   . PONV (postoperative nausea and vomiting)   . Shortness of breath dyspnea    with exertion   . Wears glasses     .  G4 P3, 1 miscarriage   .  Postpartum hemorrhage  Past Surgical History:  Procedure Laterality Date  . ABDOMINAL HYSTERECTOMY    . BACK SURGERY     x 2  . BIOPSY  01/03/2020   Procedure: BIOPSY;  Surgeon: Rush Landmark Telford Nab., MD;  Location: [facility];  Service: Gastroenterology;;  . BREAST SURGERY     bilateral cyst removal   . COLONOSCOPY WITH PROPOFOL N/A 01/03/2020   Procedure: COLONOSCOPY WITH PROPOFOL;  Surgeon: Rush Landmark Telford Nab., MD;  Location: [facility];  Service: Gastroenterology;  Laterality: N/A;  . ENTEROSCOPY N/A  09/16/2018   Procedure: ENTEROSCOPY;  Surgeon: Rush Landmark Telford Nab., MD;  Location: [facility];  Service: Gastroenterology;  Laterality: N/A;  . ENTEROSCOPY N/A 01/03/2020   Procedure: ENTEROSCOPY;  Surgeon: Rush Landmark Telford Nab., MD;  Location[facility] ENDOSCOPY;  Service: Gastroenterology;  Laterality: N/A;  . HOT HEMOSTASIS N/A 09/16/2018   Procedure: HOT HEMOSTASIS (ARGON PLASMA COAGULATION/BICAP);  Surgeon: Irving Copas., MD;  Location: [facility];  Service: Gastroenterology;  Laterality: N/A;  . POLYPECTOMY  01/03/2020   Procedure: POLYPECTOMY;  Surgeon: Rush Landmark Telford Nab., MD;  Location: [facility];  Service: Gastroenterology;;  . Lia Foyer  TATTOO INJECTION  01/03/2020   Procedure: SUBMUCOSAL TATTOO INJECTION;  Surgeon: Irving Copas., MD;  Location: [facility];  Service: Gastroenterology;;  . TONSILLECTOMY    . TOTAL HIP ARTHROPLASTY Left 05/31/2014   Procedure: LEFT TOTAL HIP ARTHROPLASTY ANTERIOR APPROACH;  Surgeon: Mauri Pole, MD;  Location: [facility];  Service: Orthopedics;  Laterality: Left;    Medications: Reviewed  Allergies:  Allergies  Allergen Reactions  . Anesthesia S-I-40 [Propofol] Nausea And Vomiting    Anesthetics -- N/V  . Codeine Nausea And Vomiting  . Meperidine Hcl     REACTION: N/V    Family history: Her mother died of colon cancer at age 71.  Her sister died of bladder cancer at age 15.  Social History:   She lives in an apartment at [place].  She lives alone.  She works for [employer] as a Designer, multimedia at [employer].  She quit smoking cigarettes in 1975.  No alcohol use.  She has received red cell transfusions.  No risk factor for HIV or hepatitis.  ROS:   Positives include: 15 pound weight loss over the past 1.5 years, intermittent rectal bleeding, intermittent left abdominal pain for years  A complete ROS was otherwise negative.  Physical Exam:  Blood pressure (!) 144/56, pulse 92, temperature (!) 97.5 F (36.4 C), temperature source Temporal, resp. rate 18, height '5\' 1"'  (1.549 m), weight 127 lb 14.4 oz (58 kg), SpO2 100 %.  HEENT: Neck without mass Lungs: Clear bilaterally Cardiac: Regular rate and rhythm Abdomen: No hepatosplenomegaly, no mass, nontender  Vascular: No leg edema Lymph nodes: No cervical, supraclavicular, axillary, or inguinal nodes Neurologic: Alert and oriented, motor exam appears intact in the upper and lower extremities bilaterally Skin: No rash, multiple benign-appearing moles over the trunk Musculoskeletal: No spine tenderness   LAB:  CBC  Lab Results  Component Value Date   WBC 9.0 01/03/2020   HGB 12.3  01/03/2020   HCT 38.5 01/03/2020   MCV 81.6 01/03/2020   PLT 402 (H) 01/03/2020   NEUTROABS 12.5 (H) 11/20/2019        CMP  Lab Results  Component Value Date   NA 136 01/03/2020   K 3.3 (L) 01/03/2020   CL 99 01/03/2020   CO2 22 01/03/2020   GLUCOSE 104 (H) 01/03/2020   BUN 12 01/03/2020   CREATININE 0.86 01/03/2020   CALCIUM 8.9 01/03/2020   PROT 5.8 (L) 01/03/2020   ALBUMIN 3.1 (L) 01/03/2020   AST 24 01/03/2020   ALT 13 01/03/2020   ALKPHOS 72 01/03/2020   BILITOT 0.5 01/03/2020   GFRNONAA 59 (L) 01/03/2020   GFRAA >60 01/03/2020     Lab Results  Component Value Date   CEA1 2.9 01/03/2020    Imaging: As per HPI, CT images from 01/11/2020 reviewed with Ms. Cicero and her daughter   Assessment/Plan:  1. Colon cancer  Cecal mass on colonoscopy 01/03/2020, biopsy confirmed adenocarcinoma  CTs 01/11/2020-cecal mass, prominent subcentimeter mesenteric nodes adjacent to the cecum, multiple tiny pulmonary nodules-metastases not favored 2. 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 and history of AVMs/Cameron's erosions 3. Hiatal hernia 4. Family history of colon and bladder cancer 5. Transverse colon tubular adenoma on the colonoscopy 01/03/2020 6. Hypertension 7. Glaucoma   Disposition:   Ms. Heffner has been diagnosed with colon cancer.  There is no clinical or CT evidence of metastatic disease.  She has been referred to Dr. Marcello Moores for resection of the primary tumor.  I discussed the treatment of colon cancer with Ms. Rawlinson and her daughter.  Her son was present by telephone.  She understands the cancer staging and treatment recommendations will depend on the surgical pathology.   I will see her after surgery to review details of the surgical pathology report and discuss adjuvant therapy.  Ms. Pulice most likely does not have hereditary nonpolyposis colon cancer syndrome, but her family members are at increased risk of developing colorectal cancer and should receive  appropriate screening.  We will check mismatch repair protein expression and MSI testing on the resected tumor.  Ms. Scarberry will return for an office visit 2 to 3 weeks after surgery.  Betsy Coder, MD  01/17/2020, 2:21 PM

## 2020-01-17 NOTE — Progress Notes (Signed)
Met with patient and her daughter Tye Maryland today at their initial medical oncology appointment with Dr. Benay Spice.  I had previously spoken with Tye Maryland on the phone.  I explained my role as nurse navigator and they were both given my card with my direct contact information.  They were encouraged to call with any questions or concerns.  They verbalized an understanding about the plan to see Dr. Marcello Moores at [facility] on 7/7 and have surgery and then follow up with Dr. Benay Spice a couple of weeks after her surgery.

## 2020-01-18 ENCOUNTER — Encounter: Payer: Self-pay | Admitting: *Deleted

## 2020-01-18 NOTE — Progress Notes (Signed)
[facility] Psychosocial Distress Screening Clinical Social Work  Clinical Social Work was referred by distress screening protocol.  The patient scored a 10 on the Psychosocial Distress Thermometer which indicates severe distress. Clinical Social Worker contacted patient by phone to assess for distress and other psychosocial needs.  Patient stated she was feeling much better after meeting with her treatment team and getting more information.  CSW and patient discussed common feelings and emotions when being diagnosed with cancer, and the importance of support.  Patient stated she "new what to expect" after going through the process with her husband.  Patient identified her children as positive support.  CSW provided education on CSW role, and information on the support team and support services at [facility].  Patient did not identify any additional concerns at this time.  CSW provided contact information and encouraged patient to call with needs or concerns.       [facility] DISTRESS SCREENING 01/17/2020  Screening Type Initial Screening  Distress experienced in past week (1-10) 10  Emotional problem type Nervousness/Anxiety;Adjusting to illness  Spiritual/Religous concerns type Facing my mortality  Information Concerns Type Lack of info about diagnosis;Lack of info about treatment;Lack of info about complementary therapy choices  Physician notified of physical symptoms (No Data)  Referral to clinical psychology No  Referral to clinical social work No  Referral to dietition No  Referral to financial advocate No  Referral to support programs No  Referral to palliative care No    Johnnye Lana, MSW, LCSW, OSW-C Clinical Social Worker [facility] [phone]

## 2020-01-26 ENCOUNTER — Ambulatory Visit: Payer: Self-pay | Admitting: General Surgery

## 2020-01-26 DIAGNOSIS — C182 Malignant neoplasm of ascending colon: Secondary | ICD-10-CM | POA: Diagnosis not present

## 2020-01-26 NOTE — H&P (Signed)
The patient is a 86 year old female who presents with colorectal cancer. 86 year old female who presents to the office with a cecal mass. She underwent a colonoscopy January 03, 2020. Mass was noted in the cecum and tattooed. Biopsy showed adenocarcinoma. CT scan shows thickened central wall thickening of the cecum and terminal ileum with some mildly enlarged mesenteric lymph nodes. There are multiple small small pulmonary nodules. These are nonspecific. There is a large hiatal hernia. CEA was 2.9. Abdominal surgery consisted of hysterectomy. Patient is in assisted living, but handles all of her daily activities by herself.   Past Surgical History Antonietta Jewel, [place]; 01/26/2020 9:39 AM) Colon Polyp Removal - Colonoscopy Spinal Surgery - Lower Back  Diagnostic Studies History (Chanel Teressa Senter, CMA; 01/26/2020 9:39 AM) Colonoscopy within last year 5-10 years ago Mammogram 1-3 years ago Pap Smear >5 years ago  Allergies ([place], CMA; 01/26/2020 9:40 AM) No Known Drug Allergies [10/10/2017]: Allergies Reconciled  Medication History (Chanel Teressa Senter, CMA; 01/26/2020 9:41 AM) Potassium Chloride ER (10MEQ Tablet ER, Oral) Active. Tylenol (500MG  Capsule, Oral) Active. Vitamin D (2000UNIT Capsule, Oral) Active. Colace (100MG  Capsule, Oral) Active. TiZANidine HCl (4MG  Capsule, Oral) Active. Amlodipine Besy-Benazepril HCl (2.5-10MG  Capsule, Oral) Active. Latanoprost (0.005% Solution, Ophthalmic) Active. HydroCHLOROthiazide (25MG  Tablet, Oral) Active. Pantoprazole Sodium (40MG  Tablet DR, Oral) Active. Medications Reconciled  Social History Antonietta Jewel, CMA; 01/26/2020 9:39 AM) Caffeine use Carbonated beverages, Coffee, Tea. No alcohol use No drug use Tobacco use Former smoker.  Family History Antonietta Jewel, [place]; 01/26/2020 9:39 AM) Breast Cancer Sister. Colon Cancer Mother. Heart Disease Father. Respiratory Condition Sister.  Pregnancy / Birth History Antonietta Jewel, CMA; 01/26/2020 9:39 AM) Age at menarche 8 years, 25 years. Age of menopause <45 62-60 Gravida 3 Maternal age 12-25 Para 3  Other Problems Antonietta Jewel, [place]; 01/26/2020 9:39 AM) Colon Cancer Gastroesophageal Reflux Disease High blood pressure Inguinal Hernia     Review of Systems (Chanel Nolan CMA; 01/26/2020 9:39 AM) General Present- Fatigue, Night Sweats and Weight Loss. Not Present- Appetite Loss, Chills, Fever and Weight Gain. Gastrointestinal Present- Abdominal Pain, Bloody Stool, Constipation and Indigestion. Not Present- Bloating, Change in Bowel Habits, Chronic diarrhea, Difficulty Swallowing, Excessive gas, Gets full quickly at meals, Hemorrhoids, Nausea, Rectal Pain and Vomiting. Musculoskeletal Present- Back Pain. Not Present- Joint Pain, Joint Stiffness, Muscle Pain, Muscle Weakness and Swelling of Extremities. Neurological Present- Weakness. Not Present- Decreased Memory, Fainting, Headaches, Numbness, Seizures, Tingling, Tremor and Trouble walking.  Vitals (Chanel Nolan CMA; 01/26/2020 9:41 AM) 01/26/2020 9:41 AM Weight: 130.13 lb Height: 61in Body Surface Area: 1.57 m Body Mass Index: 24.59 kg/m  Temp.: 97.79F  Pulse: 98 (Regular)  BP: 132/62(Sitting, Left Arm, Standard)        Physical Exam Leighton Ruff MD; 03/22/4781 10:16 AM)  General Mental Status-Alert. General Appearance-Cooperative.  Abdomen Palpation/Percussion Palpation and Percussion of the abdomen reveal - Soft and Non Tender.    Assessment & Plan Leighton Ruff MD; 03/26/6212 10:15 AM)  COLON CANCER, ASCENDING (C18.2) Impression: 86 year old female who presents to the office for evaluation of a newly diagnosed colon cancer. This was seen on colonoscopy. Biopsies show adenocarcinoma. The area was tattooed. CT scan shows no sign of distant metastatic disease. I have recommended a laparoscopic partial colectomy. We have discussed this in detail. All questions were  answered. The surgery and anatomy were described to the patient as well as the risks of surgery and the possible complications. These include: Bleeding, deep abdominal infections and possible wound complications such as hernia  and infection, damage to adjacent structures, leak of surgical connections, which can lead to other surgeries and possibly an ostomy, possible need for other procedures, such as abscess drains in radiology, possible prolonged hospital stay, possible diarrhea from removal of part of the colon, possible constipation from narcotics, possible bowel, bladder or sexual dysfunction if having rectal surgery, prolonged fatigue/weakness or appetite loss, possible early recurrence of of disease, possible complications of their medical problems such as heart disease or arrhythmias or lung problems, death (less than 1%). I believe the patient understands and wishes to proceed with the surgery.

## 2020-01-26 NOTE — H&P (View-Only) (Signed)
The patient is a 86 year old female who presents with colorectal cancer. 86 year old female who presents to the office with a cecal mass. She underwent a colonoscopy January 03, 2020. Mass was noted in the cecum and tattooed. Biopsy showed adenocarcinoma. CT scan shows thickened central wall thickening of the cecum and terminal ileum with some mildly enlarged mesenteric lymph nodes. There are multiple small small pulmonary nodules. These are nonspecific. There is a large hiatal hernia. CEA was 2.9. Abdominal surgery consisted of hysterectomy. Patient is in assisted living, but handles all of her daily activities by herself.   Past Surgical History Antonietta Jewel, [place]; 01/26/2020 9:39 AM) Colon Polyp Removal - Colonoscopy Spinal Surgery - Lower Back  Diagnostic Studies History (Chanel Teressa Senter, CMA; 01/26/2020 9:39 AM) Colonoscopy within last year 5-10 years ago Mammogram 1-3 years ago Pap Smear >5 years ago  Allergies ([place], CMA; 01/26/2020 9:40 AM) No Known Drug Allergies [10/10/2017]: Allergies Reconciled  Medication History (Chanel Teressa Senter, CMA; 01/26/2020 9:41 AM) Potassium Chloride ER (10MEQ Tablet ER, Oral) Active. Tylenol (500MG  Capsule, Oral) Active. Vitamin D (2000UNIT Capsule, Oral) Active. Colace (100MG  Capsule, Oral) Active. TiZANidine HCl (4MG  Capsule, Oral) Active. Amlodipine Besy-Benazepril HCl (2.5-10MG  Capsule, Oral) Active. Latanoprost (0.005% Solution, Ophthalmic) Active. HydroCHLOROthiazide (25MG  Tablet, Oral) Active. Pantoprazole Sodium (40MG  Tablet DR, Oral) Active. Medications Reconciled  Social History Antonietta Jewel, CMA; 01/26/2020 9:39 AM) Caffeine use Carbonated beverages, Coffee, Tea. No alcohol use No drug use Tobacco use Former smoker.  Family History Antonietta Jewel, [place]; 01/26/2020 9:39 AM) Breast Cancer Sister. Colon Cancer Mother. Heart Disease Father. Respiratory Condition Sister.  Pregnancy / Birth History Antonietta Jewel, CMA; 01/26/2020 9:39 AM) Age at menarche 45 years, 70 years. Age of menopause <45 69-60 Gravida 3 Maternal age 25-25 Para 3  Other Problems Antonietta Jewel, [place]; 01/26/2020 9:39 AM) Colon Cancer Gastroesophageal Reflux Disease High blood pressure Inguinal Hernia     Review of Systems (Chanel Nolan CMA; 01/26/2020 9:39 AM) General Present- Fatigue, Night Sweats and Weight Loss. Not Present- Appetite Loss, Chills, Fever and Weight Gain. Gastrointestinal Present- Abdominal Pain, Bloody Stool, Constipation and Indigestion. Not Present- Bloating, Change in Bowel Habits, Chronic diarrhea, Difficulty Swallowing, Excessive gas, Gets full quickly at meals, Hemorrhoids, Nausea, Rectal Pain and Vomiting. Musculoskeletal Present- Back Pain. Not Present- Joint Pain, Joint Stiffness, Muscle Pain, Muscle Weakness and Swelling of Extremities. Neurological Present- Weakness. Not Present- Decreased Memory, Fainting, Headaches, Numbness, Seizures, Tingling, Tremor and Trouble walking.  Vitals (Chanel Nolan CMA; 01/26/2020 9:41 AM) 01/26/2020 9:41 AM Weight: 130.13 lb Height: 61in Body Surface Area: 1.57 m Body Mass Index: 24.59 kg/m  Temp.: 97.96F  Pulse: 98 (Regular)  BP: 132/62(Sitting, Left Arm, Standard)        Physical Exam Leighton Ruff MD; 09/24/6292 10:16 AM)  General Mental Status-Alert. General Appearance-Cooperative.  Abdomen Palpation/Percussion Palpation and Percussion of the abdomen reveal - Soft and Non Tender.    Assessment & Plan Leighton Ruff MD; 01/25/5464 10:15 AM)  COLON CANCER, ASCENDING (C18.2) Impression: 86 year old female who presents to the office for evaluation of a newly diagnosed colon cancer. This was seen on colonoscopy. Biopsies show adenocarcinoma. The area was tattooed. CT scan shows no sign of distant metastatic disease. I have recommended a laparoscopic partial colectomy. We have discussed this in detail. All questions were  answered. The surgery and anatomy were described to the patient as well as the risks of surgery and the possible complications. These include: Bleeding, deep abdominal infections and possible wound complications such as hernia  and infection, damage to adjacent structures, leak of surgical connections, which can lead to other surgeries and possibly an ostomy, possible need for other procedures, such as abscess drains in radiology, possible prolonged hospital stay, possible diarrhea from removal of part of the colon, possible constipation from narcotics, possible bowel, bladder or sexual dysfunction if having rectal surgery, prolonged fatigue/weakness or appetite loss, possible early recurrence of of disease, possible complications of their medical problems such as heart disease or arrhythmias or lung problems, death (less than 1%). I believe the patient understands and wishes to proceed with the surgery.

## 2020-02-01 DIAGNOSIS — D500 Iron deficiency anemia secondary to blood loss (chronic): Secondary | ICD-10-CM | POA: Diagnosis not present

## 2020-02-01 DIAGNOSIS — C182 Malignant neoplasm of ascending colon: Secondary | ICD-10-CM | POA: Diagnosis not present

## 2020-02-01 DIAGNOSIS — N1832 Chronic kidney disease, stage 3b: Secondary | ICD-10-CM | POA: Diagnosis not present

## 2020-02-01 DIAGNOSIS — I129 Hypertensive chronic kidney disease with stage 1 through stage 4 chronic kidney disease, or unspecified chronic kidney disease: Secondary | ICD-10-CM | POA: Diagnosis not present

## 2020-02-03 NOTE — Patient Instructions (Addendum)
DUE TO COVID-19 ONLY ONE VISITOR ARE ALLOWED TO COME WITH YOU AND STAY IN THE WAITING ROOM ONLY DURING PRE OP AND  PROCEDURE. THEN TWO VISITORS MAY VISIT WITH  YOU IN YOUR PRIVATE ROOM DURING VISITING HOURS ONLY!! (10AM-8PM)   COVID SWAB TESTING MUST BE COMPLETED ON:   02-07-20 @ 11:05 AM [address] [place] [facility] enter pre surgical testing line (Must self quarantine after testing. Follow instructions on handout.)             Your procedure is scheduled on: 02-10-20   Report to [facility] Main  Entrance   Report to Short Stay at 5:30 AM   [facility])   Call this number if you have problems the morning of surgery [place] DIET. AT 10:00 PM DRINK 2 PRESURGERY ENSURE DRINKS. ON DAY OF SURGERY,  PLEASE FINISH THE LAST PRESURGERY ENSURE DRINK 3 HOURS PRIOR TO SURGERY TIME WHICH NEEDS TO BE COMPLETED AT 4:30 AM.   CLEAR LIQUID DIET  Foods Allowed                                                                     Foods Excluded  Water, Black Coffee and tea, regular and decaf              liquids that you cannot  Plain Jell-O in any flavor  (No red)                                     see through such as: Fruit ices (not with fruit pulp)                                      milk, soups, orange juice  Iced Popsicles (No red)                                     All solid food                                   Apple juices Sports drinks like Gatorade (No red) Lightly seasoned clear broth or consume(fat free) Sugar, honey syrup  Sample Menu Breakfast                                Lunch                                     Supper Cranberry juice                    Beef broth                            Chicken  broth Jell-O                                     Grape juice                           Apple juice Coffee or tea                        Jell-O                                      Popsicle                                                 Coffee or tea                        Coffee or tea     Take these medicines the morning of surgery with A SIP OF WATER: Amlodipine (Norvasc), Pantoprazole (Protonix)  Oral Hygiene is also important to reduce your risk of infection.                                     Remember - BRUSH YOUR TEETH THE MORNING OF SURGERY WITH YOUR REGULAR TOOTHPASTE.   AFTERWARDS NO WATER, GUM, CANDY OR MINTS                               You may not have any metal on your body including hair pins, jewelry, and body piercings              Do not wear make-up, lotions, powders, perfumes, or deodorant              Do not wear nail polish.  Do not shave  48 hours prior to surgery.     Do not bring valuables to the hospital. [place].   Contacts, dentures or bridgework may not be worn into surgery.   Bring small overnight bag day of surgery.    Special Instructions: N/A              Please read over the following fact sheets you were given: IF YOU HAVE QUESTIONS ABOUT YOUR PRE OP INSTRUCTIONS PLEASE CALL [phone]     [place] - Preparing for Surgery Before surgery, you can play an important role.  Because skin is not sterile, your skin needs to be as free of germs as possible.  You can reduce the number of germs on your skin by washing with CHG (chlorahexidine gluconate) soap before surgery.  CHG is an antiseptic cleaner which kills germs and bonds with the skin to continue killing germs even after washing. Please DO NOT use if you have an allergy to CHG or antibacterial soaps.  If your skin becomes reddened/irritated stop using the CHG and inform your nurse when you arrive at Short Stay. Do not shave (including legs and underarms) for at least 48 hours  prior to the first CHG shower.  You may shave your face/neck.  Please follow these instructions carefully:  1.  Shower with CHG Soap the night before surgery and the   morning of surgery.  2.  If you choose to wash your hair, wash your hair first as usual with your normal  shampoo.  3.  After you shampoo, rinse your hair and body thoroughly to remove the shampoo.                             4.  Use CHG as you would any other liquid soap.  You can apply chg directly to the skin and wash.  Gently with a scrungie or clean washcloth.  5.  Apply the CHG Soap to your body ONLY FROM THE NECK DOWN.   Do   not use on face/ open                           Wound or open sores. Avoid contact with eyes, ears mouth and   genitals (private parts).                       Wash face,  Genitals (private parts) with your normal soap.             6.  Wash thoroughly, paying special attention to the area where your    surgery  will be performed.  7.  Thoroughly rinse your body with warm water from the neck down.  8.  DO NOT shower/wash with your normal soap after using and rinsing off the CHG Soap.                9.  Pat yourself dry with a clean towel.            10.  Wear clean pajamas.            11.  Place clean sheets on your bed the night of your first shower and do not  sleep with pets. Day of Surgery : Do not apply any lotions/deodorants the morning of surgery.  Please wear clean clothes to the hospital/surgery center.  FAILURE TO FOLLOW THESE INSTRUCTIONS MAY RESULT IN THE CANCELLATION OF YOUR SURGERY  PATIENT SIGNATURE_________________________________  NURSE SIGNATURE__________________________________  ________________________________________________________________________     Adam Phenix  An incentive spirometer is a tool that can help keep your lungs clear and active. This tool measures how well you are filling your lungs with each breath. Taking long deep breaths may help reverse or decrease the chance of developing breathing (pulmonary) problems (especially infection) following:  A long period of time when you are unable to move or be  active. BEFORE THE PROCEDURE   If the spirometer includes an indicator to show your best effort, your nurse or respiratory therapist will set it to a desired goal.  If possible, sit up straight or lean slightly forward. Try not to slouch.  Hold the incentive spirometer in an upright position. INSTRUCTIONS FOR USE  1. Sit on the edge of your bed if possible, or sit up as far as you can in bed or on a chair. 2. Hold the incentive spirometer in an upright position. 3. Breathe out normally. 4. Place the mouthpiece in your mouth and seal your lips tightly around it. 5. Breathe in slowly and as deeply as possible, raising the piston or the  ball toward the top of the column. 6. Hold your breath for 3-5 seconds or for as long as possible. Allow the piston or ball to fall to the bottom of the column. 7. Remove the mouthpiece from your mouth and breathe out normally. 8. Rest for a few seconds and repeat Steps 1 through 7 at least 10 times every 1-2 hours when you are awake. Take your time and take a few normal breaths between deep breaths. 9. The spirometer may include an indicator to show your best effort. Use the indicator as a goal to work toward during each repetition. 10. After each set of 10 deep breaths, practice coughing to be sure your lungs are clear. If you have an incision (the cut made at the time of surgery), support your incision when coughing by placing a pillow or rolled up towels firmly against it. Once you are able to get out of bed, walk around indoors and cough well. You may stop using the incentive spirometer when instructed by your caregiver.  RISKS AND COMPLICATIONS  Take your time so you do not get dizzy or light-headed.  If you are in pain, you may need to take or ask for pain medication before doing incentive spirometry. It is harder to take a deep breath if you are having pain. AFTER USE  Rest and breathe slowly and easily.  It can be helpful to keep track of a log of  your progress. Your caregiver can provide you with a simple table to help with this. If you are using the spirometer at home, follow these instructions: [place] IF:   You are having difficultly using the spirometer.  You have trouble using the spirometer as often as instructed.  Your pain medication is not giving enough relief while using the spirometer.  You develop fever of 100.5 F (38.1 C) or higher. SEEK IMMEDIATE MEDICAL CARE IF:   You cough up bloody sputum that had not been present before.  You develop fever of 102 F (38.9 C) or greater.  You develop worsening pain at or near the incision site. MAKE SURE YOU:   Understand these instructions.  Will watch your condition.  Will get help right away if you are not doing well or get worse. Document Released: 11/18/2006 Document Revised: 09/30/2011 Document Reviewed: 01/19/2007 ExitCare Patient Information 2014 ExitCare, [place].   ________________________________________________________________________  WHAT IS A BLOOD TRANSFUSION? Blood Transfusion Information  A transfusion is the replacement of blood or some of its parts. Blood is made up of multiple cells which provide different functions.  Red blood cells carry oxygen and are used for blood loss replacement.  White blood cells fight against infection.  Platelets control bleeding.  Plasma helps clot blood.  Other blood products are available for specialized needs, such as hemophilia or other clotting disorders. BEFORE THE TRANSFUSION  Who gives blood for transfusions?   Healthy volunteers who are fully evaluated to make sure their blood is safe. This is blood bank blood. Transfusion therapy is the safest it has ever been in the practice of medicine. Before blood is taken from a donor, a complete history is taken to make sure that person has no history of diseases nor engages in risky social behavior (examples are intravenous drug use or sexual activity  with multiple partners). The donor's travel history is screened to minimize risk of transmitting infections, such as malaria. The donated blood is tested for signs of infectious diseases, such as HIV and hepatitis. The blood  is then tested to be sure it is compatible with you in order to minimize the chance of a transfusion reaction. If you or a relative donates blood, this is often done in anticipation of surgery and is not appropriate for emergency situations. It takes many days to process the donated blood. RISKS AND COMPLICATIONS Although transfusion therapy is very safe and saves many lives, the main dangers of transfusion include:   Getting an infectious disease.  Developing a transfusion reaction. This is an allergic reaction to something in the blood you were given. Every precaution is taken to prevent this. The decision to have a blood transfusion has been considered carefully by your caregiver before blood is given. Blood is not given unless the benefits outweigh the risks. AFTER THE TRANSFUSION  Right after receiving a blood transfusion, you will usually feel much better and more energetic. This is especially true if your red blood cells have gotten low (anemic). The transfusion raises the level of the red blood cells which carry oxygen, and this usually causes an energy increase.  The nurse administering the transfusion will monitor you carefully for complications. HOME CARE INSTRUCTIONS  No special instructions are needed after a transfusion. You may find your energy is better. Speak with your caregiver about any limitations on activity for underlying diseases you may have. SEEK MEDICAL CARE IF:   Your condition is not improving after your transfusion.  You develop redness or irritation at the intravenous (IV) site. SEEK IMMEDIATE MEDICAL CARE IF:  Any of the following symptoms occur over the next 12 hours:  Shaking chills.  You have a temperature by mouth above 102 F (38.9  C), not controlled by medicine.  Chest, back, or muscle pain.  People around you feel you are not acting correctly or are confused.  Shortness of breath or difficulty breathing.  Dizziness and fainting.  You get a rash or develop hives.  You have a decrease in urine output.  Your urine turns a dark color or changes to pink, red, or brown. Any of the following symptoms occur over the next 10 days:  You have a temperature by mouth above 102 F (38.9 C), not controlled by medicine.  Shortness of breath.  Weakness after normal activity.  The white part of the eye turns yellow (jaundice).  You have a decrease in the amount of urine or are urinating less often.  Your urine turns a dark color or changes to pink, red, or brown. Document Released: 07/05/2000 Document Revised: 09/30/2011 Document Reviewed: 02/22/2008 [facility] Patient Information 2014 [place], [place].  _______________________________________________________________________

## 2020-02-03 NOTE — Progress Notes (Addendum)
COVID Vaccine Completed: Date COVID Vaccine completed: COVID vaccine manufacturer: Pfizer    Moderna   [employer]   PCP - Lajean Manes, MD Cardiologist - N/A No stimulator in back   Chest x-ray - 01-11-20 CT EKG -  Stress Test -  ECHO -  Cardiac Cath -   Sleep Study -  CPAP -   Fasting Blood Sugar -  Checks Blood Sugar _____ times a day  Blood Thinner Instructions: Aspirin Instructions: Last Dose:  No SOB w/ ADL's  Anesthesia review:   Patient denies shortness of breath, fever, cough and chest pain at PAT appointment   Patient verbalized understanding of instructions that were given to them at the PAT appointment. Patient was also instructed that they will need to review over the PAT instructions again at home before surgery.

## 2020-02-07 ENCOUNTER — Other Ambulatory Visit (HOSPITAL_COMMUNITY)
Admission: RE | Admit: 2020-02-07 | Discharge: 2020-02-07 | Disposition: A | Discharge status: Home / Self Care | Payer: Medicare Other | Source: Ambulatory Visit | Attending: General Surgery | Admitting: General Surgery

## 2020-02-07 ENCOUNTER — Encounter (HOSPITAL_COMMUNITY): Payer: Self-pay

## 2020-02-07 ENCOUNTER — Other Ambulatory Visit: Payer: Self-pay

## 2020-02-07 ENCOUNTER — Encounter (HOSPITAL_COMMUNITY)
Admission: RE | Admit: 2020-02-07 | Discharge: 2020-02-07 | Disposition: A | Discharge status: Home / Self Care | Payer: Medicare Other | Source: Ambulatory Visit | Attending: General Surgery | Admitting: General Surgery

## 2020-02-07 DIAGNOSIS — K31819 Angiodysplasia of stomach and duodenum without bleeding: Secondary | ICD-10-CM | POA: Diagnosis not present

## 2020-02-07 DIAGNOSIS — D630 Anemia in neoplastic disease: Secondary | ICD-10-CM | POA: Diagnosis not present

## 2020-02-07 DIAGNOSIS — Z5331 Laparoscopic surgical procedure converted to open procedure: Secondary | ICD-10-CM | POA: Diagnosis not present

## 2020-02-07 DIAGNOSIS — R4189 Other symptoms and signs involving cognitive functions and awareness: Secondary | ICD-10-CM | POA: Diagnosis not present

## 2020-02-07 DIAGNOSIS — C772 Secondary and unspecified malignant neoplasm of intra-abdominal lymph nodes: Secondary | ICD-10-CM | POA: Diagnosis not present

## 2020-02-07 DIAGNOSIS — M4326 Fusion of spine, lumbar region: Secondary | ICD-10-CM | POA: Diagnosis not present

## 2020-02-07 DIAGNOSIS — Z20822 Contact with and (suspected) exposure to covid-19: Secondary | ICD-10-CM | POA: Insufficient documentation

## 2020-02-07 DIAGNOSIS — E876 Hypokalemia: Secondary | ICD-10-CM | POA: Diagnosis not present

## 2020-02-07 DIAGNOSIS — R590 Localized enlarged lymph nodes: Secondary | ICD-10-CM | POA: Diagnosis not present

## 2020-02-07 DIAGNOSIS — E7800 Pure hypercholesterolemia, unspecified: Secondary | ICD-10-CM | POA: Diagnosis not present

## 2020-02-07 DIAGNOSIS — C182 Malignant neoplasm of ascending colon: Secondary | ICD-10-CM | POA: Diagnosis not present

## 2020-02-07 DIAGNOSIS — Z87891 Personal history of nicotine dependence: Secondary | ICD-10-CM | POA: Diagnosis not present

## 2020-02-07 DIAGNOSIS — Z01812 Encounter for preprocedural laboratory examination: Secondary | ICD-10-CM | POA: Insufficient documentation

## 2020-02-07 DIAGNOSIS — D500 Iron deficiency anemia secondary to blood loss (chronic): Secondary | ICD-10-CM | POA: Diagnosis not present

## 2020-02-07 DIAGNOSIS — Z79899 Other long term (current) drug therapy: Secondary | ICD-10-CM | POA: Diagnosis not present

## 2020-02-07 DIAGNOSIS — M6281 Muscle weakness (generalized): Secondary | ICD-10-CM | POA: Diagnosis not present

## 2020-02-07 DIAGNOSIS — E871 Hypo-osmolality and hyponatremia: Secondary | ICD-10-CM | POA: Diagnosis not present

## 2020-02-07 DIAGNOSIS — K219 Gastro-esophageal reflux disease without esophagitis: Secondary | ICD-10-CM | POA: Diagnosis not present

## 2020-02-07 DIAGNOSIS — C189 Malignant neoplasm of colon, unspecified: Secondary | ICD-10-CM | POA: Diagnosis not present

## 2020-02-07 DIAGNOSIS — K5520 Angiodysplasia of colon without hemorrhage: Secondary | ICD-10-CM | POA: Diagnosis not present

## 2020-02-07 DIAGNOSIS — Z9849 Cataract extraction status, unspecified eye: Secondary | ICD-10-CM | POA: Diagnosis not present

## 2020-02-07 DIAGNOSIS — R29898 Other symptoms and signs involving the musculoskeletal system: Secondary | ICD-10-CM | POA: Diagnosis not present

## 2020-02-07 DIAGNOSIS — I10 Essential (primary) hypertension: Secondary | ICD-10-CM | POA: Diagnosis not present

## 2020-02-07 LAB — CBC
HCT: 35.9 % — ABNORMAL LOW (ref 36.0–46.0)
Hemoglobin: 11.2 g/dL — ABNORMAL LOW (ref 12.0–15.0)
MCH: 24.6 pg — ABNORMAL LOW (ref 26.0–34.0)
MCHC: 31.2 g/dL (ref 30.0–36.0)
MCV: 78.9 fL — ABNORMAL LOW (ref 80.0–100.0)
Platelets: 460 10*3/uL — ABNORMAL HIGH (ref 150–400)
RBC: 4.55 MIL/uL (ref 3.87–5.11)
RDW: 14.6 % (ref 11.5–15.5)
WBC: 10.3 10*3/uL (ref 4.0–10.5)
nRBC: 0 % (ref 0.0–0.2)

## 2020-02-07 LAB — BASIC METABOLIC PANEL
Anion gap: 15 (ref 5–15)
BUN: 23 mg/dL (ref 8–23)
CO2: 26 mmol/L (ref 22–32)
Calcium: 9.5 mg/dL (ref 8.9–10.3)
Chloride: 96 mmol/L — ABNORMAL LOW (ref 98–111)
Creatinine, Ser: 1.09 mg/dL — ABNORMAL HIGH (ref 0.44–1.00)
GFR calc Af Amer: 51 mL/min — ABNORMAL LOW (ref >60)
GFR calc non Af Amer: 44 mL/min — ABNORMAL LOW (ref >60)
Glucose, Bld: 96 mg/dL (ref 70–99)
Potassium: 3.8 mmol/L (ref 3.5–5.1)
Sodium: 137 mmol/L (ref 135–145)

## 2020-02-07 LAB — SARS CORONAVIRUS 2 (TAT 6-24 HRS): SARS Coronavirus 2: NEGATIVE

## 2020-02-09 MED ORDER — BUPIVACAINE LIPOSOME 1.3 % IJ SUSP
20.0000 mL | Freq: Once | INTRAMUSCULAR | Status: DC
  Filled 2020-02-09: qty 20

## 2020-02-09 NOTE — Anesthesia Preprocedure Evaluation (Addendum)
Anesthesia Evaluation  Patient identified by MRN, date of birth, ID band Patient awake    Reviewed: Allergy & Precautions, NPO status , Patient's Chart, lab work & pertinent test results  History of Anesthesia Complications (+) PONV  Airway Mallampati: II  TM Distance: >3 FB Neck ROM: Full    Dental  (+) Dental Advisory Given   Pulmonary neg pulmonary ROS, former smoker,    breath sounds clear to auscultation       Cardiovascular hypertension, Pt. on medications + dysrhythmias (LBBB)  Rhythm:Regular Rate:Normal  EKG LBBB   Neuro/Psych PSYCHIATRIC DISORDERS Anxiety negative neurological ROS     GI/Hepatic Neg liver ROS, hiatal hernia, PUD, GERD  Medicated,Colon CA   Endo/Other  negative endocrine ROS  Renal/GU negative Renal ROS  negative genitourinary   Musculoskeletal  (+) Arthritis ,   Abdominal   Peds  Hematology  (+) Blood dyscrasia, anemia ,   Anesthesia Other Findings   Reproductive/Obstetrics                             Lab Results  Component Value Date   WBC 10.3 02/07/2020   HGB 11.2 (L) 02/07/2020   HCT 35.9 (L) 02/07/2020   MCV 78.9 (L) 02/07/2020   PLT 460 (H) 02/07/2020   Lab Results  Component Value Date   CREATININE 1.09 (H) 02/07/2020   BUN 23 02/07/2020   NA 137 02/07/2020   K 3.8 02/07/2020   CL 96 (L) 02/07/2020   CO2 26 02/07/2020    Anesthesia Physical  Anesthesia Plan  ASA: III  Anesthesia Plan: General   Post-op Pain Management:    Induction: Intravenous  PONV Risk Score and Plan: 4 or greater and Treatment may vary due to age or medical condition, Dexamethasone, Ondansetron and Propofol infusion  Airway Management Planned: Oral ETT  Additional Equipment: None  Intra-op Plan:   Post-operative Plan: Extubation in OR  Informed Consent: I have reviewed the patients History and Physical, chart, labs and discussed the procedure including  the risks, benefits and alternatives for the proposed anesthesia with the patient or authorized representative who has indicated his/her understanding and acceptance.     Dental advisory given  Plan Discussed with: CRNA  Anesthesia Plan Comments:        Anesthesia Quick Evaluation

## 2020-02-10 ENCOUNTER — Other Ambulatory Visit: Payer: Self-pay

## 2020-02-10 ENCOUNTER — Inpatient Hospital Stay (HOSPITAL_COMMUNITY)
Admission: RE | Admit: 2020-02-10 | Discharge: 2020-02-14 | DRG: 330 | Disposition: A | Discharge status: Skilled Nursing Facility | Payer: Medicare Other | Source: Skilled Nursing Facility | Attending: General Surgery | Admitting: General Surgery

## 2020-02-10 ENCOUNTER — Encounter (HOSPITAL_COMMUNITY)
Admission: RE | Disposition: A | Discharge status: Home / Self Care | Payer: Self-pay | Source: Home / Self Care | Attending: General Surgery

## 2020-02-10 ENCOUNTER — Inpatient Hospital Stay (HOSPITAL_COMMUNITY): Payer: Medicare Other | Admitting: Physician Assistant

## 2020-02-10 ENCOUNTER — Inpatient Hospital Stay (HOSPITAL_COMMUNITY): Payer: Medicare Other | Admitting: Anesthesiology

## 2020-02-10 ENCOUNTER — Encounter (HOSPITAL_COMMUNITY): Payer: Self-pay | Admitting: General Surgery

## 2020-02-10 DIAGNOSIS — C182 Malignant neoplasm of ascending colon: Secondary | ICD-10-CM | POA: Diagnosis present

## 2020-02-10 DIAGNOSIS — Z79899 Other long term (current) drug therapy: Secondary | ICD-10-CM | POA: Diagnosis not present

## 2020-02-10 DIAGNOSIS — R590 Localized enlarged lymph nodes: Secondary | ICD-10-CM | POA: Diagnosis present

## 2020-02-10 DIAGNOSIS — C772 Secondary and unspecified malignant neoplasm of intra-abdominal lymph nodes: Secondary | ICD-10-CM | POA: Diagnosis present

## 2020-02-10 DIAGNOSIS — I10 Essential (primary) hypertension: Secondary | ICD-10-CM | POA: Diagnosis present

## 2020-02-10 DIAGNOSIS — Z87891 Personal history of nicotine dependence: Secondary | ICD-10-CM

## 2020-02-10 DIAGNOSIS — Z20822 Contact with and (suspected) exposure to covid-19: Secondary | ICD-10-CM | POA: Diagnosis present

## 2020-02-10 DIAGNOSIS — K219 Gastro-esophageal reflux disease without esophagitis: Secondary | ICD-10-CM | POA: Diagnosis present

## 2020-02-10 HISTORY — PX: LAPAROSCOPIC PARTIAL COLECTOMY: SHX5907

## 2020-02-10 LAB — TYPE AND SCREEN
ABO/RH(D): O POS
Antibody Screen: NEGATIVE

## 2020-02-10 SURGERY — LAPAROSCOPIC PARTIAL COLECTOMY
Anesthesia: General | Site: Abdomen

## 2020-02-10 MED ORDER — ENSURE PRE-SURGERY PO LIQD
592.0000 mL | Freq: Once | ORAL | Status: DC
  Filled 2020-02-10: qty 592

## 2020-02-10 MED ORDER — LATANOPROST 0.005 % OP SOLN
1.0000 [drp] | Freq: Every day | OPHTHALMIC | Status: DC
  Administered 2020-02-10 – 2020-02-13 (×4): 1 [drp] via OPHTHALMIC
  Filled 2020-02-10: qty 2.5

## 2020-02-10 MED ORDER — PROPOFOL 500 MG/50ML IV EMUL
INTRAVENOUS | Status: DC | PRN
  Administered 2020-02-10: 25 ug/kg/min via INTRAVENOUS

## 2020-02-10 MED ORDER — PROPOFOL 10 MG/ML IV BOLUS: INTRAVENOUS | Status: DC | PRN

## 2020-02-10 MED ORDER — BUPIVACAINE-EPINEPHRINE 0.25% -1:200000 IJ SOLN
INTRAMUSCULAR | Status: DC | PRN
  Administered 2020-02-10: 30 mL

## 2020-02-10 MED ORDER — ONDANSETRON HCL 4 MG/2ML IJ SOLN
INTRAMUSCULAR | Status: AC
  Filled 2020-02-10: qty 2

## 2020-02-10 MED ORDER — ONDANSETRON HCL 4 MG PO TABS: 4.0000 mg | ORAL_TABLET | Freq: Four times a day (QID) | ORAL | Status: DC | PRN

## 2020-02-10 MED ORDER — SODIUM CHLORIDE 0.9 % IV SOLN
2.0000 g | INTRAVENOUS | Status: AC
  Administered 2020-02-10: 2 g via INTRAVENOUS
  Filled 2020-02-10: qty 2

## 2020-02-10 MED ORDER — HYDROMORPHONE HCL 1 MG/ML IJ SOLN
0.5000 mg | INTRAMUSCULAR | Status: DC | PRN
  Administered 2020-02-11: 0.5 mg via INTRAVENOUS
  Filled 2020-02-10 (×2): qty 0.5

## 2020-02-10 MED ORDER — PROPOFOL 10 MG/ML IV BOLUS
INTRAVENOUS | Status: AC
  Filled 2020-02-10: qty 20

## 2020-02-10 MED ORDER — DIPHENHYDRAMINE HCL 12.5 MG/5ML PO ELIX: 12.5000 mg | ORAL_SOLUTION | Freq: Four times a day (QID) | ORAL | Status: DC | PRN

## 2020-02-10 MED ORDER — GABAPENTIN 300 MG PO CAPS
300.0000 mg | ORAL_CAPSULE | Freq: Two times a day (BID) | ORAL | Status: DC
  Administered 2020-02-10 – 2020-02-13 (×6): 300 mg via ORAL
  Filled 2020-02-10 (×8): qty 1

## 2020-02-10 MED ORDER — GABAPENTIN 300 MG PO CAPS
300.0000 mg | ORAL_CAPSULE | ORAL | Status: AC
  Administered 2020-02-10: 300 mg via ORAL
  Filled 2020-02-10: qty 1

## 2020-02-10 MED ORDER — PROMETHAZINE HCL 25 MG/ML IJ SOLN: 6.2500 mg | INTRAMUSCULAR | Status: DC | PRN

## 2020-02-10 MED ORDER — HYDROCHLOROTHIAZIDE 25 MG PO TABS
25.0000 mg | ORAL_TABLET | Freq: Every day | ORAL | Status: DC
  Administered 2020-02-10 – 2020-02-14 (×5): 25 mg via ORAL
  Filled 2020-02-10 (×5): qty 1

## 2020-02-10 MED ORDER — DIPHENHYDRAMINE HCL 50 MG/ML IJ SOLN: 12.5000 mg | Freq: Four times a day (QID) | INTRAMUSCULAR | Status: DC | PRN

## 2020-02-10 MED ORDER — LIDOCAINE HCL (CARDIAC) PF 100 MG/5ML IV SOSY
PREFILLED_SYRINGE | INTRAVENOUS | Status: DC | PRN
  Administered 2020-02-10: 50 mg via INTRAVENOUS

## 2020-02-10 MED ORDER — ACETAMINOPHEN 500 MG PO TABS
1000.0000 mg | ORAL_TABLET | Freq: Four times a day (QID) | ORAL | Status: DC
  Administered 2020-02-10 – 2020-02-13 (×10): 1000 mg via ORAL
  Filled 2020-02-10 (×14): qty 2

## 2020-02-10 MED ORDER — FENTANYL CITRATE (PF) 100 MCG/2ML IJ SOLN
INTRAMUSCULAR | Status: AC
  Filled 2020-02-10: qty 2

## 2020-02-10 MED ORDER — ALBUMIN HUMAN 5 % IV SOLN
INTRAVENOUS | Status: AC
  Filled 2020-02-10: qty 250

## 2020-02-10 MED ORDER — AMISULPRIDE (ANTIEMETIC) 5 MG/2ML IV SOLN
INTRAVENOUS | Status: AC
  Administered 2020-02-10: 10 mg via INTRAVENOUS
  Filled 2020-02-10: qty 4

## 2020-02-10 MED ORDER — TIZANIDINE HCL 4 MG PO TABS
4.0000 mg | ORAL_TABLET | Freq: Every day | ORAL | Status: DC
  Administered 2020-02-10 – 2020-02-13 (×4): 4 mg via ORAL
  Filled 2020-02-10 (×4): qty 1

## 2020-02-10 MED ORDER — ROCURONIUM BROMIDE 100 MG/10ML IV SOLN
INTRAVENOUS | Status: DC | PRN
  Administered 2020-02-10: 30 mg via INTRAVENOUS
  Administered 2020-02-10: 10 mg via INTRAVENOUS

## 2020-02-10 MED ORDER — ACETAMINOPHEN 500 MG PO TABS
1000.0000 mg | ORAL_TABLET | ORAL | Status: AC
  Administered 2020-02-10: 1000 mg via ORAL
  Filled 2020-02-10: qty 2

## 2020-02-10 MED ORDER — KCL IN DEXTROSE-NACL 20-5-0.45 MEQ/L-%-% IV SOLN
INTRAVENOUS | Status: DC
  Filled 2020-02-10 (×2): qty 1000

## 2020-02-10 MED ORDER — POTASSIUM CHLORIDE CRYS ER 10 MEQ PO TBCR
10.0000 meq | EXTENDED_RELEASE_TABLET | Freq: Every day | ORAL | Status: DC
  Administered 2020-02-10 – 2020-02-14 (×5): 10 meq via ORAL
  Filled 2020-02-10 (×5): qty 1

## 2020-02-10 MED ORDER — ENSURE SURGERY PO LIQD
237.0000 mL | Freq: Two times a day (BID) | ORAL | Status: DC
  Administered 2020-02-10 – 2020-02-11 (×2): 237 mL via ORAL

## 2020-02-10 MED ORDER — PANTOPRAZOLE SODIUM 40 MG PO TBEC
40.0000 mg | DELAYED_RELEASE_TABLET | Freq: Two times a day (BID) | ORAL | Status: DC
  Administered 2020-02-10 – 2020-02-14 (×9): 40 mg via ORAL
  Filled 2020-02-10 (×9): qty 1

## 2020-02-10 MED ORDER — CHLORHEXIDINE GLUCONATE 0.12 % MT SOLN
15.0000 mL | Freq: Once | OROMUCOSAL | Status: AC
  Administered 2020-02-10: 15 mL via OROMUCOSAL

## 2020-02-10 MED ORDER — SODIUM CHLORIDE 0.9 % IV SOLN
2.0000 g | Freq: Two times a day (BID) | INTRAVENOUS | Status: AC
  Administered 2020-02-10: 2 g via INTRAVENOUS
  Filled 2020-02-10: qty 2

## 2020-02-10 MED ORDER — BUPIVACAINE LIPOSOME 1.3 % IJ SUSP
INTRAMUSCULAR | Status: DC | PRN
  Administered 2020-02-10: 20 mL

## 2020-02-10 MED ORDER — SODIUM CHLORIDE 0.9 % IR SOLN
Status: DC | PRN
  Administered 2020-02-10: 2000 mL

## 2020-02-10 MED ORDER — LACTATED RINGERS IV SOLN: INTRAVENOUS | Status: DC | PRN

## 2020-02-10 MED ORDER — ALVIMOPAN 12 MG PO CAPS
12.0000 mg | ORAL_CAPSULE | ORAL | Status: AC
  Administered 2020-02-10: 12 mg via ORAL
  Filled 2020-02-10: qty 1

## 2020-02-10 MED ORDER — ORAL CARE MOUTH RINSE: 15.0000 mL | Freq: Once | OROMUCOSAL | Status: AC

## 2020-02-10 MED ORDER — LIDOCAINE 20MG/ML (2%) 15 ML SYRINGE OPTIME
INTRAMUSCULAR | Status: DC | PRN
  Administered 2020-02-10: 1.5 mg/kg/h via INTRAVENOUS

## 2020-02-10 MED ORDER — AMLODIPINE BESYLATE 10 MG PO TABS
10.0000 mg | ORAL_TABLET | Freq: Every day | ORAL | Status: DC
  Administered 2020-02-11 – 2020-02-14 (×4): 10 mg via ORAL
  Filled 2020-02-10 (×4): qty 1

## 2020-02-10 MED ORDER — SUGAMMADEX SODIUM 200 MG/2ML IV SOLN
INTRAVENOUS | Status: DC | PRN
  Administered 2020-02-10: 200 mg via INTRAVENOUS

## 2020-02-10 MED ORDER — ONDANSETRON HCL 4 MG/2ML IJ SOLN
INTRAMUSCULAR | Status: DC | PRN
  Administered 2020-02-10: 4 mg via INTRAVENOUS

## 2020-02-10 MED ORDER — FENTANYL CITRATE (PF) 100 MCG/2ML IJ SOLN
INTRAMUSCULAR | Status: DC | PRN
  Administered 2020-02-10: 50 ug via INTRAVENOUS

## 2020-02-10 MED ORDER — FENTANYL CITRATE (PF) 100 MCG/2ML IJ SOLN
25.0000 ug | INTRAMUSCULAR | Status: DC | PRN
  Administered 2020-02-10 (×2): 50 ug via INTRAVENOUS

## 2020-02-10 MED ORDER — ALBUMIN HUMAN 5 % IV SOLN
INTRAVENOUS | Status: DC | PRN
Start: 2020-02-10 — End: 2020-02-10

## 2020-02-10 MED ORDER — LACTATED RINGERS IR SOLN
Status: DC | PRN
  Administered 2020-02-10: 1000 mL

## 2020-02-10 MED ORDER — ENSURE PRE-SURGERY PO LIQD: 296.0000 mL | Freq: Once | ORAL | Status: DC

## 2020-02-10 MED ORDER — PROPOFOL 10 MG/ML IV BOLUS
INTRAVENOUS | Status: DC | PRN
  Administered 2020-02-10: 80 mg via INTRAVENOUS

## 2020-02-10 MED ORDER — ENOXAPARIN SODIUM 30 MG/0.3ML ~~LOC~~ SOLN
30.0000 mg | SUBCUTANEOUS | Status: DC
  Administered 2020-02-11 – 2020-02-14 (×4): 30 mg via SUBCUTANEOUS
  Filled 2020-02-10 (×4): qty 0.3

## 2020-02-10 MED ORDER — DEXAMETHASONE SODIUM PHOSPHATE 10 MG/ML IJ SOLN
INTRAMUSCULAR | Status: DC | PRN
Start: 2020-02-10 — End: 2020-02-10
  Administered 2020-02-10: 8 mg via INTRAVENOUS

## 2020-02-10 MED ORDER — SACCHAROMYCES BOULARDII 250 MG PO CAPS
250.0000 mg | ORAL_CAPSULE | Freq: Two times a day (BID) | ORAL | Status: DC
  Administered 2020-02-10 – 2020-02-14 (×9): 250 mg via ORAL
  Filled 2020-02-10 (×9): qty 1

## 2020-02-10 MED ORDER — ALVIMOPAN 12 MG PO CAPS
12.0000 mg | ORAL_CAPSULE | Freq: Two times a day (BID) | ORAL | Status: DC
  Administered 2020-02-11 (×2): 12 mg via ORAL
  Filled 2020-02-10 (×2): qty 1

## 2020-02-10 MED ORDER — ONDANSETRON HCL 4 MG/2ML IJ SOLN: 4.0000 mg | Freq: Four times a day (QID) | INTRAMUSCULAR | Status: DC | PRN

## 2020-02-10 MED ORDER — LACTATED RINGERS IV SOLN: INTRAVENOUS | Status: DC

## 2020-02-10 MED ORDER — BUPIVACAINE-EPINEPHRINE (PF) 0.25% -1:200000 IJ SOLN
INTRAMUSCULAR | Status: AC
  Filled 2020-02-10: qty 30

## 2020-02-10 MED ORDER — PHENYLEPHRINE HCL (PRESSORS) 10 MG/ML IV SOLN
INTRAVENOUS | Status: AC
  Filled 2020-02-10: qty 1

## 2020-02-10 MED ORDER — ALUM & MAG HYDROXIDE-SIMETH 200-200-20 MG/5ML PO SUSP: 30.0000 mL | Freq: Four times a day (QID) | ORAL | Status: DC | PRN

## 2020-02-10 MED ORDER — PROPOFOL 1000 MG/100ML IV EMUL
INTRAVENOUS | Status: AC
  Filled 2020-02-10: qty 100

## 2020-02-10 SURGICAL SUPPLY — 68 items
ADH SKN CLS APL DERMABOND .7 (GAUZE/BANDAGES/DRESSINGS) ×1
APL PRP STRL LF DISP 70% ISPRP (MISCELLANEOUS) ×1
APPLIER CLIP 5 13 M/L LIGAMAX5 (MISCELLANEOUS)
APR CLP MED LRG 5 ANG JAW (MISCELLANEOUS)
BLADE EXTENDED COATED 6.5IN (ELECTRODE) IMPLANT
CABLE HIGH FREQUENCY MONO STRZ (ELECTRODE) IMPLANT
CELLS DAT CNTRL 66122 CELL SVR (MISCELLANEOUS) IMPLANT
CHLORAPREP W/TINT 26 (MISCELLANEOUS) ×2 IMPLANT
CLIP APPLIE 5 13 M/L LIGAMAX5 (MISCELLANEOUS) IMPLANT
COVER WAND RF STERILE (DRAPES) ×2 IMPLANT
DECANTER SPIKE VIAL GLASS SM (MISCELLANEOUS) ×2 IMPLANT
DERMABOND ADVANCED (GAUZE/BANDAGES/DRESSINGS) ×1
DERMABOND ADVANCED .7 DNX12 (GAUZE/BANDAGES/DRESSINGS) ×1 IMPLANT
DRAIN CHANNEL 19F RND (DRAIN) IMPLANT
DRAPE LAPAROSCOPIC ABDOMINAL (DRAPES) ×2 IMPLANT
DRAPE SURG IRRIG POUCH 19X23 (DRAPES) ×2 IMPLANT
DRSG OPSITE POSTOP 4X10 (GAUZE/BANDAGES/DRESSINGS) IMPLANT
DRSG OPSITE POSTOP 4X6 (GAUZE/BANDAGES/DRESSINGS) IMPLANT
DRSG OPSITE POSTOP 4X8 (GAUZE/BANDAGES/DRESSINGS) IMPLANT
ELECT REM PT RETURN 15FT ADLT (MISCELLANEOUS) ×2 IMPLANT
EVACUATOR SILICONE 100CC (DRAIN) IMPLANT
GAUZE SPONGE 4X4 12PLY STRL (GAUZE/BANDAGES/DRESSINGS) IMPLANT
GLOVE BIO SURGEON STRL SZ 6.5 (GLOVE) ×4 IMPLANT
GLOVE BIOGEL PI IND STRL 7.0 (GLOVE) ×2 IMPLANT
GLOVE BIOGEL PI INDICATOR 7.0 (GLOVE) ×2
GOWN STRL REUS W/TWL XL LVL3 (GOWN DISPOSABLE) ×12 IMPLANT
GRASPER ENDOPATH ANVIL 10MM (MISCELLANEOUS) IMPLANT
HOLDER FOLEY CATH W/STRAP (MISCELLANEOUS) ×2 IMPLANT
IRRIG SUCT STRYKERFLOW 2 WTIP (MISCELLANEOUS) ×2
IRRIGATION SUCT STRKRFLW 2 WTP (MISCELLANEOUS) ×1 IMPLANT
KIT TURNOVER KIT A (KITS) IMPLANT
PACK COLON (CUSTOM PROCEDURE TRAY) ×2 IMPLANT
PAD POSITIONING PINK XL (MISCELLANEOUS) ×2 IMPLANT
PENCIL SMOKE EVACUATOR (MISCELLANEOUS) IMPLANT
PORT LAP GEL ALEXIS MED 5-9CM (MISCELLANEOUS) ×2 IMPLANT
PROTECTOR NERVE ULNAR (MISCELLANEOUS) ×1 IMPLANT
RELOAD PROXIMATE 75MM BLUE (ENDOMECHANICALS) ×4 IMPLANT
RELOAD STAPLE 75 3.8 BLU REG (ENDOMECHANICALS) IMPLANT
RETRACTOR WND ALEXIS 18 MED (MISCELLANEOUS) IMPLANT
RTRCTR WOUND ALEXIS 18CM MED (MISCELLANEOUS)
SCISSORS LAP 5X35 DISP (ENDOMECHANICALS) ×2 IMPLANT
SEALER TISSUE G2 STRG ARTC 35C (ENDOMECHANICALS) ×2 IMPLANT
SET TUBE SMOKE EVAC HIGH FLOW (TUBING) ×2 IMPLANT
SLEEVE XCEL OPT CAN 5 100 (ENDOMECHANICALS) ×2 IMPLANT
SPONGE DRAIN TRACH 4X4 STRL 2S (GAUZE/BANDAGES/DRESSINGS) IMPLANT
SPONGE LAP 18X18 RF (DISPOSABLE) ×1 IMPLANT
STAPLER GUN LINEAR PROX 60 (STAPLE) ×1 IMPLANT
STAPLER PROXIMATE 75MM BLUE (STAPLE) ×1 IMPLANT
SURGILUBE 2OZ TUBE FLIPTOP (MISCELLANEOUS) ×1 IMPLANT
SUT ETHILON 2 0 PS N (SUTURE) IMPLANT
SUT NOVA NAB DX-16 0-1 5-0 T12 (SUTURE) ×4 IMPLANT
SUT PROLENE 2 0 KS (SUTURE) IMPLANT
SUT SILK 2 0 (SUTURE) ×2
SUT SILK 2 0 SH CR/8 (SUTURE) IMPLANT
SUT SILK 2-0 18XBRD TIE 12 (SUTURE) ×1 IMPLANT
SUT SILK 3 0 (SUTURE)
SUT SILK 3 0 SH CR/8 (SUTURE) ×3 IMPLANT
SUT SILK 3-0 18XBRD TIE 12 (SUTURE) IMPLANT
SUT VIC AB 2-0 SH 18 (SUTURE) ×2 IMPLANT
SUT VIC AB 4-0 PS2 27 (SUTURE) ×2 IMPLANT
SUT VICRYL 0 UR6 27IN ABS (SUTURE) ×2 IMPLANT
SYS LAPSCP GELPORT 120MM (MISCELLANEOUS)
SYSTEM LAPSCP GELPORT 120MM (MISCELLANEOUS) IMPLANT
TOWEL OR NON WOVEN STRL DISP B (DISPOSABLE) ×2 IMPLANT
TRAY FOLEY MTR SLVR 16FR STAT (SET/KITS/TRAYS/PACK) ×1 IMPLANT
TROCAR BLADELESS OPT 5 100 (ENDOMECHANICALS) ×2 IMPLANT
TROCAR XCEL BLUNT TIP 100MML (ENDOMECHANICALS) IMPLANT
TUBING CONNECTING 10 (TUBING) ×3 IMPLANT

## 2020-02-10 NOTE — Anesthesia Procedure Notes (Signed)
Procedure Name: Intubation Date/Time: 02/10/2020 7:43 AM Performed by: Glory Buff, CRNA Pre-anesthesia Checklist: Patient identified, Emergency Drugs available, Suction available and Patient being monitored Patient Re-evaluated:Patient Re-evaluated prior to induction Oxygen Delivery Method: Circle system utilized Preoxygenation: Pre-oxygenation with 100% oxygen Induction Type: IV induction Ventilation: Mask ventilation without difficulty Laryngoscope Size: Miller and 3 Grade View: Grade II Tube type: Oral Tube size: 7.0 mm Number of attempts: 1 Airway Equipment and Method: Stylet and Oral airway Placement Confirmation: ETT inserted through vocal cords under direct vision,  positive ETCO2 and breath sounds checked- equal and bilateral Secured at: 20 cm Tube secured with: Tape Dental Injury: Teeth and Oropharynx as per pre-operative assessment

## 2020-02-10 NOTE — Transfer of Care (Signed)
Immediate Anesthesia Transfer of Care Note  Patient: Amber Park  Procedure(s) Performed: LAPAROSCOPIC PARTIAL COLECTOMY (N/A Abdomen)  Patient Location: PACU  Anesthesia Type:General  Level of Consciousness: drowsy, patient cooperative and responds to stimulation  Airway & Oxygen Therapy: Patient Spontanous Breathing and Patient connected to face mask oxygen  Post-op Assessment: Report given to RN and Post -op Vital signs reviewed and stable  Post vital signs: Reviewed and stable  Last Vitals:  Vitals Value Taken Time  BP 108/67 02/10/20 0930  Temp    Pulse 89 02/10/20 0934  Resp 17 02/10/20 0934  SpO2 100 % 02/10/20 0934  Vitals shown include unvalidated device data.  Last Pain:  Vitals:   02/10/20 0550  TempSrc: Oral      Patients Stated Pain Goal: 4 (91/79/21 7837)  Complications: No complications documented.

## 2020-02-10 NOTE — Op Note (Signed)
02/10/2020  9:16 AM  PATIENT:  Amber Park  86 y.o. female  Patient Care Team: Lajean Manes, MD as PCP - General (Internal Medicine) Jonnie Finner, RN as Oncology Nurse Navigator Ladell Pier, MD as Consulting Physician (Oncology)  PRE-OPERATIVE DIAGNOSIS:  colon cancer  POST-OPERATIVE DIAGNOSIS:  colon cancer  PROCEDURE: LAPAROSCOPIC RIGHT COLECTOMY   Surgeon(s): Leighton Ruff, MD Carlena Hurl, PA-C  ASSISTANT: Carlena Hurl, PA   ANESTHESIA:   local and general  EBL: 20 ml  Total I/O In: 350 [IV Piggyback:350] Out: 7 [Urine:20; Blood:20]  SPECIMEN:  Source of Specimen:  R colon  DISPOSITION OF SPECIMEN:  PATHOLOGY  COUNTS:  YES  PLAN OF CARE: Admit to inpatient   PATIENT DISPOSITION:  PACU - hemodynamically stable.   INDICATIONS: This is a 86 y.o. female who presented to my office with an ascending colon cancer. The risk and benefits and alternative treatments were explained to the patient prior to the OR and the patient has elected to proceed with laparoscopic right colectomy.  Consent was signed and placed on chart prior to the OR.   OR FINDINGS: Cecal mass with tattoo  DESCRIPTION:  The patient was identified & brought into the operating room. The patient was positioned supine with arm tucked. SCDs were active during the entire case. The patient underwent general anesthesia without any difficulty. A foley catheter was inserted under sterile conditions. The abdomen was prepped and draped in a sterile fashion. A Surgical Timeout confirmed our plan.  I made an incision around the umbilical fold. I dissected down through the subcutaneous tissues using cautery.  The fascia was divided with cautery.  Blunt dissection was used to obtain peritoneal entry.  An alexis wound protector was placed and the cap was placed over this.  The abdomen was insufflated to ~15 mmHg.  Camera inspection revealed no injury.  I placed additional ports under direct laparoscopic  visualization.  I evaluated the entire abdomen laparoscopically.  The liver appeared normal, the large and small bowel were normal as well.  There were no signs of metastatic disease.   I began by identifying the ileocolic artery and vein within the mesentery. Dissection was bluntly carried around these structures. The duodenum was identified and free from the structures. I then separated the structures bluntly and used the Enseal device to transect these separately.  I developed the retroperitoneal plane bluntly.  I then freed the appendix off its attachments to the pelvic wall. I mobilized the terminal ileum.  I took care to avoid injuring any retroperitoneal structures.  After this I began to mobilize laterally down the white line of T[place] and then took down the hepatic flexure using the Enseal device. I mobilized the omentum off of the right transverse colon. The entire colon was then flipped medially and mobilized off of the retroperitoneal structures until I could visualize the lateral edge of the duodenum underneath.  I gently freed the duodenal attachments.  At that point, I desufflated the abdomen and removed the wound protector cap.  The terminal ileum and right colon were then removed from the wound. The terminal ileum was transected using a GIA blue load stapler. The remaining mesentery was divided using the Enseal device. I identified a portion of the transverse colon just distal to the hepatic flexure. There was a good pulse at the mesentery at this point.  This was transected using another blue load GIA stapler.  An anastomosis was created between the terminal ileum and the transverse  colon. This was done using a GIA blue load stapler.  The common enterotomy channel was closed using a TA 60 blue load stapler. Hemostasis was good at the staple line. Several 3-0 silk sutures were used to imbricate the edge of the anastomosis. An anti-tension suture was placed in the crotch of the anastomosis. This  was then placed back into the abdomen. The abdomen was then irrigated with normal saline and inspected.  Hemostasis was good.  There was no sign of injury to any surrounding structures.  The omentum was then brought down over the anastomosis. The Alexis wound protector was removed, and we switched to clean instruments, gowns and drapes.  The fascia was then closed using #1 Novafil interrupted sutures.  The subcutaneous tissue of the extraction incision was closed using interrupted 2-0 Vicryl sutures. The skin was then closed using 4-0 Vicryl sutures. Dermabond was placed on the port sites and a sterile dressing was placed over the abdominal incision.  All counts were correct per operating room staff. The patient was then awakened from anesthesia and sent to the post anesthesia care unit in stable condition.

## 2020-02-10 NOTE — Interval H&P Note (Signed)
Pt with no medical changes.  Ready for Lap partial colectomy.   Pt examined.   Vitals reviewed.  Amber Park

## 2020-02-11 ENCOUNTER — Encounter (HOSPITAL_COMMUNITY): Payer: Self-pay | Admitting: General Surgery

## 2020-02-11 LAB — CBC
HCT: 25.9 % — ABNORMAL LOW (ref 36.0–46.0)
Hemoglobin: 8.2 g/dL — ABNORMAL LOW (ref 12.0–15.0)
MCH: 24.8 pg — ABNORMAL LOW (ref 26.0–34.0)
MCHC: 31.7 g/dL (ref 30.0–36.0)
MCV: 78.5 fL — ABNORMAL LOW (ref 80.0–100.0)
Platelets: 333 10*3/uL (ref 150–400)
RBC: 3.3 MIL/uL — ABNORMAL LOW (ref 3.87–5.11)
RDW: 14.3 % (ref 11.5–15.5)
WBC: 13.6 10*3/uL — ABNORMAL HIGH (ref 4.0–10.5)
nRBC: 0 % (ref 0.0–0.2)

## 2020-02-11 LAB — BASIC METABOLIC PANEL
Anion gap: 9 (ref 5–15)
BUN: 11 mg/dL (ref 8–23)
CO2: 25 mmol/L (ref 22–32)
Calcium: 8.5 mg/dL — ABNORMAL LOW (ref 8.9–10.3)
Chloride: 93 mmol/L — ABNORMAL LOW (ref 98–111)
Creatinine, Ser: 1.01 mg/dL — ABNORMAL HIGH (ref 0.44–1.00)
GFR calc Af Amer: 56 mL/min — ABNORMAL LOW (ref >60)
GFR calc non Af Amer: 48 mL/min — ABNORMAL LOW (ref >60)
Glucose, Bld: 132 mg/dL — ABNORMAL HIGH (ref 70–99)
Potassium: 3.2 mmol/L — ABNORMAL LOW (ref 3.5–5.1)
Sodium: 127 mmol/L — ABNORMAL LOW (ref 135–145)

## 2020-02-11 NOTE — Progress Notes (Signed)
1 Day Post-Op Lap R colectomy Subjective: Pain controlled, passing some flatus, no BM.  Min nausea  Objective: Vital signs in last 24 hours: Temp:  [97.1 F (36.2 C)-98.7 F (37.1 C)] 98.7 F (37.1 C) (07/23 0702) Pulse Rate:  [61-92] 64 (07/23 0702) Resp:  [14-18] 16 (07/23 0702) BP: (108-144)/(47-67) 108/50 (07/23 0702) SpO2:  [94 %-100 %] 95 % (07/23 0702)   Intake/Output from previous day: 07/22 0701 - 07/23 0700 In: 3450.2 [P.O.:580; I.V.:2420.2; IV Piggyback:450] Out: 5784 [Urine:1570; Blood:20] Intake/Output this shift: No intake/output data recorded.   General appearance: alert and cooperative GI: normal findings: soft, moderately distended  Incision: no significant drainage  Lab Results:  Recent Labs    02/11/20 0458  WBC 13.6*  HGB 8.2*  HCT 25.9*  PLT 333   BMET Recent Labs    02/11/20 0458  NA 127*  K 3.2*  CL 93*  CO2 25  GLUCOSE 132*  BUN 11  CREATININE 1.01*  CALCIUM 8.5*   PT/INR No results for input(s): LABPROT, INR in the last 72 hours. ABG No results for input(s): PHART, HCO3 in the last 72 hours.  Invalid input(s): PCO2, PO2  MEDS, Scheduled . acetaminophen  1,000 mg Oral Q6H  . alvimopan  12 mg Oral BID  . amLODipine  10 mg Oral Daily  . enoxaparin (LOVENOX) injection  30 mg Subcutaneous Q24H  . feeding supplement  237 mL Oral BID BM  . gabapentin  300 mg Oral BID  . hydrochlorothiazide  25 mg Oral Daily  . latanoprost  1 drop Both Eyes QHS  . pantoprazole  40 mg Oral BID AC  . potassium chloride  10 mEq Oral Daily  . saccharomyces boulardii  250 mg Oral BID  . tiZANidine  4 mg Oral QHS    Studies/Results: No results found.  Assessment: s/p Procedure(s): LAPAROSCOPIC PARTIAL COLECTOMY Patient Active Problem List   Diagnosis Date Noted  . Colon cancer, ascending ([place]) 02/10/2020  . Epigastric pain 09/22/2019  . Loss of weight 07/22/2019  . 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  Abdominal bloating 01/16/2019  . AVM (arteriovenous malformation) of colon 01/16/2019  . Lower abdominal pain 01/16/2019  . 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 Hiatal hernia 01/16/2019  . Symptomatic anemia 09/14/2018  . Overweight (BMI 25.0-29.9) 06/01/2014  . 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 ANEMIA, IRON DEFICIENCY 05/02/2009  . GERD 05/02/2009  . PERSONAL HX COLONIC POLYPS 05/02/2009    Expected post op course  Plan: d/c foley SL IVF's  Monitor UOP Ambulate in hall Cont fulls as tolerated   LOS: 1 day     .Rosario Adie, MD [facility] Surgery, [place]    02/11/2020 7:58 AM

## 2020-02-11 NOTE — Progress Notes (Signed)
Received call from patient's daughter Frederich Cha to let us know that she had her surgery yesterday by Dr. Marcello Moores.  She is still in [facility] hospital room 1311.  I explained we do not have her surgical pathology back yet that it usually takes a few days.  She has a follow up with Dr. Benay Spice on 8/17 at 3 pm to discuss further treatment.   Dr. Benay Spice was made aware.

## 2020-02-11 NOTE — Progress Notes (Signed)
Physical Therapy Evaluation Patient Details Name: Amber Park MRN: [id number] DOB: 1924/12/25 Today's Date: 02/11/2020    Clinical Impression Amber Park is 86 y.o. female admitted with above HPI and diagnosis. Patient is currently limited by functional impairments below (see PT problem list). Patient lives at [place] and is independent with Rollator for mobility at baseline. Patient now requires min assist for transfers and gait with RW and is limited by fatigue/weakness for distance. Patient will benefit from continued skilled PT interventions to address impairments and progress independence with mobility, recommending ST at SNF. Acute PT will follow and progress as able.    02/11/20 0820  PT Visit Information  Last PT Received On 02/11/20  Assistance Needed +1  History of Present Illness The patient is a 86 year old female who presents with colorectal cancer. She underwent a colonoscopy January 03, 2020.  Mass was noted in the cecum and tattooed.  Biopsy showed adenocarcinoma.  CT scan shows thickened central wall thickening of the cecum and terminal ileum with some mildly enlarged mesenteric lymph nodes.  There are multiple small small pulmonary nodules.  These are nonspecific.  There is a large hiatal hernia. Patient is in assisted living, but handles all of her daily activities by herself.  Precautions  Precautions Fall  Restrictions  Weight Bearing Restrictions No  Home Living  Family/patient expects to be discharged to: Private residence ([place] [facility]  Living Arrangements Alone  Available Help at Discharge Family  Type of [place] Access Level entry  Home Layout One level  Bathroom Shower/Tub Walk-in shower  [place] - 4 wheels;Grab bars - tub/shower  Additional Comments pt goes to the dining room for dinner and make small breakfast and sandwhich at lunch in her  apartment.  Prior Function  Level of Independence Independent with assistive device(s)  Comments pt has been using Rollator for mobility since her colonoscopy in June. She is independent with ADL's.  Communication  Communication HOH  Pain Assessment  Pain Assessment No/denies pain  Cognition  Arousal/Alertness Awake/alert  Behavior During Therapy WFL for tasks assessed/performed  Overall Cognitive Status Within Functional Limits for tasks assessed  Upper Extremity Assessment  Upper Extremity Assessment Generalized weakness  Lower Extremity Assessment  Lower Extremity Assessment Generalized weakness  Cervical / Trunk Assessment  Cervical / Trunk Assessment Kyphotic  Bed Mobility  Overal bed mobility Modified Independent  General bed mobility comments HOB elevated, no assist required to sit up to EOB.  Transfers  Overall transfer level Needs assistance  Equipment used Rolling walker (2 wheeled)  Transfers Sit to/from [facility]  Sit to Stand Min guard  Stand pivot transfers Min assist  General transfer comment VCs for technique with RW. pt using bil UE on RW to rise despite cues. pt steady in standing. complete stand step transfer to recliner and then agreed to ambulate greater distance. VC required to maintain safe position to walker with stand step transfer.  Ambulation/Gait  Ambulation/Gait assistance Min assist  Gait Distance (Feet) 50 Feet  Assistive device Rolling walker (2 wheeled)  Gait Pattern/deviations Step-through pattern;Decreased step length - right;Decreased step length - left;Decreased stride length;Trunk flexed;Narrow base of support  General Gait Details VC's for safe proximity to RW and assist requried throughout to maintain. no overt LOB and VSS with mobility. pt c/o LE fatigue.  Gait velocity decr  Balance  Overall balance assessment Needs assistance  Sitting-balance support  Feet supported  Sitting balance-Leahy Scale Fair  Standing balance  support During functional activity;Bilateral upper extremity supported  Standing balance-Leahy Scale Poor  PT - End of Session  Equipment Utilized During Treatment Gait belt  Activity Tolerance Patient tolerated treatment well  Patient left in chair;with call bell/phone within reach;with chair alarm set  Nurse Communication Mobility status  PT Assessment  PT Recommendation/Assessment Patient needs continued PT services  PT Visit Diagnosis Unsteadiness on feet (R26.81);Muscle weakness (generalized) (M62.81);Difficulty in walking, not elsewhere classified (R26.2)  PT Problem List Decreased strength;Decreased balance;Decreased activity tolerance;Decreased mobility;Decreased knowledge of use of DME;Decreased safety awareness  PT Plan  PT Frequency (ACUTE ONLY) Min 3X/week  PT Treatment/Interventions (ACUTE ONLY) DME instruction;Gait training;Stair training;Functional mobility training;Therapeutic activities;Therapeutic exercise;Balance training;Patient/family education  AM-PAC PT "6 Clicks" Mobility Outcome Measure (Version 2)  Help needed turning from your back to your side while in a flat bed without using bedrails? 4  Help needed moving from lying on your back to sitting on the side of a flat bed without using bedrails? 4  Help needed moving to and from a bed to a chair (including a wheelchair)? 3  Help needed standing up from a chair using your arms (e.g., wheelchair or bedside chair)? 3  Help needed to walk in hospital room? 3  Help needed climbing 3-5 steps with a railing?  2  6 Click Score 19  Consider Recommendation of Discharge To: [facility] with [facility]  PT Recommendation  Follow Up Recommendations SNF;Other (comment) (SNF vs HHPT at ALF if pt progresses in acute setting)  PT equipment None recommended by PT  Individuals Consulted  Consulted and Agree with Results and Recommendations Patient  Acute Rehab PT Goals  Patient Stated Goal get back to ILF  PT Goal Formulation With patient  Time  For Goal Achievement 02/25/20  Potential to Achieve Goals Good  PT Time Calculation  PT Start Time (ACUTE ONLY) 0857  PT Stop Time (ACUTE ONLY) 0932  PT Time Calculation (min) (ACUTE ONLY) 35 min  PT General Charges  $$ ACUTE PT VISIT 1 Visit  PT Evaluation  $PT Eval Low Complexity 1 Low  PT Treatments  $Gait Training 8-22 mins  Written Expression  Dominant Hand Right    Verner Mould, DPT Acute Rehabilitation Services  Office [phone] Pager [phone]  02/11/2020 2:17 PM

## 2020-02-12 LAB — CBC
HCT: 27.8 % — ABNORMAL LOW (ref 36.0–46.0)
Hemoglobin: 8.8 g/dL — ABNORMAL LOW (ref 12.0–15.0)
MCH: 24.6 pg — ABNORMAL LOW (ref 26.0–34.0)
MCHC: 31.7 g/dL (ref 30.0–36.0)
MCV: 77.9 fL — ABNORMAL LOW (ref 80.0–100.0)
Platelets: 366 10*3/uL (ref 150–400)
RBC: 3.57 MIL/uL — ABNORMAL LOW (ref 3.87–5.11)
RDW: 14.6 % (ref 11.5–15.5)
WBC: 14.4 10*3/uL — ABNORMAL HIGH (ref 4.0–10.5)
nRBC: 0 % (ref 0.0–0.2)

## 2020-02-12 LAB — BASIC METABOLIC PANEL
Anion gap: 8 (ref 5–15)
BUN: 12 mg/dL (ref 8–23)
CO2: 30 mmol/L (ref 22–32)
Calcium: 8.9 mg/dL (ref 8.9–10.3)
Chloride: 90 mmol/L — ABNORMAL LOW (ref 98–111)
Creatinine, Ser: 0.95 mg/dL (ref 0.44–1.00)
GFR calc Af Amer: >60 mL/min (ref >60)
GFR calc non Af Amer: 52 mL/min — ABNORMAL LOW (ref >60)
Glucose, Bld: 89 mg/dL (ref 70–99)
Potassium: 3.1 mmol/L — ABNORMAL LOW (ref 3.5–5.1)
Sodium: 128 mmol/L — ABNORMAL LOW (ref 135–145)

## 2020-02-12 NOTE — Progress Notes (Signed)
2 Days Post-Op Lap R colectomy Subjective: Pain controlled, passing some flatus, and having BM's.  Min nausea  Objective: Vital signs in last 24 hours: Temp:  [97.3 F (36.3 C)-98.8 F (37.1 C)] 97.3 F (36.3 C) (07/24 0519) Pulse Rate:  [71-73] 71 (07/24 0519) Resp:  [18] 18 (07/24 0519) BP: (125-135)/(49-54) 125/54 (07/24 0519) SpO2:  [93 %-96 %] 93 % (07/24 0519)   Intake/Output from previous day: 07/23 0701 - 07/24 0700 In: 890 [P.O.:890] Out: 1425 [Urine:1425] Intake/Output this shift: Total I/O In: 60 [P.O.:60] Out: 150 [Urine:150]   General appearance: alert and cooperative GI: normal findings: soft, moderately distended  Incision: no significant drainage  Lab Results:  Recent Labs    02/11/20 0458 02/12/20 0632  WBC 13.6* 14.4*  HGB 8.2* 8.8*  HCT 25.9* 27.8*  PLT 333 366   BMET Recent Labs    02/11/20 0458 02/12/20 0632  NA 127* 128*  K 3.2* 3.1*  CL 93* 90*  CO2 25 30  GLUCOSE 132* 89  BUN 11 12  CREATININE 1.01* 0.95  CALCIUM 8.5* 8.9   PT/INR No results for input(s): LABPROT, INR in the last 72 hours. ABG No results for input(s): PHART, HCO3 in the last 72 hours.  Invalid input(s): PCO2, PO2  MEDS, Scheduled . acetaminophen  1,000 mg Oral Q6H  . alvimopan  12 mg Oral BID  . amLODipine  10 mg Oral Daily  . enoxaparin (LOVENOX) injection  30 mg Subcutaneous Q24H  . feeding supplement  237 mL Oral BID BM  . gabapentin  300 mg Oral BID  . hydrochlorothiazide  25 mg Oral Daily  . latanoprost  1 drop Both Eyes QHS  . pantoprazole  40 mg Oral BID AC  . potassium chloride  10 mEq Oral Daily  . saccharomyces boulardii  250 mg Oral BID  . tiZANidine  4 mg Oral QHS    Studies/Results: No results found.  Assessment: s/p Procedure(s): LAPAROSCOPIC PARTIAL COLECTOMY Patient Active Problem List   Diagnosis Date Noted  . Colon cancer, ascending ([place]) 02/10/2020  . Epigastric pain 09/22/2019  . Loss of weight 07/22/2019  . AVM  (arteriovenous malformation) of stomach, acquired 07/22/2019  . Abdominal bloating 01/16/2019  . AVM (arteriovenous malformation) of colon 01/16/2019  . Lower abdominal pain 01/16/2019  . 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 Hiatal hernia 01/16/2019  . Symptomatic anemia 09/14/2018  . Overweight (BMI 25.0-29.9) 06/01/2014  . 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 ANEMIA, IRON DEFICIENCY 05/02/2009  . GERD 05/02/2009  . PERSONAL HX COLONIC POLYPS 05/02/2009    Expected post op course  Plan: SL IVF's  Monitor UOP Ambulate in hall Advance to solid foods   LOS: 2 days     .Rosario Adie, MD [facility] Surgery, [place]    02/12/2020 9:51 AM

## 2020-02-12 NOTE — Progress Notes (Signed)
Pharmacy Brief Note - Alvimopan (Entereg)  The standing order set for alvimopan (Entereg) now includes an automatic order to discontinue the drug after the patient has had a bowel movement. The change was approved by the [place] and the Medical Executive Committee.   This patient has had bowel movements documented by nursing. Therefore, alvimopan has been discontinued. If there are questions, please contact the pharmacy at [phone].   Thank you- Dolly Rias RPh 02/12/2020, 10:35 AM

## 2020-02-12 NOTE — Progress Notes (Signed)
2 Days Post-Op Lap R colectomy Subjective: Pain controlled, passing some flatus, and having BM's.  Min nausea.  Having some dizziness upon standing.  BP stable  Objective: Vital signs in last 24 hours: Temp:  [97.3 F (36.3 C)-98.8 F (37.1 C)] 97.3 F (36.3 C) (07/24 0519) Pulse Rate:  [71-73] 71 (07/24 0519) Resp:  [18] 18 (07/24 0519) BP: (125-135)/(49-54) 125/54 (07/24 0519) SpO2:  [93 %-96 %] 93 % (07/24 0519)   Intake/Output from previous day: 07/23 0701 - 07/24 0700 In: 890 [P.O.:890] Out: 1425 [Urine:1425] Intake/Output this shift: Total I/O In: 60 [P.O.:60] Out: 150 [Urine:150]   General appearance: alert and cooperative GI: normal findings: soft, moderately distended  Incision: no significant drainage  Lab Results:  Recent Labs    02/11/20 0458 02/12/20 0632  WBC 13.6* 14.4*  HGB 8.2* 8.8*  HCT 25.9* 27.8*  PLT 333 366   BMET Recent Labs    02/11/20 0458 02/12/20 0632  NA 127* 128*  K 3.2* 3.1*  CL 93* 90*  CO2 25 30  GLUCOSE 132* 89  BUN 11 12  CREATININE 1.01* 0.95  CALCIUM 8.5* 8.9   PT/INR No results for input(s): LABPROT, INR in the last 72 hours. ABG No results for input(s): PHART, HCO3 in the last 72 hours.  Invalid input(s): PCO2, PO2  MEDS, Scheduled . acetaminophen  1,000 mg Oral Q6H  . alvimopan  12 mg Oral BID  . amLODipine  10 mg Oral Daily  . enoxaparin (LOVENOX) injection  30 mg Subcutaneous Q24H  . feeding supplement  237 mL Oral BID BM  . gabapentin  300 mg Oral BID  . hydrochlorothiazide  25 mg Oral Daily  . latanoprost  1 drop Both Eyes QHS  . pantoprazole  40 mg Oral BID AC  . potassium chloride  10 mEq Oral Daily  . saccharomyces boulardii  250 mg Oral BID  . tiZANidine  4 mg Oral QHS    Studies/Results: No results found.  Assessment: s/p Procedure(s): LAPAROSCOPIC PARTIAL COLECTOMY Patient Active Problem List   Diagnosis Date Noted  . Colon cancer, ascending ([place]) 02/10/2020  . Epigastric pain  09/22/2019  . Loss of weight 07/22/2019  . 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 Abdominal bloating 01/16/2019  . AVM (arteriovenous malformation) of colon 01/16/2019  . Lower abdominal pain 01/16/2019  . 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 Hiatal hernia 01/16/2019  . Symptomatic anemia 09/14/2018  . Overweight (BMI 25.0-29.9) 06/01/2014  . 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 ANEMIA, IRON DEFICIENCY 05/02/2009  . GERD 05/02/2009  . PERSONAL HX COLONIC POLYPS 05/02/2009    Expected post op course  Plan: SL IVF's  Monitor UOP Ambulate in hall Advance to solid foods   LOS: 2 days     .Rosario Adie, MD [facility] Surgery, [place]    02/12/2020 9:52 AM

## 2020-02-13 LAB — CBC
HCT: 30.9 % — ABNORMAL LOW (ref 36.0–46.0)
Hemoglobin: 9.9 g/dL — ABNORMAL LOW (ref 12.0–15.0)
MCH: 24.7 pg — ABNORMAL LOW (ref 26.0–34.0)
MCHC: 32 g/dL (ref 30.0–36.0)
MCV: 77.1 fL — ABNORMAL LOW (ref 80.0–100.0)
Platelets: 398 10*3/uL (ref 150–400)
RBC: 4.01 MIL/uL (ref 3.87–5.11)
RDW: 14.6 % (ref 11.5–15.5)
WBC: 11 10*3/uL — ABNORMAL HIGH (ref 4.0–10.5)
nRBC: 0 % (ref 0.0–0.2)

## 2020-02-13 LAB — BASIC METABOLIC PANEL
Anion gap: 9 (ref 5–15)
BUN: 15 mg/dL (ref 8–23)
CO2: 31 mmol/L (ref 22–32)
Calcium: 9.1 mg/dL (ref 8.9–10.3)
Chloride: 91 mmol/L — ABNORMAL LOW (ref 98–111)
Creatinine, Ser: 0.96 mg/dL (ref 0.44–1.00)
GFR calc Af Amer: 60 mL/min — ABNORMAL LOW (ref >60)
GFR calc non Af Amer: 51 mL/min — ABNORMAL LOW (ref >60)
Glucose, Bld: 95 mg/dL (ref 70–99)
Potassium: 3.2 mmol/L — ABNORMAL LOW (ref 3.5–5.1)
Sodium: 131 mmol/L — ABNORMAL LOW (ref 135–145)

## 2020-02-13 NOTE — Progress Notes (Signed)
3 Days Post-Op Lap R colectomy Subjective: Pain controlled, passing some flatus, and having BM's.  No nausea.  Having less dizziness upon standing.  Unable to get out of bed without assistance  Objective: Vital signs in last 24 hours: Temp:  [97.6 F (36.4 C)-98.1 F (36.7 C)] 97.6 F (36.4 C) (07/25 0610) Pulse Rate:  [76-91] 76 (07/25 0610) Resp:  [14-16] 14 (07/25 0610) BP: (144-153)/(55-56) 144/55 (07/25 0610) SpO2:  [94 %-97 %] 95 % (07/25 0610)   Intake/Output from previous day: 07/24 0701 - 07/25 0700 In: 120 [P.O.:120] Out: 2350 [Urine:2350] Intake/Output this shift: No intake/output data recorded.   General appearance: alert and cooperative GI: normal findings: soft, moderately distended  Incision: no significant drainage  Lab Results:  Recent Labs    02/12/20 0632 02/13/20 0603  WBC 14.4* 11.0*  HGB 8.8* 9.9*  HCT 27.8* 30.9*  PLT 366 398   BMET Recent Labs    02/12/20 0632 02/13/20 0603  NA 128* 131*  K 3.1* 3.2*  CL 90* 91*  CO2 30 31  GLUCOSE 89 95  BUN 12 15  CREATININE 0.95 0.96  CALCIUM 8.9 9.1   PT/INR No results for input(s): LABPROT, INR in the last 72 hours. ABG No results for input(s): PHART, HCO3 in the last 72 hours.  Invalid input(s): PCO2, PO2  MEDS, Scheduled . acetaminophen  1,000 mg Oral Q6H  . amLODipine  10 mg Oral Daily  . enoxaparin (LOVENOX) injection  30 mg Subcutaneous Q24H  . feeding supplement  237 mL Oral BID BM  . gabapentin  300 mg Oral BID  . hydrochlorothiazide  25 mg Oral Daily  . latanoprost  1 drop Both Eyes QHS  . pantoprazole  40 mg Oral BID AC  . potassium chloride  10 mEq Oral Daily  . saccharomyces boulardii  250 mg Oral BID  . tiZANidine  4 mg Oral QHS    Studies/Results: No results found.  Assessment: s/p Procedure(s): LAPAROSCOPIC PARTIAL COLECTOMY Patient Active Problem List   Diagnosis Date Noted  . Colon cancer, ascending ([place]) 02/10/2020  . Epigastric pain 09/22/2019  . Loss of  weight 07/22/2019  . 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 Abdominal bloating 01/16/2019  . AVM (arteriovenous malformation) of colon 01/16/2019  . Lower abdominal pain 01/16/2019  . 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 Hiatal hernia 01/16/2019  . Symptomatic anemia 09/14/2018  . Overweight (BMI 25.0-29.9) 06/01/2014  . 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 ANEMIA, IRON DEFICIENCY 05/02/2009  . GERD 05/02/2009  . PERSONAL HX COLONIC POLYPS 05/02/2009    Expected post op course  Plan: Pt should be ready for d/c tomorrow if SNF bed available Ambulate in hall Cont solid foods   LOS: 3 days     .Rosario Adie, [place] [facility], [place]    02/13/2020 8:31 AM

## 2020-02-14 DIAGNOSIS — C182 Malignant neoplasm of ascending colon: Secondary | ICD-10-CM | POA: Diagnosis not present

## 2020-02-14 DIAGNOSIS — R4189 Other symptoms and signs involving cognitive functions and awareness: Secondary | ICD-10-CM | POA: Diagnosis not present

## 2020-02-14 DIAGNOSIS — Z5331 Laparoscopic surgical procedure converted to open procedure: Secondary | ICD-10-CM | POA: Diagnosis not present

## 2020-02-14 DIAGNOSIS — K219 Gastro-esophageal reflux disease without esophagitis: Secondary | ICD-10-CM | POA: Diagnosis not present

## 2020-02-14 DIAGNOSIS — K5520 Angiodysplasia of colon without hemorrhage: Secondary | ICD-10-CM | POA: Diagnosis not present

## 2020-02-14 DIAGNOSIS — Z9849 Cataract extraction status, unspecified eye: Secondary | ICD-10-CM | POA: Diagnosis not present

## 2020-02-14 DIAGNOSIS — M4326 Fusion of spine, lumbar region: Secondary | ICD-10-CM | POA: Diagnosis not present

## 2020-02-14 DIAGNOSIS — I10 Essential (primary) hypertension: Secondary | ICD-10-CM | POA: Diagnosis not present

## 2020-02-14 DIAGNOSIS — M6281 Muscle weakness (generalized): Secondary | ICD-10-CM | POA: Diagnosis not present

## 2020-02-14 DIAGNOSIS — E7800 Pure hypercholesterolemia, unspecified: Secondary | ICD-10-CM | POA: Diagnosis not present

## 2020-02-14 DIAGNOSIS — K31819 Angiodysplasia of stomach and duodenum without bleeding: Secondary | ICD-10-CM | POA: Diagnosis not present

## 2020-02-14 DIAGNOSIS — R140 Abdominal distension (gaseous): Secondary | ICD-10-CM | POA: Diagnosis not present

## 2020-02-14 DIAGNOSIS — E876 Hypokalemia: Secondary | ICD-10-CM | POA: Diagnosis not present

## 2020-02-14 DIAGNOSIS — D500 Iron deficiency anemia secondary to blood loss (chronic): Secondary | ICD-10-CM | POA: Diagnosis not present

## 2020-02-14 DIAGNOSIS — E871 Hypo-osmolality and hyponatremia: Secondary | ICD-10-CM | POA: Diagnosis not present

## 2020-02-14 DIAGNOSIS — R29898 Other symptoms and signs involving the musculoskeletal system: Secondary | ICD-10-CM | POA: Diagnosis not present

## 2020-02-14 DIAGNOSIS — C189 Malignant neoplasm of colon, unspecified: Secondary | ICD-10-CM | POA: Diagnosis not present

## 2020-02-14 LAB — SARS CORONAVIRUS 2 BY RT PCR (HOSPITAL ORDER, PERFORMED IN ~~LOC~~ HOSPITAL LAB): SARS Coronavirus 2: NEGATIVE

## 2020-02-14 NOTE — NC FL2 (Signed)
[place] LEVEL OF CARE SCREENING TOOL     IDENTIFICATION  Patient Name: Amber Park Birthdate: 09-Feb-1925 Sex: female Admission Date (Current Location): 02/10/2020  [facility] and [place] Number:  Herbalist and Address:         Provider Number: [id number]  Attending Physician Name and Address:  Leighton Ruff, MD  Relative Name and Phone Number:     Krupa, Stege [phone]  [phone]  Evans,Kathy Daughter [phone]  [phone]  Virgina Organ Daughter [phone]         Current Level of Care: Hospital Recommended Level of Care: [place] Prior Approval Number:    Date Approved/Denied:   PASRR Number: [id number]  Discharge Plan: SNF    Current Diagnoses: Patient Active Problem List   Diagnosis Date Noted  . Colon cancer, ascending ([place]) 02/10/2020  . Epigastric pain 09/22/2019  . Loss of weight 07/22/2019  . 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 Abdominal bloating 01/16/2019  . AVM (arteriovenous malformation) of colon 01/16/2019  . Lower abdominal pain 01/16/2019  . 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 Hiatal hernia 01/16/2019  . Symptomatic anemia 09/14/2018  . Overweight (BMI 25.0-29.9) 06/01/2014  . 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 ANEMIA, IRON DEFICIENCY 05/02/2009  . GERD 05/02/2009  . PERSONAL HX COLONIC POLYPS 05/02/2009    Orientation RESPIRATION BLADDER Height & Weight     Self, Time, Situation, Place  Normal Continent Weight: 125 lb (56.7 kg) Height:  5\' 1"  (154.9 cm)  BEHAVIORAL SYMPTOMS/MOOD NEUROLOGICAL BOWEL NUTRITION STATUS      Continent Diet  AMBULATORY STATUS COMMUNICATION OF NEEDS Skin   Extensive Assist Verbally Surgical wounds (abdomen)                       Personal Care Assistance Level of Assistance  Bathing, Feeding, Dressing Bathing Assistance: Limited  assistance Feeding assistance: Independent Dressing Assistance: Limited assistance     Functional Limitations Info  Sight, Hearing, Speech Sight Info: Impaired Hearing Info: Adequate Speech Info: Adequate    SPECIAL CARE FACTORS FREQUENCY  PT (By licensed PT), OT (By licensed OT)     PT Frequency: 5x/week OT Frequency: 5x/week            Contractures Contractures Info: Not present    Additional Factors Info  Code Status, Allergies Code Status Info: Fullcode Allergies Info: Allergies: Anesthesia S-i-40 Propofol, Codeine, Meperidine Hcl           Current Medications (02/14/2020):  This is the current hospital active medication list Current Facility-Administered Medications  Medication Dose Route Frequency Provider Last Rate Last Admin  . acetaminophen (TYLENOL) tablet 1,000 mg  1,000 mg Oral H6D Leighton Ruff, MD   1,497 mg at 02/13/20 2302  . alum & mag hydroxide-simeth (MAALOX/MYLANTA) 200-200-20 MG/5ML suspension 30 mL  30 mL Oral W2O PRN Leighton Ruff, MD      . amLODipine (NORVASC) tablet 10 mg  10 mg Oral Daily Leighton Ruff, MD   10 mg at 02/14/20 0943  . diphenhydrAMINE (BENADRYL) 12.5 MG/5ML elixir 12.5 mg  12.5 mg Oral V7C PRN Leighton Ruff, MD       Or  . diphenhydrAMINE (BENADRYL) injection 12.5 mg  12.5 mg Intravenous H8I PRN Leighton Ruff, MD      . enoxaparin (LOVENOX) injection 30 mg  30 mg Subcutaneous F02D Leighton Ruff, MD   30 mg at 02/14/20 0709  .  feeding supplement (ENSURE SURGERY) liquid 237 mL  237 mL Oral BID BM Leighton Ruff, MD   277 mL at 02/11/20 1059  . gabapentin (NEURONTIN) capsule 300 mg  300 mg Oral BID Leighton Ruff, MD   824 mg at 02/13/20 2115  . hydrochlorothiazide (HYDRODIURIL) tablet 25 mg  25 mg Oral Daily Leighton Ruff, MD   25 mg at 02/14/20 0942  . HYDROmorphone (DILAUDID) injection 0.5 mg  0.5 mg Intravenous M3N PRN Leighton Ruff, MD   0.5 mg at 02/11/20 2042  . latanoprost (XALATAN) 0.005 % ophthalmic solution 1 drop   1 drop Both Eyes QHS Leighton Ruff, MD   1 drop at 02/13/20 2116  . ondansetron (ZOFRAN) tablet 4 mg  4 mg Oral T6R PRN Leighton Ruff, MD       Or  . ondansetron [facility]) injection 4 mg  4 mg Intravenous W4R PRN Leighton Ruff, MD      . pantoprazole (PROTONIX) EC tablet 40 mg  40 mg Oral BID AC Leighton Ruff, MD   40 mg at 02/14/20 0709  . potassium chloride (KLOR-CON) CR tablet 10 mEq  10 mEq Oral Daily Leighton Ruff, MD   10 mEq at 02/14/20 0942  . saccharomyces boulardii (FLORASTOR) capsule 250 mg  250 mg Oral BID Leighton Ruff, MD   154 mg at 02/14/20 0942  . tiZANidine (ZANAFLEX) tablet 4 mg  4 mg Oral QHS Leighton Ruff, MD   4 mg at 00/86/76 2115     Discharge Medications: Please see discharge summary for a list of discharge medications.  Relevant Imaging Results:  Relevant Lab Results:   Additional Information ssn: [id number]  Lia Hopping, LCSW

## 2020-02-14 NOTE — TOC Transition Note (Addendum)
Transition of Care [facility]) - CM/SW Discharge Note   Patient Details  Name: Amber Park MRN: [id number] Date of Birth: 21-Jul-1925  [facility] CM/SW Contact:  Lia Hopping, [place] Phone Number: 02/14/2020, 4:30 PM   Clinical Narrative:    Covid test resulted negative [facility] [id number] 7/26-28, Care Coordinator is Lurline Hare [phone] CSW provided auth. Detail to Terrilyn Saver at [facility]  Nurse call report to: [phone] Ext. [phone] Room [address] Patient daughter will transport.  Final next level of care: Skilled Nursing Facility Barriers to Discharge: No Barriers Identified   Patient Goals and CMS Choice Patient states their goals for this hospitalization and ongoing recovery are:: go to rehab before returning home   Choice offered to / list presented to : Patient  Discharge Placement   Existing PASRR number confirmed : 02/14/20          Patient chooses bed at: [facility] Patient to be transferred to facility by: Daughter to transport Name of family member notified: Juliann Pulse [phone] Patient and family notified of of transfer: 02/14/20  Discharge Plan and Services In-house Referral: Clinical Social Work Discharge Planning Services: NA            DME Arranged: N/A DME Agency: NA       HH Arranged: NA HH Agency: NA        Social Determinants of Health ([place]) Interventions     Readmission Risk Interventions No flowsheet data found.

## 2020-02-14 NOTE — Discharge Summary (Signed)
Physician Discharge Summary  Patient ID: Amber Park MRN: [id number] DOB/AGE: 84/27/1926 84 y.o.  Admit date: 02/10/2020 Discharge date: 02/14/2020  Admission Diagnoses: colon cancer  Discharge Diagnoses:  Active Problems:   Colon cancer, ascending [facility])   Discharged Condition: good  Hospital Course: Patient was admitted to the hospital after laparoscopic right colectomy.  Her diet was advanced as tolerated.  By postop day 4 the patient was tolerating a diet and having bowel function.  She had no pain at this point.  She was seen by physical therapy and 24-hour supervision was recommended.  This cannot be provided by her family members and therefore we have recommended a skilled nursing facility prior to discharge to home.  Consults: None  Significant Diagnostic Studies: labs: cbc, bmet  Treatments: IV hydration, analgesia: acetaminophen and surgery: lap R colectomy  Discharge Exam: Blood pressure (!) 144/63, pulse 76, temperature (!) 97.5 F (36.4 C), temperature source Oral, resp. rate 18, height 5\' 1"  (1.549 m), weight 56.7 kg, SpO2 97 %. General appearance: alert and cooperative GI: normal findings: soft, non-tender Incision/Wound: clean, dry, intact  Disposition: Discharge disposition: 03-Skilled Nursing Facility        Allergies as of 02/14/2020      Reactions   Anesthesia S-i-40 [propofol] Nausea And Vomiting   Anesthetics -- N/V   Codeine Nausea And Vomiting   Meperidine Hcl Nausea And Vomiting      Medication List    STOP taking these medications   docusate sodium 100 MG capsule Commonly known as: COLACE     TAKE these medications   acetaminophen 500 MG tablet Commonly known as: TYLENOL Take 500 mg by mouth at bedtime.   amLODipine 10 MG tablet Commonly known as: NORVASC Take 10 mg by mouth daily.   hydrochlorothiazide 25 MG tablet Commonly known as: HYDRODIURIL Take 25 mg by mouth daily.   hydrocortisone cream 1 % Apply 1 application  topically daily as needed for itching.   latanoprost 0.005 % ophthalmic solution Commonly known as: XALATAN Place 1 drop into both eyes at bedtime.   pantoprazole 40 MG tablet Commonly known as: PROTONIX Take 1 tablet (40 mg total) by mouth 2 (two) times daily before a meal.   potassium chloride 10 MEQ tablet Commonly known as: KLOR-CON Take 10 mEq by mouth daily.   tiZANidine 4 MG tablet Commonly known as: ZANAFLEX Take 1 tablet (4 mg total) by mouth every 8 (eight) hours as needed for muscle spasms. What changed: when to take this   Vitamin D3 50 MCG (2000 UT) capsule Take 2,000 Units by mouth daily.       Follow-up Information    Leighton Ruff, MD. Schedule an appointment as soon as possible for a visit in 2 week(s).   Specialty: General Surgery Contact information: [address] [place] [address] [phone]               Signed: Rosario Adie [birth year], 8:36 AM

## 2020-02-14 NOTE — Progress Notes (Signed)
Discharge instructions given to pt and all questions were answered.  

## 2020-02-14 NOTE — Discharge Instructions (Signed)

## 2020-02-14 NOTE — Progress Notes (Signed)
Physical Therapy Treatment Patient Details Name: Amber Park MRN: [id number] DOB: Jan 13, 1925 Today's Date: 02/14/2020    History of Present Illness The patient is a 86 year old female who presents with colorectal cancer. She underwent a colonoscopy January 03, 2020.  Mass was noted in the cecum and tattooed.  Biopsy showed adenocarcinoma.  CT scan shows thickened central wall thickening of the cecum and terminal ileum with some mildly enlarged mesenteric lymph nodes.  There are multiple small small pulmonary nodules.  These are nonspecific.  There is a large hiatal hernia. Patient is in assisted living, but handles all of her daily activities by herself.    PT Comments    Pt assisted with ambulating in hallway and performing a few exercises in sitting.  Pt anticipates d/c to SNF.  Pt prefers SNF prior to return to her apartment alone.  She would like assist available initially while she recovers.  Follow Up Recommendations  SNF;Supervision for mobility/OOB     Equipment Recommendations  None recommended by PT    Recommendations for Other Services       Precautions / Restrictions Precautions Precautions: Fall    Mobility  Bed Mobility Overal bed mobility: Modified Independent                Transfers Overall transfer level: Needs assistance Equipment used: Rolling walker (2 wheeled) Transfers: Sit to/from Stand Sit to Stand: Min guard         General transfer comment: cues for hand placement, min/guard for safety  Ambulation/Gait Ambulation/Gait assistance: Min guard Gait Distance (Feet): 240 Feet Assistive device: Rolling walker (2 wheeled) Gait Pattern/deviations: Step-through pattern;Decreased stride length;Narrow base of support     General Gait Details: verbal cues for safe use of RW, pt denies any symptoms during ambulation however felt fatigued upon return to room   Stairs             Wheelchair Mobility    Modified Rankin (Stroke Patients  Only)       Balance Overall balance assessment: Needs assistance         Standing balance support: During functional activity;Bilateral upper extremity supported Standing balance-Leahy Scale: Poor                              Cognition Arousal/Alertness: Awake/alert Behavior During Therapy: WFL for tasks assessed/performed Overall Cognitive Status: Within Functional Limits for tasks assessed                                        Exercises General Exercises - Lower Extremity Ankle Circles/Pumps: AROM;10 reps;Seated;Both Long Arc Quad: AROM;Seated;10 reps;Both Hip Flexion/Marching: AROM;Both;10 reps;Seated    General Comments        Pertinent Vitals/Pain Pain Assessment: No/denies pain    Home Living                      Prior Function            PT Goals (current goals can now be found in the care plan section) Progress towards PT goals: Progressing toward goals    Frequency    Min 3X/week      PT Plan Current plan remains appropriate    Co-evaluation              AM-PAC PT "6 Clicks" Mobility  Outcome Measure  Help needed turning from your back to your side while in a flat bed without using bedrails?: None Help needed moving from lying on your back to sitting on the side of a flat bed without using bedrails?: None Help needed moving to and from a bed to a chair (including a wheelchair)?: A Little Help needed standing up from a chair using your arms (e.g., wheelchair or bedside chair)?: A Little Help needed to walk in hospital room?: A Little Help needed climbing 3-5 steps with a railing? : A Little 6 Click Score: 20    End of Session Equipment Utilized During Treatment: Gait belt Activity Tolerance: Patient tolerated treatment well Patient left: with call bell/phone within reach;in bed   PT Visit Diagnosis: Muscle weakness (generalized) (M62.81);Difficulty in walking, not elsewhere classified (R26.2)      Time: 7782-4235 PT Time Calculation (min) (ACUTE ONLY): 13 min  Charges:  $Gait Training: 8-22 mins                    Arlyce Dice, DPT Acute Rehabilitation Services Pager: [phone] Office: [phone]    Raiyah Speakman,KATHrine E 02/14/2020, 1:20 PM

## 2020-02-14 NOTE — Progress Notes (Addendum)
4 Days Post-Op Lap R colectomy Subjective: having BM's.  No nausea. No further pain.  Tolerating a diet.  Feeling better  Objective: Vital signs in last 24 hours: Temp:  [97.5 F (36.4 C)-98.3 F (36.8 C)] 97.5 F (36.4 C) (07/26 0543) Pulse Rate:  [76-91] 76 (07/26 0543) Resp:  [14-18] 18 (07/26 0543) BP: (136-148)/(49-63) 144/63 (07/26 0543) SpO2:  [95 %-97 %] 97 % (07/26 0543)   Intake/Output from previous day: 07/25 0701 - 07/26 0700 In: 960 [P.O.:960] Out: 1825 [Urine:1825] Intake/Output this shift: Total I/O In: 120 [P.O.:120] Out: -    General appearance: alert and cooperative GI: normal findings: soft, moderately distended  Incision: no significant drainage  Lab Results:  Recent Labs    02/12/20 0632 02/13/20 0603  WBC 14.4* 11.0*  HGB 8.8* 9.9*  HCT 27.8* 30.9*  PLT 366 398   BMET Recent Labs    02/12/20 0632 02/13/20 0603  NA 128* 131*  K 3.1* 3.2*  CL 90* 91*  CO2 30 31  GLUCOSE 89 95  BUN 12 15  CREATININE 0.95 0.96  CALCIUM 8.9 9.1   PT/INR No results for input(s): LABPROT, INR in the last 72 hours. ABG No results for input(s): PHART, HCO3 in the last 72 hours.  Invalid input(s): PCO2, PO2  MEDS, Scheduled . acetaminophen  1,000 mg Oral Q6H  . amLODipine  10 mg Oral Daily  . enoxaparin (LOVENOX) injection  30 mg Subcutaneous Q24H  . feeding supplement  237 mL Oral BID BM  . gabapentin  300 mg Oral BID  . hydrochlorothiazide  25 mg Oral Daily  . latanoprost  1 drop Both Eyes QHS  . pantoprazole  40 mg Oral BID AC  . potassium chloride  10 mEq Oral Daily  . saccharomyces boulardii  250 mg Oral BID  . tiZANidine  4 mg Oral QHS    Studies/Results: No results found.  Assessment: s/p Procedure(s): LAPAROSCOPIC PARTIAL COLECTOMY Patient Active Problem List   Diagnosis Date Noted  . Colon cancer, ascending ([place]) 02/10/2020  . Epigastric pain 09/22/2019  . Loss of weight 07/22/2019  . AVM (arteriovenous malformation) of  stomach, acquired 07/22/2019  . Abdominal bloating 01/16/2019  . AVM (arteriovenous malformation) of colon 01/16/2019  . Lower abdominal pain 01/16/2019  . 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 Hiatal hernia 01/16/2019  . Symptomatic anemia 09/14/2018  . Overweight (BMI 25.0-29.9) 06/01/2014  . 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 ANEMIA, IRON DEFICIENCY 05/02/2009  . GERD 05/02/2009  . PERSONAL HX COLONIC POLYPS 05/02/2009    Expected post op course  Plan: Pt ready for d/c if SNF bed available Ambulate in hall Cont solid foods   LOS: 4 days     .Rosario Adie, [facility], [place]    02/14/2020 10:07 AM

## 2020-02-14 NOTE — TOC Initial Note (Signed)
Transition of Care [facility]) - Initial/Assessment Note    Patient Details  Name: Amber Park MRN: [id number] Date of Birth: 1924/11/20  [facility] CM/SW Contact:    Lia Hopping, [place] Phone Number: 02/14/2020, 10:44 AM  Clinical Narrative:                 CSW met with the patient at bedside to discuss discharge to SNF. Patient understands she will go to rehab before returning to her independent living a [place]. Patient gave csw permission to talk with her Park Juliann Pulse and reach out to SNF. CSW reached out to pt. Park. She reports the facility will have a bed for the patient today. CSW reached out to SNF. The facility rep. Katie confirm a bed, pending rapid covid test and insurance authorization.  CSW notified Park of d/c barriers at this time.   Nurse to notify the physician to order a rapid covid test [employer] authorization started/ Auth pending reference # [id number]   Expected Discharge Plan: Skilled Nursing Facility Barriers to Discharge: Insurance Authorization, Other (comment) (covid)   Patient Goals and CMS Choice Patient states their goals for this hospitalization and ongoing recovery are:: go to rehab before returning home   Choice offered to / list presented to : Patient  Expected Discharge Plan and Services Expected Discharge Plan: [place] In-house Referral: Clinical Social Work Discharge Planning Services: NA   Living arrangements for the past 2 months: [place] Expected Discharge Date: 02/14/20               DME Arranged: N/A DME Agency: NA       HH Arranged: NA [place] Agency: NA        Prior Living Arrangements/Services Living arrangements for the past 2 months: [place] Lives with:: Facility Resident Patient language and need for interpreter reviewed:: Yes Do you feel safe going back to the place where you live?: Yes      Need for Family  Participation in Patient Care: Yes (Comment) Care giver support system in place?: Yes (comment)   Criminal Activity/Legal Involvement Pertinent to Current Situation/Hospitalization: No - Comment as needed  Activities of Daily Living Home Assistive Devices/Equipment: Gilford Rile (specify type), Grab bars around toilet, Grab bars in shower, Hand-held shower hose, Eyeglasses (Rollator walker) ADL Screening (condition at time of admission) Patient's cognitive ability adequate to safely complete daily activities?: Yes Is the patient deaf or have difficulty hearing?: No Does the patient have difficulty seeing, even when wearing glasses/contacts?: No Does the patient have difficulty concentrating, remembering, or making decisions?: No Patient able to express need for assistance with ADLs?: Yes Does the patient have difficulty dressing or bathing?: No Independently performs ADLs?: Yes (appropriate for developmental age) Does the patient have difficulty walking or climbing stairs?: Yes Weakness of Legs: Both Weakness of Arms/Hands: None  Permission Sought/Granted Permission sought to share information with : Facility Sport and exercise psychologist, Family Supports Permission granted to share information with : Yes, Verbal Permission Granted  Share Information with NAME: Amber Park,Amber Park  Permission granted to share info w AGENCY: [place] granted to share info w Relationship: Park  Permission granted to share info w Contact Information: [phone]  [phone]  Emotional Assessment Appearance:: Appears stated age Attitude/Demeanor/Rapport: Engaged Affect (typically observed): Accepting Orientation: : Oriented to Self, Oriented to Place, Oriented to  Time, Oriented to Situation Alcohol / Substance Use: Not Applicable Psych Involvement: No (comment)  Admission  diagnosis:  Colon cancer, ascending ([place]) [C18.2] Patient Active Problem List   Diagnosis Date Noted  .  Colon cancer, ascending ([place]) 02/10/2020  . Epigastric pain 09/22/2019  . Loss of weight 07/22/2019  . 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 Abdominal bloating 01/16/2019  . AVM (arteriovenous malformation) of colon 01/16/2019  . Lower abdominal pain 01/16/2019  . 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 Hiatal hernia 01/16/2019  . Symptomatic anemia 09/14/2018  . Overweight (BMI 25.0-29.9) 06/01/2014  . 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 ANEMIA, IRON DEFICIENCY 05/02/2009  . GERD 05/02/2009  . PERSONAL HX COLONIC POLYPS 05/02/2009   PCP:  Lajean Manes, MD Pharmacy:   [facility] [place], [address] [place] [address] Phone: [phone] Fax: [phone]     Social Determinants of Health (SDOH) Interventions    Readmission Risk Interventions No flowsheet data found.

## 2020-02-14 NOTE — Anesthesia Postprocedure Evaluation (Signed)
Anesthesia Post Note  Patient: Amber Park  Procedure(s) Performed: LAPAROSCOPIC PARTIAL COLECTOMY (N/A Abdomen)     Patient location during evaluation: PACU Anesthesia Type: General Level of consciousness: awake and alert Pain management: pain level controlled Vital Signs Assessment: post-procedure vital signs reviewed and stable Respiratory status: spontaneous breathing, nonlabored ventilation, respiratory function stable and patient connected to nasal cannula oxygen Cardiovascular status: blood pressure returned to baseline and stable Postop Assessment: no apparent nausea or vomiting Anesthetic complications: no   No complications documented.  Last Vitals:  Vitals:   02/13/20 2011 02/14/20 0543  BP: (!) 148/49 (!) 144/63  Pulse: 87 76  Resp: 18 18  Temp: 36.8 C (!) 36.4 C  SpO2: 95% 97%    Last Pain:  Vitals:   02/14/20 0543  TempSrc: Oral  PainSc:                  Amber Park

## 2020-02-15 LAB — SURGICAL PATHOLOGY

## 2020-02-16 ENCOUNTER — Non-Acute Institutional Stay (SKILLED_NURSING_FACILITY): Payer: Medicare Other | Admitting: Nurse Practitioner

## 2020-02-16 ENCOUNTER — Encounter: Payer: Self-pay | Admitting: Nurse Practitioner

## 2020-02-16 DIAGNOSIS — K219 Gastro-esophageal reflux disease without esophagitis: Secondary | ICD-10-CM | POA: Diagnosis not present

## 2020-02-16 DIAGNOSIS — E871 Hypo-osmolality and hyponatremia: Secondary | ICD-10-CM | POA: Diagnosis not present

## 2020-02-16 DIAGNOSIS — C182 Malignant neoplasm of ascending colon: Secondary | ICD-10-CM | POA: Diagnosis not present

## 2020-02-16 DIAGNOSIS — R140 Abdominal distension (gaseous): Secondary | ICD-10-CM | POA: Diagnosis not present

## 2020-02-16 DIAGNOSIS — E876 Hypokalemia: Secondary | ICD-10-CM

## 2020-02-16 DIAGNOSIS — D500 Iron deficiency anemia secondary to blood loss (chronic): Secondary | ICD-10-CM

## 2020-02-16 NOTE — Progress Notes (Signed)
Location:   SNF [place] Room Number: 25 Place of Service:  SNF (31) Provider: Lennie Odor Fumiko Cham NP  Lajean Manes, MD  Patient Care Team: Lajean Manes, MD as PCP - General (Internal Medicine) Jonnie Finner, RN as Oncology Nurse Navigator Ladell Pier, MD as Consulting Physician (Oncology)  Extended Emergency Contact Information Primary Emergency Contact: Nairn,Robert Address:            [address] [address] [address] of [place] Phone: [phone] Mobile Phone: [phone] Relation: Son Secondary Emergency Contact: Jacquelynn Cree of [place] Phone: [phone] Mobile Phone: [phone] Relation: Daughter  Code Status: DNR Goals of care: Advanced Directive information Advanced Directives 02/10/2020  Does Patient Have a Medical Advance Directive? Unable to assess, patient is non-responsive or altered mental status  Type of Advance Directive -  Does patient want to make changes to medical advance directive? -  Copy of [place] in Chart? -  Would patient like information on creating a medical advance directive? -     Chief Complaint  Patient presents with  . Acute Visit    Med review    HPI:  Pt is a 86 y.o. female seen today for an acute visit for hospitalization 02/10/20-02/14/20 for ascending colon cancer, had laparoscopic right colectomy.  HTN, takes Amlodipine 535m qd, HCTZ 263mqd  GERD, takes Pantoprazole.   Muscle spasm, prn Tizanidine 35m335m8hr   Hyponatremia, Na 131 02/13/20  Anemia, Hgb 9.9 02/13/20     Past Medical History:  Diagnosis Date  . Anemia    iron deficiency  . Anxiety   . Arthritis   . AVM (arteriovenous malformation)    colon, stomach and bowel ( per progress note)  . Cancer (HCC)    hx of skin cancer removed from nose   . Family history of adverse reaction to anesthesia    Pt son has PONV  . GERD (gastroesophageal reflux disease)   . Glaucoma   . Hearing aid worn    B/L  .  History of blood transfusion 1963  . History of hiatal hernia   . History of left bundle branch block    old ekg 06-23-11 dr stoFelipa Eth chart  . HOH (hard of hearing)   . Hypertension   . Pneumonia    hx of pneumonia x 4   . PONV (postoperative nausea and vomiting)   . Shortness of breath dyspnea    with exertion   . Wears glasses    Past Surgical History:  Procedure Laterality Date  . ABDOMINAL HYSTERECTOMY    . BACK SURGERY     x 2  . BIOPSY  01/03/2020   Procedure: BIOPSY;  Surgeon: ManRush LandmarkbTelford NabMD;  [facility];;  . BREAST SURGERY     bilateral cyst removal   . COLONOSCOPY WITH PROPOFOL N/A 01/03/2020   Procedure: COLONOSCOPY WITH PROPOFOL;  Surgeon: ManRush LandmarkbTelford NabMD;  [facility];  Laterality: N/A;  . ENTEROSCOPY N/A 09/16/2018   Procedure: ENTEROSCOPY;  Surgeon: ManRush LandmarkbTelford NabMD;  [facility];  Service: Gastroenterology;  Laterality: N/A;  . ENTEROSCOPY N/A 01/03/2020   Procedure: ENTEROSCOPY;  Surgeon: ManRush LandmarkbTelford NabMD;  [facility];  Service: Gastroenterology;  Laterality: N/A;  . HOT HEMOSTASIS N/A 09/16/2018   Procedure: HOT HEMOSTASIS (ARGON PLASMA COAGULATION/BICAP);  Surgeon: ManIrving CopasMD;  [facility];  Laterality: N/A;  . LAPAROSCOPIC  PARTIAL COLECTOMY N/A 02/10/2020   Procedure: LAPAROSCOPIC PARTIAL COLECTOMY;  Surgeon: Leighton Ruff, MD;  [facility];  Service: General;  Laterality: N/A;  . POLYPECTOMY  01/03/2020   Procedure: POLYPECTOMY;  Surgeon: Irving Copas., MD;  Location: [place];  Service: Gastroenterology;;  . Lia Foyer TATTOO INJECTION  01/03/2020   Procedure: SUBMUCOSAL TATTOO INJECTION;  Surgeon: Irving Copas., MD;  Location: [place];  Service: Gastroenterology;;  . TONSILLECTOMY    . TOTAL HIP ARTHROPLASTY Left 05/31/2014    Procedure: LEFT TOTAL HIP ARTHROPLASTY ANTERIOR APPROACH;  Surgeon: Mauri Pole, MD;  [facility];  Service: Orthopedics;  Laterality: Left;    Allergies  Allergen Reactions  . Anesthesia S-I-40 [Propofol] Nausea And Vomiting    Anesthetics -- N/V  . Codeine Nausea And Vomiting  . Meperidine Hcl Nausea And Vomiting    Allergies as of 02/16/2020      Reactions   Anesthesia S-i-40 [propofol] Nausea And Vomiting   Anesthetics -- N/V   Codeine Nausea And Vomiting   Meperidine Hcl Nausea And Vomiting      Medication List       Accurate as of February 16, 2020 11:59 PM. If you have any questions, ask your nurse or doctor.        acetaminophen 500 MG tablet Commonly known as: TYLENOL Take 500 mg by mouth at bedtime.   amLODipine 10 MG tablet Commonly known as: NORVASC Take 10 mg by mouth daily.   hydrochlorothiazide 25 MG tablet Commonly known as: HYDRODIURIL Take 25 mg by mouth daily.   hydrocortisone cream 1 % Apply 1 application topically daily as needed for itching.   latanoprost 0.005 % ophthalmic solution Commonly known as: XALATAN Place 1 drop into both eyes at bedtime.   pantoprazole 40 MG tablet Commonly known as: PROTONIX Take 1 tablet (40 mg total) by mouth 2 (two) times daily before a meal.   potassium chloride 10 MEQ tablet Commonly known as: KLOR-CON Take 10 mEq by mouth daily.   tiZANidine 4 MG tablet Commonly known as: ZANAFLEX Take 1 tablet (4 mg total) by mouth every 8 (eight) hours as needed for muscle spasms. What changed: when to take this   Vitamin D3 50 MCG (2000 UT) capsule Take 2,000 Units by mouth daily.       Review of Systems  Constitutional: Negative for activity change, appetite change and fever.  HENT: Negative for congestion, trouble swallowing and voice change.   Respiratory: Negative for cough, shortness of breath and wheezing.   Cardiovascular: Positive for leg swelling. Negative for chest pain and palpitations.    Gastrointestinal: Negative for abdominal distention, abdominal pain, blood in stool, constipation, diarrhea, nausea and vomiting.  Genitourinary: Negative for difficulty urinating, dysuria and urgency.  Musculoskeletal: Positive for gait problem.  Skin: Negative for color change and pallor.  Neurological: Negative for speech difficulty, light-headedness and headaches.  Psychiatric/Behavioral: Negative for behavioral problems, confusion and sleep disturbance. The patient is not nervous/anxious.     Immunization History  Administered Date(s) Administered  . Influenza, High Dose Seasonal PF 05/08/2015  . Influenza,inj,quad, With Preservative 03/23/2019  . Moderna SARS-COVID-2 Vaccination 07/26/2019, 08/23/2019  . Pneumococcal-Unspecified 08/20/2016  . Tdap 09/04/2014  . Tetanus 09/04/2014  . Zoster Recombinat (Shingrix) 01/06/2018, 05/13/2018   Pertinent  Health Maintenance Due  Topic Date Due  . DEXA SCAN  Never done  . PNA vac Low Risk Adult (2 of 2 - PCV13) 08/20/2017  . INFLUENZA VACCINE  02/20/2020  Fall Risk  12/18/2016  Falls in the past year? No   Functional Status Survey:    Vitals:   02/16/20 1437  BP: (!) 144/58  Pulse: 88  Resp: 20  Temp: (!) 97.1 F (36.2 C)  SpO2: 93%  Weight: 127 lb 6.4 oz (57.8 kg)  Height: '5\' 1"'  (1.549 m)   Body mass index is 24.07 kg/m. Physical Exam Constitutional:      General: She is not in acute distress.    Appearance: Normal appearance. She is not ill-appearing, toxic-appearing or diaphoretic.  HENT:     Head: Normocephalic and atraumatic.     Nose: Nose normal.     Mouth/Throat:     Mouth: Mucous membranes are moist.  Eyes:     Extraocular Movements: Extraocular movements intact.     Conjunctiva/sclera: Conjunctivae normal.     Pupils: Pupils are equal, round, and reactive to light.  Cardiovascular:     Rate and Rhythm: Normal rate and regular rhythm.     Heart sounds: No murmur heard.   Pulmonary:     Effort:  Pulmonary effort is normal.     Breath sounds: No wheezing, rhonchi or rales.  Chest:     Chest wall: No tenderness.  Abdominal:     General: Bowel sounds are normal. There is no distension.     Palpations: Abdomen is soft.     Tenderness: There is no abdominal tenderness. There is no right CVA tenderness, left CVA tenderness, guarding or rebound.  Musculoskeletal:     Cervical back: Normal range of motion and neck supple.     Right lower leg: Edema present.     Left lower leg: No edema.     Comments: Trace edema RLE  Skin:    General: Skin is warm and dry.     Comments: Healed punctured wounds abd pain, slightly sore when palpated.   Neurological:     General: No focal deficit present.     Mental Status: She is alert and oriented to person, place, and time. Mental status is at baseline.     Gait: Gait abnormal.  Psychiatric:        Mood and Affect: Mood normal.        Behavior: Behavior normal.        Thought Content: Thought content normal.        Judgment: Judgment normal.     Labs reviewed: Recent Labs    02/11/20 0458 02/12/20 0632 02/13/20 0603  NA 127* 128* 131*  K 3.2* 3.1* 3.2*  CL 93* 90* 91*  CO2 '25 30 31  ' GLUCOSE 132* 89 95  BUN '11 12 15  ' CREATININE 1.01* 0.95 0.96  CALCIUM 8.5* 8.9 9.1   Recent Labs    11/20/19 1358 01/03/20 1300  AST 28 24  ALT 14 13  ALKPHOS 85 72  BILITOT 0.6 0.5  PROT 7.0 5.8*  ALBUMIN 3.7 3.1*   Recent Labs    09/14/19 1143 10/12/19 1120 11/15/19 1127 11/15/19 1127 11/20/19 1358 01/03/20 1300 02/11/20 0458 02/12/20 0632 02/13/20 0603  WBC 12.5*   < > 13.1*   < > 14.6*   < > 13.6* 14.4* 11.0*  NEUTROABS 9.7*  --  10.4*  --  12.5*  --   --   --   --   HGB 11.9*   < > 11.4*   < > 10.8*   < > 8.2* 8.8* 9.9*  HCT 36.6   < > 35.4*   < >  33.7*   < > 25.9* 27.8* 30.9*  MCV 79.2   < > 79.7   < > 79.5*   < > 78.5* 77.9* 77.1*  PLT 427.0*   < > 429.0*   < > 467*   < > 333 366 398   < > = values in this interval not  displayed.   No results found for: TSH No results found for: HGBA1C No results found for: CHOL, HDL, LDLCALC, LDLDIRECT, TRIG, CHOLHDL  Significant Diagnostic Results in last 30 days:  No results found.  Assessment/Plan: Colon cancer, ascending ([place]) hospitalization 02/10/20-02/14/20 for ascending colon cancer, had laparoscopic right colectomy. Healing nicely. SNF [facility] for therapy, her [place] to return [place] [facility]   GERD Stable, continue Pantoprazole.   Abdominal bloating Post op, stable, Hgb 9.9 02/13/20  Hyponatremia Serum Na 131 02/13/20  Hypokalemia Serum K 3.2, the patient stated her appetite/oral intake has improved, will update CMP/eGFR  ANEMIA, IRON DEFICIENCY Hx of AVM, superimposed post op/blood loss, will repeat CBC/diff.     Family/ staff Communication: plan of care reviewed with the patient and charge nurse.   Labs/tests ordered: CBC/diff, CMP/eGFR   Time spend 35 minutes.

## 2020-02-17 ENCOUNTER — Encounter: Payer: Self-pay | Admitting: Nurse Practitioner

## 2020-02-17 ENCOUNTER — Non-Acute Institutional Stay (SKILLED_NURSING_FACILITY): Payer: Medicare Other | Admitting: Nurse Practitioner

## 2020-02-17 DIAGNOSIS — C182 Malignant neoplasm of ascending colon: Secondary | ICD-10-CM

## 2020-02-17 DIAGNOSIS — E871 Hypo-osmolality and hyponatremia: Secondary | ICD-10-CM | POA: Diagnosis not present

## 2020-02-17 DIAGNOSIS — K219 Gastro-esophageal reflux disease without esophagitis: Secondary | ICD-10-CM

## 2020-02-17 DIAGNOSIS — I10 Essential (primary) hypertension: Secondary | ICD-10-CM | POA: Diagnosis not present

## 2020-02-17 DIAGNOSIS — D500 Iron deficiency anemia secondary to blood loss (chronic): Secondary | ICD-10-CM | POA: Diagnosis not present

## 2020-02-17 NOTE — Progress Notes (Signed)
Location:   [place] Room Number: 61 Place of Service:  SNF (31)  Provider: Marlana Latus NP  PCP: Lajean Manes, MD Patient Care Team: Lajean Manes, MD as PCP - General (Internal Medicine) Jonnie Finner, RN as Oncology Nurse Navigator Ladell Pier, MD as Consulting Physician (Oncology)  Extended Emergency Contact Information Primary Emergency Contact: Ciotti,Robert Address:            [address] [address] [address] of [place] Phone: [phone] Mobile Phone: [phone] Relation: Son Secondary Emergency Contact: Jacquelynn Cree of [place] Phone: [phone] Mobile Phone: [phone] Relation: Daughter  Code Status: DNR Goals of care:  Advanced Directive information Advanced Directives 02/10/2020  Does Patient Have a Medical Advance Directive? Unable to assess, patient is non-responsive or altered mental status  Type of Advance Directive -  Does patient want to make changes to medical advance directive? -  Copy of [place] in Chart? -  Would patient like information on creating a medical advance directive? -     Allergies  Allergen Reactions  . Anesthesia S-I-40 [Propofol] Nausea And Vomiting    Anesthetics -- N/V  . Codeine Nausea And Vomiting  . Meperidine Hcl Nausea And Vomiting    Chief Complaint  Patient presents with  . Discharge Note    Discharge    HPI:  86 y.o. female with PMH of HTN, GERD, muscle spasm, Hyponatremia, anemia was admitted to [facility] for therapy following  02/10/20-02/14/20 hospital stay  for ascending colon cancer, had laparoscopic right colectomy. She is functioning at [facility] and ready to return home [place] [facility]  HTN, blood pressure is controlled, takes Amlodipine, HCTZ  GERD, takes Pantoprazole  Hyponatremia, solved  Anemia, pending CBC, last Hgb 9.9. 02/13/20    Past Medical History:  Diagnosis Date  . Anemia    iron deficiency  . Anxiety   . Arthritis    . AVM (arteriovenous malformation)    colon, stomach and bowel ( per progress note)  . Cancer (HCC)    hx of skin cancer removed from nose   . Family history of adverse reaction to anesthesia    Pt son has PONV  . GERD (gastroesophageal reflux disease)   . Glaucoma   . Hearing aid worn    B/L  . History of blood transfusion 1963  . History of hiatal hernia   . History of left bundle branch block    old ekg 06-23-11 dr Felipa Eth on chart  . HOH (hard of hearing)   . Hypertension   . Pneumonia    hx of pneumonia x 4   . PONV (postoperative nausea and vomiting)   . Shortness of breath dyspnea    with exertion   . Wears glasses     Past Surgical History:  Procedure Laterality Date  . ABDOMINAL HYSTERECTOMY    . BACK SURGERY     x 2  . BIOPSY  01/03/2020   Procedure: BIOPSY;  Surgeon: Rush Landmark Telford Nab., MD;  Location: [place];  Service: Gastroenterology;;  . BREAST SURGERY     bilateral cyst removal   . COLONOSCOPY WITH PROPOFOL N/A 01/03/2020   Procedure: COLONOSCOPY WITH PROPOFOL;  Surgeon: Rush Landmark Telford Nab., MD;  Location: [place];  Service: Gastroenterology;  Laterality: N/A;  . ENTEROSCOPY N/A 09/16/2018   Procedure: ENTEROSCOPY;  Surgeon: Rush Landmark Telford Nab., MD;  Location: [facility]COPY;  Service: Gastroenterology;  Laterality: N/A;  . ENTEROSCOPY N/A 01/03/2020  Procedure: ENTEROSCOPY;  Surgeon: Rush Landmark Telford Nab., MD;  Location: [place];  Service: Gastroenterology;  Laterality: N/A;  . HOT HEMOSTASIS N/A 09/16/2018   Procedure: HOT HEMOSTASIS (ARGON PLASMA COAGULATION/BICAP);  Surgeon: Irving Copas., MD;  Location: [place];  Service: Gastroenterology;  Laterality: N/A;  . LAPAROSCOPIC PARTIAL COLECTOMY N/A 02/10/2020   Procedure: LAPAROSCOPIC PARTIAL COLECTOMY;  Surgeon: Leighton Ruff, MD;  [facility];  Service: General;  Laterality: N/A;  . POLYPECTOMY  01/03/2020   Procedure: POLYPECTOMY;  Surgeon: Irving Copas., MD;  Location: [place];  Service: Gastroenterology;;  . Lia Foyer TATTOO INJECTION  01/03/2020   Procedure: SUBMUCOSAL TATTOO INJECTION;  Surgeon: Irving Copas., MD;  Location: [place];  Service: Gastroenterology;;  . TONSILLECTOMY    . TOTAL HIP ARTHROPLASTY Left 05/31/2014   Procedure: LEFT TOTAL HIP ARTHROPLASTY ANTERIOR APPROACH;  Surgeon: Mauri Pole, MD;  [facility];  Service: Orthopedics;  Laterality: Left;      reports that she quit smoking about 16 years ago. Her smoking use included cigarettes. She started smoking about 66 years ago. She smoked 0.30 packs per day. She has never used smokeless tobacco. She reports previous alcohol use. She reports that she does not use drugs. Social History   Socioeconomic History  . Marital status: Widowed    Spouse name: Not on file  . Number of children: 3  . Years of education: Not on file  . Highest education level: Not on file  Occupational History  . Occupation: Retired Designer, multimedia at [employer]  . Smoking status: Former Smoker    Packs/day: 0.30    Types: Cigarettes    Start date: 1955    Quit date: 2005    Years since quitting: 16.5  . Smokeless tobacco: Never Used  Vaping Use  . Vaping Use: Never used  Substance and Sexual Activity  . Alcohol use: Not Currently    Comment: rare   . Drug use: No  . Sexual activity: Not on file  Other Topics Concern  . Not on file  Social History Narrative  . Not on file   Social Determinants of Health   Financial Resource Strain:   . Difficulty of Paying Living Expenses:   Food Insecurity:   . Worried About Charity fundraiser in the Last Year:   . Arboriculturist in the Last Year:   Transportation Needs:   . Film/video editor (Medical):   Marland Kitchen Lack of Transportation (Non-Medical):   Physical Activity:   . Days of Exercise per Week:   . Minutes of Exercise per Session:   Stress:   . Feeling of Stress :   Social  Connections:   . Frequency of Communication with Friends and Family:   . Frequency of Social Gatherings with Friends and Family:   . Attends Religious Services:   . Active Member of Clubs or Organizations:   . Attends Archivist Meetings:   Marland Kitchen Marital Status:   Intimate Partner Violence:   . Fear of Current or Ex-Partner:   . Emotionally Abused:   Marland Kitchen Physically Abused:   . Sexually Abused:    Functional Status Survey:    Allergies  Allergen Reactions  . Anesthesia S-I-40 [Propofol] Nausea And Vomiting    Anesthetics -- N/V  . Codeine Nausea And Vomiting  . Meperidine Hcl Nausea And Vomiting    Pertinent  Health Maintenance Due  Topic Date Due  . DEXA SCAN  Never done  . PNA vac Low Risk Adult (2 of 2 - PCV13) 08/20/2017  . INFLUENZA VACCINE  02/20/2020    Medications: Allergies as of 02/17/2020      Reactions   Anesthesia S-i-40 [propofol] Nausea And Vomiting   Anesthetics -- N/V   Codeine Nausea And Vomiting   Meperidine Hcl Nausea And Vomiting      Medication List       Accurate as of February 17, 2020 11:59 PM. If you have any questions, ask your nurse or doctor.        acetaminophen 500 MG tablet Commonly known as: TYLENOL Take 500 mg by mouth at bedtime.   amLODipine 10 MG tablet Commonly known as: NORVASC Take 10 mg by mouth daily.   hydrochlorothiazide 25 MG tablet Commonly known as: HYDRODIURIL Take 25 mg by mouth daily.   hydrocortisone cream 1 % Apply 1 application topically daily as needed for itching.   latanoprost 0.005 % ophthalmic solution Commonly known as: XALATAN Place 1 drop into both eyes at bedtime.   pantoprazole 40 MG tablet Commonly known as: PROTONIX Take 1 tablet (40 mg total) by mouth 2 (two) times daily before a meal.   potassium chloride 10 MEQ tablet Commonly known as: KLOR-CON Take 10 mEq by mouth daily.   tiZANidine 4 MG tablet Commonly known as: ZANAFLEX Take 1 tablet (4 mg total) by mouth every 8 (eight)  hours as needed for muscle spasms. What changed: when to take this   Vitamin D3 50 MCG (2000 UT) capsule Take 2,000 Units by mouth daily.       Review of Systems  Constitutional: Negative for appetite change, fatigue and fever.  HENT: Negative for congestion, trouble swallowing and voice change.   Respiratory: Negative for cough, shortness of breath and wheezing.   Cardiovascular: Positive for leg swelling. Negative for chest pain and palpitations.  Gastrointestinal: Negative for abdominal pain, blood in stool and constipation.  Genitourinary: Negative for difficulty urinating, dysuria and urgency.  Musculoskeletal: Positive for gait problem.  Skin: Negative for color change and pallor.  Neurological: Negative for dizziness, speech difficulty, weakness and headaches.  Psychiatric/Behavioral: Negative for behavioral problems, confusion and sleep disturbance. The patient is not nervous/anxious.     Vitals:   02/17/20 1439  BP: (!) 142/70  Pulse: 98  Resp: 20  Temp: 97.8 F (36.6 C)  SpO2: 98%  Weight: 127 lb 6.4 oz (57.8 kg)  Height: 5' 1" (1.549 m)   Body mass index is 24.07 kg/m. Physical Exam Constitutional:      Appearance: Normal appearance.  HENT:     Head: Normocephalic and atraumatic.     Mouth/Throat:     Mouth: Mucous membranes are moist.  Eyes:     Extraocular Movements: Extraocular movements intact.     Conjunctiva/sclera: Conjunctivae normal.     Pupils: Pupils are equal, round, and reactive to light.  Cardiovascular:     Rate and Rhythm: Normal rate and regular rhythm.     Heart sounds: No murmur heard.   Pulmonary:     Effort: Pulmonary effort is normal.     Breath sounds: No wheezing or rales.  Abdominal:     General: Bowel sounds are normal.     Palpations: Abdomen is soft.     Tenderness: There is no abdominal tenderness.  Musculoskeletal:     Cervical back: Normal range of motion and neck supple.     Right lower leg: Edema present.  Left  lower leg: No edema.     Comments: Trace edema RLE  Skin:    General: Skin is warm and dry.     Comments: Healed punctured wounds abd pain, slightly sore when palpated.   Neurological:     General: No focal deficit present.     Mental Status: She is alert and oriented to person, place, and time. Mental status is at baseline.     Gait: Gait abnormal.  Psychiatric:        Mood and Affect: Mood normal.        Behavior: Behavior normal.        Thought Content: Thought content normal.        Judgment: Judgment normal.     Labs reviewed: Basic Metabolic Panel: Recent Labs    02/11/20 0458 02/12/20 0632 02/13/20 0603  NA 127* 128* 131*  K 3.2* 3.1* 3.2*  CL 93* 90* 91*  CO2 _0 GLUCOSE 132* 89 95  BUN _1 CREATININE 1.01* 0.95 0.96  CALCIUM 8.5* 8.9 9.1   Liver Function Tests: Recent Labs    11/20/19 1358 01/03/20 1300  AST 28 24  ALT 14 13  ALKPHOS 85 72  BILITOT 0.6 0.5  PROT 7.0 5.8*  ALBUMIN 3.7 3.1*   No results for input(s): LIPASE, AMYLASE in the last 8760 hours. No results for input(s): AMMONIA in the last 8760 hours. CBC: Recent Labs    09/14/19 1143 10/12/19 1120 11/15/19 1127 11/15/19 1127 11/20/19 1358 01/03/20 1300 02/11/20 0458 02/12/20 0632 02/13/20 0603  WBC 12.5*   < > 13.1*   < > 14.6*   < > 13.6* 14.4* 11.0*  NEUTROABS 9.7*  --  10.4*  --  12.5*  --   --   --   --   HGB 11.9*   < > 11.4*   < > 10.8*   < > 8.2* 8.8* 9.9*  HCT 36.6   < > 35.4*   < > 33.7*   < > 25.9* 27.8* 30.9*  MCV 79.2   < > 79.7   < > 79.5*   < > 78.5* 77.9* 77.1*  PLT 427.0*   < > 429.0*   < > 467*   < > 333 366 398   < > = values in this interval not displayed.   Cardiac Enzymes: No results for input(s): CKTOTAL, CKMB, CKMBINDEX, TROPONINI in the last 8760 hours. BNP: Invalid input(s): POCBNP CBG: No results for input(s): GLUCAP in the last 8760 hours.  Procedures and Imaging Studies During Stay: No results found.  Assessment/Plan:   Colon  cancer, ascending [facility]) S/p laparoscopic right colectomy. She is functioning at [facility] and ready to return [place] [facility]   GERD Stable, continue Pantoprazole.   ANEMIA, IRON DEFICIENCY Pending f/u CBC, last Hgb 9.9 02/13/20  Hyponatremia Resolved. Pending f/u CMP/eGFR  HTN (hypertension) Blood pressure is controlled, continue HCTZ, Amlodipine.    Patient is being discharged with the following home health services:    Patient is being discharged with the following durable medical equipment:    Patient has been advised to f/u with their PCP in 1-2 weeks to bring them up to date on their rehab stay.  Social services at facility was responsible for arranging this appointment.  Pt was provided with a 30 day supply of prescriptions for medications and refills must be obtained from their PCP.  For controlled substances, a more limited supply may be provided  adequate until PCP appointment only.  Future labs/tests needed:  Pending CBC/diff, CMP/eGFR

## 2020-02-19 ENCOUNTER — Encounter: Payer: Self-pay | Admitting: Nurse Practitioner

## 2020-02-19 DIAGNOSIS — I10 Essential (primary) hypertension: Secondary | ICD-10-CM | POA: Insufficient documentation

## 2020-02-19 DIAGNOSIS — E876 Hypokalemia: Secondary | ICD-10-CM | POA: Insufficient documentation

## 2020-02-19 DIAGNOSIS — E871 Hypo-osmolality and hyponatremia: Secondary | ICD-10-CM | POA: Insufficient documentation

## 2020-02-19 NOTE — Assessment & Plan Note (Signed)
Stable, continue Pantoprazole.  

## 2020-02-19 NOTE — Assessment & Plan Note (Signed)
Hx of AVM, superimposed post op/blood loss, will repeat CBC/diff.

## 2020-02-19 NOTE — Assessment & Plan Note (Signed)
Resolved. Pending f/u CMP/eGFR

## 2020-02-19 NOTE — Assessment & Plan Note (Signed)
Post op, stable, Hgb 9.9 02/13/20

## 2020-02-19 NOTE — Assessment & Plan Note (Signed)
Serum K 3.2, the patient stated her appetite/oral intake has improved, will update CMP/eGFR

## 2020-02-19 NOTE — Assessment & Plan Note (Signed)
S/p laparoscopic right colectomy. She is functioning at [facility] and ready to return home [place]

## 2020-02-19 NOTE — Assessment & Plan Note (Signed)
Blood pressure is controlled, continue HCTZ, Amlodipine.

## 2020-02-19 NOTE — Assessment & Plan Note (Signed)
Serum Na 131 02/13/20

## 2020-02-19 NOTE — Assessment & Plan Note (Signed)
hospitalization 02/10/20-02/14/20 for ascending colon cancer, had laparoscopic right colectomy. Healing nicely. SNF [facility] for therapy, her goal is to return [place]

## 2020-02-19 NOTE — Assessment & Plan Note (Signed)
Pending f/u CBC, last Hgb 9.9 02/13/20

## 2020-02-22 ENCOUNTER — Encounter (HOSPITAL_COMMUNITY): Payer: Self-pay | Admitting: Oncology

## 2020-03-07 ENCOUNTER — Inpatient Hospital Stay: Payer: Medicare Other | Attending: Oncology | Admitting: Oncology

## 2020-03-07 ENCOUNTER — Inpatient Hospital Stay: Payer: Medicare Other

## 2020-03-07 ENCOUNTER — Other Ambulatory Visit: Payer: Self-pay

## 2020-03-07 VITALS — BP 146/46 | HR 88 | Temp 97.4°F | Resp 16 | Wt 127.0 lb

## 2020-03-07 DIAGNOSIS — C182 Malignant neoplasm of ascending colon: Secondary | ICD-10-CM

## 2020-03-07 DIAGNOSIS — K449 Diaphragmatic hernia without obstruction or gangrene: Secondary | ICD-10-CM | POA: Diagnosis not present

## 2020-03-07 DIAGNOSIS — C180 Malignant neoplasm of cecum: Secondary | ICD-10-CM | POA: Diagnosis not present

## 2020-03-07 DIAGNOSIS — K5520 Angiodysplasia of colon without hemorrhage: Secondary | ICD-10-CM | POA: Diagnosis not present

## 2020-03-07 DIAGNOSIS — I10 Essential (primary) hypertension: Secondary | ICD-10-CM | POA: Insufficient documentation

## 2020-03-07 DIAGNOSIS — Z8052 Family history of malignant neoplasm of bladder: Secondary | ICD-10-CM | POA: Insufficient documentation

## 2020-03-07 DIAGNOSIS — D500 Iron deficiency anemia secondary to blood loss (chronic): Secondary | ICD-10-CM | POA: Insufficient documentation

## 2020-03-07 LAB — CBC WITH DIFFERENTIAL (CANCER CENTER ONLY)
Abs Immature Granulocytes: 0.01 10*3/uL (ref 0.00–0.07)
Basophils Absolute: 0.1 10*3/uL (ref 0.0–0.1)
Basophils Relative: 2 %
Eosinophils Absolute: 0.2 10*3/uL (ref 0.0–0.5)
Eosinophils Relative: 3 %
HCT: 32.9 % — ABNORMAL LOW (ref 36.0–46.0)
Hemoglobin: 10.2 g/dL — ABNORMAL LOW (ref 12.0–15.0)
Immature Granulocytes: 0 %
Lymphocytes Relative: 23 %
Lymphs Abs: 1.5 10*3/uL (ref 0.7–4.0)
MCH: 23.4 pg — ABNORMAL LOW (ref 26.0–34.0)
MCHC: 31 g/dL (ref 30.0–36.0)
MCV: 75.6 fL — ABNORMAL LOW (ref 80.0–100.0)
Monocytes Absolute: 0.7 10*3/uL (ref 0.1–1.0)
Monocytes Relative: 10 %
Neutro Abs: 4.2 10*3/uL (ref 1.7–7.7)
Neutrophils Relative %: 62 %
Platelet Count: 401 10*3/uL — ABNORMAL HIGH (ref 150–400)
RBC: 4.35 MIL/uL (ref 3.87–5.11)
RDW: 14.6 % (ref 11.5–15.5)
WBC Count: 6.7 10*3/uL (ref 4.0–10.5)
nRBC: 0 % (ref 0.0–0.2)

## 2020-03-07 NOTE — Progress Notes (Signed)
[facility] OFFICE PROGRESS NOTE   Diagnosis: Colon cancer  INTERVAL HISTORY:   Ms. [facility] returns today as scheduled.  She is here with her daughter.  She underwent a laparoscopic right colectomy by Dr. Marcello Moores on 02/10/2020.  There was no evidence of metastatic disease.  A cecal mass with tattoo was noted. The pathology revealed an invasive moderate to poorly differentiated adenocarcinoma.  Tumor invaded through the muscularis propria into pericolonic tissue.  The resection margins are negative.  Metastatic carcinoma was identified in 1 of 27 lymph nodes.  There was a single satellite nodule.  No lymphovascular or perineural invasion.  There was loss of MLH1 and PMS 2 expression.  The tumor returned MSI high.  She has recovered from surgery.  She is now back in her apartment.  Her bowels are moving.  Objective:  Vital signs in last 24 hours:  There were no vitals taken for this visit.     Resp: Lungs clear bilaterally Cardio: Regular rate and rhythm GI: Soft, healed surgical incisions, no hepatosplenomegaly, no mass Vascular: No leg edema   Lab Results:  Lab Results  Component Value Date   WBC 11.0 (H) 02/13/2020   HGB 9.9 (L) 02/13/2020   HCT 30.9 (L) 02/13/2020   MCV 77.1 (L) 02/13/2020   PLT 398 02/13/2020   NEUTROABS 12.5 (H) 11/20/2019    CMP  Lab Results  Component Value Date   NA 131 (L) 02/13/2020   K 3.2 (L) 02/13/2020   CL 91 (L) 02/13/2020   CO2 31 02/13/2020   GLUCOSE 95 02/13/2020   BUN 15 02/13/2020   CREATININE 0.96 02/13/2020   CALCIUM 9.1 02/13/2020   PROT 5.8 (L) 01/03/2020   ALBUMIN 3.1 (L) 01/03/2020   AST 24 01/03/2020   ALT 13 01/03/2020   ALKPHOS 72 01/03/2020   BILITOT 0.5 01/03/2020   GFRNONAA 51 (L) 02/13/2020   GFRAA 60 (L) 02/13/2020    Lab Results  Component Value Date   CEA1 2.9 01/03/2020    Medications: I have reviewed the patient's current medications.   Assessment/Plan: 1. Colon cancer-stage  IIIb  Cecal mass on colonoscopy 01/03/2020, biopsy confirmed adenocarcinoma  CTs 01/11/2020-cecal mass, prominent subcentimeter mesenteric nodes adjacent to the cecum, multiple tiny pulmonary nodules-metastases not favored  Laparoscopic right colectomy 02/10/2020,pT3pN1a, grade 2-grade 3, 1/27 lymph nodes, 1 tumor deposit, MSI-high, loss of MLH1 and PMS 2 expression 2. 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 and history of AVMs/Cameron's erosions 3. Hiatal hernia 4. Family history of colon and bladder cancer 5. Transverse colon tubular adenoma on the colonoscopy 01/03/2020 6. Hypertension 7. Glaucoma    Disposition: Ms. [facility] underwent a laparoscopic right colectomy last month.  She has been diagnosed with stage III colon cancer.  I reviewed details of the surgical pathology report with Ms. [facility] and her daughter.  There is loss of MLH1 and PMS 2 expression in the tumor.  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  We will request MLH1 methylation testing to confirm a sporadic tumor.  We discussed the prognosis and reviewed details of the surgical pathology report.  She has a significant chance of developing recurrent colorectal cancer over the next few years.  I discussed the expected benefit associated with adjuvant 5-fluorouracil chemotherapy.  We discussed potential toxicities associated with capecitabine.  She will be 89 next month.  I explained colon cancer recurrence generally occurs within 2 to 3 years of  diagnosis.  She would be a candidate for immunotherapy if the tumor progresses.  Ms. [facility] does not wish to receive adjuvant therapy.  I think this is a good decision at her age and with the option of immunotherapy in the metastatic setting.  She will return for an office visit and CEA in 4 months.    Betsy Coder, MD  03/07/2020  4:03 PM

## 2020-03-08 ENCOUNTER — Telehealth: Payer: Self-pay | Admitting: Oncology

## 2020-03-08 ENCOUNTER — Other Ambulatory Visit: Payer: Self-pay

## 2020-03-08 ENCOUNTER — Telehealth: Payer: Self-pay | Admitting: *Deleted

## 2020-03-08 NOTE — Telephone Encounter (Signed)
Scheduled appointments per 8/17 los. Patient is aware of appointments date and times.

## 2020-03-08 NOTE — Telephone Encounter (Signed)
Patient had asked late yesterday if she does take Xeloda how will that change the potential recurrence rate for her compared to no treatment. Per Dr. Benay Spice :No treatment has 30-35% chance of recurrence and w/Xeloda it is 20-235 chance of recurrence. She will continue to think about it. Informed her CEA is not back yet, but she will be called when it results.

## 2020-03-09 NOTE — Telephone Encounter (Signed)
Left VM that CEA was not ordered to be done the day she was in the office. MD ordered it to be collected in December.

## 2020-03-17 ENCOUNTER — Encounter (HOSPITAL_COMMUNITY): Payer: Self-pay | Admitting: Oncology

## 2020-05-01 ENCOUNTER — Other Ambulatory Visit: Payer: Self-pay | Admitting: Gastroenterology

## 2020-05-10 DIAGNOSIS — H40053 Ocular hypertension, bilateral: Secondary | ICD-10-CM | POA: Diagnosis not present

## 2020-05-10 DIAGNOSIS — Z961 Presence of intraocular lens: Secondary | ICD-10-CM | POA: Diagnosis not present

## 2020-06-13 DIAGNOSIS — L309 Dermatitis, unspecified: Secondary | ICD-10-CM | POA: Diagnosis not present

## 2020-06-13 DIAGNOSIS — L821 Other seborrheic keratosis: Secondary | ICD-10-CM | POA: Diagnosis not present

## 2020-07-07 ENCOUNTER — Telehealth: Payer: Self-pay | Admitting: Nurse Practitioner

## 2020-07-07 ENCOUNTER — Other Ambulatory Visit: Payer: Self-pay

## 2020-07-07 ENCOUNTER — Inpatient Hospital Stay: Payer: Medicare Other | Attending: Hematology and Oncology

## 2020-07-07 ENCOUNTER — Inpatient Hospital Stay: Payer: Medicare Other | Admitting: Nurse Practitioner

## 2020-07-07 ENCOUNTER — Encounter: Payer: Self-pay | Admitting: Nurse Practitioner

## 2020-07-07 VITALS — BP 164/56 | HR 90 | Temp 98.5°F | Resp 16 | Ht 61.0 in | Wt 138.6 lb

## 2020-07-07 DIAGNOSIS — C182 Malignant neoplasm of ascending colon: Secondary | ICD-10-CM | POA: Diagnosis not present

## 2020-07-07 DIAGNOSIS — H409 Unspecified glaucoma: Secondary | ICD-10-CM | POA: Insufficient documentation

## 2020-07-07 DIAGNOSIS — Z800 Family history of malignant neoplasm of digestive organs: Secondary | ICD-10-CM | POA: Insufficient documentation

## 2020-07-07 DIAGNOSIS — C180 Malignant neoplasm of cecum: Secondary | ICD-10-CM | POA: Diagnosis not present

## 2020-07-07 DIAGNOSIS — D509 Iron deficiency anemia, unspecified: Secondary | ICD-10-CM | POA: Insufficient documentation

## 2020-07-07 DIAGNOSIS — Z8052 Family history of malignant neoplasm of bladder: Secondary | ICD-10-CM | POA: Insufficient documentation

## 2020-07-07 DIAGNOSIS — D123 Benign neoplasm of transverse colon: Secondary | ICD-10-CM | POA: Insufficient documentation

## 2020-07-07 DIAGNOSIS — K449 Diaphragmatic hernia without obstruction or gangrene: Secondary | ICD-10-CM | POA: Diagnosis not present

## 2020-07-07 DIAGNOSIS — I10 Essential (primary) hypertension: Secondary | ICD-10-CM | POA: Insufficient documentation

## 2020-07-07 DIAGNOSIS — Z79899 Other long term (current) drug therapy: Secondary | ICD-10-CM | POA: Insufficient documentation

## 2020-07-07 LAB — CBC WITH DIFFERENTIAL/PLATELET
Abs Immature Granulocytes: 0.01 10*3/uL (ref 0.00–0.07)
Basophils Absolute: 0.1 10*3/uL (ref 0.0–0.1)
Basophils Relative: 1 %
Eosinophils Absolute: 0.1 10*3/uL (ref 0.0–0.5)
Eosinophils Relative: 2 %
HCT: 34.5 % — ABNORMAL LOW (ref 36.0–46.0)
Hemoglobin: 10.4 g/dL — ABNORMAL LOW (ref 12.0–15.0)
Immature Granulocytes: 0 %
Lymphocytes Relative: 20 %
Lymphs Abs: 1.5 10*3/uL (ref 0.7–4.0)
MCH: 21.4 pg — ABNORMAL LOW (ref 26.0–34.0)
MCHC: 30.1 g/dL (ref 30.0–36.0)
MCV: 71 fL — ABNORMAL LOW (ref 80.0–100.0)
Monocytes Absolute: 0.6 10*3/uL (ref 0.1–1.0)
Monocytes Relative: 8 %
Neutro Abs: 5.1 10*3/uL (ref 1.7–7.7)
Neutrophils Relative %: 69 %
Platelets: 350 10*3/uL (ref 150–400)
RBC: 4.86 MIL/uL (ref 3.87–5.11)
RDW: 15.9 % — ABNORMAL HIGH (ref 11.5–15.5)
WBC: 7.3 10*3/uL (ref 4.0–10.5)
nRBC: 0 % (ref 0.0–0.2)

## 2020-07-07 NOTE — Progress Notes (Addendum)
  [place] OFFICE PROGRESS NOTE   Diagnosis: Colon cancer  INTERVAL HISTORY:   Ms. A[facility]i returns as scheduled.  Overall she feels well.  No change in bowel habits since surgery.  She tends to have frequent bowel movements in the morning hours.  She is not aware of any bleeding.  No significant abdominal pain.  She has a good appetite.  Objective:  Vital signs in last 24 hours:  Blood pressure (!) 164/56, pulse 90, temperature 98.5 F (36.9 C), resp. rate 16, height $RemoveBe'5\' 1"'nMvupAwmU$  (1.549 m), weight 138 lb 9.6 oz (62.9 kg), SpO2 99 %.    HEENT: Neck without mass. Lymphatics: No palpable cervical, supraclavicular, axillary or inguinal lymph nodes. Resp: Lungs clear bilaterally. Cardio: Regular rate and rhythm. GI: Abdomen soft and nontender.  No hepatomegaly. Vascular: No leg edema.  Lab Results:  Lab Results  Component Value Date   WBC 6.7 03/07/2020   HGB 10.2 (L) 03/07/2020   HCT 32.9 (L) 03/07/2020   MCV 75.6 (L) 03/07/2020   PLT 401 (H) 03/07/2020   NEUTROABS 4.2 03/07/2020    Imaging:  No results found.  Medications: I have reviewed the patient's current medications.  Assessment/Plan: 1. Colon cancer-stage IIIb ? Cecal mass on colonoscopy 01/03/2020, biopsy confirmed adenocarcinoma ? CTs 01/11/2020-cecal mass, prominent subcentimeter mesenteric nodes adjacent to the cecum, multiple tiny pulmonary nodules-metastases not favored ? Laparoscopic right colectomy 02/10/2020,pT3pN1a, grade 2-grade 3, 1/27 lymph nodes, 1 tumor deposit, MSI-high, loss of MLH1 and PMS 2 expression; MLH1 methylation detected 2. 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 and history of AVMs/Cameron's erosions 3. Hiatal hernia 4. Family history of colon and bladder cancer 5. Transverse colon tubular adenoma on the colonoscopy 01/03/2020 6. Hypertension 7. Glaucoma   Disposition: Ms. [facility] appears stable.  We will follow-up on the CEA from today.  We reviewed the MLH1 methylation testing  which indicates a sporadic tumor.  We discussed the CBC from today.  She has a microcytic anemia.  She has a history of iron deficiency and has had IV iron in the past, tolerated without evidence of a reaction.  We will schedule her to receive Feraheme weekly x2 in the next few weeks.  She will return for lab and follow-up in approximately 2 months.  She will contact the office in the interim with any problems.  We specifically discussed signs/symptoms suggestive of progressive anemia.  Patient seen with Dr. Luberta Robertson ANP/GNP-BC   07/07/2020  3:02 PM This was a shared visit with Ned Card.  We discussed the molecular testing with Ms. [facility] and her daughter.  She appears to have a sporadic tumor.  He is in remission from colon cancer.  She has persistent iron deficiency.  She cannot tolerate oral iron.  She sees Feraheme and return for a repeat CBC and ferritin in 2 months.  Julieanne Manson, MD

## 2020-07-07 NOTE — Telephone Encounter (Signed)
Scheduled per los. Gave avs and calendar  

## 2020-07-10 LAB — IRON AND TIBC
Iron: 17 ug/dL — ABNORMAL LOW (ref 41–142)
Saturation Ratios: 4 % — ABNORMAL LOW (ref 21–57)
TIBC: 418 ug/dL (ref 236–444)
UIBC: 400 ug/dL — ABNORMAL HIGH (ref 120–384)

## 2020-07-10 LAB — FERRITIN: Ferritin: <4 ng/mL — ABNORMAL LOW (ref 11–307)

## 2020-07-10 LAB — CEA (IN HOUSE-CHCC): CEA (CHCC-In House): <1 ng/mL (ref 0.00–5.00)

## 2020-07-13 ENCOUNTER — Telehealth: Payer: Self-pay | Admitting: *Deleted

## 2020-07-13 ENCOUNTER — Other Ambulatory Visit: Payer: Self-pay | Admitting: Oncology

## 2020-07-13 DIAGNOSIS — Z96642 Presence of left artificial hip joint: Secondary | ICD-10-CM | POA: Diagnosis not present

## 2020-07-13 DIAGNOSIS — M25552 Pain in left hip: Secondary | ICD-10-CM | POA: Diagnosis not present

## 2020-07-13 DIAGNOSIS — M7062 Trochanteric bursitis, left hip: Secondary | ICD-10-CM | POA: Diagnosis not present

## 2020-07-13 DIAGNOSIS — M25562 Pain in left knee: Secondary | ICD-10-CM | POA: Diagnosis not present

## 2020-07-13 NOTE — Telephone Encounter (Signed)
Called patient back with ferritin result and CEA. She is aware of iron infusion appointments.

## 2020-07-24 ENCOUNTER — Inpatient Hospital Stay: Payer: Medicare Other | Attending: Nurse Practitioner

## 2020-07-24 ENCOUNTER — Ambulatory Visit: Payer: Medicare Other

## 2020-07-24 ENCOUNTER — Encounter (HOSPITAL_COMMUNITY): Payer: Self-pay

## 2020-07-24 ENCOUNTER — Other Ambulatory Visit: Payer: Self-pay

## 2020-07-24 ENCOUNTER — Other Ambulatory Visit: Payer: Self-pay | Admitting: Medical

## 2020-07-24 ENCOUNTER — Other Ambulatory Visit: Payer: Self-pay | Admitting: Nurse Practitioner

## 2020-07-24 ENCOUNTER — Ambulatory Visit (HOSPITAL_BASED_OUTPATIENT_CLINIC_OR_DEPARTMENT_OTHER): Payer: Medicare Other | Admitting: Medical

## 2020-07-24 ENCOUNTER — Emergency Department (HOSPITAL_COMMUNITY): Payer: Medicare Other

## 2020-07-24 ENCOUNTER — Emergency Department (HOSPITAL_COMMUNITY)
Admission: EM | Admit: 2020-07-24 | Discharge: 2020-07-24 | Disposition: A | Discharge status: Home / Self Care | Payer: Medicare Other | Attending: Emergency Medicine | Admitting: Emergency Medicine

## 2020-07-24 VITALS — BP 127/50 | HR 90 | Temp 97.6°F | Resp 18

## 2020-07-24 DIAGNOSIS — R42 Dizziness and giddiness: Secondary | ICD-10-CM

## 2020-07-24 DIAGNOSIS — R112 Nausea with vomiting, unspecified: Secondary | ICD-10-CM

## 2020-07-24 DIAGNOSIS — R0789 Other chest pain: Secondary | ICD-10-CM | POA: Insufficient documentation

## 2020-07-24 DIAGNOSIS — R197 Diarrhea, unspecified: Secondary | ICD-10-CM | POA: Diagnosis not present

## 2020-07-24 DIAGNOSIS — R55 Syncope and collapse: Secondary | ICD-10-CM | POA: Insufficient documentation

## 2020-07-24 DIAGNOSIS — T8089XA Other complications following infusion, transfusion and therapeutic injection, initial encounter: Secondary | ICD-10-CM | POA: Diagnosis not present

## 2020-07-24 DIAGNOSIS — I950 Idiopathic hypotension: Secondary | ICD-10-CM | POA: Diagnosis not present

## 2020-07-24 DIAGNOSIS — R6883 Chills (without fever): Secondary | ICD-10-CM | POA: Insufficient documentation

## 2020-07-24 DIAGNOSIS — J90 Pleural effusion, not elsewhere classified: Secondary | ICD-10-CM | POA: Diagnosis not present

## 2020-07-24 DIAGNOSIS — Z20822 Contact with and (suspected) exposure to covid-19: Secondary | ICD-10-CM | POA: Insufficient documentation

## 2020-07-24 DIAGNOSIS — I959 Hypotension, unspecified: Secondary | ICD-10-CM | POA: Insufficient documentation

## 2020-07-24 DIAGNOSIS — Z85828 Personal history of other malignant neoplasm of skin: Secondary | ICD-10-CM | POA: Diagnosis not present

## 2020-07-24 DIAGNOSIS — Z79899 Other long term (current) drug therapy: Secondary | ICD-10-CM | POA: Diagnosis not present

## 2020-07-24 DIAGNOSIS — E860 Dehydration: Secondary | ICD-10-CM | POA: Insufficient documentation

## 2020-07-24 DIAGNOSIS — D500 Iron deficiency anemia secondary to blood loss (chronic): Secondary | ICD-10-CM

## 2020-07-24 DIAGNOSIS — I10 Essential (primary) hypertension: Secondary | ICD-10-CM | POA: Insufficient documentation

## 2020-07-24 DIAGNOSIS — D509 Iron deficiency anemia, unspecified: Secondary | ICD-10-CM

## 2020-07-24 DIAGNOSIS — Z87891 Personal history of nicotine dependence: Secondary | ICD-10-CM | POA: Diagnosis not present

## 2020-07-24 DIAGNOSIS — D72829 Elevated white blood cell count, unspecified: Secondary | ICD-10-CM | POA: Insufficient documentation

## 2020-07-24 DIAGNOSIS — Z96642 Presence of left artificial hip joint: Secondary | ICD-10-CM | POA: Diagnosis not present

## 2020-07-24 DIAGNOSIS — K449 Diaphragmatic hernia without obstruction or gangrene: Secondary | ICD-10-CM | POA: Diagnosis not present

## 2020-07-24 DIAGNOSIS — T8090XA Unspecified complication following infusion and therapeutic injection, initial encounter: Secondary | ICD-10-CM

## 2020-07-24 LAB — CBC
HCT: 44.3 % (ref 36.0–46.0)
Hemoglobin: 13 g/dL (ref 12.0–15.0)
MCH: 21.5 pg — ABNORMAL LOW (ref 26.0–34.0)
MCHC: 29.3 g/dL — ABNORMAL LOW (ref 30.0–36.0)
MCV: 73.3 fL — ABNORMAL LOW (ref 80.0–100.0)
Platelets: 432 10*3/uL — ABNORMAL HIGH (ref 150–400)
RBC: 6.04 MIL/uL — ABNORMAL HIGH (ref 3.87–5.11)
RDW: 17.7 % — ABNORMAL HIGH (ref 11.5–15.5)
WBC: 25.4 10*3/uL — ABNORMAL HIGH (ref 4.0–10.5)
nRBC: 0 % (ref 0.0–0.2)

## 2020-07-24 LAB — URINALYSIS, ROUTINE W REFLEX MICROSCOPIC
Bilirubin Urine: NEGATIVE
Glucose, UA: NEGATIVE mg/dL
Hgb urine dipstick: NEGATIVE
Ketones, ur: NEGATIVE mg/dL
Leukocytes,Ua: NEGATIVE
Nitrite: NEGATIVE
Protein, ur: NEGATIVE mg/dL
Specific Gravity, Urine: 1.013 (ref 1.005–1.030)
pH: 5 (ref 5.0–8.0)

## 2020-07-24 LAB — BASIC METABOLIC PANEL
Anion gap: 11 (ref 5–15)
BUN: 31 mg/dL — ABNORMAL HIGH (ref 8–23)
CO2: 22 mmol/L (ref 22–32)
Calcium: 8.7 mg/dL — ABNORMAL LOW (ref 8.9–10.3)
Chloride: 104 mmol/L (ref 98–111)
Creatinine, Ser: 1.33 mg/dL — ABNORMAL HIGH (ref 0.44–1.00)
GFR, Estimated: 37 mL/min — ABNORMAL LOW (ref >60)
Glucose, Bld: 134 mg/dL — ABNORMAL HIGH (ref 70–99)
Potassium: 3.1 mmol/L — ABNORMAL LOW (ref 3.5–5.1)
Sodium: 137 mmol/L (ref 135–145)

## 2020-07-24 LAB — TROPONIN I (HIGH SENSITIVITY)
Troponin I (High Sensitivity): 23 ng/L — ABNORMAL HIGH (ref <18)
Troponin I (High Sensitivity): 24 ng/L — ABNORMAL HIGH (ref <18)

## 2020-07-24 LAB — HEPATIC FUNCTION PANEL
ALT: 13 U/L (ref 0–44)
AST: 26 U/L (ref 15–41)
Albumin: 4.1 g/dL (ref 3.5–5.0)
Alkaline Phosphatase: 82 U/L (ref 38–126)
Bilirubin, Direct: 0.1 mg/dL (ref 0.0–0.2)
Indirect Bilirubin: 0.6 mg/dL (ref 0.3–0.9)
Total Bilirubin: 0.7 mg/dL (ref 0.3–1.2)
Total Protein: 6.7 g/dL (ref 6.5–8.1)

## 2020-07-24 LAB — CBG MONITORING, ED: Glucose-Capillary: 120 mg/dL — ABNORMAL HIGH (ref 70–99)

## 2020-07-24 LAB — LACTIC ACID, PLASMA: Lactic Acid, Venous: 1.4 mmol/L (ref 0.5–1.9)

## 2020-07-24 LAB — RESP PANEL BY RT-PCR (FLU A&B, COVID) ARPGX2
Influenza A by PCR: NEGATIVE
Influenza B by PCR: NEGATIVE
SARS Coronavirus 2 by RT PCR: NEGATIVE

## 2020-07-24 MED ORDER — SODIUM CHLORIDE 0.9 % IV SOLN
250.0000 mg | Freq: Once | INTRAVENOUS | Status: AC
  Administered 2020-07-24: 250 mg via INTRAVENOUS
  Filled 2020-07-24: qty 20

## 2020-07-24 MED ORDER — SODIUM CHLORIDE 0.9 % IV BOLUS
500.0000 mL | Freq: Once | INTRAVENOUS | Status: AC
  Administered 2020-07-24: 500 mL via INTRAVENOUS

## 2020-07-24 MED ORDER — FAMOTIDINE IN NACL 20-0.9 MG/50ML-% IV SOLN
20.0000 mg | Freq: Once | INTRAVENOUS | Status: AC | PRN
  Administered 2020-07-24: 20 mg via INTRAVENOUS

## 2020-07-24 MED ORDER — ONDANSETRON HCL 4 MG PO TABS: 4.0000 mg | ORAL_TABLET | Freq: Four times a day (QID) | ORAL | 0 refills | Status: DC | PRN

## 2020-07-24 MED ORDER — DIPHENOXYLATE-ATROPINE 2.5-0.025 MG PO TABS
1.0000 | ORAL_TABLET | Freq: Once | ORAL | Status: AC
  Administered 2020-07-24: 1 via ORAL

## 2020-07-24 MED ORDER — POTASSIUM CHLORIDE CRYS ER 20 MEQ PO TBCR
40.0000 meq | EXTENDED_RELEASE_TABLET | Freq: Once | ORAL | Status: AC
  Administered 2020-07-24: 40 meq via ORAL
  Filled 2020-07-24: qty 2

## 2020-07-24 MED ORDER — SODIUM CHLORIDE 0.9 % IV SOLN
Freq: Once | INTRAVENOUS | Status: AC
  Filled 2020-07-24: qty 250

## 2020-07-24 MED ORDER — SODIUM CHLORIDE 0.9 % IV SOLN
250.0000 mg | Freq: Once | INTRAVENOUS | Status: DC
  Filled 2020-07-24: qty 20

## 2020-07-24 MED ORDER — FAMOTIDINE IN NACL 20-0.9 MG/50ML-% IV SOLN
INTRAVENOUS | Status: AC
  Filled 2020-07-24: qty 50

## 2020-07-24 MED ORDER — DIPHENOXYLATE-ATROPINE 2.5-0.025 MG PO TABS
ORAL_TABLET | ORAL | Status: AC
  Filled 2020-07-24: qty 1

## 2020-07-24 NOTE — Progress Notes (Signed)
Upon discharge, patient began throwing up and complaining of feeling worse. Marga Hoots, PA, to take patient to ED.

## 2020-07-24 NOTE — Progress Notes (Signed)
1.5 hours into ferrlecit infusion, patient requested to use the restroom. When ambulating to the restroom, patient became increasingly weak. RN assisted patient onto toilet, where she then stated she felt like she was going to pass out. Iron infusion paused. Patient hypotensive, short of breath, flushed and clammy. She began to slump forward on toilet, and when asked if she knew where she was, her speech was slurred. Marga Hoots, PA, notified. Patient given NS at 525mL/hr and pepcid 20mg . Patient had diarrhea x3, advised to give lomotil. Patient requested the iron infusion not be completed. Marga Hoots, PA, notified.

## 2020-07-24 NOTE — Patient Instructions (Signed)
Sodium Ferric Gluconate Complex injection What is this medicine? SODIUM FERRIC GLUCONATE COMPLEX (SOE dee um FER ik GLOO koe nate KOM pleks) is an iron replacement. It is used with epoetin therapy to treat low iron levels in patients who are receiving hemodialysis. This medicine may be used for other purposes; ask your health care provider or pharmacist if you have questions. COMMON BRAND NAME(S): Ferrlecit, Nulecit What should I tell my health care provider before I take this medicine? They need to know if you have any of the following conditions:  anemia that is not from iron deficiency  high levels of iron in the body  an unusual or allergic reaction to iron, benzyl alcohol, other medicines, foods, dyes, or preservatives  pregnant or are trying to become pregnant  breast-feeding How should I use this medicine? This medicine is for infusion into a vein. It is given by a health care professional in a hospital or clinic setting. Talk to your pediatrician regarding the use of this medicine in children. While this drug may be prescribed for children as young as 6 years old for selected conditions, precautions do apply. Overdosage: If you think you have taken too much of this medicine contact a poison control center or emergency room at once. NOTE: This medicine is only for you. Do not share this medicine with others. What if I miss a dose? It is important not to miss your dose. Call your doctor or health care professional if you are unable to keep an appointment. What may interact with this medicine? Do not take this medicine with any of the following medications:  deferoxamine  dimercaprol  other iron products This medicine may also interact with the following medications:  chloramphenicol  deferasirox  medicine for blood pressure like enalapril This list may not describe all possible interactions. Give your health care provider a list of all the medicines, herbs,  non-prescription drugs, or dietary supplements you use. Also tell them if you smoke, drink alcohol, or use illegal drugs. Some items may interact with your medicine. What should I watch for while using this medicine? Your condition will be monitored carefully while you are receiving this medicine. Visit your doctor for check-ups as directed. What side effects may I notice from receiving this medicine? Side effects that you should report to your doctor or health care professional as soon as possible:  allergic reactions like skin rash, itching or hives, swelling of the face, lips, or tongue  breathing problems  changes in hearing  changes in vision  chills, flushing, or sweating  fast, irregular heartbeat  feeling faint or lightheaded, falls  fever, flu-like symptoms  high or low blood pressure  pain, tingling, numbness in the hands or feet  severe pain in the chest, back, flanks, or groin  swelling of the ankles, feet, hands  trouble passing urine or change in the amount of urine  unusually weak or tired Side effects that usually do not require medical attention (report to your doctor or health care professional if they continue or are bothersome):  cramps  dark colored stools  diarrhea  headache  nausea, vomiting  stomach upset This list may not describe all possible side effects. Call your doctor for medical advice about side effects. You may report side effects to FDA at 1-800-FDA-1088. Where should I keep my medicine? This drug is given in a hospital or clinic and will not be stored at home. NOTE: This sheet is a summary. It may not cover all   possible information. If you have questions about this medicine, talk to your doctor, pharmacist, or health care provider.  2020 [employer]vier/Gold Standard (2008-03-09 15:58:57)  

## 2020-07-24 NOTE — ED Triage Notes (Signed)
Pt brought over from cancer center. Pt was receiving an iron infusion today. Pt went to use the bathroom and had a syncopal episode. Cancer center gave a fluid bolus, but patient began vomiting so they brought her over here.

## 2020-07-24 NOTE — ED Provider Notes (Signed)
[facility] DEPT Provider Note   CSN: [id number] Arrival date & time: 07/24/20  1644     History Chief Complaint  Patient presents with   Emesis   Loss of Consciousness    Amber Park is a 87 y.o. female.  HPI      87yo female with history of colon cancer, hypertension, AVM, iron deficiency anemia who was receiving ferric gluconate infusion today when she developed pre-syncope, nausea, vomiting, diarrhea, chills and hypotension.    Was receiving an iron infusion at the cancer center, was 2 hour infusion (typically 1 hour) Then began to have nausea, chest tightness, turned red/white/blood pressures dropped to 84/44 per patient, speech was slurred, then got up to go to bathroom and needed a wheelchair, had 3 episodes of diarrhea-not told they were black or bloody.  Has had colon cancer, not much control of bowels, and has diarrhea about 3 times per week. went three times while there, last 2 were diarrhea. Given immodium, takes immodium AD. Had nausea and emesis.  Abx one month ago, no known sick contacts  Past Medical History:  Diagnosis Date   Anemia    iron deficiency   Anxiety    Arthritis    AVM (arteriovenous malformation)    colon, stomach and bowel ( per progress note)   Cancer (HCC)    hx of skin cancer removed from nose    Family history of adverse reaction to anesthesia    Pt son has PONV   GERD (gastroesophageal reflux disease)    Glaucoma    Hearing aid worn    B/L   History of blood transfusion 1963   History of hiatal hernia    History of left bundle branch block    old ekg 06-23-11 dr Felipa Eth on chart   HOH (hard of hearing)    Hypertension    Pneumonia    hx of pneumonia x 4    PONV (postoperative nausea and vomiting)    Shortness of breath dyspnea    with exertion    Wears glasses     Patient Active Problem List   Diagnosis Date Noted   Hyponatremia 02/19/2020   Hypokalemia 02/19/2020    HTN (hypertension) 02/19/2020   Colon cancer, ascending ([place]) 02/10/2020   Epigastric pain 09/22/2019   Loss of weight 07/22/2019   AVM (arteriovenous malformation) of stomach, acquired 07/22/2019   Abdominal bloating 01/16/2019   AVM (arteriovenous malformation) of colon 01/16/2019   Lower abdominal pain 01/16/2019   [place]on lesion, chronic 01/16/2019   AVM (arteriovenous malformation) of small bowel, acquired 01/16/2019   Hiatal hernia 01/16/2019   Symptomatic anemia 09/14/2018   Overweight (BMI 25.0-29.9) 06/01/2014   S/P left THA, AA 05/31/2014   ANEMIA, IRON DEFICIENCY 05/02/2009   GERD 05/02/2009   PERSONAL HX COLONIC POLYPS 05/02/2009    Past Surgical History:  Procedure Laterality Date   ABDOMINAL HYSTERECTOMY     BACK SURGERY     x 2   BIOPSY  01/03/2020   Procedure: BIOPSY;  Surgeon: Irving Copas., MD;  Location: [facility] ENDOSCOPY;  Service: Gastroenterology;;   BREAST SURGERY     bilateral cyst removal    COLONOSCOPY WITH PROPOFOL N/A 01/03/2020   Procedure: COLONOSCOPY WITH PROPOFOL;  Surgeon: Irving Copas., MD;  Location: [facility] ENDOSCOPY;  Service: Gastroenterology;  Laterality: N/A;   ENTEROSCOPY N/A 09/16/2018   Procedure: ENTEROSCOPY;  Surgeon: Rush Landmark Telford Nab., MD;  Location: [place];  Service: Gastroenterology;  Laterality: N/A;  ENTEROSCOPY N/A 01/03/2020   Procedure: ENTEROSCOPY;  Surgeon: Mansouraty, Telford Nab., MD;  Location: [place];  Service: Gastroenterology;  Laterality: N/A;   HOT HEMOSTASIS N/A 09/16/2018   Procedure: HOT HEMOSTASIS (ARGON PLASMA COAGULATION/BICAP);  Surgeon: Irving Copas., MD;  Location: [place];  Service: Gastroenterology;  Laterality: N/A;   LAPAROSCOPIC PARTIAL COLECTOMY N/A 02/10/2020   Procedure: LAPAROSCOPIC PARTIAL COLECTOMY;  Surgeon: Leighton Ruff, MD;  Location: [facility];  Service: General;  Laterality: N/A;   POLYPECTOMY  01/03/2020   Procedure:  POLYPECTOMY;  Surgeon: Rush Landmark Telford Nab., MD;  Location: [place];  Service: Gastroenterology;;   SUBMUCOSAL TATTOO INJECTION  01/03/2020   Procedure: SUBMUCOSAL TATTOO INJECTION;  Surgeon: Irving Copas., MD;  Location: [place];  Service: Gastroenterology;;   TONSILLECTOMY     TOTAL HIP ARTHROPLASTY Left 05/31/2014   Procedure: LEFT TOTAL HIP ARTHROPLASTY ANTERIOR APPROACH;  Surgeon: Mauri Pole, MD;  Location: [facility];  Service: Orthopedics;  Laterality: Left;     OB History   No obstetric history on file.     Family History  Problem Relation Age of Onset   Colon cancer Mother 21       Died age 37   Heart attack Father 43       Died in his sleep of an MI   Lung cancer Sister        LLL resection, otherwise no treatment; now recurred   Bladder Cancer Sister        Likely primary   Renal cancer Sister    Breast cancer Sister    Heart disease Brother    Heart disease Brother    Esophageal cancer Neg Hx    Inflammatory bowel disease Neg Hx    Liver disease Neg Hx    Pancreatic cancer Neg Hx    Stomach cancer Neg Hx     Social History   Tobacco Use   Smoking status: Former Smoker    Packs/day: 0.30    Types: Cigarettes    Start date: 1955    Quit date: 2005    Years since quitting: 17.0   Smokeless tobacco: Never Used  Scientific laboratory technician Use: Never used  Substance Use Topics   Alcohol use: Not Currently    Comment: rare    Drug use: No    Home Medications Prior to Admission medications   Medication Sig Start Date End Date Taking? Authorizing Provider  acetaminophen (TYLENOL) 500 MG tablet Take 500 mg by mouth at bedtime.   Yes [provider]  amLODipine (NORVASC) 10 MG tablet Take 10 mg by mouth daily. 08/28/18  Yes [provider]  Cholecalciferol (VITAMIN D3) 50 MCG (2000 UT) capsule Take 2,000 Units by mouth daily.   Yes [provider]  hydrochlorothiazide (HYDRODIURIL) 25 MG tablet  Take 25 mg by mouth daily. 06/15/19  Yes [provider]  hydrocortisone cream 1 % Apply 1 application topically daily as needed for itching.   Yes [provider]  latanoprost (XALATAN) 0.005 % ophthalmic solution Place 1 drop into both eyes at bedtime.  09/11/18  Yes [provider]  ondansetron (ZOFRAN) 4 MG tablet Take 1 tablet (4 mg total) by mouth every 6 (six) hours as needed for nausea or vomiting. 07/24/20  Yes Gareth Morgan, MD  pantoprazole (PROTONIX) 40 MG tablet TAKE 1 TABLET(40 MG) BY MOUTH TWICE DAILY BEFORE A MEAL Patient taking differently: Take 40 mg by mouth daily. 05/01/20  Yes Mansouraty, Telford Nab.,  MD  potassium chloride (KLOR-CON) 10 MEQ tablet Take 10 mEq by mouth daily.   Yes [provider]  tiZANidine (ZANAFLEX) 4 MG tablet Take 1 tablet (4 mg total) by mouth every 8 (eight) hours as needed for muscle spasms. Patient taking differently: Take 4 mg by mouth at bedtime. 06/02/14  Yes Babish, Rodman Key, PA-C    Allergies    Anesthesia s-i-40 [propofol], Codeine, Meperidine hcl, Pneumococcal vac polyvalent, and Simvastatin  Review of Systems   Review of Systems  Constitutional: Positive for chills and fatigue. Negative for fever.  HENT: Negative for sore throat.   Eyes: Negative for visual disturbance.  Respiratory: Negative for cough and shortness of breath.   Cardiovascular: Positive for chest pain (tightness).  Gastrointestinal: Positive for diarrhea, nausea and vomiting. Negative for abdominal pain and blood in stool.  Genitourinary: Negative for difficulty urinating.  Musculoskeletal: Negative for neck pain.  Skin: Negative for rash.  Neurological: Positive for light-headedness. Negative for syncope and headaches.    Physical Exam Updated Vital Signs BP (!) 157/66    Pulse 80    Temp (!) 97.5 F (36.4 C) (Oral)    Resp 17    SpO2 97%   Physical Exam Vitals and nursing note reviewed.  Constitutional:      General: She  is not in acute distress.    Appearance: She is well-developed and well-nourished. She is not diaphoretic.  HENT:     Head: Normocephalic and atraumatic.  Eyes:     Extraocular Movements: EOM normal.     Conjunctiva/sclera: Conjunctivae normal.  Cardiovascular:     Rate and Rhythm: Normal rate and regular rhythm.     Pulses: Intact distal pulses.     Heart sounds: Normal heart sounds. No murmur heard. No friction rub. No gallop.   Pulmonary:     Effort: Pulmonary effort is normal. No respiratory distress.     Breath sounds: Normal breath sounds. No wheezing or rales.  Abdominal:     General: There is no distension.     Palpations: Abdomen is soft.     Tenderness: There is no abdominal tenderness. There is no guarding.  Musculoskeletal:        General: No tenderness or edema.     Cervical back: Normal range of motion.  Skin:    General: Skin is warm and dry.     Findings: No erythema or rash.  Neurological:     Mental Status: She is alert and oriented to person, place, and time.     ED Results / Procedures / Treatments   Labs (all labs ordered are listed, but only abnormal results are displayed) Labs Reviewed  BASIC METABOLIC PANEL - Abnormal; Notable for the following components:      Result Value   Potassium 3.1 (*)    Glucose, Bld 134 (*)    BUN 31 (*)    Creatinine, Ser 1.33 (*)    Calcium 8.7 (*)    GFR, Estimated 37 (*)    All other components within normal limits  CBC - Abnormal; Notable for the following components:   WBC 25.4 (*)    RBC 6.04 (*)    MCV 73.3 (*)    MCH 21.5 (*)    MCHC 29.3 (*)    RDW 17.7 (*)    Platelets 432 (*)    All other components within normal limits  CBG MONITORING, ED - Abnormal; Notable for the following components:   Glucose-Capillary 120 (*)  All other components within normal limits  TROPONIN I (HIGH SENSITIVITY) - Abnormal; Notable for the following components:   Troponin I (High Sensitivity) 23 (*)    All other  components within normal limits  TROPONIN I (HIGH SENSITIVITY) - Abnormal; Notable for the following components:   Troponin I (High Sensitivity) 24 (*)    All other components within normal limits  RESP PANEL BY RT-PCR (FLU A&B, COVID) ARPGX2  CULTURE, BLOOD (ROUTINE X 2)  CULTURE, BLOOD (ROUTINE X 2)  URINE CULTURE  URINALYSIS, ROUTINE W REFLEX MICROSCOPIC  LACTIC ACID, PLASMA  HEPATIC FUNCTION PANEL  TROPONIN I (HIGH SENSITIVITY)    EKG None  Radiology DG Chest Portable 1 View  Result Date: 07/24/2020 CLINICAL DATA:  Syncope. EXAM: PORTABLE CHEST 1 VIEW COMPARISON:  CT chest dated January 11, 2020. Chest x-ray dated 09/14/2018 FINDINGS: There is increased density overlying the bilateral lower lung zones which is favored to be secondary to breast tissue attenuation artifact as opposed to bilateral pleural effusions. There is a hiatal hernia. There is no pneumothorax or large focal infiltrate. Aortic calcifications are noted. The heart size is unremarkable. IMPRESSION: No active disease. Electronically Signed   By: Constance Holster M.D.   On: 07/24/2020 19:53    Procedures Procedures (including critical care time)  Medications Ordered in ED Medications  potassium chloride SA (KLOR-CON) CR tablet 40 mEq (40 mEq Oral Given 07/24/20 2128)  sodium chloride 0.9 % bolus 500 mL (0 mLs Intravenous Stopped 07/24/20 2329)    ED Course  I have reviewed the triage vital signs and the nursing notes.  Pertinent labs & imaging results that were available during my care of the patient were reviewed by me and considered in my medical decision making (see chart for details).    MDM Rules/Calculators/A&P                          87yo female with history of colon cancer, hypertension, AVM, iron deficiency anemia who was receiving ferric gluconate infusion today when she developed pre-syncope, nausea, vomiting, diarrhea, chills and hypotension.    DDx includes infusion reaction, anaphylaxis, viral  infection including COVID 19, C Difficile, UTI, ACS.  No dyspnea, doubt PE.  Blood pressures low at infusion center however rapidly improved with fluid and remain normal--elevated in the ED.    Labs show mild AKI, possible dehydration or prerenal from brief hypotension.  No anemia today, denies known black or bloody stools.  EKG evaluated by me without acute abnormalities, troponin 23 and on repeat similar.   UA without signs of infection. COVID testing negative.  Blood cultures ordered given leukocytosis of 57[address].  Consider anaphylaxis, however symptoms improved without epinephrine/benadryl etc, no rash. Possible other infusion reaction. Unclear etiology of [address] WBC, CDiff seems less likely given no persistent diarrhea, no clear source of infection by hx, blood cx pending.   Suspect likely infusion reaction given timing and transient nature of symptoms. Recommend avoiding Fe infusion at this time and discussing more with physician.  Patient discharged in stable condition with understanding of reasons to return.    Final Clinical Impression(s) / ED Diagnoses Final diagnoses:  Dehydration  Infusion reaction, initial encounter  Leukocytosis, unspecified type  Nausea and vomiting, intractability of vomiting not specified, unspecified vomiting type    Rx / DC Orders ED Discharge Orders         Ordered    ondansetron (ZOFRAN) 4 MG tablet  Every 6 hours  PRN        07/24/20 2340           Alvira Monday, MD 07/25/20 1120

## 2020-07-24 NOTE — Progress Notes (Signed)
[facility] Progress Note   Amber Park [id number] May 11, 1925 87 y.o.  Amber Park is managed by Dr. Dominica Severin B. Sherrill  Actively treated with chemotherapy/immunotherapy/hormonal therapy: no  Current therapy: IV iron (ferric gluconate)  Next scheduled appointment with provider: 09/08/2020  Assessment: Plan:    Iron deficiency anemia, unspecified iron deficiency anemia type   Iron deficiency anemia: Amber Park received approximately one half of her dose of ferric gluconate today.  She is scheduled to return in 1 week for an additional dose of ferrous gluconate and then is to see Dr. Dominica Severin B. Sherrill in follow-up on 09/08/2020.  Presyncope, hypotension, nausea and vomiting, and chills: The patient's hypotension initially improved after she was given a bolus of normal saline however she then developed nausea and vomiting and chills.  She was taken to the emergency room for evaluation and management.  Please see After Visit Summary for patient specific instructions.  Future Appointments  Date Time Provider [place]  07/31/2020  3:00 PM [facility] INFUSION [facility] None  09/08/2020 11:30 AM [facility] LAB [facility] None  09/08/2020 12:00 PM Ladell Pier, MD [facility] None    No orders of the defined types were placed in this encounter.      Subjective:   Patient ID:  Amber Park is a 87 y.o. (DOB Aug 28, 1924) female.  Chief Complaint: No chief complaint on file.   HPI Amber Park  is a 87 y.o. female with a diagnosis of an iron deficiency anemia.  She was seen in the infusion room today as she was receiving ferric gluconate.  She received approximately one half of her prescribed dose of ferric gluconate today when she got up to go to the bathroom and had a presyncopal episode with her blood pressure dropping to 92/49.  According to the staff it seemed that the patient had some slurred speech.  She was evaluated and was found to have  good bilateral grip strength with no appreciable weakness.  She did not appear to have slurred speech at that time.  The ER was contacted however there was approximately 20 to 30 minutes before room was available.  During this time the patient was given a fluid bolus of normal saline and Pepcid 20 mg IV x1 with a restart of her ferric gluconate.  She subsequently developed diarrhea.  She was given 1 tablet of Lomotil.  She requested that the remainder of her ferric gluconate was held today.  She was in the process of being taken to the front of the cancer center for her ride when she developed nausea and vomiting and chills. Of note, the patient walked in to her appointment today without assistance. She was taken to the emergency room for evaluation and management.  She had no labs collected today.   Medications: I have reviewed the patient's current medications.  Allergies:  Allergies  Allergen Reactions  . Anesthesia S-I-40 [Propofol] Nausea And Vomiting    Anesthetics -- N/V  . Codeine Nausea And Vomiting  . Meperidine Hcl Nausea And Vomiting    Past Medical History:  Diagnosis Date  . Anemia    iron deficiency  . Anxiety   . Arthritis   . AVM (arteriovenous malformation)    colon, stomach and bowel ( per progress note)  . Cancer (HCC)    hx of skin cancer removed from nose   . Family history of adverse reaction to anesthesia    Pt son has PONV  . GERD (  gastroesophageal reflux disease)   . Glaucoma   . Hearing aid worn    B/L  . History of blood transfusion 1963  . History of hiatal hernia   . History of left bundle branch block    old ekg 06-23-11 dr Felipa Eth on chart  . HOH (hard of hearing)   . Hypertension   . Pneumonia    hx of pneumonia x 4   . PONV (postoperative nausea and vomiting)   . Shortness of breath dyspnea    with exertion   . Wears glasses     Past Surgical History:  Procedure Laterality Date  . ABDOMINAL HYSTERECTOMY    . BACK SURGERY     x 2  .  BIOPSY  01/03/2020   Procedure: BIOPSY;  Surgeon: Rush Landmark Telford Nab., MD;  Location: [place];  Service: Gastroenterology;;  . BREAST SURGERY     bilateral cyst removal   . COLONOSCOPY WITH PROPOFOL N/A 01/03/2020   Procedure: COLONOSCOPY WITH PROPOFOL;  Surgeon: Rush Landmark Telford Nab., MD;  Location: [place];  Service: Gastroenterology;  Laterality: N/A;  . ENTEROSCOPY N/A 09/16/2018   Procedure: ENTEROSCOPY;  Surgeon: Rush Landmark Telford Nab., MD;  Location: [facility] ENDOSCOPY;  Service: Gastroenterology;  Laterality: N/A;  . ENTEROSCOPY N/A 01/03/2020   Procedure: ENTEROSCOPY;  Surgeon: Rush Landmark Telford Nab., MD;  Location: [facility] ENDOSCOPY;  Service: Gastroenterology;  Laterality: N/A;  . HOT HEMOSTASIS N/A 09/16/2018   Procedure: HOT HEMOSTASIS (ARGON PLASMA COAGULATION/BICAP);  Surgeon: Irving Copas., MD;  Location: [place];  Service: Gastroenterology;  Laterality: N/A;  . LAPAROSCOPIC PARTIAL COLECTOMY N/A 02/10/2020   Procedure: LAPAROSCOPIC PARTIAL COLECTOMY;  Surgeon: Leighton Ruff, MD;  Location: [facility];  Service: General;  Laterality: N/A;  . POLYPECTOMY  01/03/2020   Procedure: POLYPECTOMY;  Surgeon: Irving Copas., MD;  Location: [place];  Service: Gastroenterology;;  . Lia Foyer TATTOO INJECTION  01/03/2020   Procedure: SUBMUCOSAL TATTOO INJECTION;  Surgeon: Irving Copas., MD;  Location: [place];  Service: Gastroenterology;;  . TONSILLECTOMY    . TOTAL HIP ARTHROPLASTY Left 05/31/2014   Procedure: LEFT TOTAL HIP ARTHROPLASTY ANTERIOR APPROACH;  Surgeon: Mauri Pole, MD;  Location: [facility];  Service: Orthopedics;  Laterality: Left;    Family History  Problem Relation Age of Onset  . Colon cancer Mother 2       Died age 52  . Heart attack Father 58       Died in his sleep of an MI  . Lung cancer Sister        LLL resection, otherwise no treatment; now recurred  . Bladder Cancer Sister        Likely primary  . Renal cancer  Sister   . Breast cancer Sister   . Heart disease Brother   . Heart disease Brother   . Esophageal cancer Neg Hx   . Inflammatory bowel disease Neg Hx   . Liver disease Neg Hx   . Pancreatic cancer Neg Hx   . Stomach cancer Neg Hx     Social History   Socioeconomic History  . Marital status: Widowed    Spouse name: Not on file  . Number of children: 3  . Years of education: Not on file  . Highest education level: Not on file  Occupational History  . Occupation: Retired Designer, multimedia at [employer]  . Smoking status: Former Smoker    Packs/day: 0.30    Types: Cigarettes    Start date:  72    Quit date: 2005    Years since quitting: 17.0  . Smokeless tobacco: Never Used  Vaping Use  . Vaping Use: Never used  Substance and Sexual Activity  . Alcohol use: Not Currently    Comment: rare   . Drug use: No  . Sexual activity: Not on file  Other Topics Concern  . Not on file  Social History Narrative  . Not on file   Social Determinants of Health   Financial Resource Strain: Not on file  Food Insecurity: Not on file  Transportation Needs: Not on file  Physical Activity: Not on file  Stress: Not on file  Social Connections: Not on file  Intimate Partner Violence: Not on file    Past Medical History, Surgical history, Social history, and Family history were reviewed and updated as appropriate.   Please see review of systems for further details on the patient's review from today.   Review of Systems:  Review of Systems  Constitutional: Positive for chills. Negative for diaphoresis and fever.  HENT: Negative for trouble swallowing and voice change.   Respiratory: Negative for cough, chest tightness, shortness of breath and wheezing.   Cardiovascular: Negative for chest pain and palpitations.  Gastrointestinal: Positive for diarrhea, nausea and vomiting. Negative for abdominal pain and constipation.  Musculoskeletal: Negative for back pain and  myalgias.  Neurological: Positive for dizziness and light-headedness. Negative for headaches.       Presyncope    Objective:   Physical Exam:  There were no vitals taken for this visit. ECOG: 1  BP:  92/49 P: 85 SP02: 96% on room air  Physical Exam Constitutional:      General: She is not in acute distress.    Appearance: She is not diaphoretic.  HENT:     Head: Normocephalic and atraumatic.  Cardiovascular:     Rate and Rhythm: Normal rate and regular rhythm.     Heart sounds: Normal heart sounds. No murmur heard. No friction rub. No gallop.   Pulmonary:     Effort: Pulmonary effort is normal. No respiratory distress.     Breath sounds: Normal breath sounds. No wheezing or rales.  Skin:    General: Skin is warm and dry.     Findings: No erythema or rash.  Neurological:     Mental Status: She is alert.     Motor: No weakness.     Gait: Gait abnormal.     Comments: Bilateral upper extremity grip strength 5/5     Lab Review:     Component Value Date/Time   NA 131 (L) 02/13/2020 0603   K 3.2 (L) 02/13/2020 0603   CL 91 (L) 02/13/2020 0603   CO2 31 02/13/2020 0603   GLUCOSE 95 02/13/2020 0603   BUN 15 02/13/2020 0603   CREATININE 0.96 02/13/2020 0603   CALCIUM 9.1 02/13/2020 0603   PROT 5.8 (L) 01/03/2020 1300   ALBUMIN 3.1 (L) 01/03/2020 1300   AST 24 01/03/2020 1300   ALT 13 01/03/2020 1300   ALKPHOS 72 01/03/2020 1300   BILITOT 0.5 01/03/2020 1300   GFRNONAA 51 (L) 02/13/2020 0603   GFRAA 60 (L) 02/13/2020 0603       Component Value Date/Time   WBC 7.3 07/07/2020 1422   RBC 4.86 07/07/2020 1422   HGB 10.4 (L) 07/07/2020 1422   HGB 10.2 (L) 03/07/2020 1618   HCT 34.5 (L) 07/07/2020 1422   PLT 350 07/07/2020 1422   PLT 401 (H) 03/07/2020  1618   MCV 71.0 (L) 07/07/2020 1422   MCH 21.4 (L) 07/07/2020 1422   MCHC 30.1 07/07/2020 1422   RDW 15.9 (H) 07/07/2020 1422   LYMPHSABS 1.5 07/07/2020 1422   MONOABS 0.6 07/07/2020 1422   EOSABS 0.1 07/07/2020  1422   BASOSABS 0.1 07/07/2020 1422   -------------------------------  Imaging from last 24 hours (if applicable):  Radiology interpretation: No results found.      This case was discussed with Dr. Truett Perna. He expressed agreement with my management of this patient.

## 2020-07-25 ENCOUNTER — Telehealth: Payer: Self-pay | Admitting: *Deleted

## 2020-07-25 NOTE — Telephone Encounter (Signed)
Patient wishes to discuss any future iron infusion with Dr. Truett Perna before proceeding with next one on 07/31/20. Asking if it was a "reaction" to the iron, or the length of the infusion. Is concerned.

## 2020-07-26 LAB — URINE CULTURE: Culture: 10000 — AB

## 2020-07-26 NOTE — Telephone Encounter (Signed)
Per Dr. Truett Perna ,can have in person visit or telehealth visit on 1/7 at 10:15 or 1/6 at 0930 or 1030. Shd reports she does not do the telehealth visits and would like to discuss with MD in person. She will come on 1/17 at 10:15.

## 2020-07-28 ENCOUNTER — Inpatient Hospital Stay: Payer: Medicare Other | Admitting: Nurse Practitioner

## 2020-07-28 ENCOUNTER — Other Ambulatory Visit: Payer: Self-pay

## 2020-07-28 ENCOUNTER — Telehealth: Payer: Self-pay | Admitting: Nurse Practitioner

## 2020-07-28 ENCOUNTER — Encounter: Payer: Self-pay | Admitting: Nurse Practitioner

## 2020-07-28 VITALS — BP 163/63 | HR 90 | Temp 97.8°F | Resp 20 | Ht 61.0 in | Wt 138.0 lb

## 2020-07-28 DIAGNOSIS — R112 Nausea with vomiting, unspecified: Secondary | ICD-10-CM | POA: Diagnosis not present

## 2020-07-28 DIAGNOSIS — R55 Syncope and collapse: Secondary | ICD-10-CM | POA: Diagnosis not present

## 2020-07-28 DIAGNOSIS — R42 Dizziness and giddiness: Secondary | ICD-10-CM | POA: Diagnosis not present

## 2020-07-28 DIAGNOSIS — I950 Idiopathic hypotension: Secondary | ICD-10-CM | POA: Diagnosis not present

## 2020-07-28 DIAGNOSIS — D509 Iron deficiency anemia, unspecified: Secondary | ICD-10-CM | POA: Diagnosis not present

## 2020-07-28 DIAGNOSIS — R6883 Chills (without fever): Secondary | ICD-10-CM | POA: Diagnosis not present

## 2020-07-28 NOTE — Progress Notes (Addendum)
  [facility] OFFICE PROGRESS NOTE   Diagnosis:  Colon cancer  INTERVAL HISTORY:   Amber Park returns for follow-up.  She received a single dose of ferrous gluconate on 07/24/2020.  About halfway through the infusion she reports developing back and chest pain, diarrhea, nausea/vomiting and chills.  She became hypotensive.  The hypotension improved initially with a normal saline bolus.  She was taken to the emergency department.  Infusion reaction suspected given timing and transient nature of symptoms.  She did not require epinephrine/Benadryl.  She was discharged home.  She reports she is unable to tolerate oral iron due to a "stomachache and poor appetite".  Energy level remains poor.  She is not aware of bleeding.  No nausea or vomiting.  Objective:  Vital signs in last 24 hours:  Blood pressure (!) 163/63, pulse 90, temperature 97.8 F (36.6 C), temperature source Tympanic, resp. rate 20, height $RemoveBe'5\' 1"'daEPyYsoq$  (1.549 m), weight 138 lb (62.6 kg), SpO2 98 %.     Resp: Lungs clear bilaterally. Cardio: Regular rate and rhythm. GI: Abdomen soft and nontender.  No hepatomegaly. Vascular: No leg edema.   Lab Results:  Lab Results  Component Value Date   WBC 25.4 (H) 07/24/2020   HGB 13.0 07/24/2020   HCT 44.3 07/24/2020   MCV 73.3 (L) 07/24/2020   PLT 432 (H) 07/24/2020   NEUTROABS 5.1 07/07/2020    Imaging:  No results found.  Medications: I have reviewed the patient's current medications.  Assessment/Plan: 1. Colon cancer-stage IIIb ? Cecal mass on colonoscopy 01/03/2020, biopsy confirmed adenocarcinoma ? CTs 01/11/2020-cecal mass, prominent subcentimeter mesenteric nodes adjacent to the cecum, multiple tiny pulmonary nodules-metastases not favored ? Laparoscopic right colectomy 02/10/2020,pT3pN1a, grade 2-grade 3, 1/27 lymph nodes, 1 tumor deposit, MSI-high, loss of MLH1 and PMS 2 expression; MLH1 methylation detected 2. 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 and  history of AVMs/Cameron's erosions  Ferrous gluconate 07/24/2020-allergic/infusion reaction with chest and back pain, diarrhea, nausea/vomiting, chills, presyncope, hypotension 3. Hiatal hernia 4. Family history of colon and bladder cancer 5. Transverse colon tubular adenoma on the colonoscopy 01/03/2020 6. Hypertension 7. Glaucoma   Disposition: Amber Park appears stable.  She had a significant reaction to IV ferrous gluconate requiring evaluation in the emergency department.  We have listed ferrous gluconate as an allergy.  She is unable to tolerate oral iron.  She has had Feraheme multiple times in the past and tolerated well.  She would like to proceed with Feraheme.  We will submit an appeal to her insurance company.  She understands an allergic reaction is possible with Feraheme as well.  She is willing to accept this risk.  She will be premedicated with Tylenol, Benadryl and Pepcid.  We have tentatively scheduled IV Feraheme 08/07/2020 and 08/14/2020.  She has routine follow-up scheduled 09/08/2020.  Patient seen with Dr. Benay Spice.    Ned Card ANP/GNP-BC   07/28/2020  10:40 AM This was a shared visit with Ned Card.  Amber Park had an apparent allergic reaction to ferrous gluconate on 07/24/2020.  She appears well today.  She says that she is unable to tolerate oral iron.  She has received IV Feraheme in the past without an allergic reaction.  We will request insurance approval for Feraheme.  Julieanne Manson, MD

## 2020-07-28 NOTE — Telephone Encounter (Signed)
Scheduled appointments per 1/7 los. Spoke to patient who is aware of appointments dates and times.  

## 2020-07-30 LAB — CULTURE, BLOOD (ROUTINE X 2)
Culture: NO GROWTH
Special Requests: ADEQUATE

## 2020-07-31 ENCOUNTER — Ambulatory Visit: Payer: Medicare Other

## 2020-07-31 ENCOUNTER — Telehealth: Payer: Self-pay | Admitting: *Deleted

## 2020-07-31 NOTE — Telephone Encounter (Signed)
Daughter called [employer] and she spoke w/Andrea in managed care today. New case has been opened for the administration of Feraheme since she had allergic reaction to ferrous gluconate. If it is denied, then a peer to peer will need to occur.

## 2020-08-03 ENCOUNTER — Telehealth: Payer: Self-pay | Admitting: *Deleted

## 2020-08-03 NOTE — Telephone Encounter (Signed)
Patient called and asked what to do about her iron infusion on 1/17 with the weather forecast? Called back and left VM to call us that day if she is not coming and we will cancel at that time. Will leave as is for now. Do not worry about a charge if there is < 24 notice for appointment-that is not done here.

## 2020-08-07 ENCOUNTER — Telehealth: Payer: Self-pay | Admitting: *Deleted

## 2020-08-07 ENCOUNTER — Inpatient Hospital Stay: Payer: Medicare Other

## 2020-08-07 NOTE — Telephone Encounter (Signed)
PC to patient asking if she will be keeping her appointment today, she is not coming, states she will reschedule.

## 2020-08-14 ENCOUNTER — Other Ambulatory Visit: Payer: Self-pay

## 2020-08-14 ENCOUNTER — Inpatient Hospital Stay: Payer: Medicare Other

## 2020-08-14 VITALS — BP 160/62 | HR 74 | Temp 97.7°F | Resp 20

## 2020-08-14 DIAGNOSIS — I950 Idiopathic hypotension: Secondary | ICD-10-CM | POA: Diagnosis not present

## 2020-08-14 DIAGNOSIS — R42 Dizziness and giddiness: Secondary | ICD-10-CM | POA: Diagnosis not present

## 2020-08-14 DIAGNOSIS — D500 Iron deficiency anemia secondary to blood loss (chronic): Secondary | ICD-10-CM

## 2020-08-14 DIAGNOSIS — R6883 Chills (without fever): Secondary | ICD-10-CM | POA: Diagnosis not present

## 2020-08-14 DIAGNOSIS — R112 Nausea with vomiting, unspecified: Secondary | ICD-10-CM | POA: Diagnosis not present

## 2020-08-14 DIAGNOSIS — R55 Syncope and collapse: Secondary | ICD-10-CM | POA: Diagnosis not present

## 2020-08-14 DIAGNOSIS — D509 Iron deficiency anemia, unspecified: Secondary | ICD-10-CM

## 2020-08-14 LAB — CBC WITH DIFFERENTIAL (CANCER CENTER ONLY)
Abs Immature Granulocytes: 0.02 10*3/uL (ref 0.00–0.07)
Basophils Absolute: 0.1 10*3/uL (ref 0.0–0.1)
Basophils Relative: 1 %
Eosinophils Absolute: 0.1 10*3/uL (ref 0.0–0.5)
Eosinophils Relative: 1 %
HCT: 41.3 % (ref 36.0–46.0)
Hemoglobin: 12.4 g/dL (ref 12.0–15.0)
Immature Granulocytes: 0 %
Lymphocytes Relative: 15 %
Lymphs Abs: 1.3 10*3/uL (ref 0.7–4.0)
MCH: 22.3 pg — ABNORMAL LOW (ref 26.0–34.0)
MCHC: 30 g/dL (ref 30.0–36.0)
MCV: 74.3 fL — ABNORMAL LOW (ref 80.0–100.0)
Monocytes Absolute: 0.5 10*3/uL (ref 0.1–1.0)
Monocytes Relative: 5 %
Neutro Abs: 6.7 10*3/uL (ref 1.7–7.7)
Neutrophils Relative %: 78 %
Platelet Count: 337 10*3/uL (ref 150–400)
RBC: 5.56 MIL/uL — ABNORMAL HIGH (ref 3.87–5.11)
RDW: 18.6 % — ABNORMAL HIGH (ref 11.5–15.5)
WBC Count: 8.7 10*3/uL (ref 4.0–10.5)
nRBC: 0 % (ref 0.0–0.2)

## 2020-08-14 LAB — FERRITIN: Ferritin: 29 ng/mL (ref 11–307)

## 2020-08-14 MED ORDER — SODIUM CHLORIDE 0.9 % IV SOLN
Freq: Once | INTRAVENOUS | Status: AC
  Filled 2020-08-14: qty 250

## 2020-08-14 MED ORDER — DIPHENHYDRAMINE HCL 25 MG PO CAPS
ORAL_CAPSULE | ORAL | Status: AC
  Filled 2020-08-14: qty 1

## 2020-08-14 MED ORDER — SODIUM CHLORIDE 0.9 % IV SOLN
510.0000 mg | Freq: Once | INTRAVENOUS | Status: AC
  Administered 2020-08-14: 510 mg via INTRAVENOUS
  Filled 2020-08-14: qty 510

## 2020-08-14 MED ORDER — ACETAMINOPHEN 325 MG PO TABS
ORAL_TABLET | ORAL | Status: AC
  Filled 2020-08-14: qty 2

## 2020-08-14 MED ORDER — FAMOTIDINE 20 MG PO TABS
ORAL_TABLET | ORAL | Status: AC
  Filled 2020-08-14: qty 1

## 2020-08-14 MED ORDER — ACETAMINOPHEN 325 MG PO TABS
650.0000 mg | ORAL_TABLET | Freq: Once | ORAL | Status: AC
  Administered 2020-08-14: 650 mg via ORAL

## 2020-08-14 MED ORDER — FAMOTIDINE 20 MG PO TABS
20.0000 mg | ORAL_TABLET | Freq: Once | ORAL | Status: AC
  Administered 2020-08-14: 20 mg via ORAL

## 2020-08-14 MED ORDER — DIPHENHYDRAMINE HCL 25 MG PO CAPS
25.0000 mg | ORAL_CAPSULE | Freq: Once | ORAL | Status: AC
  Administered 2020-08-14: 25 mg via ORAL

## 2020-08-14 NOTE — Patient Instructions (Signed)

## 2020-08-21 ENCOUNTER — Inpatient Hospital Stay: Payer: Medicare Other

## 2020-08-21 ENCOUNTER — Other Ambulatory Visit: Payer: Self-pay

## 2020-08-21 VITALS — BP 151/56 | HR 77 | Temp 97.6°F | Resp 20

## 2020-08-21 DIAGNOSIS — R55 Syncope and collapse: Secondary | ICD-10-CM | POA: Diagnosis not present

## 2020-08-21 DIAGNOSIS — D500 Iron deficiency anemia secondary to blood loss (chronic): Secondary | ICD-10-CM

## 2020-08-21 DIAGNOSIS — R112 Nausea with vomiting, unspecified: Secondary | ICD-10-CM | POA: Diagnosis not present

## 2020-08-21 DIAGNOSIS — R42 Dizziness and giddiness: Secondary | ICD-10-CM | POA: Diagnosis not present

## 2020-08-21 DIAGNOSIS — I950 Idiopathic hypotension: Secondary | ICD-10-CM | POA: Diagnosis not present

## 2020-08-21 DIAGNOSIS — D509 Iron deficiency anemia, unspecified: Secondary | ICD-10-CM | POA: Diagnosis not present

## 2020-08-21 DIAGNOSIS — R6883 Chills (without fever): Secondary | ICD-10-CM | POA: Diagnosis not present

## 2020-08-21 MED ORDER — SODIUM CHLORIDE 0.9 % IV SOLN
510.0000 mg | Freq: Once | INTRAVENOUS | Status: AC
  Administered 2020-08-21: 510 mg via INTRAVENOUS
  Filled 2020-08-21: qty 510

## 2020-08-21 MED ORDER — DIPHENHYDRAMINE HCL 25 MG PO CAPS
25.0000 mg | ORAL_CAPSULE | Freq: Once | ORAL | Status: AC
  Administered 2020-08-21: 25 mg via ORAL

## 2020-08-21 MED ORDER — FAMOTIDINE 20 MG PO TABS
ORAL_TABLET | ORAL | Status: AC
  Filled 2020-08-21: qty 1

## 2020-08-21 MED ORDER — FAMOTIDINE 20 MG PO TABS
20.0000 mg | ORAL_TABLET | Freq: Once | ORAL | Status: AC
  Administered 2020-08-21: 20 mg via ORAL

## 2020-08-21 MED ORDER — ACETAMINOPHEN 325 MG PO TABS
ORAL_TABLET | ORAL | Status: AC
  Filled 2020-08-21: qty 2

## 2020-08-21 MED ORDER — ACETAMINOPHEN 325 MG PO TABS
650.0000 mg | ORAL_TABLET | Freq: Once | ORAL | Status: AC
  Administered 2020-08-21: 650 mg via ORAL

## 2020-08-21 MED ORDER — DIPHENHYDRAMINE HCL 25 MG PO CAPS
ORAL_CAPSULE | ORAL | Status: AC
  Filled 2020-08-21: qty 1

## 2020-08-21 MED ORDER — SODIUM CHLORIDE 0.9 % IV SOLN
Freq: Once | INTRAVENOUS | Status: AC
  Filled 2020-08-21: qty 250

## 2020-08-21 NOTE — Patient Instructions (Signed)
Ferumoxytol injection What is this medicine? FERUMOXYTOL is an iron complex. Iron is used to make healthy red blood cells, which carry oxygen and nutrients throughout the body. This medicine is used to treat iron deficiency anemia. This medicine may be used for other purposes; ask your health care provider or pharmacist if you have questions. COMMON BRAND NAME(S): Feraheme What should I tell my health care provider before I take this medicine? They need to know if you have any of these conditions:  anemia not caused by low iron levels  high levels of iron in the blood  magnetic resonance imaging (MRI) test scheduled  an unusual or allergic reaction to iron, other medicines, foods, dyes, or preservatives  pregnant or trying to get pregnant  breast-feeding How should I use this medicine? This medicine is for injection into a vein. It is given by a health care professional in a hospital or clinic setting. Talk to your pediatrician regarding the use of this medicine in children. Special care may be needed. Overdosage: If you think you have taken too much of this medicine contact a poison control center or emergency room at once. NOTE: This medicine is only for you. Do not share this medicine with others. What if I miss a dose? It is important not to miss your dose. Call your doctor or health care professional if you are unable to keep an appointment. What may interact with this medicine? This medicine may interact with the following medications:  other iron products This list may not describe all possible interactions. Give your health care provider a list of all the medicines, herbs, non-prescription drugs, or dietary supplements you use. Also tell them if you smoke, drink alcohol, or use illegal drugs. Some items may interact with your medicine. What should I watch for while using this medicine? Visit your doctor or healthcare professional regularly. Tell your doctor or healthcare  professional if your symptoms do not start to get better or if they get worse. You may need blood work done while you are taking this medicine. You may need to follow a special diet. Talk to your doctor. Foods that contain iron include: whole grains/cereals, dried fruits, beans, or peas, leafy green vegetables, and organ meats (liver, kidney). What side effects may I notice from receiving this medicine? Side effects that you should report to your doctor or health care professional as soon as possible:  allergic reactions like skin rash, itching or hives, swelling of the face, lips, or tongue  breathing problems  changes in blood pressure  feeling faint or lightheaded, falls  fever or chills  flushing, sweating, or hot feelings  swelling of the ankles or feet Side effects that usually do not require medical attention (report to your doctor or health care professional if they continue or are bothersome):  diarrhea  headache  nausea, vomiting  stomach pain This list may not describe all possible side effects. Call your doctor for medical advice about side effects. You may report side effects to FDA at 1-800-FDA-1088. Where should I keep my medicine? This drug is given in a hospital or clinic and will not be stored at home. NOTE: This sheet is a summary. It may not cover all possible information. If you have questions about this medicine, talk to your doctor, pharmacist, or health care provider.  2021 Elsevier/Gold Standard (2016-08-26 20:21:10)  

## 2020-09-08 ENCOUNTER — Other Ambulatory Visit: Payer: Self-pay

## 2020-09-08 ENCOUNTER — Inpatient Hospital Stay: Payer: Medicare Other

## 2020-09-08 ENCOUNTER — Telehealth: Payer: Self-pay | Admitting: Oncology

## 2020-09-08 ENCOUNTER — Inpatient Hospital Stay: Payer: Medicare Other | Attending: Nurse Practitioner | Admitting: Oncology

## 2020-09-08 VITALS — BP 167/63 | HR 83 | Temp 97.0°F | Resp 18 | Ht 61.0 in | Wt 141.2 lb

## 2020-09-08 DIAGNOSIS — C182 Malignant neoplasm of ascending colon: Secondary | ICD-10-CM

## 2020-09-08 DIAGNOSIS — D509 Iron deficiency anemia, unspecified: Secondary | ICD-10-CM | POA: Diagnosis not present

## 2020-09-08 DIAGNOSIS — D500 Iron deficiency anemia secondary to blood loss (chronic): Secondary | ICD-10-CM | POA: Diagnosis not present

## 2020-09-08 DIAGNOSIS — Z85038 Personal history of other malignant neoplasm of large intestine: Secondary | ICD-10-CM | POA: Diagnosis not present

## 2020-09-08 DIAGNOSIS — I10 Essential (primary) hypertension: Secondary | ICD-10-CM | POA: Diagnosis not present

## 2020-09-08 LAB — CBC WITH DIFFERENTIAL (CANCER CENTER ONLY)
Abs Immature Granulocytes: 0.03 10*3/uL (ref 0.00–0.07)
Basophils Absolute: 0.1 10*3/uL (ref 0.0–0.1)
Basophils Relative: 1 %
Eosinophils Absolute: 0.1 10*3/uL (ref 0.0–0.5)
Eosinophils Relative: 1 %
HCT: 43.9 % (ref 36.0–46.0)
Hemoglobin: 13.9 g/dL (ref 12.0–15.0)
Immature Granulocytes: 0 %
Lymphocytes Relative: 16 %
Lymphs Abs: 1.3 10*3/uL (ref 0.7–4.0)
MCH: 24.6 pg — ABNORMAL LOW (ref 26.0–34.0)
MCHC: 31.7 g/dL (ref 30.0–36.0)
MCV: 77.8 fL — ABNORMAL LOW (ref 80.0–100.0)
Monocytes Absolute: 0.6 10*3/uL (ref 0.1–1.0)
Monocytes Relative: 8 %
Neutro Abs: 6.1 10*3/uL (ref 1.7–7.7)
Neutrophils Relative %: 74 %
Platelet Count: 282 10*3/uL (ref 150–400)
RBC: 5.64 MIL/uL — ABNORMAL HIGH (ref 3.87–5.11)
RDW: 22.2 % — ABNORMAL HIGH (ref 11.5–15.5)
WBC Count: 8.2 10*3/uL (ref 4.0–10.5)
nRBC: 0 % (ref 0.0–0.2)

## 2020-09-08 LAB — FERRITIN: Ferritin: 373 ng/mL — ABNORMAL HIGH (ref 11–307)

## 2020-09-08 NOTE — Progress Notes (Signed)
  [facility] OFFICE PROGRESS NOTE   Diagnosis: Colon cancer, iron deficiency anemia  INTERVAL HISTORY:   Amber Park returns as scheduled.  She completed treatment with Feraheme on 08/14/2020 and 08/21/2020 she tolerated the iron well.  No sign of an allergic reaction.  She reports mild malaise.  She is able to go about her activities in the home.  No new complaint.  Objective:  Vital signs in last 24 hours:  Blood pressure (!) 167/63, pulse 83, temperature (!) 97 F (36.1 C), temperature source Tympanic, resp. rate 18, height $RemoveBe'5\' 1"'[id number]$  (1.549 m), weight 141 lb 3.2 oz (64 kg), SpO2 98 %.  Lymphatics: No cervical, supraclavicular, axillary, or inguinal nodes Resp: Lungs clear bilaterally Cardio: Regular rate and rhythm GI: No mass, no hepatosplenomegaly, nontender Vascular: No leg edema    Lab Results:  Lab Results  Component Value Date   WBC 8.2 09/08/2020   HGB 13.9 09/08/2020   HCT 43.9 09/08/2020   MCV 77.8 (L) 09/08/2020   PLT 282 09/08/2020   NEUTROABS 6.1 09/08/2020    CMP  Lab Results  Component Value Date   NA 137 07/24/2020   K 3.1 (L) 07/24/2020   CL 104 07/24/2020   CO2 22 07/24/2020   GLUCOSE 134 (H) 07/24/2020   BUN 31 (H) 07/24/2020   CREATININE 1.33 (H) 07/24/2020   CALCIUM 8.7 (L) 07/24/2020   PROT 6.7 07/24/2020   ALBUMIN 4.1 07/24/2020   AST 26 07/24/2020   ALT 13 07/24/2020   ALKPHOS 82 07/24/2020   BILITOT 0.7 07/24/2020   GFRNONAA 37 (L) 07/24/2020   GFRAA 60 (L) 02/13/2020    Lab Results  Component Value Date   CEA1 <1.00 07/07/2020     Medications: I have reviewed the patient's current medications.   Assessment/Plan: 1. Colon cancer-stage IIIb ? Cecal mass on colonoscopy 01/03/2020, biopsy confirmed adenocarcinoma ? CTs 01/11/2020-cecal mass, prominent subcentimeter mesenteric nodes adjacent to the cecum, multiple tiny pulmonary nodules-metastases not favored ? Laparoscopic right colectomy 02/10/2020,pT3pN1a, grade  2-grade 3, 1/27 lymph nodes, 1 tumor deposit, MSI-high, loss of MLH1 and PMS 2 expression; MLH1 methylation detected 2. 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 and history of AVMs/Cameron's erosions  Ferrous gluconate 07/24/2020-allergic/infusion reaction with chest and back pain, diarrhea, nausea/vomiting, chills, presyncope, hypotension  Feraheme 08/14/2020 and 08/21/2020-tolerated well 3. Hiatal hernia 4. Family history of colon and bladder cancer 5. Transverse colon tubular adenoma on the colonoscopy 01/03/2020 6. Hypertension 7. Glaucoma    Disposition: Amber Park is in remission from colon cancer.  She completed treatment with IV iron last month.  The treatment was tolerated well.  The hemoglobin and hematologic markers of iron deficiency have improved.  We will follow up on the ferritin level from today.  She will return for an office and lab visit in 3 months.  She will call for symptoms of anemia.  Betsy Coder, MD  09/08/2020  12:08 PM

## 2020-09-08 NOTE — Telephone Encounter (Signed)
Scheduled appointments per 2/18 los. Spoke to patient who is aware of appointments date and times.

## 2020-09-11 ENCOUNTER — Telehealth: Payer: Self-pay

## 2020-09-11 NOTE — Telephone Encounter (Signed)
-----   Message from Ladell Pier, MD sent at 09/08/2020  1:10 PM EST ----- Please call patient, the ferritin level is normal, follow-up as scheduled

## 2020-09-11 NOTE — Telephone Encounter (Signed)
Spoke with pt made aware of lab results no questions asked

## 2020-09-13 DIAGNOSIS — I129 Hypertensive chronic kidney disease with stage 1 through stage 4 chronic kidney disease, or unspecified chronic kidney disease: Secondary | ICD-10-CM | POA: Diagnosis not present

## 2020-09-13 DIAGNOSIS — Z0000 Encounter for general adult medical examination without abnormal findings: Secondary | ICD-10-CM | POA: Diagnosis not present

## 2020-09-13 DIAGNOSIS — K219 Gastro-esophageal reflux disease without esophagitis: Secondary | ICD-10-CM | POA: Diagnosis not present

## 2020-09-13 DIAGNOSIS — J984 Other disorders of lung: Secondary | ICD-10-CM | POA: Diagnosis not present

## 2020-09-13 DIAGNOSIS — Z1389 Encounter for screening for other disorder: Secondary | ICD-10-CM | POA: Diagnosis not present

## 2020-09-27 ENCOUNTER — Other Ambulatory Visit: Payer: Self-pay | Admitting: Gastroenterology

## 2020-11-08 DIAGNOSIS — H40053 Ocular hypertension, bilateral: Secondary | ICD-10-CM | POA: Diagnosis not present

## 2020-11-08 DIAGNOSIS — Z961 Presence of intraocular lens: Secondary | ICD-10-CM | POA: Diagnosis not present

## 2020-11-08 DIAGNOSIS — H40013 Open angle with borderline findings, low risk, bilateral: Secondary | ICD-10-CM | POA: Diagnosis not present

## 2020-12-04 ENCOUNTER — Other Ambulatory Visit: Payer: Self-pay

## 2020-12-04 ENCOUNTER — Inpatient Hospital Stay: Payer: Medicare Other

## 2020-12-04 ENCOUNTER — Inpatient Hospital Stay: Payer: Medicare Other | Attending: Nurse Practitioner | Admitting: Nurse Practitioner

## 2020-12-04 ENCOUNTER — Encounter: Payer: Self-pay | Admitting: Nurse Practitioner

## 2020-12-04 VITALS — BP 148/69 | HR 88 | Temp 97.6°F | Resp 18 | Ht 61.0 in | Wt 146.4 lb

## 2020-12-04 DIAGNOSIS — R197 Diarrhea, unspecified: Secondary | ICD-10-CM | POA: Diagnosis not present

## 2020-12-04 DIAGNOSIS — K449 Diaphragmatic hernia without obstruction or gangrene: Secondary | ICD-10-CM | POA: Insufficient documentation

## 2020-12-04 DIAGNOSIS — K259 Gastric ulcer, unspecified as acute or chronic, without hemorrhage or perforation: Secondary | ICD-10-CM | POA: Insufficient documentation

## 2020-12-04 DIAGNOSIS — I10 Essential (primary) hypertension: Secondary | ICD-10-CM | POA: Diagnosis not present

## 2020-12-04 DIAGNOSIS — D509 Iron deficiency anemia, unspecified: Secondary | ICD-10-CM | POA: Insufficient documentation

## 2020-12-04 DIAGNOSIS — Z85038 Personal history of other malignant neoplasm of large intestine: Secondary | ICD-10-CM | POA: Diagnosis not present

## 2020-12-04 DIAGNOSIS — C182 Malignant neoplasm of ascending colon: Secondary | ICD-10-CM

## 2020-12-04 DIAGNOSIS — H409 Unspecified glaucoma: Secondary | ICD-10-CM | POA: Diagnosis not present

## 2020-12-04 DIAGNOSIS — L538 Other specified erythematous conditions: Secondary | ICD-10-CM | POA: Insufficient documentation

## 2020-12-04 DIAGNOSIS — D500 Iron deficiency anemia secondary to blood loss (chronic): Secondary | ICD-10-CM

## 2020-12-04 DIAGNOSIS — R1084 Generalized abdominal pain: Secondary | ICD-10-CM | POA: Insufficient documentation

## 2020-12-04 LAB — CBC WITH DIFFERENTIAL (CANCER CENTER ONLY)
Abs Immature Granulocytes: 0.04 10*3/uL (ref 0.00–0.07)
Basophils Absolute: 0 10*3/uL (ref 0.0–0.1)
Basophils Relative: 1 %
Eosinophils Absolute: 0.1 10*3/uL (ref 0.0–0.5)
Eosinophils Relative: 1 %
HCT: 44.8 % (ref 36.0–46.0)
Hemoglobin: 15.4 g/dL — ABNORMAL HIGH (ref 12.0–15.0)
Immature Granulocytes: 1 %
Lymphocytes Relative: 18 %
Lymphs Abs: 1.5 10*3/uL (ref 0.7–4.0)
MCH: 29 pg (ref 26.0–34.0)
MCHC: 34.4 g/dL (ref 30.0–36.0)
MCV: 84.4 fL (ref 80.0–100.0)
Monocytes Absolute: 0.8 10*3/uL (ref 0.1–1.0)
Monocytes Relative: 9 %
Neutro Abs: 6 10*3/uL (ref 1.7–7.7)
Neutrophils Relative %: 70 %
Platelet Count: 289 10*3/uL (ref 150–400)
RBC: 5.31 MIL/uL — ABNORMAL HIGH (ref 3.87–5.11)
RDW: 13.8 % (ref 11.5–15.5)
WBC Count: 8.5 10*3/uL (ref 4.0–10.5)
nRBC: 0 % (ref 0.0–0.2)

## 2020-12-04 LAB — FERRITIN: Ferritin: 108 ng/mL (ref 11–307)

## 2020-12-04 LAB — CEA (ACCESS): CEA (CHCC): 1.68 ng/mL (ref 0.00–5.00)

## 2020-12-04 NOTE — Progress Notes (Signed)
  [facility] OFFICE PROGRESS NOTE   Diagnosis: Colon cancer, iron deficiency anemia  INTERVAL HISTORY:   Amber Park returns as scheduled.  She reports a good appetite.  She is gaining weight.  She is not aware of any bleeding.  She continues to have intermittent generalized abdominal pain and loose stools 2-3 times a week.  She has been experiencing these symptoms since surgery July 2021.  Symptoms are largely unchanged.  She takes 1 Imodium typically 3 times a week with good relief of the loose stools.  She notes mild redness in both axilla.  Objective:  Vital signs in last 24 hours:  Blood pressure (!) 148/69, pulse 88, temperature 97.6 F (36.4 C), temperature source Oral, resp. rate 18, height $RemoveBe'5\' 1"'ZFkZngEzt$  (1.549 m), weight 146 lb 6.4 oz (66.4 kg), SpO2 94 %.    HEENT: Neck without mass. Lymphatics: No palpable cervical, supraclavicular, axillary or inguinal lymph nodes. Resp: Lungs clear bilaterally. Cardio: Regular rate and rhythm. GI: Abdomen soft and nontender.  No hepatomegaly.  No mass. Vascular: No leg edema.  Skin: Faint erythema bilateral axilla.   Lab Results:  Lab Results  Component Value Date   WBC 8.5 12/04/2020   HGB 15.4 (H) 12/04/2020   HCT 44.8 12/04/2020   MCV 84.4 12/04/2020   PLT 289 12/04/2020   NEUTROABS 6.0 12/04/2020    Imaging:  No results found.  Medications: I have reviewed the patient's current medications.  Assessment/Plan: 1. Colon cancer-stage IIIb ? Cecal mass on colonoscopy 01/03/2020, biopsy confirmed adenocarcinoma ? CTs 01/11/2020-cecal mass, prominent subcentimeter mesenteric nodes adjacent to the cecum, multiple tiny pulmonary nodules-metastases not favored ? Laparoscopic right colectomy 02/10/2020,pT3pN1a, grade 2-grade 3, 1/27 lymph nodes, 1 tumor deposit, MSI-high, loss of MLH1 and PMS 2 expression;MLH1 methylation detected 2. 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 and history of AVMs/Cameron's erosions  Ferrous  gluconate 07/24/2020-allergic/infusion reaction with chest and back pain, diarrhea, nausea/vomiting, chills, presyncope, hypotension  Feraheme 08/14/2020 and 08/21/2020-tolerated well 3. Hiatal hernia 4. Family history of colon and bladder cancer 5. Transverse colon tubular adenoma on the colonoscopy 01/03/2020 6. Hypertension 7. Glaucoma   Disposition: Ms. Currington remains in clinical remission from colon cancer.  We will follow-up on the CEA from today.  Plan for surveillance CT scans in approximately 2 months.  We reviewed the CBC from today.  Hemoglobin has corrected into normal range.  We will follow-up on the ferritin.  She recently noted mild redness at bilateral axilla.  I instructed her to stop using current deodorant, follow-up with dermatology if the erythema persists.  She will return for lab and CTs in approximately 2 months.  We will see her in follow-up a few days after that to review results.  She will contact the office in the interim with any problems.    Ned Card ANP/GNP-BC   12/04/2020  2:51 PM

## 2020-12-05 LAB — CEA (IN HOUSE-CHCC): CEA (CHCC-In House): 1.78 ng/mL (ref 0.00–5.00)

## 2020-12-07 ENCOUNTER — Telehealth: Payer: Self-pay

## 2020-12-07 NOTE — Telephone Encounter (Signed)
-----   Message from Owens Shark, NP sent at 12/06/2020 11:32 AM EDT ----- Please let her know CEA and ferritin are in normal range.  Follow-up as scheduled.

## 2020-12-07 NOTE — Telephone Encounter (Signed)
Pt called to review lab results x 2 pt did return call message left to again call pt with results and again this nurse was unable to reach this pt however message was left concerning lab results and pt encouraged to call for questions concern or changes

## 2020-12-16 ENCOUNTER — Other Ambulatory Visit: Payer: Self-pay | Admitting: Gastroenterology

## 2021-01-17 ENCOUNTER — Telehealth: Payer: Self-pay | Admitting: *Deleted

## 2021-01-17 NOTE — Telephone Encounter (Addendum)
Called patient w/CT scan appointment for 7/18 at 1045/1100. NPO 4 hours prior and drink contrast at 0900 and 1000. Will move her lab to 10:00 that day. Confirmed w/Dr. Benay Spice that does want CT scan WITHOUT IV contrast. Notified managed care.

## 2021-02-05 ENCOUNTER — Inpatient Hospital Stay: Payer: Medicare Other | Attending: Nurse Practitioner

## 2021-02-05 ENCOUNTER — Other Ambulatory Visit: Payer: Self-pay

## 2021-02-05 ENCOUNTER — Ambulatory Visit (HOSPITAL_BASED_OUTPATIENT_CLINIC_OR_DEPARTMENT_OTHER)
Admission: RE | Admit: 2021-02-05 | Discharge: 2021-02-05 | Disposition: A | Discharge status: Home / Self Care | Payer: Medicare Other | Source: Ambulatory Visit | Attending: Nurse Practitioner | Admitting: Nurse Practitioner

## 2021-02-05 DIAGNOSIS — C182 Malignant neoplasm of ascending colon: Secondary | ICD-10-CM

## 2021-02-05 DIAGNOSIS — R197 Diarrhea, unspecified: Secondary | ICD-10-CM | POA: Diagnosis not present

## 2021-02-05 DIAGNOSIS — Z85038 Personal history of other malignant neoplasm of large intestine: Secondary | ICD-10-CM | POA: Diagnosis not present

## 2021-02-05 DIAGNOSIS — D500 Iron deficiency anemia secondary to blood loss (chronic): Secondary | ICD-10-CM | POA: Diagnosis not present

## 2021-02-05 DIAGNOSIS — K449 Diaphragmatic hernia without obstruction or gangrene: Secondary | ICD-10-CM | POA: Diagnosis not present

## 2021-02-05 DIAGNOSIS — R918 Other nonspecific abnormal finding of lung field: Secondary | ICD-10-CM | POA: Diagnosis not present

## 2021-02-05 DIAGNOSIS — C189 Malignant neoplasm of colon, unspecified: Secondary | ICD-10-CM | POA: Diagnosis not present

## 2021-02-05 DIAGNOSIS — D509 Iron deficiency anemia, unspecified: Secondary | ICD-10-CM | POA: Diagnosis not present

## 2021-02-05 DIAGNOSIS — J9811 Atelectasis: Secondary | ICD-10-CM | POA: Diagnosis not present

## 2021-02-05 LAB — CBC WITH DIFFERENTIAL (CANCER CENTER ONLY)
Abs Immature Granulocytes: 0.02 10*3/uL (ref 0.00–0.07)
Basophils Absolute: 0.1 10*3/uL (ref 0.0–0.1)
Basophils Relative: 1 %
Eosinophils Absolute: 0.2 10*3/uL (ref 0.0–0.5)
Eosinophils Relative: 2 %
HCT: 47.5 % — ABNORMAL HIGH (ref 36.0–46.0)
Hemoglobin: 16.2 g/dL — ABNORMAL HIGH (ref 12.0–15.0)
Immature Granulocytes: 0 %
Lymphocytes Relative: 18 %
Lymphs Abs: 1.3 10*3/uL (ref 0.7–4.0)
MCH: 29 pg (ref 26.0–34.0)
MCHC: 34.1 g/dL (ref 30.0–36.0)
MCV: 85.1 fL (ref 80.0–100.0)
Monocytes Absolute: 0.7 10*3/uL (ref 0.1–1.0)
Monocytes Relative: 10 %
Neutro Abs: 5.1 10*3/uL (ref 1.7–7.7)
Neutrophils Relative %: 69 %
Platelet Count: 310 10*3/uL (ref 150–400)
RBC: 5.58 MIL/uL — ABNORMAL HIGH (ref 3.87–5.11)
RDW: 13.1 % (ref 11.5–15.5)
WBC Count: 7.4 10*3/uL (ref 4.0–10.5)
nRBC: 0 % (ref 0.0–0.2)

## 2021-02-05 LAB — CMP (CANCER CENTER ONLY)
ALT: 16 U/L (ref 0–44)
AST: 31 U/L (ref 15–41)
Albumin: 4.7 g/dL (ref 3.5–5.0)
Alkaline Phosphatase: 82 U/L (ref 38–126)
Anion gap: 12 (ref 5–15)
BUN: 17 mg/dL (ref 8–23)
CO2: 29 mmol/L (ref 22–32)
Calcium: 9.9 mg/dL (ref 8.9–10.3)
Chloride: 98 mmol/L (ref 98–111)
Creatinine: 1.12 mg/dL — ABNORMAL HIGH (ref 0.44–1.00)
GFR, Estimated: 46 mL/min — ABNORMAL LOW (ref >60)
Glucose, Bld: 92 mg/dL (ref 70–99)
Potassium: 4 mmol/L (ref 3.5–5.1)
Sodium: 139 mmol/L (ref 135–145)
Total Bilirubin: 0.9 mg/dL (ref 0.3–1.2)
Total Protein: 7.7 g/dL (ref 6.5–8.1)

## 2021-02-05 LAB — FERRITIN: Ferritin: 101 ng/mL (ref 11–307)

## 2021-02-08 ENCOUNTER — Other Ambulatory Visit: Payer: Self-pay

## 2021-02-08 ENCOUNTER — Inpatient Hospital Stay: Payer: Medicare Other | Admitting: Oncology

## 2021-02-08 VITALS — BP 169/68 | HR 98 | Temp 98.0°F | Resp 16 | Wt 149.4 lb

## 2021-02-08 DIAGNOSIS — C182 Malignant neoplasm of ascending colon: Secondary | ICD-10-CM

## 2021-02-08 DIAGNOSIS — Z85038 Personal history of other malignant neoplasm of large intestine: Secondary | ICD-10-CM | POA: Diagnosis not present

## 2021-02-08 DIAGNOSIS — R197 Diarrhea, unspecified: Secondary | ICD-10-CM | POA: Diagnosis not present

## 2021-02-08 DIAGNOSIS — D509 Iron deficiency anemia, unspecified: Secondary | ICD-10-CM | POA: Diagnosis not present

## 2021-02-08 NOTE — Progress Notes (Signed)
[facility] OFFICE PROGRESS NOTE   Diagnosis: Colon cancer, iron deficiency anemia  INTERVAL HISTORY:   Amber Park returns as scheduled.  She continues to have intermittent "diarrhea ".  She has several bowel movements per day.  The bowel movements are sometimes formed.  Imodium helps.  No bleeding.  No other complaint.  Objective:  Vital signs in last 24 hours:  Blood pressure (!) 169/68, pulse 98, temperature 98 F (36.7 C), temperature source Oral, resp. rate 16, weight 149 lb 6.4 oz (67.8 kg), SpO2 96 %.    Lymphatics: No cervical, supraclavicular, axillary, or inguinal nodes Resp: Lungs clear bilaterally Cardio: Regular rate and rhythm GI: No mass, nontender, no hepatosplenomegaly Vascular: No leg edema   Lab Results:  Lab Results  Component Value Date   WBC 7.4 02/05/2021   HGB 16.2 (H) 02/05/2021   HCT 47.5 (H) 02/05/2021   MCV 85.1 02/05/2021   PLT 310 02/05/2021   NEUTROABS 5.1 02/05/2021    CMP  Lab Results  Component Value Date   NA 139 02/05/2021   K 4.0 02/05/2021   CL 98 02/05/2021   CO2 29 02/05/2021   GLUCOSE 92 02/05/2021   BUN 17 02/05/2021   CREATININE 1.12 (H) 02/05/2021   CALCIUM 9.9 02/05/2021   PROT 7.7 02/05/2021   ALBUMIN 4.7 02/05/2021   AST 31 02/05/2021   ALT 16 02/05/2021   ALKPHOS 82 02/05/2021   BILITOT 0.9 02/05/2021   GFRNONAA 46 (L) 02/05/2021   GFRAA 60 (L) 02/13/2020    Lab Results  Component Value Date   CEA1 1.78 12/04/2020   CEA 1.68 12/04/2020    Lab Results  Component Value Date   INR 0.97 05/25/2014   LABPROT 13.0 05/25/2014    Imaging:  CT CHEST ABDOMEN PELVIS WO CONTRAST  Result Date: 02/06/2021 CLINICAL DATA:  Right colon cancer diagnosed June 2021. Restaging. History of pulmonary nodules. EXAM: CT CHEST, ABDOMEN AND PELVIS WITHOUT CONTRAST TECHNIQUE: Multidetector CT imaging of the chest, abdomen and pelvis was performed following the standard protocol without IV contrast.  COMPARISON:  01/11/2020 CT chest, abdomen and pelvis. FINDINGS: CT CHEST FINDINGS Cardiovascular: Normal heart size. No significant pericardial effusion/thickening. Three-vessel coronary atherosclerosis. Atherosclerotic nonaneurysmal thoracic aorta. Normal caliber pulmonary arteries. Mediastinum/Nodes: No discrete thyroid nodules. Mildly patulous thoracic esophagus filled with oral contrast. No pathologically enlarged axillary, mediastinal or hilar lymph nodes, noting limited sensitivity for the detection of hilar adenopathy on this noncontrast study. Lungs/Pleura: No pneumothorax. No pleural effusion. Compressive atelectasis in the medial left lower lobe from the large hiatal hernia. No acute consolidative airspace disease or lung masses. Stable mild biapical reticular scarring. Scattered tiny solid pulmonary nodules in the bilateral upper lobes, largest 2 mm in the apical left upper lobe (series 4/image 31), all stable and considered benign. No new significant pulmonary nodules. Musculoskeletal: No aggressive appearing focal osseous lesions. Moderate thoracic spondylosis. CT ABDOMEN PELVIS FINDINGS Hepatobiliary: Normal liver with no liver mass. Normal gallbladder with no radiopaque cholelithiasis. No biliary ductal dilatation. Pancreas: Normal, with no mass or duct dilation. Spleen: Normal size. No mass. Adrenals/Urinary Tract: Normal adrenals. No renal stones. No hydronephrosis. No contour deforming renal masses. Normal nondistended bladder. Stomach/Bowel: Large hiatal hernia. Stomach is not significantly distended and otherwise grossly normal. Interval right hemicolectomy with ileocolic anastomosis in the right abdomen. No dilated or thick-walled small bowel loops. Oral contrast transits to the left colon. Normal large bowel with no diverticulosis, large bowel wall thickening or pericolonic fat stranding. Vascular/Lymphatic:  Atherosclerotic nonaneurysmal abdominal aorta. No pathologically enlarged lymph nodes  in the abdomen or pelvis. Reproductive: Status post hysterectomy, with no abnormal findings at the vaginal cuff. No adnexal mass. Other: No pneumoperitoneum, ascites or focal fluid collection. Musculoskeletal: No aggressive appearing focal osseous lesions. Left total hip arthroplasty. Marked lumbar spondylosis. IMPRESSION: 1. No evidence of metastatic disease in the chest, abdomen or pelvis. Tiny upper lobe pulmonary nodules are all stable and considered benign. 2. Interval right hemicolectomy. No evidence of local tumor recurrence. 3. Large hiatal hernia. Mildly patulous thoracic esophagus filled with oral contrast, suggesting chronic esophageal dysmotility and/or gastroesophageal reflux. 4. Three-vessel coronary atherosclerosis. 5. Aortic Atherosclerosis (ICD10-I70.0). Electronically Signed   By: Ilona Sorrel M.D.   On: 02/06/2021 07:38    Medications: I have reviewed the patient's current medications.   Assessment/Plan: Colon cancer-stage IIIb Cecal mass on colonoscopy 01/03/2020, biopsy confirmed adenocarcinoma CTs 01/11/2020-cecal mass, prominent subcentimeter mesenteric nodes adjacent to the cecum, multiple tiny pulmonary nodules-metastases not favored Laparoscopic right colectomy 02/10/2020,pT3pN1a, grade 2-grade 3, 1/27 lymph nodes, 1 tumor deposit, MSI-high, loss of MLH1 and PMS 2 expression; MLH1 methylation detected CTs 02/05/2021-no evidence of recurrent disease, stable tiny upper lobe pulmonary nodules considered benign 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 and history of AVMs/Cameron's erosions Ferrous gluconate 07/24/2020-allergic/infusion reaction with chest and back pain, diarrhea, nausea/vomiting, chills, presyncope, hypotension Feraheme 08/14/2020 and 08/21/2020-tolerated well Hiatal hernia Family history of colon and bladder cancer Transverse colon tubular adenoma on the colonoscopy 01/03/2020 Hypertension Glaucoma     Disposition: Ms. Minchew is in clinical remission from colon  cancer.  She will return for an office visit and CEA in 6 months.  She is not anemic at present.  We will check a CBC in 3 months.  The frequent bowel movements and diarrhea are likely related to the partial colectomy.  I recommended she continue Imodium.  She will discuss options for managing the diarrhea with Dr. Felipa Eth.   Betsy Coder, MD  02/08/2021  3:57 PM

## 2021-03-27 DIAGNOSIS — J984 Other disorders of lung: Secondary | ICD-10-CM | POA: Diagnosis not present

## 2021-03-27 DIAGNOSIS — Z23 Encounter for immunization: Secondary | ICD-10-CM | POA: Diagnosis not present

## 2021-03-27 DIAGNOSIS — I129 Hypertensive chronic kidney disease with stage 1 through stage 4 chronic kidney disease, or unspecified chronic kidney disease: Secondary | ICD-10-CM | POA: Diagnosis not present

## 2021-03-27 DIAGNOSIS — N1832 Chronic kidney disease, stage 3b: Secondary | ICD-10-CM | POA: Diagnosis not present

## 2021-05-07 ENCOUNTER — Other Ambulatory Visit: Payer: Self-pay

## 2021-05-07 ENCOUNTER — Inpatient Hospital Stay: Payer: Medicare Other | Attending: Nurse Practitioner

## 2021-05-07 DIAGNOSIS — C182 Malignant neoplasm of ascending colon: Secondary | ICD-10-CM

## 2021-05-07 DIAGNOSIS — Z85038 Personal history of other malignant neoplasm of large intestine: Secondary | ICD-10-CM | POA: Diagnosis not present

## 2021-05-07 LAB — CBC WITH DIFFERENTIAL (CANCER CENTER ONLY)
Abs Immature Granulocytes: 0.03 K/uL (ref 0.00–0.07)
Basophils Absolute: 0.1 K/uL (ref 0.0–0.1)
Basophils Relative: 1 %
Eosinophils Absolute: 0.1 K/uL (ref 0.0–0.5)
Eosinophils Relative: 1 %
HCT: 45.2 % (ref 36.0–46.0)
Hemoglobin: 15.3 g/dL — ABNORMAL HIGH (ref 12.0–15.0)
Immature Granulocytes: 0 %
Lymphocytes Relative: 18 %
Lymphs Abs: 1.5 K/uL (ref 0.7–4.0)
MCH: 28.2 pg (ref 26.0–34.0)
MCHC: 33.8 g/dL (ref 30.0–36.0)
MCV: 83.4 fL (ref 80.0–100.0)
Monocytes Absolute: 0.8 K/uL (ref 0.1–1.0)
Monocytes Relative: 10 %
Neutro Abs: 5.9 K/uL (ref 1.7–7.7)
Neutrophils Relative %: 70 %
Platelet Count: 276 K/uL (ref 150–400)
RBC: 5.42 MIL/uL — ABNORMAL HIGH (ref 3.87–5.11)
RDW: 13.3 % (ref 11.5–15.5)
WBC Count: 8.3 K/uL (ref 4.0–10.5)
nRBC: 0 % (ref 0.0–0.2)

## 2021-05-08 ENCOUNTER — Telehealth: Payer: Self-pay

## 2021-05-08 NOTE — Telephone Encounter (Signed)
Spoke to pt made aware of most recent lab results CBC CMP no questions or concerns voiced

## 2021-05-08 NOTE — Telephone Encounter (Signed)
-----   Message from Ladell Pier, MD sent at 05/07/2021  8:18 PM EDT ----- Please call patient, hb is stable, f/u as schedule

## 2021-05-14 DIAGNOSIS — I129 Hypertensive chronic kidney disease with stage 1 through stage 4 chronic kidney disease, or unspecified chronic kidney disease: Secondary | ICD-10-CM | POA: Diagnosis not present

## 2021-05-14 DIAGNOSIS — E7800 Pure hypercholesterolemia, unspecified: Secondary | ICD-10-CM | POA: Diagnosis not present

## 2021-05-14 DIAGNOSIS — D500 Iron deficiency anemia secondary to blood loss (chronic): Secondary | ICD-10-CM | POA: Diagnosis not present

## 2021-06-20 ENCOUNTER — Other Ambulatory Visit: Payer: Self-pay | Admitting: Gastroenterology

## 2021-07-04 DIAGNOSIS — H40053 Ocular hypertension, bilateral: Secondary | ICD-10-CM | POA: Diagnosis not present

## 2021-08-06 ENCOUNTER — Inpatient Hospital Stay: Payer: Medicare Other | Attending: Nurse Practitioner | Admitting: Oncology

## 2021-08-06 ENCOUNTER — Other Ambulatory Visit: Payer: Self-pay

## 2021-08-06 ENCOUNTER — Inpatient Hospital Stay: Payer: Medicare Other

## 2021-08-06 VITALS — BP 160/80 | HR 97 | Temp 97.8°F | Resp 18 | Ht 61.0 in | Wt 149.0 lb

## 2021-08-06 DIAGNOSIS — D509 Iron deficiency anemia, unspecified: Secondary | ICD-10-CM | POA: Diagnosis not present

## 2021-08-06 DIAGNOSIS — C182 Malignant neoplasm of ascending colon: Secondary | ICD-10-CM

## 2021-08-06 DIAGNOSIS — Z85038 Personal history of other malignant neoplasm of large intestine: Secondary | ICD-10-CM | POA: Insufficient documentation

## 2021-08-06 DIAGNOSIS — H409 Unspecified glaucoma: Secondary | ICD-10-CM | POA: Diagnosis not present

## 2021-08-06 DIAGNOSIS — Z79899 Other long term (current) drug therapy: Secondary | ICD-10-CM | POA: Diagnosis not present

## 2021-08-06 DIAGNOSIS — Z8052 Family history of malignant neoplasm of bladder: Secondary | ICD-10-CM | POA: Insufficient documentation

## 2021-08-06 DIAGNOSIS — Z800 Family history of malignant neoplasm of digestive organs: Secondary | ICD-10-CM | POA: Diagnosis not present

## 2021-08-06 DIAGNOSIS — I10 Essential (primary) hypertension: Secondary | ICD-10-CM | POA: Diagnosis not present

## 2021-08-06 LAB — CBC WITH DIFFERENTIAL (CANCER CENTER ONLY)
Abs Immature Granulocytes: 0.03 10*3/uL (ref 0.00–0.07)
Basophils Absolute: 0.1 10*3/uL (ref 0.0–0.1)
Basophils Relative: 1 %
Eosinophils Absolute: 0.1 10*3/uL (ref 0.0–0.5)
Eosinophils Relative: 1 %
HCT: 46.7 % — ABNORMAL HIGH (ref 36.0–46.0)
Hemoglobin: 15.6 g/dL — ABNORMAL HIGH (ref 12.0–15.0)
Immature Granulocytes: 0 %
Lymphocytes Relative: 17 %
Lymphs Abs: 1.6 10*3/uL (ref 0.7–4.0)
MCH: 27.9 pg (ref 26.0–34.0)
MCHC: 33.4 g/dL (ref 30.0–36.0)
MCV: 83.4 fL (ref 80.0–100.0)
Monocytes Absolute: 0.8 10*3/uL (ref 0.1–1.0)
Monocytes Relative: 8 %
Neutro Abs: 6.7 10*3/uL (ref 1.7–7.7)
Neutrophils Relative %: 73 %
Platelet Count: 294 10*3/uL (ref 150–400)
RBC: 5.6 MIL/uL — ABNORMAL HIGH (ref 3.87–5.11)
RDW: 13.2 % (ref 11.5–15.5)
WBC Count: 9.3 10*3/uL (ref 4.0–10.5)
nRBC: 0 % (ref 0.0–0.2)

## 2021-08-06 LAB — CEA (ACCESS): CEA (CHCC): 1.43 ng/mL (ref 0.00–5.00)

## 2021-08-06 NOTE — Progress Notes (Signed)
[place] [facility] OFFICE PROGRESS NOTE   Diagnosis: Colon cancer, anemia  INTERVAL HISTORY:   Amber Park returns for a scheduled visit.  Good appetite.  No bleeding.  She has irregular bowel habits.  She reports malaise.  She is not taking iron.  Objective:  Vital signs in last 24 hours:  Blood pressure (!) 160/80, pulse 97, temperature 97.8 F (36.6 C), temperature source Oral, resp. rate 18, height $RemoveBe'5\' 1"'WzfRgtsJj$  (1.549 m), weight 149 lb (67.6 kg), SpO2 98 %.  Lymphatics: No cervical, supraclavicular, axillary, or inguinal nodes Resp: Lungs clear bilaterally Cardio: Regular rhythm with premature beats GI: No hepatosplenomegaly, no mass Vascular: No leg edema   Lab Results:  Lab Results  Component Value Date   WBC 9.3 08/06/2021   HGB 15.6 (H) 08/06/2021   HCT 46.7 (H) 08/06/2021   MCV 83.4 08/06/2021   PLT 294 08/06/2021   NEUTROABS 6.7 08/06/2021    CMP  Lab Results  Component Value Date   NA 139 02/05/2021   K 4.0 02/05/2021   CL 98 02/05/2021   CO2 29 02/05/2021   GLUCOSE 92 02/05/2021   BUN 17 02/05/2021   CREATININE 1.12 (H) 02/05/2021   CALCIUM 9.9 02/05/2021   PROT 7.7 02/05/2021   ALBUMIN 4.7 02/05/2021   AST 31 02/05/2021   ALT 16 02/05/2021   ALKPHOS 82 02/05/2021   BILITOT 0.9 02/05/2021   GFRNONAA 46 (L) 02/05/2021   GFRAA 60 (L) 02/13/2020    Lab Results  Component Value Date   CEA1 1.78 12/04/2020   CEA 1.68 12/04/2020     Medications: I have reviewed the patient's current medications.   Assessment/Plan: Colon cancer-stage IIIb Cecal mass on colonoscopy 01/03/2020, biopsy confirmed adenocarcinoma CTs 01/11/2020-cecal mass, prominent subcentimeter mesenteric nodes adjacent to the cecum, multiple tiny pulmonary nodules-metastases not favored Laparoscopic right colectomy 02/10/2020,pT3pN1a, grade 2-grade 3, 1/27 lymph nodes, 1 tumor deposit, MSI-high, loss of MLH1 and PMS 2 expression; MLH1 methylation detected CTs 02/05/2021-no  evidence of recurrent disease, stable tiny upper lobe pulmonary nodules considered benign 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 and history of AVMs/Cameron's erosions Ferrous gluconate 07/24/2020-allergic/infusion reaction with chest and back pain, diarrhea, nausea/vomiting, chills, presyncope, hypotension Feraheme 08/14/2020 and 08/21/2020-tolerated well Hiatal hernia Family history of colon and bladder cancer Transverse colon tubular adenoma on the colonoscopy 01/03/2020 Hypertension Glaucoma     Disposition: Amber Park remains in clinical remission from colon cancer.  I do not recommend surveillance imaging.  She will return for an office visit in 6 months.  The hemoglobin remains mildly elevated and has not changed significantly over the past 8 months.  Betsy Coder, MD  08/06/2021  11:47 AM

## 2021-09-18 DIAGNOSIS — Z0000 Encounter for general adult medical examination without abnormal findings: Secondary | ICD-10-CM | POA: Diagnosis not present

## 2021-09-18 DIAGNOSIS — Z1389 Encounter for screening for other disorder: Secondary | ICD-10-CM | POA: Diagnosis not present

## 2021-09-18 DIAGNOSIS — J984 Other disorders of lung: Secondary | ICD-10-CM | POA: Diagnosis not present

## 2021-09-18 DIAGNOSIS — K219 Gastro-esophageal reflux disease without esophagitis: Secondary | ICD-10-CM | POA: Diagnosis not present

## 2021-09-18 DIAGNOSIS — I129 Hypertensive chronic kidney disease with stage 1 through stage 4 chronic kidney disease, or unspecified chronic kidney disease: Secondary | ICD-10-CM | POA: Diagnosis not present

## 2021-10-28 IMAGING — DX DG CHEST 1V PORT
1 series · 1 of 1 positions shown · non-contrast
Comparison: Portable exam at 1491 hours compared to 09/14/2018

CLINICAL DATA: Mental status changes, dizziness

EXAM:
PORTABLE CHEST 1 VIEW

[chest ap]
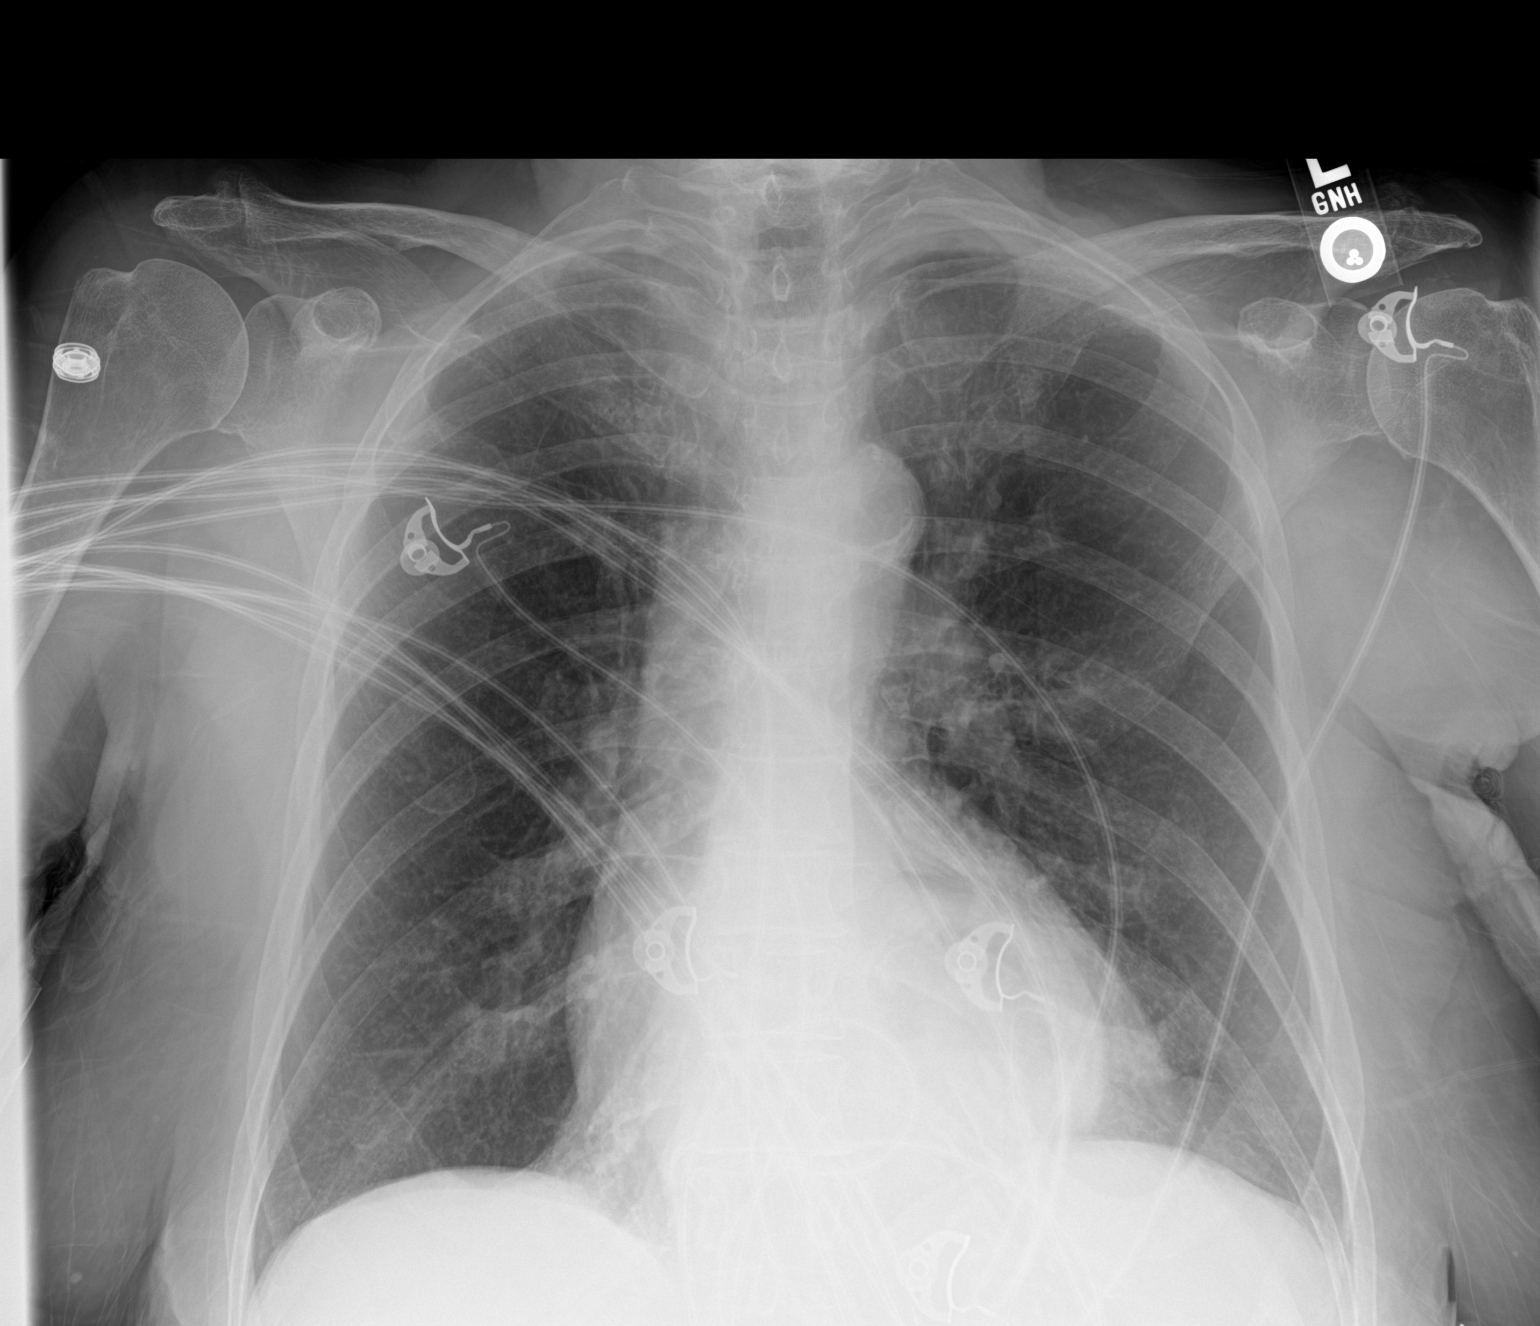

[1 of 1 positions shown; findings below may reference images not displayed]

FINDINGS: Normal heart size and pulmonary vascularity.

Atherosclerotic calcification aorta.

Large hiatal hernia.

Lungs clear.

No infiltrate, pleural effusion or pneumothorax.

Bones demineralized.
IMPRESSION: Large hiatal hernia.

No acute abnormalities.

## 2021-12-19 ENCOUNTER — Other Ambulatory Visit: Payer: Self-pay | Admitting: Gastroenterology

## 2021-12-31 DIAGNOSIS — H5213 Myopia, bilateral: Secondary | ICD-10-CM | POA: Diagnosis not present

## 2021-12-31 DIAGNOSIS — H52203 Unspecified astigmatism, bilateral: Secondary | ICD-10-CM | POA: Diagnosis not present

## 2021-12-31 DIAGNOSIS — Z961 Presence of intraocular lens: Secondary | ICD-10-CM | POA: Diagnosis not present

## 2021-12-31 DIAGNOSIS — H40053 Ocular hypertension, bilateral: Secondary | ICD-10-CM | POA: Diagnosis not present

## 2022-02-04 ENCOUNTER — Telehealth: Payer: Self-pay

## 2022-02-04 ENCOUNTER — Inpatient Hospital Stay: Payer: Medicare Other

## 2022-02-04 ENCOUNTER — Inpatient Hospital Stay: Payer: Medicare Other | Attending: Oncology | Admitting: Oncology

## 2022-02-04 VITALS — BP 159/88 | HR 88 | Temp 98.2°F | Resp 18 | Ht 61.0 in | Wt 148.0 lb

## 2022-02-04 DIAGNOSIS — I10 Essential (primary) hypertension: Secondary | ICD-10-CM | POA: Diagnosis not present

## 2022-02-04 DIAGNOSIS — D509 Iron deficiency anemia, unspecified: Secondary | ICD-10-CM | POA: Insufficient documentation

## 2022-02-04 DIAGNOSIS — Z85038 Personal history of other malignant neoplasm of large intestine: Secondary | ICD-10-CM | POA: Diagnosis not present

## 2022-02-04 DIAGNOSIS — H409 Unspecified glaucoma: Secondary | ICD-10-CM | POA: Diagnosis not present

## 2022-02-04 DIAGNOSIS — C182 Malignant neoplasm of ascending colon: Secondary | ICD-10-CM | POA: Diagnosis not present

## 2022-02-04 DIAGNOSIS — Z79899 Other long term (current) drug therapy: Secondary | ICD-10-CM | POA: Diagnosis not present

## 2022-02-04 LAB — CBC WITH DIFFERENTIAL (CANCER CENTER ONLY)
Abs Immature Granulocytes: 0.02 10*3/uL (ref 0.00–0.07)
Basophils Absolute: 0.1 10*3/uL (ref 0.0–0.1)
Basophils Relative: 1 %
Eosinophils Absolute: 0.1 10*3/uL (ref 0.0–0.5)
Eosinophils Relative: 1 %
HCT: 45.2 % (ref 36.0–46.0)
Hemoglobin: 15.5 g/dL — ABNORMAL HIGH (ref 12.0–15.0)
Immature Granulocytes: 0 %
Lymphocytes Relative: 18 %
Lymphs Abs: 1.6 10*3/uL (ref 0.7–4.0)
MCH: 28.7 pg (ref 26.0–34.0)
MCHC: 34.3 g/dL (ref 30.0–36.0)
MCV: 83.7 fL (ref 80.0–100.0)
Monocytes Absolute: 0.9 10*3/uL (ref 0.1–1.0)
Monocytes Relative: 10 %
Neutro Abs: 6.4 10*3/uL (ref 1.7–7.7)
Neutrophils Relative %: 70 %
Platelet Count: 280 10*3/uL (ref 150–400)
RBC: 5.4 MIL/uL — ABNORMAL HIGH (ref 3.87–5.11)
RDW: 13.4 % (ref 11.5–15.5)
WBC Count: 9 10*3/uL (ref 4.0–10.5)
nRBC: 0 % (ref 0.0–0.2)

## 2022-02-04 LAB — CEA (ACCESS): CEA (CHCC): 1.43 ng/mL (ref 0.00–5.00)

## 2022-02-04 LAB — FERRITIN: Ferritin: 68 ng/mL (ref 11–307)

## 2022-02-04 NOTE — Progress Notes (Signed)
  [facility] OFFICE PROGRESS NOTE   Diagnosis: Colon cancer  INTERVAL HISTORY:   Amber Park returns as scheduled.  Good appetite.  She has diarrhea approximately twice per week.  No bleeding.  She reports a decreased energy level.  Objective:  Vital signs in last 24 hours:  Blood pressure (!) 159/88, pulse 88, temperature 98.2 F (36.8 C), temperature source Oral, resp. rate 18, height $RemoveBe'5\' 1"'qBsUwGbpo$  (1.549 m), weight 148 lb (67.1 kg), SpO2 95 %.    Lymphatics: No cervical, supraclavicular, axillary, or inguinal nodes Resp: Scattered coarse end inspiratory rhonchi at the lower posterior chest bilaterally, no respiratory distress Cardio: Regular rhythm with premature beats GI: No mass, nontender, no hepatosplenomegaly Vascular: No leg edema   Lab Results:  Lab Results  Component Value Date   WBC 9.0 02/04/2022   HGB 15.5 (H) 02/04/2022   HCT 45.2 02/04/2022   MCV 83.7 02/04/2022   PLT 280 02/04/2022   NEUTROABS 6.4 02/04/2022    CMP  Lab Results  Component Value Date   NA 139 02/05/2021   K 4.0 02/05/2021   CL 98 02/05/2021   CO2 29 02/05/2021   GLUCOSE 92 02/05/2021   BUN 17 02/05/2021   CREATININE 1.12 (H) 02/05/2021   CALCIUM 9.9 02/05/2021   PROT 7.7 02/05/2021   ALBUMIN 4.7 02/05/2021   AST 31 02/05/2021   ALT 16 02/05/2021   ALKPHOS 82 02/05/2021   BILITOT 0.9 02/05/2021   GFRNONAA 46 (L) 02/05/2021   GFRAA 60 (L) 02/13/2020    Lab Results  Component Value Date   CEA1 1.78 12/04/2020   CEA 1.43 08/06/2021    Lab Results  Component Value Date   INR 0.97 05/25/2014   LABPROT 13.0 05/25/2014    Imaging:  No results found.  Medications: I have reviewed the patient's current medications.   Assessment/Plan: Colon cancer-stage IIIb Cecal mass on colonoscopy 01/03/2020, biopsy confirmed adenocarcinoma CTs 01/11/2020-cecal mass, prominent subcentimeter mesenteric nodes adjacent to the cecum, multiple tiny pulmonary nodules-metastases not  favored Laparoscopic right colectomy 02/10/2020,pT3pN1a, grade 2-grade 3, 1/27 lymph nodes, 1 tumor deposit, MSI-high, loss of MLH1 and PMS 2 expression; MLH1 methylation detected CTs 02/05/2021-no evidence of recurrent disease, stable tiny upper lobe pulmonary nodules considered benign 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 and history of AVMs/Cameron's erosions Ferrous gluconate 07/24/2020-allergic/infusion reaction with chest and back pain, diarrhea, nausea/vomiting, chills, presyncope, hypotension Feraheme 08/14/2020 and 08/21/2020-tolerated well Hiatal hernia Family history of colon and bladder cancer Transverse colon tubular adenoma on the colonoscopy 01/03/2020 Hypertension Glaucoma      Disposition: Amber Park remains in clinical remission from colon cancer.  We will follow-up on the CEA from today.  She is now 2 years out from diagnosis.  She will return for an office visit in 6 months.  There is no plan for surveillance imaging or colonoscopy . Betsy Coder, MD  02/04/2022  3:00 PM

## 2022-02-04 NOTE — Telephone Encounter (Signed)
-----   Message from Ladell Pier, MD sent at 02/04/2022  5:18 PM EDT ----- Please call patient, the CEA is normal, follow-up as scheduled

## 2022-02-04 NOTE — Telephone Encounter (Signed)
V/m message left. Informed of normal result. Informed to return call if any further questions or concerns.

## 2022-02-06 ENCOUNTER — Telehealth: Payer: Self-pay | Admitting: Gastroenterology

## 2022-02-06 NOTE — Telephone Encounter (Signed)
Inbound call from patient stating that she was scheduled for an appointment on 7/21 with Dr. Rush Landmark. Patient stated she was not aware that she had that appointment and she had a scheduling conflict with another appointment that day. Patient wanted me to cancel that appointment and send a message to the nurse so she could discuss if she needed to reschedule the appointment or if she is okay without seeing Dr. Rush Landmark. The appointment was for " Follow-up IDA, Hx of AVM's". Please advise.

## 2022-02-06 NOTE — Telephone Encounter (Signed)
The pt has been rescheduled to 03/28/22 at 2:50 pm with GM for follow-up.  She has no current complaints but her last visit was in 2021.  She is due for follow up.

## 2022-02-07 ENCOUNTER — Other Ambulatory Visit: Payer: Self-pay | Admitting: *Deleted

## 2022-02-08 ENCOUNTER — Ambulatory Visit: Payer: Medicare Other | Admitting: Gastroenterology

## 2022-02-12 ENCOUNTER — Observation Stay (HOSPITAL_COMMUNITY)
Admission: EM | Admit: 2022-02-12 | Discharge: 2022-02-17 | Disposition: A | Discharge status: Home / Self Care | Payer: Medicare Other | Attending: Internal Medicine | Admitting: Internal Medicine

## 2022-02-12 ENCOUNTER — Emergency Department (HOSPITAL_COMMUNITY): Payer: Medicare Other

## 2022-02-12 ENCOUNTER — Other Ambulatory Visit: Payer: Self-pay

## 2022-02-12 DIAGNOSIS — R778 Other specified abnormalities of plasma proteins: Secondary | ICD-10-CM | POA: Insufficient documentation

## 2022-02-12 DIAGNOSIS — K449 Diaphragmatic hernia without obstruction or gangrene: Secondary | ICD-10-CM | POA: Insufficient documentation

## 2022-02-12 DIAGNOSIS — Z79899 Other long term (current) drug therapy: Secondary | ICD-10-CM | POA: Diagnosis not present

## 2022-02-12 DIAGNOSIS — I774 Celiac artery compression syndrome: Secondary | ICD-10-CM | POA: Diagnosis not present

## 2022-02-12 DIAGNOSIS — K851 Biliary acute pancreatitis without necrosis or infection: Secondary | ICD-10-CM | POA: Insufficient documentation

## 2022-02-12 DIAGNOSIS — Z7982 Long term (current) use of aspirin: Secondary | ICD-10-CM | POA: Diagnosis not present

## 2022-02-12 DIAGNOSIS — C182 Malignant neoplasm of ascending colon: Secondary | ICD-10-CM | POA: Diagnosis not present

## 2022-02-12 DIAGNOSIS — M6281 Muscle weakness (generalized): Secondary | ICD-10-CM | POA: Insufficient documentation

## 2022-02-12 DIAGNOSIS — I10 Essential (primary) hypertension: Secondary | ICD-10-CM | POA: Insufficient documentation

## 2022-02-12 DIAGNOSIS — R748 Abnormal levels of other serum enzymes: Secondary | ICD-10-CM | POA: Diagnosis not present

## 2022-02-12 DIAGNOSIS — R2689 Other abnormalities of gait and mobility: Secondary | ICD-10-CM | POA: Insufficient documentation

## 2022-02-12 DIAGNOSIS — R0789 Other chest pain: Secondary | ICD-10-CM | POA: Diagnosis not present

## 2022-02-12 DIAGNOSIS — R1013 Epigastric pain: Secondary | ICD-10-CM | POA: Diagnosis not present

## 2022-02-12 DIAGNOSIS — R079 Chest pain, unspecified: Secondary | ICD-10-CM | POA: Diagnosis present

## 2022-02-12 DIAGNOSIS — K219 Gastro-esophageal reflux disease without esophagitis: Secondary | ICD-10-CM | POA: Diagnosis present

## 2022-02-12 DIAGNOSIS — E876 Hypokalemia: Secondary | ICD-10-CM | POA: Insufficient documentation

## 2022-02-12 DIAGNOSIS — Z85038 Personal history of other malignant neoplasm of large intestine: Secondary | ICD-10-CM | POA: Insufficient documentation

## 2022-02-12 DIAGNOSIS — Z85828 Personal history of other malignant neoplasm of skin: Secondary | ICD-10-CM | POA: Insufficient documentation

## 2022-02-12 DIAGNOSIS — I214 Non-ST elevation (NSTEMI) myocardial infarction: Secondary | ICD-10-CM | POA: Diagnosis not present

## 2022-02-12 DIAGNOSIS — R7989 Other specified abnormal findings of blood chemistry: Secondary | ICD-10-CM | POA: Insufficient documentation

## 2022-02-12 DIAGNOSIS — R932 Abnormal findings on diagnostic imaging of liver and biliary tract: Secondary | ICD-10-CM | POA: Insufficient documentation

## 2022-02-12 DIAGNOSIS — J9811 Atelectasis: Secondary | ICD-10-CM | POA: Diagnosis not present

## 2022-02-12 DIAGNOSIS — I447 Left bundle-branch block, unspecified: Secondary | ICD-10-CM | POA: Diagnosis not present

## 2022-02-12 DIAGNOSIS — R935 Abnormal findings on diagnostic imaging of other abdominal regions, including retroperitoneum: Secondary | ICD-10-CM | POA: Diagnosis not present

## 2022-02-12 DIAGNOSIS — I2510 Atherosclerotic heart disease of native coronary artery without angina pectoris: Secondary | ICD-10-CM | POA: Diagnosis not present

## 2022-02-12 DIAGNOSIS — K3189 Other diseases of stomach and duodenum: Secondary | ICD-10-CM | POA: Diagnosis not present

## 2022-02-12 DIAGNOSIS — K2950 Unspecified chronic gastritis without bleeding: Secondary | ICD-10-CM | POA: Diagnosis not present

## 2022-02-12 DIAGNOSIS — K8050 Calculus of bile duct without cholangitis or cholecystitis without obstruction: Secondary | ICD-10-CM | POA: Insufficient documentation

## 2022-02-12 DIAGNOSIS — I491 Atrial premature depolarization: Secondary | ICD-10-CM | POA: Diagnosis not present

## 2022-02-12 LAB — CBC
HCT: 41.3 % (ref 36.0–46.0)
Hemoglobin: 14.4 g/dL (ref 12.0–15.0)
MCH: 28.4 pg (ref 26.0–34.0)
MCHC: 34.9 g/dL (ref 30.0–36.0)
MCV: 81.5 fL (ref 80.0–100.0)
Platelets: 295 10*3/uL (ref 150–400)
RBC: 5.07 MIL/uL (ref 3.87–5.11)
RDW: 13 % (ref 11.5–15.5)
WBC: 9.8 10*3/uL (ref 4.0–10.5)
nRBC: 0 % (ref 0.0–0.2)

## 2022-02-12 LAB — HEPATIC FUNCTION PANEL
ALT: 17 U/L (ref 0–44)
AST: 33 U/L (ref 15–41)
Albumin: 3.8 g/dL (ref 3.5–5.0)
Alkaline Phosphatase: 97 U/L (ref 38–126)
Bilirubin, Direct: <0.1 mg/dL (ref 0.0–0.2)
Total Bilirubin: 0.4 mg/dL (ref 0.3–1.2)
Total Protein: 6.6 g/dL (ref 6.5–8.1)

## 2022-02-12 LAB — BASIC METABOLIC PANEL
Anion gap: 10 (ref 5–15)
BUN: 21 mg/dL (ref 8–23)
CO2: 25 mmol/L (ref 22–32)
Calcium: 9.3 mg/dL (ref 8.9–10.3)
Chloride: 101 mmol/L (ref 98–111)
Creatinine, Ser: 1.38 mg/dL — ABNORMAL HIGH (ref 0.44–1.00)
GFR, Estimated: 35 mL/min — ABNORMAL LOW (ref >60)
Glucose, Bld: 139 mg/dL — ABNORMAL HIGH (ref 70–99)
Potassium: 2.8 mmol/L — ABNORMAL LOW (ref 3.5–5.1)
Sodium: 136 mmol/L (ref 135–145)

## 2022-02-12 LAB — TROPONIN I (HIGH SENSITIVITY)
Troponin I (High Sensitivity): 17 ng/L (ref <18)
Troponin I (High Sensitivity): 90 ng/L — ABNORMAL HIGH (ref <18)

## 2022-02-12 LAB — LIPASE, BLOOD: Lipase: 94 U/L — ABNORMAL HIGH (ref 11–51)

## 2022-02-12 LAB — MAGNESIUM: Magnesium: 1.5 mg/dL — ABNORMAL LOW (ref 1.7–2.4)

## 2022-02-12 MED ORDER — ALUM & MAG HYDROXIDE-SIMETH 200-200-20 MG/5ML PO SUSP
30.0000 mL | Freq: Once | ORAL | Status: AC
  Administered 2022-02-12: 30 mL via ORAL
  Filled 2022-02-12: qty 30

## 2022-02-12 MED ORDER — ONDANSETRON HCL 4 MG/2ML IJ SOLN
4.0000 mg | Freq: Once | INTRAMUSCULAR | Status: AC
  Administered 2022-02-12: 4 mg via INTRAVENOUS
  Filled 2022-02-12: qty 2

## 2022-02-12 MED ORDER — MORPHINE SULFATE (PF) 2 MG/ML IV SOLN
2.0000 mg | Freq: Once | INTRAVENOUS | Status: AC
  Administered 2022-02-12: 2 mg via INTRAVENOUS
  Filled 2022-02-12: qty 1

## 2022-02-12 MED ORDER — MORPHINE SULFATE (PF) 4 MG/ML IV SOLN
4.0000 mg | Freq: Once | INTRAVENOUS | Status: AC
  Administered 2022-02-12: 4 mg via INTRAVENOUS
  Filled 2022-02-12: qty 1

## 2022-02-12 MED ORDER — HEPARIN (PORCINE) 25000 UT/250ML-% IV SOLN
800.0000 [IU]/h | INTRAVENOUS | Status: DC
  Administered 2022-02-13: 700 [IU]/h via INTRAVENOUS
  Filled 2022-02-12: qty 250

## 2022-02-12 MED ORDER — POTASSIUM CHLORIDE 10 MEQ/100ML IV SOLN
10.0000 meq | Freq: Once | INTRAVENOUS | Status: AC
  Administered 2022-02-12: 10 meq via INTRAVENOUS
  Filled 2022-02-12: qty 100

## 2022-02-12 MED ORDER — IOHEXOL 350 MG/ML SOLN
80.0000 mL | Freq: Once | INTRAVENOUS | Status: AC | PRN
  Administered 2022-02-12: 80 mL via INTRAVENOUS

## 2022-02-12 MED ORDER — HEPARIN BOLUS VIA INFUSION
2000.0000 [IU] | Freq: Once | INTRAVENOUS | Status: AC
  Administered 2022-02-13: 2000 [IU] via INTRAVENOUS
  Filled 2022-02-12: qty 2000

## 2022-02-12 MED ORDER — MAGNESIUM SULFATE 2 GM/50ML IV SOLN
2.0000 g | Freq: Once | INTRAVENOUS | Status: AC
  Administered 2022-02-12: 2 g via INTRAVENOUS
  Filled 2022-02-12: qty 50

## 2022-02-12 NOTE — Assessment & Plan Note (Signed)
H=   1  ,E= 2 ,A=  2   , R  1  , T   1    for the  Total of 7 therefore will admit for observation and further evaluation ( Risk of MACE: Scores 0-3  of 0.9-1.7%.,  4-6: 12-16.6% , Scores ?7: 50-65% ) - CT   negative for significant PE  - troponin elevated at 90 continue to follow cardiology aware -EKG showing left bundle branch block history of the same in the past  - Other explanation for chest pain could be GI related    - monitor on telemetry, cycle cardiac enzymes, obtain serial ECG and  ECHO in AM.   - Daily aspirin -  Further risk stratify with lipid panel, hgA1C, obtain TSH.  Make sure patient is on Aspirin.  We will notify cardiology regarding patient's admission. Further management depends on pending  workup Cardiology recommends starting heparin Keep n.p.o. postmidnight in case will need any further interventions

## 2022-02-12 NOTE — Assessment & Plan Note (Signed)
Chronic stable patient is followed by Dr. Benay Spice

## 2022-02-12 NOTE — Assessment & Plan Note (Signed)
May be contributing to chest pain presentation.

## 2022-02-12 NOTE — Consult Note (Incomplete)
Cardiology Consultation:   Patient ID: Amber Park MRN: [id number]; DOB: 1925/03/05  Admit date: 02/12/2022 Date of Consult: 02/12/2022  Primary Care Provider: Lajean Manes, MD Primary Cardiologist: None  Primary Electrophysiologist:  None    Patient Profile:   Amber Park is a 88 y.o. female with a hx of *** who is being seen today for the evaluation of *** at the request of ***.  History of Present Illness:   [name] ***  Past Medical History:  Diagnosis Date  . Anemia    iron deficiency  . Anxiety   . Arthritis   . AVM (arteriovenous malformation)    colon, stomach and bowel ( per progress note)  . Cancer (HCC)    hx of skin cancer removed from nose   . Family history of adverse reaction to anesthesia    Pt son has PONV  . GERD (gastroesophageal reflux disease)   . Glaucoma   . Hearing aid worn    B/L  . History of blood transfusion 1963  . History of hiatal hernia   . History of left bundle branch block    old ekg 06-23-11 dr Felipa Eth on chart  . HOH (hard of hearing)   . Hypertension   . Pneumonia    hx of pneumonia x 4   . PONV (postoperative nausea and vomiting)   . Shortness of breath dyspnea    with exertion   . Wears glasses     Past Surgical History:  Procedure Laterality Date  . ABDOMINAL HYSTERECTOMY    . BACK SURGERY     x 2  . BIOPSY  01/03/2020   Procedure: BIOPSY;  Surgeon: Rush Landmark Telford Nab., MD;  Location: [facility];  Service: Gastroenterology;;  . BREAST SURGERY     bilateral cyst removal   . COLONOSCOPY WITH PROPOFOL N/A 01/03/2020   Procedure: COLONOSCOPY WITH PROPOFOL;  Surgeon: Rush Landmark Telford Nab., MD;  Location: [facility];  Service: Gastroenterology;  Laterality: N/A;  . ENTEROSCOPY N/A 09/16/2018   Procedure: ENTEROSCOPY;  Surgeon: Rush Landmark Telford Nab., MD;  [facility] ENDOSCOPY;  Service: Gastroenterology;  Laterality: N/A;  . ENTEROSCOPY N/A 01/03/2020   Procedure: ENTEROSCOPY;  Surgeon: Rush Landmark  Telford Nab., MD;  Location: [facility] ENDOSCOPY;  Service: Gastroenterology;  Laterality: N/A;  . HOT HEMOSTASIS N/A 09/16/2018   Procedure: HOT HEMOSTASIS (ARGON PLASMA COAGULATION/BICAP);  Surgeon: Irving Copas., MD;  Location: [facility];  Service: Gastroenterology;  Laterality: N/A;  . LAPAROSCOPIC PARTIAL COLECTOMY N/A 02/10/2020   Procedure: LAPAROSCOPIC PARTIAL COLECTOMY;  Surgeon: Leighton Ruff, MD;  Location: [facility];  Service: General;  Laterality: N/A;  . POLYPECTOMY  01/03/2020   Procedure: POLYPECTOMY;  Surgeon: Irving Copas., MD;  Location: [facility];  Service: Gastroenterology;;  . Lia Foyer TATTOO INJECTION  01/03/2020   Procedure: SUBMUCOSAL TATTOO INJECTION;  Surgeon: Irving Copas., MD;  Location: [facility];  Service: Gastroenterology;;  . TONSILLECTOMY    . TOTAL HIP ARTHROPLASTY Left 05/31/2014   Procedure: LEFT TOTAL HIP ARTHROPLASTY ANTERIOR APPROACH;  Surgeon: Mauri Pole, MD;  Location: [facility];  Service: Orthopedics;  Laterality: Left;     {Home Medications (Optional):21181}  Inpatient Medications: Scheduled Meds:  Continuous Infusions: . magnesium sulfate bolus IVPB 2 g (02/12/22 2335)   PRN Meds:   Allergies:    Allergies  Allergen Reactions  . Ferrous Gluconate     Back and chest pain, nausea, diarrhea, chills, hypotension, presyncope  . Anesthesia S-I-40 [Propofol] Nausea And Vomiting  Anesthetics -- N/V  . Codeine Nausea And Vomiting  . Meperidine Hcl Nausea And Vomiting  . Pneumococcal Vac Polyvalent     Other reaction(s): red and swollen  . Simvastatin     Other reaction(s): muscle pain    Social History:   Social History   Socioeconomic History  . Marital status: Widowed    Spouse name: Not on file  . Number of children: 3  . Years of education: Not on file  . Highest education level: Not on file  Occupational History  . Occupation: Retired Designer, multimedia at [employer]  . Smoking  status: Former    Packs/day: 0.30    Types: Cigarettes    Start date: 44    Quit date: 2005    Years since quitting: 18.5  . Smokeless tobacco: Never  Vaping Use  . Vaping Use: Never used  Substance and Sexual Activity  . Alcohol use: Not Currently    Comment: rare   . Drug use: No  . Sexual activity: Not on file  Other Topics Concern  . Not on file  Social History Narrative  . Not on file   Social Determinants of Health   Financial Resource Strain: Not on file  Food Insecurity: Not on file  Transportation Needs: Not on file  Physical Activity: Not on file  Stress: Not on file  Social Connections: Not on file  Intimate Partner Violence: Not on file    Family History:   *** Family History  Problem Relation Age of Onset  . Colon cancer Mother 65       Died age 50  . Heart attack Father 51       Died in his sleep of an MI  . Lung cancer Sister        LLL resection, otherwise no treatment; now recurred  . Bladder Cancer Sister        Likely primary  . Renal cancer Sister   . Breast cancer Sister   . Heart disease Brother   . Heart disease Brother   . Esophageal cancer Neg Hx   . Inflammatory bowel disease Neg Hx   . Liver disease Neg Hx   . Pancreatic cancer Neg Hx   . Stomach cancer Neg Hx      Review of Systems: [y] = yes, '[ ]'$  = no    General: Weight gain '[ ]'$ ; Weight loss '[ ]'$ ; Anorexia '[ ]'$ ; Fatigue '[ ]'$ ; Fever '[ ]'$ ; Chills '[ ]'$ ; Weakness '[ ]'$   Cardiac: Chest pain/pressure '[ ]'$ ; Resting SOB '[ ]'$ ; Exertional SOB '[ ]'$ ; Orthopnea '[ ]'$ ; Pedal Edema '[ ]'$ ; Palpitations '[ ]'$ ; Syncope '[ ]'$ ; Presyncope '[ ]'$ ; Paroxysmal nocturnal dyspnea'[ ]'$   Pulmonary: Cough '[ ]'$ ; Wheezing'[ ]'$ ; Hemoptysis'[ ]'$ ; Sputum '[ ]'$ ; Snoring '[ ]'$   GI: Vomiting'[ ]'$ ; Dysphagia'[ ]'$ ; Melena'[ ]'$ ; Hematochezia '[ ]'$ ; Heartburn'[ ]'$ ; Abdominal pain '[ ]'$ ; Constipation '[ ]'$ ; Diarrhea '[ ]'$ ; BRBPR '[ ]'$   GU: Hematuria'[ ]'$ ; Dysuria '[ ]'$ ; Nocturia'[ ]'$   Vascular: Pain in legs with walking '[ ]'$ ; Pain in feet with lying flat '[ ]'$ ; Non-healing  sores '[ ]'$ ; Stroke '[ ]'$ ; TIA '[ ]'$ ; Slurred speech '[ ]'$ ;  Neuro: Headaches'[ ]'$ ; Vertigo'[ ]'$ ; Seizures'[ ]'$ ; Paresthesias'[ ]'$ ;Blurred vision '[ ]'$ ; Diplopia '[ ]'$ ; Vision changes '[ ]'$   Ortho/Skin: Arthritis '[ ]'$ ; Joint pain '[ ]'$ ; Muscle pain '[ ]'$ ; Joint swelling '[ ]'$ ; Back Pain '[ ]'$ ; Rash '[ ]'$   Psych: Depression'[ ]'$ ; Anxiety'[ ]'$   Heme: Bleeding problems '[ ]'$ ; Clotting disorders '[ ]'$ ; Anemia '[ ]'$   Endocrine: Diabetes '[ ]'$ ; Thyroid dysfunction'[ ]'$   Physical Exam/Data:   Vitals:   02/12/22 2130 02/12/22 2205 02/12/22 2245 02/12/22 2300  BP: (!) 157/61 (!) 162/64 (!) 161/64 (!) 162/67  Pulse: 86 97 92 93  Resp: (!) 24  (!) 22 19  Temp:      TempSrc:      SpO2: (!) 88% 93% 92% 92%    Intake/Output Summary (Last 24 hours) at 02/12/2022 2336 Last data filed at 02/12/2022 2335 Gross per 24 hour  Intake 100 ml  Output --  Net 100 ml   There were no vitals filed for this visit. There is no height or weight on file to calculate BMI.  General:  Well nourished, well developed, in no acute distress*** HEENT: normal Lymph: no adenopathy Neck: no JVD Endocrine:  No thryomegaly Vascular: No carotid bruits; FA pulses 2+ bilaterally without bruits  Cardiac:  normal S1, S2; RRR; no murmur *** Lungs:  clear to auscultation bilaterally, no wheezing, rhonchi or rales  Abd: soft, nontender, no hepatomegaly  Ext: no edema Musculoskeletal:  No deformities, BUE and BLE strength normal and equal Skin: warm and dry  Neuro:  CNs 2-12 intact, no focal abnormalities noted Psych:  Normal affect   EKG:  The EKG was personally reviewed and demonstrates:  *** Telemetry:  Telemetry was personally reviewed and demonstrates:  ***  Relevant CV Studies: ***  Laboratory Data:  Chemistry Recent Labs  Lab 02/12/22 2009  NA 136  K 2.8*  CL 101  CO2 25  GLUCOSE 139*  BUN 21  CREATININE 1.38*  CALCIUM 9.3  GFRNONAA 35*  ANIONGAP 10    Recent Labs  Lab 02/12/22 2009  PROT 6.6  ALBUMIN 3.8  AST 33  ALT 17  ALKPHOS 97   BILITOT 0.4   Hematology Recent Labs  Lab 02/12/22 2009  WBC 9.8  RBC 5.07  HGB 14.4  HCT 41.3  MCV 81.5  MCH 28.4  MCHC 34.9  RDW 13.0  PLT 295   Cardiac EnzymesNo results for input(s): "TROPONINI" in the last 168 hours. No results for input(s): "TROPIPOC" in the last 168 hours.  BNPNo results for input(s): "BNP", "PROBNP" in the last 168 hours.  DDimer No results for input(s): "DDIMER" in the last 168 hours.  Radiology/Studies:  CT Angio Chest/Abd/Pel for Dissection W and/or Wo Contrast  Result Date: 02/12/2022 CLINICAL DATA:  Chest or back pain with aortic dissection suspected. Substernal chest pain this afternoon radiating to the abdomen. EXAM: CT ANGIOGRAPHY CHEST, ABDOMEN AND PELVIS TECHNIQUE: Non-contrast CT of the chest was initially obtained. Multidetector CT imaging through the chest, abdomen and pelvis was performed using the standard protocol during bolus administration of intravenous contrast. Multiplanar reconstructed images and MIPs were obtained and reviewed to evaluate the vascular anatomy. RADIATION DOSE REDUCTION: This exam was performed according to the departmental dose-optimization program which includes automated exposure control, adjustment of the mA and/or kV according to patient size and/or use of iterative reconstruction technique. CONTRAST:  91m OMNIPAQUE IOHEXOL 350 MG/ML SOLN COMPARISON:  02/05/2021 FINDINGS: CTA CHEST FINDINGS Cardiovascular: Unenhanced images of the chest demonstrate calcification of the aorta and coronary arteries. No evidence of intramural hematoma. Images obtained during arterial phase after intravenous contrast material administration demonstrate normal caliber thoracic aorta. No aortic dissection. Great vessel origins are patent. Normal heart size. No pericardial effusions. Central pulmonary arteries are patent without evidence of significant pulmonary embolus.  Mediastinum/Nodes: Thyroid gland is unremarkable. Large esophageal hiatal  hernia. Esophagus is decompressed. No significant lymphadenopathy. Lungs/Pleura: Mild atelectasis in the lungs. Focal subpleural opacity in the right upper lung is unchanged since prior study. Tiny pulmonary nodules are unchanged. Musculoskeletal: Degenerative changes.  No acute bony abnormalities. Review of the MIP images confirms the above findings. CTA ABDOMEN AND PELVIS FINDINGS VASCULAR Aorta: Diffuse aortic calcification. Normal caliber abdominal aorta. No dissection. Celiac: Focal calcific stenosis of the origin of the celiac axis, representing about 50% diameter reduction. Prompt reconstitution. SMA: The superior mesenteric artery is small in caliber but appears patent. Focal proximal stenosis with about 40% diameter reduction. Renals: Renal arteries are patent bilaterally. No aneurysm or occlusion. Nephrograms are symmetrical. IMA: Inferior mesenteric artery is patent. Inflow: Patent without evidence of aneurysm, dissection, vasculitis or significant stenosis. Veins: No obvious venous abnormality within the limitations of this arterial phase study. Review of the MIP images confirms the above findings. NON-VASCULAR Hepatobiliary: No focal liver abnormality is seen. No gallstones, gallbladder wall thickening, or biliary dilatation. Pancreas: Unremarkable. No pancreatic ductal dilatation or surrounding inflammatory changes. Spleen: Normal in size without focal abnormality. Adrenals/Urinary Tract: Adrenal glands are unremarkable. Kidneys are normal, without renal calculi, focal lesion, or hydronephrosis. Bladder is unremarkable. Stomach/Bowel: Stomach, small bowel, and colon are not abnormally distended. Partial right hemicolectomy with ileocolonic anastomosis. No wall thickening or inflammatory changes. Lymphatic: Nonspecific lymphadenopathy in the mesentery with mild mesenteric stranding. Largest lymph nodes measure up to about 8 mm short axis dimension. Appearance is nonspecific. Changes are more prominent  than on the prior study. Possibly inflammatory or reactive change but early metastatic disease could potentially have this appearance. Reproductive: Status post hysterectomy. No adnexal masses. Other: No abdominal wall hernia or abnormality. No abdominopelvic ascites. Musculoskeletal: Degenerative changes. No acute bony abnormalities. Postoperative change in the left hip. Review of the MIP images confirms the above findings. IMPRESSION: 1. Aortic atherosclerosis. No evidence of aortic aneurysm or dissection. 2. No evidence of metastatic disease in the chest. 3. Postoperative right hemicolectomy with patent ileocolonic anastomosis. 4. Borderline prominence of mesenteric lymph nodes with mesenteric stranding, increased since prior study. Appearances are nonspecific. 5. Large esophageal hiatal hernia. 6. 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 Electronically Signed   By: Lucienne Capers M.D.   On: 02/12/2022 22:45   DG Chest Port 1 View  Result Date: 02/12/2022 CLINICAL DATA:  Chest pain EXAM: PORTABLE CHEST 1 VIEW COMPARISON:  07/24/2020 FINDINGS: Lungs are clear.  No pleural effusion or pneumothorax. The heart is normal in size.  Thoracic aortic atherosclerosis. Large hiatal hernia. IMPRESSION: No evidence of acute cardiopulmonary disease. Electronically Signed   By: Julian Hy M.D.   On: 02/12/2022 21:00    Assessment and Plan:   NSTEMI  {Are we signing off today?:[id number]}  For questions or updates, please contact [place] Please consult [email or web address] for contact info under     Signed, Doyne Keel, MD  02/12/2022 11:36 PM

## 2022-02-12 NOTE — Progress Notes (Signed)
ANTICOAGULATION CONSULT NOTE - Initial Consult  Pharmacy Consult for heparin Indication:  NSTEMI  Allergies  Allergen Reactions   Ferrous Gluconate     Back and chest pain, nausea, diarrhea, chills, hypotension, presyncope   Anesthesia S-I-40 [Propofol] Nausea And Vomiting    Anesthetics -- N/V   Codeine Nausea And Vomiting   Meperidine Hcl Nausea And Vomiting   Pneumococcal Vac Polyvalent     Other reaction(s): red and swollen   Simvastatin     Other reaction(s): muscle pain    Patient Measurements: Height: '5\' 1"'$  (154.9 cm) Weight: 67.1 kg (148 lb) IBW/kg (Calculated) : 47.8 Heparin Dosing Weight: 60kg  Vital Signs: Temp: 97.9 F (36.6 C) (07/25 2000) Temp Source: Oral (07/25 2000) BP: 162/67 (07/25 2300) Pulse Rate: 93 (07/25 2300)  Labs: Recent Labs    02/12/22 2009 02/12/22 2211  HGB 14.4  --   HCT 41.3  --   PLT 295  --   CREATININE 1.38*  --   TROPONINIHS 17 90*    Estimated Creatinine Clearance: 21.8 mL/min (A) (by C-G formula based on SCr of 1.38 mg/dL (H)).   Medical History: Past Medical History:  Diagnosis Date   Anemia    iron deficiency   Anxiety    Arthritis    AVM (arteriovenous malformation)    colon, stomach and bowel ( per progress note)   Cancer (HCC)    hx of skin cancer removed from nose    Family history of adverse reaction to anesthesia    Pt son has PONV   GERD (gastroesophageal reflux disease)    Glaucoma    Hearing aid worn    B/L   History of blood transfusion 1963   History of hiatal hernia    History of left bundle branch block    old ekg 06-23-11 dr Felipa Eth on chart   HOH (hard of hearing)    Hypertension    Pneumonia    hx of pneumonia x 4    PONV (postoperative nausea and vomiting)    Shortness of breath dyspnea    with exertion    Wears glasses     Assessment: 88yo female c/o substernal CP radiating to abdomen and back with partial relief after SL NTG, initial troponin negative but now rising >> to begin  heparin.  Goal of Therapy:  Heparin level 0.3-0.7 units/ml Monitor platelets by anticoagulation protocol: Yes   Plan:  Heparin 2000 units IV bolus x1 followed by infusion at 700 units/hr. Monitor heparin levels and CBC.  Wynona Neat, PharmD, BCPS  02/12/2022,11:45 PM

## 2022-02-12 NOTE — ED Notes (Signed)
Patient transported to CT 

## 2022-02-12 NOTE — Assessment & Plan Note (Signed)
We will replace and recheck in a.m.

## 2022-02-12 NOTE — ED Triage Notes (Signed)
Pt arrives to ED from [place]. Per EMS pt began experiencing substernal Chest Pain this afternoon that radiates to Abdomen and Back. EMS administered '324mg'$  Aspirin and 2 Sublingual Nitro. Pain went from 9 to 5. Hx of HTN.   BP 141/86 HR 80 O2 96% RA CBG 151

## 2022-02-12 NOTE — ED Provider Notes (Signed)
[facility] DEPART[facility] Provider Note   CSN: [id number] Arrival date & time: 02/12/22  1958     History  Chief Complaint  Patient presents with   Chest Pain    Amber Park is a 88 y.o. female.  Pt is a 88 yo female with a pmhx significant for gerd, skin cancer, anxiety, colon cancer (s/p colectomy and in remission), htn, and glaucoma.  She suddenly developed cp at 1630.  Pain then went to her abdomen and back.  She feels very uncomfortable.  She was given asa and nitro by EMS.  Pain has improved, but it is still very uncomfortable.  Pt denies any sob.  No n/v.  No fever or cough.       Home Medications Prior to Admission medications   Medication Sig Start Date End Date Taking? Authorizing Provider  acetaminophen (TYLENOL) 500 MG tablet Take 500 mg by mouth at bedtime.    [provider]  amLODipine (NORVASC) 10 MG tablet Take 10 mg by mouth daily. 08/28/18   [provider]  Cholecalciferol (VITAMIN D3) 50 MCG (2000 UT) capsule Take 2,000 Units by mouth daily.    [provider]  hydrochlorothiazide (HYDRODIURIL) 25 MG tablet Take 25 mg by mouth daily. 06/15/19   [provider]  hydrocortisone cream 1 % Apply 1 application topically daily as needed for itching. Patient not taking: Reported on 07/28/2020    [provider]  latanoprost (XALATAN) 0.005 % ophthalmic solution Place 1 drop into both eyes at bedtime.  09/11/18   [provider]  loperamide (IMODIUM A-D) 2 MG tablet Take 2 mg by mouth 4 (four) times daily as needed for diarrhea or loose stools.    [provider]  ondansetron (ZOFRAN) 4 MG tablet Take 1 tablet (4 mg total) by mouth every 6 (six) hours as needed for nausea or vomiting. Patient not taking: Reported on 07/28/2020 07/24/20   Gareth Morgan, MD  pantoprazole (PROTONIX) 40 MG tablet TAKE 1 TABLET(40 MG) BY MOUTH DAILY 12/19/21   Mansouraty, Telford Nab., MD  potassium  chloride (KLOR-CON) 10 MEQ tablet Take 10 mEq by mouth daily.    [provider]  tiZANidine (ZANAFLEX) 4 MG tablet Take 1 tablet (4 mg total) by mouth every 8 (eight) hours as needed for muscle spasms. Patient taking differently: Take 4 mg by mouth at bedtime. 06/02/14   Danae Orleans, PA-C      Allergies    Ferrous gluconate, Anesthesia s-i-40 [propofol], Codeine, Meperidine hcl, Pneumococcal vac polyvalent, and Simvastatin    Review of Systems   Review of Systems  Cardiovascular:  Positive for chest pain.  Gastrointestinal:  Positive for abdominal pain.  Musculoskeletal:  Positive for back pain.  All other systems reviewed and are negative.   Physical Exam Updated Vital Signs BP (!) 162/67   Pulse 93   Temp 97.9 F (36.6 C) (Oral)   Resp 19   SpO2 92%  Physical Exam Vitals and nursing note reviewed.  Constitutional:      Appearance: She is well-developed.  HENT:     Head: Normocephalic and atraumatic.  Eyes:     Extraocular Movements: Extraocular movements intact.     Pupils: Pupils are equal, round, and reactive to light.  Cardiovascular:     Rate and Rhythm: Normal rate and regular rhythm.     Heart sounds: Normal heart sounds.  Pulmonary:     Effort: Pulmonary effort is normal.     Breath  sounds: Normal breath sounds.  Abdominal:     General: Bowel sounds are normal.     Palpations: Abdomen is soft.  Musculoskeletal:        General: Normal range of motion.     Cervical back: Normal range of motion and neck supple.  Skin:    General: Skin is warm.     Capillary Refill: Capillary refill takes less than 2 seconds.  Neurological:     General: No focal deficit present.     Mental Status: She is alert and oriented to person, place, and time.  Psychiatric:        Mood and Affect: Mood normal.        Behavior: Behavior normal.     ED Results / Procedures / Treatments   Labs (all labs ordered are listed, but only abnormal results are displayed) Labs  Reviewed  BASIC METABOLIC PANEL - Abnormal; Notable for the following components:      Result Value   Potassium 2.8 (*)    Glucose, Bld 139 (*)    Creatinine, Ser 1.38 (*)    GFR, Estimated 35 (*)    All other components within normal limits  LIPASE, BLOOD - Abnormal; Notable for the following components:   Lipase 94 (*)    All other components within normal limits  MAGNESIUM - Abnormal; Notable for the following components:   Magnesium 1.5 (*)    All other components within normal limits  TROPONIN I (HIGH SENSITIVITY) - Abnormal; Notable for the following components:   Troponin I (High Sensitivity) 90 (*)    All other components within normal limits  CBC  HEPATIC FUNCTION PANEL  CK  OSMOLALITY  MAGNESIUM  PHOSPHORUS  PREALBUMIN  TSH  URINALYSIS, COMPLETE (UACMP) WITH MICROSCOPIC  LIPASE, BLOOD  TROPONIN I (HIGH SENSITIVITY)  TROPONIN I (HIGH SENSITIVITY)    EKG EKG Interpretation  Date/Time:  Tuesday February 12 2022 20:29:57 EDT Ventricular Rate:  79 PR Interval:  156 QRS Duration: 144 QT Interval:  427 QTC Calculation: 490 R Axis:   -26 Text Interpretation: Sinus rhythm Atrial premature complexes Right atrial enlargement Left bundle branch block No significant change since last tracing Confirmed by Isla Pence [phone]) on 02/12/2022 8:35:35 PM  Radiology CT Angio Chest/Abd/Pel for Dissection W and/or Wo Contrast  Result Date: 02/12/2022 CLINICAL DATA:  Chest or back pain with aortic dissection suspected. Substernal chest pain this afternoon radiating to the abdomen. EXAM: CT ANGIOGRAPHY CHEST, ABDOMEN AND PELVIS TECHNIQUE: Non-contrast CT of the chest was initially obtained. Multidetector CT imaging through the chest, abdomen and pelvis was performed using the standard protocol during bolus administration of intravenous contrast. Multiplanar reconstructed images and MIPs were obtained and reviewed to evaluate the vascular anatomy. RADIATION DOSE REDUCTION: This exam was  performed according to the departmental dose-optimization program which includes automated exposure control, adjustment of the mA and/or kV according to patient size and/or use of iterative reconstruction technique. CONTRAST:  27m OMNIPAQUE IOHEXOL 350 MG/ML SOLN COMPARISON:  02/05/2021 FINDINGS: CTA CHEST FINDINGS Cardiovascular: Unenhanced images of the chest demonstrate calcification of the aorta and coronary arteries. No evidence of intramural hematoma. Images obtained during arterial phase after intravenous contrast material administration demonstrate normal caliber thoracic aorta. No aortic dissection. Great vessel origins are patent. Normal heart size. No pericardial effusions. Central pulmonary arteries are patent without evidence of significant pulmonary embolus. Mediastinum/Nodes: Thyroid gland is unremarkable. Large esophageal hiatal hernia. Esophagus is decompressed. No significant lymphadenopathy. Lungs/Pleura: Mild atelectasis in the lungs. Focal  subpleural opacity in the right upper lung is unchanged since prior study. Tiny pulmonary nodules are unchanged. Musculoskeletal: Degenerative changes.  No acute bony abnormalities. Review of the MIP images confirms the above findings. CTA ABDOMEN AND PELVIS FINDINGS VASCULAR Aorta: Diffuse aortic calcification. Normal caliber abdominal aorta. No dissection. Celiac: Focal calcific stenosis of the origin of the celiac axis, representing about 50% diameter reduction. Prompt reconstitution. SMA: The superior mesenteric artery is small in caliber but appears patent. Focal proximal stenosis with about 40% diameter reduction. Renals: Renal arteries are patent bilaterally. No aneurysm or occlusion. Nephrograms are symmetrical. IMA: Inferior mesenteric artery is patent. Inflow: Patent without evidence of aneurysm, dissection, vasculitis or significant stenosis. Veins: No obvious venous abnormality within the limitations of this arterial phase study. Review of the MIP  images confirms the above findings. NON-VASCULAR Hepatobiliary: No focal liver abnormality is seen. No gallstones, gallbladder wall thickening, or biliary dilatation. Pancreas: Unremarkable. No pancreatic ductal dilatation or surrounding inflammatory changes. Spleen: Normal in size without focal abnormality. Adrenals/Urinary Tract: Adrenal glands are unremarkable. Kidneys are normal, without renal calculi, focal lesion, or hydronephrosis. Bladder is unremarkable. Stomach/Bowel: Stomach, small bowel, and colon are not abnormally distended. Partial right hemicolectomy with ileocolonic anastomosis. No wall thickening or inflammatory changes. Lymphatic: Nonspecific lymphadenopathy in the mesentery with mild mesenteric stranding. Largest lymph nodes measure up to about 8 mm short axis dimension. Appearance is nonspecific. Changes are more prominent than on the prior study. Possibly inflammatory or reactive change but early metastatic disease could potentially have this appearance. Reproductive: Status post hysterectomy. No adnexal masses. Other: No abdominal wall hernia or abnormality. No abdominopelvic ascites. Musculoskeletal: Degenerative changes. No acute bony abnormalities. Postoperative change in the left hip. Review of the MIP images confirms the above findings. IMPRESSION: 1. Aortic atherosclerosis. No evidence of aortic aneurysm or dissection. 2. No evidence of metastatic disease in the chest. 3. Postoperative right hemicolectomy with patent ileocolonic anastomosis. 4. Borderline prominence of mesenteric lymph nodes with mesenteric stranding, increased since prior study. Appearances are nonspecific. 5. Large esophageal hiatal hernia. 6. 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 Electronically Signed   By: Lucienne Capers M.D.   On: 02/12/2022 22:45   DG Chest Port 1 View  Result Date: 02/12/2022 CLINICAL DATA:  Chest pain EXAM: PORTABLE CHEST 1  VIEW COMPARISON:  07/24/2020 FINDINGS: Lungs are clear.  No pleural effusion or pneumothorax. The heart is normal in size.  Thoracic aortic atherosclerosis. Large hiatal hernia. IMPRESSION: No evidence of acute cardiopulmonary disease. Electronically Signed   By: Julian Hy M.D.   On: 02/12/2022 21:00    Procedures Procedures    Medications Ordered in ED Medications  magnesium sulfate IVPB 2 g 50 mL (2 g Intravenous New Bag/Given 02/12/22 2335)  morphine (PF) 4 MG/ML injection 4 mg (4 mg Intravenous Given 02/12/22 2024)  ondansetron (ZOFRAN) injection 4 mg (4 mg Intravenous Given 02/12/22 2024)  potassium chloride 10 mEq in 100 mL IVPB (0 mEq Intravenous Stopped 02/12/22 2335)  morphine (PF) 2 MG/ML injection 2 mg (2 mg Intravenous Given 02/12/22 2204)  ondansetron (ZOFRAN) injection 4 mg (4 mg Intravenous Given 02/12/22 2210)  iohexol (OMNIPAQUE) 350 MG/ML injection 80 mL (80 mLs Intravenous Contrast Given 02/12/22 2228)  alum & mag hydroxide-simeth (MAALOX/MYLANTA) 200-200-20 MG/5ML suspension 30 mL (30 mLs Oral Given 02/12/22 2255)    ED Course/ Medical Decision Making/ A&P  Medical Decision Making Amount and/or Complexity of Data Reviewed Labs: ordered. Radiology: ordered.  Risk OTC drugs. Prescription drug management. Decision regarding hospitalization.   This patient presents to the ED for concern of cp, this involves an extensive number of treatment options, and is a complaint that carries with it a high risk of complications and morbidity.  The differential diagnosis includes cardiac, aortic dissection, gi   Co morbidities that complicate the patient evaluation  gerd, skin cancer, anxiety, colon cancer (s/p colectomy and in remission), htn, and glaucoma   Additional history obtained:  Additional history obtained from epic chart review External records from outside source obtained and reviewed including family   Lab Tests:  I Ordered, and  personally interpreted labs.  The pertinent results include:  cbc nl, bmp with K low at 2.8, Cr 1.38  (chronic); trop 17 now up to 90, lip 94, mg low at 1,5   Imaging Studies ordered:  I ordered imaging studies including CXR  I independently visualized and interpreted imaging which showed  IMPRESSION:  No evidence of acute cardiopulmonary disease.  CT chest/abd/pelvis: IMPRESSION:  1. Aortic atherosclerosis. No evidence of aortic aneurysm or  dissection.  2. No evidence of metastatic disease in the chest.  3. Postoperative right hemicolectomy with patent ileocolonic  anastomosis.  4. Borderline prominence of mesenteric lymph nodes with mesenteric  stranding, increased since prior study. Appearances are nonspecific.  5. Large esophageal hiatal hernia.  6. 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   I agree with the radiologist interpretation   Cardiac Monitoring:  The patient was maintained on a cardiac monitor.  I personally viewed and interpreted the cardiac monitored which showed an underlying rhythm of: nsr   Medicines ordered and prescription drug management:  I ordered medication including morphine/zofran  for pain and nausea  Reevaluation of the patient after these medicines showed that the patient improved I have reviewed the patients home medicines and have made adjustments as needed   Test Considered:  Ct dissection   Critical Interventions:  Pain control   Consultations Obtained:  I requested consultation with the hospitalist (Dr. Roel Cluck),  and discussed lab and imaging findings as well as pertinent plan -she requests cards consult. I spoke with cards who will see pt in consult.  He recommends heparin.  He will see pt in consult.   Problem List / ED Course:  CP:  EKG without changes; initial trop neg; CT chest/abd/pelvis neg for dissection Hypokalemia:  Kcl given Hypomag: mg  given   Reevaluation:  After the interventions noted above, I reevaluated the patient and found that they have :improved   Social Determinants of Health:  Lives in independent living   Dispostion:  After consideration of the diagnostic results and the patients response to treatment, I feel that the patent would benefit from admission for obs.          Final Clinical Impression(s) / ED Diagnoses Final diagnoses:  Hypokalemia  Chest pain, unspecified type  Hiatal hernia  Hypomagnesemia    Rx / DC Orders ED Discharge Orders     None         Isla Pence, MD 02/12/22 2339

## 2022-02-12 NOTE — Assessment & Plan Note (Signed)
-   H=   1  ,E= 2 ,A=  2   , R  1  , T   1    for the  Total of 7 therefore will admit for observation and further evaluation ( Risk of MACE: Scores 0-3  of 0.9-1.7%.,  4-6: 12-16.6% , Scores ?7: 50-65% ) - CT   negative for significant PE  - troponin elevated at 90 continue to follow cardiology aware -EKG showing left bundle branch block history of the same in the past  - Other explanation for chest pain could be GI related    - monitor on telemetry, cycle cardiac enzymes, obtain serial ECG and  ECHO in AM.   - Daily aspirin -  Further risk stratify with lipid panel, hgA1C, obtain TSH.  Make sure patient is on Aspirin.  We will notify cardiology regarding patient's admission. Further management depends on pending  workup

## 2022-02-12 NOTE — ED Notes (Signed)
Patient denies pain and is resting comfortably.  

## 2022-02-12 NOTE — H&P (Signed)
Amber Park HRC:[id number] DOB: 11/27/1924 DOA: 02/12/2022     PCP: Lajean Manes, MD   Outpatient Specialists:     Oncology   Dr. Learta Codding GI Dr. Rush Landmark   LB  Patient arrived to ER on 02/12/22 at 1958 Referred by Attending Isla Pence, MD   Patient coming from:     From [facility] [place]  Chief Complaint:   Chief Complaint  Patient presents with   Chest Pain    HPI: Amber Park is a 88 y.o. female with medical history significant of GERD, colon cancer status post resection in remission, HTN, glaucoma history of AVMs history of iron deficiency anemia in the past, hiatal hernia    Presented with   chest pain Presents with chest pain since afternoon radiates to the abdomen and back.  EMS was called gave aspirin 324 mg and 2 sublingual nitros.  Chest pain is improved down to 5 out of 9.    Patient still feels fairly uncomfortable but no shortness of breath no nausea no vomiting no fever no cough  She was having some CP/back pain before dinner she tried to eat dinner but it made it worse it radiated to her back and stomach Reports last stress test was 20 years ago it was ok She used to smoke quit 25y ago Now CP free    She has a few  BM a day    Regarding pertinent Chronic problems:      HTN on Norvasc hydrochlorothiazide     CKD stage IIIa- baseline Cr 1.3 Estimated Creatinine Clearance: 21.8 mL/min (A) (by C-G formula based on SCr of 1.38 mg/dL (H)).  Lab Results  Component Value Date   CREATININE 1.38 (H) 02/12/2022   CREATININE 1.12 (H) 02/05/2021   CREATININE 1.33 (H) 07/24/2020    While in ER:   Noted to have potassium down to 2.8 patient is.  Demented home.  Troponin 17 lipase slightly elevated 94 but no evidence of pancreatitis on CT  CXR -  NON acute  CT  chestCTabd/pelvis -no dissection large hiatal hernia increased mesenteric lymphadenopathy    Following Medications were ordered in ER: Medications  morphine (PF)  4 MG/ML injection 4 mg (4 mg Intravenous Given 02/12/22 2024)  ondansetron (ZOFRAN) injection 4 mg (4 mg Intravenous Given 02/12/22 2024)  potassium chloride 10 mEq in 100 mL IVPB (10 mEq Intravenous New Bag/Given 02/12/22 2206)  morphine (PF) 2 MG/ML injection 2 mg (2 mg Intravenous Given 02/12/22 2204)  ondansetron (ZOFRAN) injection 4 mg (4 mg Intravenous Given 02/12/22 2210)  iohexol (OMNIPAQUE) 350 MG/ML injection 80 mL (80 mLs Intravenous Contrast Given 02/12/22 2228)  alum & mag hydroxide-simeth (MAALOX/MYLANTA) 200-200-20 MG/5ML suspension 30 mL (30 mLs Oral Given 02/12/22 2255)    _______________________________________________________ ER Provider Called:   Cardiac  Dr. Marcelle Smiling They Recommend admit to medicine    SEEN in ER   ED Triage Vitals [02/12/22 2000]  Enc Vitals Group     BP (!) 183/65     Pulse Rate 93     Resp 18     Temp 97.9 F (36.6 C)     Temp Source Oral     SpO2 98 %     Weight      Height      Head Circumference      Peak Flow      Pain Score 10     Pain Loc      Pain Edu?  Excl. in [place]?   WCBJ(62)@     _________________________________________ Significant initial  Findings: Abnormal Labs Reviewed  BASIC METABOLIC PANEL - Abnormal; Notable for the following components:      Result Value   Potassium 2.8 (*)    Glucose, Bld 139 (*)    Creatinine, Ser 1.38 (*)    GFR, Estimated 35 (*)    All other components within normal limits  LIPASE, BLOOD - Abnormal; Notable for the following components:   Lipase 94 (*)    All other components within normal limits     _________________________ Troponin 17 ECG: Ordered Personally reviewed and interpreted by me showing: HR : 79 Rhythm: Sinus rhythm Atrial premature complexes Right atrial enlargement Left bundle branch block QTC 490    The recent clinical data is shown below. Vitals:   02/12/22 2100 02/12/22 2130 02/12/22 2205 02/12/22 2245  BP: (!) 169/60 (!) 157/61 (!) 162/64 (!) 161/64  Pulse: 98  86 97 92  Resp: (!) 23 (!) 24  (!) 22  Temp:      TempSrc:      SpO2: 90% (!) 88% 93% 92%     WBC     Component Value Date/Time   WBC 9.8 02/12/2022 2009   LYMPHSABS 1.6 02/04/2022 1419   MONOABS 0.9 02/04/2022 1419   EOSABS 0.1 02/04/2022 1419   BASOSABS 0.1 02/04/2022 1419    Lactic Acid, Venous    Component Value Date/Time   LATICACIDVEN 1.4 07/24/2020 2058       UA   ordered    _______________________________________________ Hospitalist was called for admission for   Hypokalemia hypomagnesemia    Chest pain, unspecified type  Hiatal hernia    The following Work up has been ordered so far:  Orders Placed This Encounter  Procedures   DG Chest Port 1 View   CT Angio Chest/Abd/Pel for Dissection W and/or Wo Contrast   Basic metabolic panel   CBC   Hepatic function panel   Lipase, blood   Magnesium   Cardiac monitoring   Consult to hospitalist   Pulse oximetry, continuous   EKG 12-Lead   ED EKG   EKG 12-Lead   Insert peripheral IV     OTHER Significant initial  Findings:  labs showing:  Recent Labs  Lab 02/12/22 2009  NA 136  K 2.8*  CO2 25  GLUCOSE 139*  BUN 21  CREATININE 1.38*  CALCIUM 9.3    Cr   stable,    Lab Results  Component Value Date   CREATININE 1.38 (H) 02/12/2022   CREATININE 1.12 (H) 02/05/2021   CREATININE 1.33 (H) 07/24/2020    Recent Labs  Lab 02/12/22 2009  AST 33  ALT 17  ALKPHOS 97  BILITOT 0.4  PROT 6.6  ALBUMIN 3.8   Lab Results  Component Value Date   CALCIUM 9.3 02/12/2022    Plt: Lab Results  Component Value Date   PLT 295 02/12/2022       COVID-19 Labs  No results for input(s): "DDIMER", "FERRITIN", "LDH", "CRP" in the last 72 hours.  Lab Results  Component Value Date   SARSCOV2NAA NEGATIVE 07/24/2020   SARSCOV2NAA NEGATIVE 02/14/2020   SARSCOV2NAA NEGATIVE 02/07/2020   [place] NEGATIVE 12/30/2019    Recent Labs  Lab 02/12/22 2009  WBC 9.8  HGB 14.4  HCT 41.3  MCV 81.5  PLT  295    HG/HCT  stable,     Component Value Date/Time   HGB 14.4 02/12/2022 2009  HGB 15.5 (H) 02/04/2022 1419   HCT 41.3 02/12/2022 2009   MCV 81.5 02/12/2022 2009     Recent Labs  Lab 02/12/22 2009  LIPASE 94*           Cultures:    Component Value Date/Time   SDES  07/24/2020 2248    URINE, RANDOM Performed at [facility], [place] [address], [place], [place] [address]    SPECREQUEST  07/24/2020 2248    NONE Performed at [facility], [place] [address], [place], [place] [address]    CULT (A) 07/24/2020 2248    10,000 COLONIES/mL MULTIPLE SPECIES PRESENT, SUGGEST RECOLLECTION   REPTSTATUS 07/26/2020 FINAL 07/24/2020 2248     Radiological Exams on Admission: CT Angio Chest/Abd/Pel for Dissection W and/or Wo Contrast  Result Date: 02/12/2022 CLINICAL DATA:  Chest or back pain with aortic dissection suspected. Substernal chest pain this afternoon radiating to the abdomen. EXAM: CT ANGIOGRAPHY CHEST, ABDOMEN AND PELVIS TECHNIQUE: Non-contrast CT of the chest was initially obtained. Multidetector CT imaging through the chest, abdomen and pelvis was performed using the standard protocol during bolus administration of intravenous contrast. Multiplanar reconstructed images and MIPs were obtained and reviewed to evaluate the vascular anatomy. RADIATION DOSE REDUCTION: This exam was performed according to the departmental dose-optimization program which includes automated exposure control, adjustment of the mA and/or kV according to patient size and/or use of iterative reconstruction technique. CONTRAST:  75m OMNIPAQUE IOHEXOL 350 MG/ML SOLN COMPARISON:  02/05/2021 FINDINGS: CTA CHEST FINDINGS Cardiovascular: Unenhanced images of the chest demonstrate calcification of the aorta and coronary arteries. No evidence of intramural hematoma. Images obtained during arterial phase after intravenous contrast material administration demonstrate normal caliber  thoracic aorta. No aortic dissection. Great vessel origins are patent. Normal heart size. No pericardial effusions. Central pulmonary arteries are patent without evidence of significant pulmonary embolus. Mediastinum/Nodes: Thyroid gland is unremarkable. Large esophageal hiatal hernia. Esophagus is decompressed. No significant lymphadenopathy. Lungs/Pleura: Mild atelectasis in the lungs. Focal subpleural opacity in the right upper lung is unchanged since prior study. Tiny pulmonary nodules are unchanged. Musculoskeletal: Degenerative changes.  No acute bony abnormalities. Review of the MIP images confirms the above findings. CTA ABDOMEN AND PELVIS FINDINGS VASCULAR Aorta: Diffuse aortic calcification. Normal caliber abdominal aorta. No dissection. Celiac: Focal calcific stenosis of the origin of the celiac axis, representing about 50% diameter reduction. Prompt reconstitution. SMA: The superior mesenteric artery is small in caliber but appears patent. Focal proximal stenosis with about 40% diameter reduction. Renals: Renal arteries are patent bilaterally. No aneurysm or occlusion. Nephrograms are symmetrical. IMA: Inferior mesenteric artery is patent. Inflow: Patent without evidence of aneurysm, dissection, vasculitis or significant stenosis. Veins: No obvious venous abnormality within the limitations of this arterial phase study. Review of the MIP images confirms the above findings. NON-VASCULAR Hepatobiliary: No focal liver abnormality is seen. No gallstones, gallbladder wall thickening, or biliary dilatation. Pancreas: Unremarkable. No pancreatic ductal dilatation or surrounding inflammatory changes. Spleen: Normal in size without focal abnormality. Adrenals/Urinary Tract: Adrenal glands are unremarkable. Kidneys are normal, without renal calculi, focal lesion, or hydronephrosis. Bladder is unremarkable. Stomach/Bowel: Stomach, small bowel, and colon are not abnormally distended. Partial right hemicolectomy with  ileocolonic anastomosis. No wall thickening or inflammatory changes. Lymphatic: Nonspecific lymphadenopathy in the mesentery with mild mesenteric stranding. Largest lymph nodes measure up to about 8 mm short axis dimension. Appearance is nonspecific. Changes are more prominent than on the prior study. Possibly inflammatory or reactive change but early metastatic disease  could potentially have this appearance. Reproductive: Status post hysterectomy. No adnexal masses. Other: No abdominal wall hernia or abnormality. No abdominopelvic ascites. Musculoskeletal: Degenerative changes. No acute bony abnormalities. Postoperative change in the left hip. Review of the MIP images confirms the above findings. IMPRESSION: 1. Aortic atherosclerosis. No evidence of aortic aneurysm or dissection. 2. No evidence of metastatic disease in the chest. 3. Postoperative right hemicolectomy with patent ileocolonic anastomosis. 4. Borderline prominence of mesenteric lymph nodes with mesenteric stranding, increased since prior study. Appearances are nonspecific. 5. Large esophageal hiatal hernia. 6. 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 Electronically Signed   By: Lucienne Capers M.D.   On: 02/12/2022 22:45   DG Chest Port 1 View  Result Date: 02/12/2022 CLINICAL DATA:  Chest pain EXAM: PORTABLE CHEST 1 VIEW COMPARISON:  07/24/2020 FINDINGS: Lungs are clear.  No pleural effusion or pneumothorax. The heart is normal in size.  Thoracic aortic atherosclerosis. Large hiatal hernia. IMPRESSION: No evidence of acute cardiopulmonary disease. Electronically Signed   By: Julian Hy M.D.   On: 02/12/2022 21:00   _______________________________________________________________________________________________________ Latest  Blood pressure (!) 161/64, pulse 92, temperature 97.9 F (36.6 C), temperature source Oral, resp. rate (!) 22, SpO2 92 %.   Vitals  labs and radiology  finding personally reviewed  Review of Systems:     Pertinent positives include:  chest pain,  Constitutional:  No weight loss, night sweats, Fevers, chills, fatigue, weight loss  HEENT:  No headaches, Difficulty swallowing,Tooth/dental problems,Sore throat,  No sneezing, itching, ear ache, nasal congestion, post nasal drip,  Cardio-vascular:  No Orthopnea, PND, anasarca, dizziness, palpitations.no Bilateral lower extremity swelling  GI:  No heartburn, indigestion, abdominal pain, nausea, vomiting, diarrhea, change in bowel habits, loss of appetite, melena, blood in stool, hematemesis Resp:  no shortness of breath at rest. No dyspnea on exertion, No excess mucus, no productive cough, No non-productive cough, No coughing up of blood.No change in color of mucus.No wheezing. Skin:  no rash or lesions. No jaundice GU:  no dysuria, change in color of urine, no urgency or frequency. No straining to urinate.  No flank pain.  Musculoskeletal:  No joint pain or no joint swelling. No decreased range of motion. No back pain.  Psych:  No change in mood or affect. No depression or anxiety. No memory loss.  Neuro: no localizing neurological complaints, no tingling, no weakness, no double vision, no gait abnormality, no slurred speech, no confusion  All systems reviewed and apart from [place] all are negative _______________________________________________________________________________________________ Past Medical History:   Past Medical History:  Diagnosis Date   Anemia    iron deficiency   Anxiety    Arthritis    AVM (arteriovenous malformation)    colon, stomach and bowel ( per progress note)   Cancer (HCC)    hx of skin cancer removed from nose    Family history of adverse reaction to anesthesia    Pt son has PONV   GERD (gastroesophageal reflux disease)    Glaucoma    Hearing aid worn    B/L   History of blood transfusion 1963   History of hiatal hernia    History of left bundle  branch block    old ekg 06-23-11 dr Felipa Eth on chart   HOH (hard of hearing)    Hypertension    Pneumonia    hx of pneumonia x 4    PONV (postoperative nausea and vomiting)    Shortness of  breath dyspnea    with exertion    Wears glasses       Past Surgical History:  Procedure Laterality Date   ABDOMINAL HYSTERECTOMY     BACK SURGERY     x 2   BIOPSY  01/03/2020   Procedure: BIOPSY;  Surgeon: Rush Landmark Telford Nab., MD;  Location: [facility];  Service: Gastroenterology;;   BREAST SURGERY     bilateral cyst removal    COLONOSCOPY WITH PROPOFOL N/A 01/03/2020   Procedure: COLONOSCOPY WITH PROPOFOL;  Surgeon: Irving Copas., MD;  Location: [facility];  Service: Gastroenterology;  Laterality: N/A;   ENTEROSCOPY N/A 09/16/2018   Procedure: ENTEROSCOPY;  Surgeon: Rush Landmark Telford Nab., MD;  Location: [facility];  Service: Gastroenterology;  Laterality: N/A;   ENTEROSCOPY N/A 01/03/2020   Procedure: ENTEROSCOPY;  Surgeon: Rush Landmark Telford Nab., MD;  Location: [facility];  Service: Gastroenterology;  Laterality: N/A;   HOT HEMOSTASIS N/A 09/16/2018   Procedure: HOT HEMOSTASIS (ARGON PLASMA COAGULATION/BICAP);  Surgeon: Irving Copas., MD;  Location: [facility];  Service: Gastroenterology;  Laterality: N/A;   LAPAROSCOPIC PARTIAL COLECTOMY N/A 02/10/2020   Procedure: LAPAROSCOPIC PARTIAL COLECTOMY;  Surgeon: Leighton Ruff, MD;  Location: [facility];  Service: General;  Laterality: N/A;   POLYPECTOMY  01/03/2020   Procedure: POLYPECTOMY;  Surgeon: Rush Landmark Telford Nab., MD;  Location: [facility];  Service: Gastroenterology;;   SUBMUCOSAL TATTOO INJECTION  01/03/2020   Procedure: SUBMUCOSAL TATTOO INJECTION;  Surgeon: Irving Copas., MD;  Location: [facility];  Service: Gastroenterology;;   TONSILLECTOMY     TOTAL HIP ARTHROPLASTY Left 05/31/2014   Procedure: LEFT TOTAL HIP ARTHROPLASTY ANTERIOR APPROACH;  Surgeon: Mauri Pole, MD;  Location: [facility];  Service: Orthopedics;  Laterality: Left;    Social History:  Ambulatory   walker     reports that she quit smoking about 18 years ago. Her smoking use included cigarettes. She started smoking about 68 years ago. She smoked an average of .3 packs per day. She has never used smokeless tobacco. She reports that she does not currently use alcohol. She reports that she does not use drugs.     Family History:   Family History  Problem Relation Age of Onset   Colon cancer Mother 33       Died age 79   Heart attack Father 59       Died in his sleep of an MI   Lung cancer Sister        LLL resection, otherwise no treatment; now recurred   Bladder Cancer Sister        Likely primary   Renal cancer Sister    Breast cancer Sister    Heart disease Brother    Heart disease Brother    Esophageal cancer Neg Hx    Inflammatory bowel disease Neg Hx    Liver disease Neg Hx    Pancreatic cancer Neg Hx    Stomach cancer Neg Hx    ______________________________________________________________________________________________ Allergies: Allergies  Allergen Reactions   Ferrous Gluconate     Back and chest pain, nausea, diarrhea, chills, hypotension, presyncope   Anesthesia S-I-40 [Propofol] Nausea And Vomiting    Anesthetics -- N/V   Codeine Nausea And Vomiting   Meperidine Hcl Nausea And Vomiting   Pneumococcal Vac Polyvalent     Other reaction(s): red and swollen   Simvastatin     Other reaction(s): muscle pain     Prior to Admission medications   Medication Sig Start Date End  Date Taking? Authorizing Provider  acetaminophen (TYLENOL) 500 MG tablet Take 500 mg by mouth at bedtime.    [provider]  amLODipine (NORVASC) 10 MG tablet Take 10 mg by mouth daily. 08/28/18   [provider]  Cholecalciferol (VITAMIN D3) 50 MCG (2000 UT) capsule Take 2,000 Units by mouth daily.    [provider]  hydrochlorothiazide (HYDRODIURIL) 25 MG tablet Take 25 mg by  mouth daily. 06/15/19   [provider]  hydrocortisone cream 1 % Apply 1 application topically daily as needed for itching. Patient not taking: Reported on 07/28/2020    [provider]  latanoprost (XALATAN) 0.005 % ophthalmic solution Place 1 drop into both eyes at bedtime.  09/11/18   [provider]  loperamide (IMODIUM A-D) 2 MG tablet Take 2 mg by mouth 4 (four) times daily as needed for diarrhea or loose stools.    [provider]  ondansetron (ZOFRAN) 4 MG tablet Take 1 tablet (4 mg total) by mouth every 6 (six) hours as needed for nausea or vomiting. Patient not taking: Reported on 07/28/2020 07/24/20   Gareth Morgan, MD  pantoprazole (PROTONIX) 40 MG tablet TAKE 1 TABLET(40 MG) BY MOUTH DAILY 12/19/21   Mansouraty, Telford Nab., MD  potassium chloride (KLOR-CON) 10 MEQ tablet Take 10 mEq by mouth daily.    [provider]  tiZANidine (ZANAFLEX) 4 MG tablet Take 1 tablet (4 mg total) by mouth every 8 (eight) hours as needed for muscle spasms. Patient taking differently: Take 4 mg by mouth at bedtime. 06/02/14   Danae Orleans, PA-C    ___________________________________________________________________________________________________ Physical Exam:    02/12/2022   10:45 PM 02/12/2022   10:05 PM 02/12/2022    9:30 PM  Vitals with BMI  Systolic 902 409 735  Diastolic 64 64 61  Pulse 92 97 86     1. General:  in No  Acute distress    Chronically ill  -appearing 2. Psychological: Alert and    Oriented 3. Head/ENT:    Dry Mucous Membranes                          Head Non traumatic, neck supple                          Poor Dentition 4. SKIN:  decreased Skin turgor,  Skin clean Dry and intact no rash 5. Heart: Regular rate and rhythm no  Murmur, no Rub or gallop 6. Lungs:  Clear to auscultation bilaterally, no wheezes or crackles   7. Abdomen: Soft,  epigastric-tender, Non distended   obese  bowel sounds present 8. Lower extremities: no  clubbing, cyanosis, no  edema 9. Neurologically Grossly intact, moving all 4 extremities equally   10. MSK: Normal range of motion    Chart has been reviewed  ______________________________________________________________________________________________  Assessment/Plan  88 y.o. female with medical history significant of GERD, colon cancer status post resection in remission, HTN, glaucoma history of AVMs history of iron deficiency anemia in the past, hiatal hernia   Admitted for NSTEMI  Hypokalemia  Hiatal hernia     Present on Admission:  Colon cancer, ascending ([place])  GERD  HTN (hypertension)  Hypokalemia  Hypomagnesemia  (Resolved) Chest pain  NSTEMI (non-ST elevated myocardial infarction) ([place])     Colon cancer, ascending ([place]) Chronic stable patient is followed by Dr. Benay Spice  GERD Continue Protonix increased to twice daily dosing 40  mg  Hiatal hernia May be contributing to chest pain presentation.  HTN (hypertension) Continue Norvasc 10 mg p.o. daily  Hypokalemia Replace and follow check magnesium level  Hypomagnesemia We will replace and recheck in a.m.  Chest pain - H=   1  ,E= 2 ,A=  2   , R  1  , T   1    for the  Total of 7 therefore will admit for observation and further evaluation ( Risk of MACE: Scores 0-3  of 0.9-1.7%.,  4-6: 12-16.6% , Scores ?7: 50-65% ) - CT   negative for significant PE  - troponin elevated at 90 continue to follow cardiology aware -EKG showing left bundle branch block history of the same in the past  - Other explanation for chest pain could be GI related    - monitor on telemetry, cycle cardiac enzymes, obtain serial ECG and  ECHO in AM.   - Daily aspirin -  Further risk stratify with lipid panel, hgA1C, obtain TSH.  Make sure patient is on Aspirin.  We will notify cardiology regarding patient's admission. Further management depends on pending  workup   NSTEMI (non-ST elevated myocardial infarction) ([place])  H=   1  ,E= 2  ,A=  2   , R  1  , T   1    for the  Total of 7 therefore will admit for observation and further evaluation ( Risk of MACE: Scores 0-3  of 0.9-1.7%.,  4-6: 12-16.6% , Scores ?7: 50-65% ) - CT   negative for significant PE  - troponin elevated at 90 continue to follow cardiology aware -EKG showing left bundle branch block history of the same in the past  - Other explanation for chest pain could be GI related    - monitor on telemetry, cycle cardiac enzymes, obtain serial ECG and  ECHO in AM.   - Daily aspirin -  Further risk stratify with lipid panel, hgA1C, obtain TSH.  Make sure patient is on Aspirin.  We will notify cardiology regarding patient's admission. Further management depends on pending  workup Cardiology recommends starting heparin Keep n.p.o. postmidnight in case will need any further interventions   Other plan as per orders.  DVT prophylaxis:  heparin    Code Status:    Code Status: Prior FULL CODE    as per patient   I had personally discussed CODE STATUS with patient and family      Family Communication:   Family   at  Bedside  plan of care was discussed  with Daughter,    Disposition Plan:                              Back to current facility when stable                          Following barriers for discharge:                            Electrolytes corrected                                                          Pain controlled with  PO medications                                                           Will need consultants to evaluate patient prior to discharge                       Would benefit from PT/OT eval prior to DC  Ordered                   Transitional consult                    Consults called: Cardiology are aware  Admission status:  ED Disposition     ED Disposition  [place]: [facility] [[id number]]  Level of Care: Progressive [102]  Admit to Progressive based on following  criteria: CARDIOVASCULAR & THORACIC of moderate stability with acute coronary syndrome symptoms/low risk myocardial infarction/hypertensive urgency/arrhythmias/heart failure potentially compromising stability and stable post cardiovascular intervention patients.  May place patient in observation at [facility] or [facility] if equivalent level of care is available:: No  Covid Evaluation: Asymptomatic - no recent exposure (last 10 days) testing not required  Diagnosis: NSTEMI (non-ST elevated myocardial infarction) [facility]) [[id number]]  Admitting Physician: Toy Baker [[id number]]  Attending Physician: Toy Baker [[id number]]            Obs      Level of care     progressive tele indefinitely please discontinue once patient no longer qualifies COVID-19 Labs   Amber Park [place] 02/13/2022, 12:00 AM    [facility]     after 2 AM please page floor coverage PA If 7AM-7PM, please contact the day team taking care of the patient using [email or web address]   Patient was evaluated in the context of the global COVID-19 pandemic, which necessitated consideration that the patient might be at risk for infection with the SARS-CoV-2 virus that causes COVID-19. Institutional protocols and algorithms that pertain to the evaluation of patients at risk for COVID-19 are in a state of rapid change based on information released by regulatory bodies including the CDC and federal and state organizations. These policies and algorithms were followed during the patient's care.

## 2022-02-12 NOTE — Subjective & Objective (Signed)
Presents with chest pain since afternoon radiates to the abdomen and back.  EMS was called gave aspirin 324 mg and 2 sublingual nitros.  Chest pain is improved down to 5 out of 9.    Patient still feels fairly uncomfortable but no shortness of breath no nausea no vomiting no fever no cough

## 2022-02-12 NOTE — Consult Note (Signed)
Cardiology Consultation:   Patient ID: Amber Park MRN: [id number]; DOB: [birth year]  Admit date: 02/12/2022 Date of Consult: 02/12/2022  Primary Care Provider: Lajean Manes, Park Primary Cardiologist: None  Primary Electrophysiologist:  None    Patient Profile:   Amber Park is a 88 y.o. female with a hx of colon cancer status post right colectomy, hypertension, GERD who is being seen today for the evaluation of chest pain at the request of emergency department.  History of Present Illness:   Ms. Court is a 88 year old female with a history of colon cancer status post right colectomy, hypertension, GERD who presented after sudden onset chest pain earlier in the afternoon.  Pain was substernal but radiated to her abdomen and to her back.  She was brought in by EMS and was given aspirin and nitroglycerin with minimal relief in her pain.  She does not have known coronary disease.  On arrival, she was hemodynamically stable with blood pressures elevated.  Notable labs include a potassium of 2.8, troponin that went from 17-90.  Left bundle branch block on ECG so difficult to interpret.  Cardiology was consulted for management of NSTEMI.  She was given morphine by the emergency department providers and now her chest pain has largely improved.  Of note, her cancer is in remission. She lives independently and takes care of her self. Still drives.   Past Medical History:  Diagnosis Date   Anemia    iron deficiency   Anxiety    Arthritis    AVM (arteriovenous malformation)    colon, stomach and bowel ( per progress note)   Cancer (HCC)    hx of skin cancer removed from nose    Family history of adverse reaction to anesthesia    Pt son has PONV   GERD (gastroesophageal reflux disease)    Glaucoma    Hearing aid worn    B/L   History of blood transfusion 1963   History of hiatal hernia    History of left bundle branch block    old ekg 06-23-11 dr Felipa Eth on chart   HOH (hard  of hearing)    Hypertension    Pneumonia    hx of pneumonia x 4    PONV (postoperative nausea and vomiting)    Shortness of breath dyspnea    with exertion    Wears glasses     Past Surgical History:  Procedure Laterality Date   ABDOMINAL HYSTERECTOMY     BACK SURGERY     x 2   BIOPSY  01/03/2020   Procedure: BIOPSY;  Surgeon: Amber Park., Park;  Location: [facility] ENDOSCOPY;  Service: Gastroenterology;;   BREAST SURGERY     bilateral cyst removal    COLONOSCOPY WITH PROPOFOL N/A 01/03/2020   Procedure: COLONOSCOPY WITH PROPOFOL;  Surgeon: Amber Park., Park;  Location: [facility] ENDOSCOPY;  Service: Gastroenterology;  Laterality: N/A;   ENTEROSCOPY N/A 09/16/2018   Procedure: ENTEROSCOPY;  Surgeon: Amber Landmark Telford Nab., Park;  Location: [place];  Service: Gastroenterology;  Laterality: N/A;   ENTEROSCOPY N/A 01/03/2020   Procedure: ENTEROSCOPY;  Surgeon: Amber Landmark Telford Nab., Park;  Location: [place];  Service: Gastroenterology;  Laterality: N/A;   HOT HEMOSTASIS N/A 09/16/2018   Procedure: HOT HEMOSTASIS (ARGON PLASMA COAGULATION/BICAP);  Surgeon: Amber Park., Park;  Location: [place];  Service: Gastroenterology;  Laterality: N/A;   LAPAROSCOPIC PARTIAL COLECTOMY N/A 02/10/2020   Procedure: LAPAROSCOPIC PARTIAL COLECTOMY;  Surgeon: Amber Ruff, Park;  Location: [facility];  Service: General;  Laterality: N/A;   POLYPECTOMY  01/03/2020   Procedure: POLYPECTOMY;  Surgeon: Amber Landmark Telford Nab., Park;  Location: [place];  Service: Gastroenterology;;   SUBMUCOSAL TATTOO INJECTION  01/03/2020   Procedure: SUBMUCOSAL TATTOO INJECTION;  Surgeon: Amber Park., Park;  Location: [place];  Service: Gastroenterology;;   TONSILLECTOMY     TOTAL HIP ARTHROPLASTY Left 05/31/2014   Procedure: LEFT TOTAL HIP ARTHROPLASTY ANTERIOR APPROACH;  Surgeon: Mauri Pole, Park;  Location: [facility];  Service: Orthopedics;  Laterality: Left;     Home Medications:   Prior to Admission medications   Medication Sig Start Date End Date Taking? Authorizing Provider  acetaminophen (TYLENOL) 500 MG tablet Take 500 mg by mouth at bedtime.    Provider, Historical, Park  amLODipine (NORVASC) 10 MG tablet Take 10 mg by mouth daily. 08/28/18   Provider, Historical, Park  Cholecalciferol (VITAMIN D3) 50 MCG (2000 UT) capsule Take 2,000 Units by mouth daily.    Provider, Historical, Park  hydrochlorothiazide (HYDRODIURIL) 25 MG tablet Take 25 mg by mouth daily. 06/15/19   Provider, Historical, Park  hydrocortisone cream 1 % Apply 1 application topically daily as needed for itching. Patient not taking: Reported on 07/28/2020    Provider, Historical, Park  latanoprost (XALATAN) 0.005 % ophthalmic solution Place 1 drop into both eyes at bedtime.  09/11/18   Provider, Historical, Park  loperamide (IMODIUM A-D) 2 MG tablet Take 2 mg by mouth 4 (four) times daily as needed for diarrhea or loose stools.    Provider, Historical, Park  ondansetron (ZOFRAN) 4 MG tablet Take 1 tablet (4 mg total) by mouth every 6 (six) hours as needed for nausea or vomiting. Patient not taking: Reported on 07/28/2020 07/24/20   Amber Morgan, Park  pantoprazole (PROTONIX) 40 MG tablet TAKE 1 TABLET(40 MG) BY MOUTH DAILY 12/19/21   Mansouraty, Telford Nab., Park  potassium chloride (KLOR-CON) 10 MEQ tablet Take 10 mEq by mouth daily.    Provider, Historical, Park  tiZANidine (ZANAFLEX) 4 MG tablet Take 1 tablet (4 mg total) by mouth every 8 (eight) hours as needed for muscle spasms. Patient taking differently: Take 4 mg by mouth at bedtime. 06/02/14   Amber Orleans, PA-C    Inpatient Medications: Scheduled Meds:  Continuous Infusions:  magnesium sulfate bolus IVPB 2 g (02/12/22 2335)   PRN Meds:   Allergies:    Allergies  Allergen Reactions   Ferrous Gluconate     Back and chest pain, nausea, diarrhea, chills, hypotension, presyncope   Anesthesia S-I-40 [Propofol] Nausea And Vomiting    Anesthetics -- N/V    Codeine Nausea And Vomiting   Meperidine Hcl Nausea And Vomiting   Pneumococcal Vac Polyvalent     Other reaction(s): red and swollen   Simvastatin     Other reaction(s): muscle pain    Social History:   Social History   Socioeconomic History   Marital status: Widowed    Spouse name: Not on file   Number of children: 3   Years of education: Not on file   Highest education level: Not on file  Occupational History   Occupation: Retired Designer, multimedia at [employer] Use   Smoking status: Former    Packs/day: 0.30    Types: Cigarettes    Start date: 1955    Quit date: 2005    Years since quitting: 18.5   Smokeless tobacco: Never  Vaping Use   Vaping Use: Never used  Substance and Sexual Activity  Alcohol use: Not Currently    Comment: rare    Drug use: No   Sexual activity: Not on file  Other Topics Concern   Not on file  Social History Narrative   Not on file   Social Determinants of Health   Financial Resource Strain: Not on file  Food Insecurity: Not on file  Transportation Needs: Not on file  Physical Activity: Not on file  Stress: Not on file  Social Connections: Not on file  Intimate Partner Violence: Not on file    Family History:    Family History  Problem Relation Age of Onset   Colon cancer Mother 55       Died age 61   Heart attack Father 80       Died in his sleep of an MI   Lung cancer Sister        LLL resection, otherwise no treatment; now recurred   Bladder Cancer Sister        Likely primary   Renal cancer Sister    Breast cancer Sister    Heart disease Brother    Heart disease Brother    Esophageal cancer Neg Hx    Inflammatory bowel disease Neg Hx    Liver disease Neg Hx    Pancreatic cancer Neg Hx    Stomach cancer Neg Hx      Review of Systems: Point review systems negative unless otherwise noted in the HPI  Physical Exam/Data:   Vitals:   02/12/22 2130 02/12/22 2205 02/12/22 2245 02/12/22 2300  BP: (!)  157/61 (!) 162/64 (!) 161/64 (!) 162/67  Pulse: 86 97 92 93  Resp: (!) 24  (!) 22 19  Temp:      TempSrc:      SpO2: (!) 88% 93% 92% 92%    Intake/Output Summary (Last 24 hours) at 02/12/2022 2336 Last data filed at 02/12/2022 2335 Gross per 24 hour  Intake 100 ml  Output --  Net 100 ml   There were no vitals filed for this visit. There is no height or weight on file to calculate BMI.  General:   in no acute distress HEENT: normal Lymph: no adenopathy Neck: no JVD Endocrine:  No thryomegaly Vascular: No carotid bruits; FA pulses 2+ bilaterally without bruits  Cardiac:  normal S1, S2; RRR; no murmur  Lungs:  clear to auscultation bilaterally, no wheezing, rhonchi or rales  Abd: soft, nontender, no hepatomegaly  Ext: no edema Musculoskeletal:  No deformities, BUE and BLE strength normal and equal Skin: warm and dry  Neuro:  CNs 2-12 intact, no focal abnormalities noted Psych:  Normal affect   EKG:  The EKG was personally reviewed and demonstrates: LBBB no marked ST changes Telemetry:  Telemetry was personally reviewed and demonstrates: Normal sinus rhythm  Relevant CV Studies: None  Laboratory Data:  Chemistry Recent Labs  Lab 02/12/22 2009  NA 136  K 2.8*  CL 101  CO2 25  GLUCOSE 139*  BUN 21  CREATININE 1.38*  CALCIUM 9.3  GFRNONAA 35*  ANIONGAP 10    Recent Labs  Lab 02/12/22 2009  PROT 6.6  ALBUMIN 3.8  AST 33  ALT 17  ALKPHOS 97  BILITOT 0.4   Hematology Recent Labs  Lab 02/12/22 2009  WBC 9.8  RBC 5.07  HGB 14.4  HCT 41.3  MCV 81.5  MCH 28.4  MCHC 34.9  RDW 13.0  PLT 295   Cardiac EnzymesNo results for input(s): "TROPONINI" in the last  168 hours. No results for input(s): "TROPIPOC" in the last 168 hours.  BNPNo results for input(s): "BNP", "PROBNP" in the last 168 hours.  DDimer No results for input(s): "DDIMER" in the last 168 hours.  Radiology/Studies:  CT Angio Chest/Abd/Pel for Dissection W and/or Wo Contrast  Result Date:  02/12/2022 CLINICAL DATA:  Chest or back pain with aortic dissection suspected. Substernal chest pain this afternoon radiating to the abdomen. EXAM: CT ANGIOGRAPHY CHEST, ABDOMEN AND PELVIS TECHNIQUE: Non-contrast CT of the chest was initially obtained. Multidetector CT imaging through the chest, abdomen and pelvis was performed using the standard protocol during bolus administration of intravenous contrast. Multiplanar reconstructed images and MIPs were obtained and reviewed to evaluate the vascular anatomy. RADIATION DOSE REDUCTION: This exam was performed according to the departmental dose-optimization program which includes automated exposure control, adjustment of the mA and/or kV according to patient size and/or use of iterative reconstruction technique. CONTRAST:  93m OMNIPAQUE IOHEXOL 350 MG/ML SOLN COMPARISON:  02/05/2021 FINDINGS: CTA CHEST FINDINGS Cardiovascular: Unenhanced images of the chest demonstrate calcification of the aorta and coronary arteries. No evidence of intramural hematoma. Images obtained during arterial phase after intravenous contrast material administration demonstrate normal caliber thoracic aorta. No aortic dissection. Great vessel origins are patent. Normal heart size. No pericardial effusions. Central pulmonary arteries are patent without evidence of significant pulmonary embolus. Mediastinum/Nodes: Thyroid gland is unremarkable. Large esophageal hiatal hernia. Esophagus is decompressed. No significant lymphadenopathy. Lungs/Pleura: Mild atelectasis in the lungs. Focal subpleural opacity in the right upper lung is unchanged since prior study. Tiny pulmonary nodules are unchanged. Musculoskeletal: Degenerative changes.  No acute bony abnormalities. Review of the MIP images confirms the above findings. CTA ABDOMEN AND PELVIS FINDINGS VASCULAR Aorta: Diffuse aortic calcification. Normal caliber abdominal aorta. No dissection. 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 Prompt reconstitution. SMA: The superior mesenteric artery is small in caliber but appears patent. Focal proximal stenosis with about 40% diameter reduction. Renals: Renal arteries are patent bilaterally. No aneurysm or occlusion. Nephrograms are symmetrical. IMA: Inferior mesenteric artery is patent. Inflow: Patent without evidence of aneurysm, dissection, vasculitis or significant stenosis. Veins: No obvious venous abnormality within the limitations of this arterial phase study. Review of the MIP images confirms the above findings. NON-VASCULAR Hepatobiliary: No focal liver abnormality is seen. No gallstones, gallbladder wall thickening, or biliary dilatation. Pancreas: Unremarkable. No pancreatic ductal dilatation or surrounding inflammatory changes. Spleen: Normal in size without focal abnormality. Adrenals/Urinary Tract: Adrenal glands are unremarkable. Kidneys are normal, without renal calculi, focal lesion, or hydronephrosis. Bladder is unremarkable. Stomach/Bowel: Stomach, small bowel, and colon are not abnormally distended. Partial right hemicolectomy with ileocolonic anastomosis. No wall thickening or inflammatory changes. Lymphatic: Nonspecific lymphadenopathy in the mesentery with mild mesenteric stranding. Largest lymph nodes measure up to about 8 mm short axis dimension. Appearance is nonspecific. Changes are more prominent than on the prior study. Possibly inflammatory or reactive change but early metastatic disease could potentially have this appearance. Reproductive: Status post hysterectomy. No adnexal masses. Other: No abdominal wall hernia or abnormality. No abdominopelvic ascites. Musculoskeletal: Degenerative changes. No acute bony abnormalities. Postoperative change in the left hip. Review of the MIP images confirms the above findings. IMPRESSION: 1. Aortic atherosclerosis. No evidence of aortic aneurysm or dissection. 2. No evidence of  metastatic disease in the chest. 3. Postoperative right hemicolectomy with patent ileocolonic anastomosis. 4. Borderline prominence of mesenteric lymph nodes with mesenteric stranding, increased since prior study. Appearances  are nonspecific. 5. Large esophageal hiatal hernia. 6. 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 Electronically Signed   By: Lucienne Capers M.D.   On: 02/12/2022 22:45   DG Chest Port 1 View  Result Date: 02/12/2022 CLINICAL DATA:  Chest pain EXAM: PORTABLE CHEST 1 VIEW COMPARISON:  07/24/2020 FINDINGS: Lungs are clear.  No pleural effusion or pneumothorax. The heart is normal in size.  Thoracic aortic atherosclerosis. Large hiatal hernia. IMPRESSION: No evidence of acute cardiopulmonary disease. Electronically Signed   By: Julian Hy M.D.   On: 02/12/2022 21:00    Assessment and Plan:   # NSTEMI Given her acute onset chest pain and rising troponin (now 17->90->124), she warrants coronary evaluation. Agree with heparin and aspirin. Echo tomorrow.  - npo for lhc tomorrow - please order echo - continue asa 81 mg - start atorvastatin 80 mg, please check lipid panel - recommend starting low dose BB, metop tartrate 12.5 mg q12h - sublingual nitro and/or morphine for recurrent pain      For questions or updates, please contact [facility] Please consult [email or web address] for contact info under     Signed, Doyne Keel, Park  02/12/2022 11:36 PM

## 2022-02-12 NOTE — ED Notes (Signed)
ED Provider at bedside. 

## 2022-02-12 NOTE — Assessment & Plan Note (Signed)
Replace and follow check magnesium level

## 2022-02-12 NOTE — Assessment & Plan Note (Signed)
Continue Norvasc 10 mg p.o. daily

## 2022-02-12 NOTE — Assessment & Plan Note (Signed)
Continue Protonix increased to twice daily dosing 40 mg

## 2022-02-13 ENCOUNTER — Encounter (HOSPITAL_COMMUNITY)
Admission: EM | Disposition: A | Discharge status: Home / Self Care | Payer: Self-pay | Source: Home / Self Care | Attending: Emergency Medicine

## 2022-02-13 ENCOUNTER — Observation Stay (HOSPITAL_COMMUNITY): Payer: Medicare Other

## 2022-02-13 ENCOUNTER — Observation Stay (HOSPITAL_BASED_OUTPATIENT_CLINIC_OR_DEPARTMENT_OTHER): Payer: Medicare Other

## 2022-02-13 DIAGNOSIS — I10 Essential (primary) hypertension: Secondary | ICD-10-CM | POA: Diagnosis not present

## 2022-02-13 DIAGNOSIS — K8020 Calculus of gallbladder without cholecystitis without obstruction: Secondary | ICD-10-CM | POA: Diagnosis not present

## 2022-02-13 DIAGNOSIS — K3189 Other diseases of stomach and duodenum: Secondary | ICD-10-CM | POA: Diagnosis not present

## 2022-02-13 DIAGNOSIS — E7800 Pure hypercholesterolemia, unspecified: Secondary | ICD-10-CM

## 2022-02-13 DIAGNOSIS — E876 Hypokalemia: Secondary | ICD-10-CM | POA: Diagnosis not present

## 2022-02-13 DIAGNOSIS — C182 Malignant neoplasm of ascending colon: Secondary | ICD-10-CM | POA: Diagnosis not present

## 2022-02-13 DIAGNOSIS — I214 Non-ST elevation (NSTEMI) myocardial infarction: Secondary | ICD-10-CM | POA: Diagnosis not present

## 2022-02-13 DIAGNOSIS — I2510 Atherosclerotic heart disease of native coronary artery without angina pectoris: Secondary | ICD-10-CM | POA: Diagnosis not present

## 2022-02-13 DIAGNOSIS — K449 Diaphragmatic hernia without obstruction or gangrene: Secondary | ICD-10-CM | POA: Diagnosis not present

## 2022-02-13 DIAGNOSIS — R778 Other specified abnormalities of plasma proteins: Secondary | ICD-10-CM | POA: Diagnosis not present

## 2022-02-13 DIAGNOSIS — K2950 Unspecified chronic gastritis without bleeding: Secondary | ICD-10-CM | POA: Diagnosis not present

## 2022-02-13 DIAGNOSIS — R1013 Epigastric pain: Secondary | ICD-10-CM | POA: Diagnosis not present

## 2022-02-13 HISTORY — PX: LEFT HEART CATH AND CORONARY ANGIOGRAPHY: CATH118249

## 2022-02-13 LAB — CBC
HCT: 43.8 % (ref 36.0–46.0)
HCT: 42.8 % (ref 36.0–46.0)
Hemoglobin: 15.3 g/dL — ABNORMAL HIGH (ref 12.0–15.0)
Hemoglobin: 15.2 g/dL — ABNORMAL HIGH (ref 12.0–15.0)
MCH: 28.8 pg (ref 26.0–34.0)
MCH: 28.9 pg (ref 26.0–34.0)
MCHC: 34.9 g/dL (ref 30.0–36.0)
MCHC: 35.5 g/dL (ref 30.0–36.0)
MCV: 82.5 fL (ref 80.0–100.0)
MCV: 81.4 fL (ref 80.0–100.0)
Platelets: 294 10*3/uL (ref 150–400)
Platelets: 281 10*3/uL (ref 150–400)
RBC: 5.31 MIL/uL — ABNORMAL HIGH (ref 3.87–5.11)
RBC: 5.26 MIL/uL — ABNORMAL HIGH (ref 3.87–5.11)
RDW: 13.2 % (ref 11.5–15.5)
RDW: 13.2 % (ref 11.5–15.5)
WBC: 12.8 10*3/uL — ABNORMAL HIGH (ref 4.0–10.5)
WBC: 9.5 10*3/uL (ref 4.0–10.5)
nRBC: 0 % (ref 0.0–0.2)
nRBC: 0 % (ref 0.0–0.2)

## 2022-02-13 LAB — MAGNESIUM
Magnesium: 1.5 mg/dL — ABNORMAL LOW (ref 1.7–2.4)
Magnesium: 2.2 mg/dL (ref 1.7–2.4)

## 2022-02-13 LAB — URINALYSIS, COMPLETE (UACMP) WITH MICROSCOPIC
Bacteria, UA: NONE SEEN
Bilirubin Urine: NEGATIVE
Glucose, UA: NEGATIVE mg/dL
Ketones, ur: NEGATIVE mg/dL
Leukocytes,Ua: NEGATIVE
Nitrite: NEGATIVE
Protein, ur: NEGATIVE mg/dL
Specific Gravity, Urine: 1.04 — ABNORMAL HIGH (ref 1.005–1.030)
pH: 5 (ref 5.0–8.0)

## 2022-02-13 LAB — OSMOLALITY: Osmolality: 297 mOsm/kg — ABNORMAL HIGH (ref 275–295)

## 2022-02-13 LAB — ECHOCARDIOGRAM COMPLETE
AR max vel: 1.87 cm2
AV Peak grad: 9.7 mmHg
Ao pk vel: 1.56 m/s
Area-P 1/2: 8.25 cm2
Height: 61 in
S' Lateral: 2.7 cm
Weight: 2342.4 oz

## 2022-02-13 LAB — COMPREHENSIVE METABOLIC PANEL
ALT: 110 U/L — ABNORMAL HIGH (ref 0–44)
AST: 183 U/L — ABNORMAL HIGH (ref 15–41)
Albumin: 3.7 g/dL (ref 3.5–5.0)
Alkaline Phosphatase: 102 U/L (ref 38–126)
Anion gap: 10 (ref 5–15)
BUN: 15 mg/dL (ref 8–23)
CO2: 27 mmol/L (ref 22–32)
Calcium: 9 mg/dL (ref 8.9–10.3)
Chloride: 100 mmol/L (ref 98–111)
Creatinine, Ser: 1.19 mg/dL — ABNORMAL HIGH (ref 0.44–1.00)
GFR, Estimated: 42 mL/min — ABNORMAL LOW (ref >60)
Glucose, Bld: 111 mg/dL — ABNORMAL HIGH (ref 70–99)
Potassium: 3 mmol/L — ABNORMAL LOW (ref 3.5–5.1)
Sodium: 137 mmol/L (ref 135–145)
Total Bilirubin: 1.2 mg/dL (ref 0.3–1.2)
Total Protein: 6.7 g/dL (ref 6.5–8.1)

## 2022-02-13 LAB — PREALBUMIN: Prealbumin: 19 mg/dL (ref 18–38)

## 2022-02-13 LAB — CK: Total CK: 95 U/L (ref 38–234)

## 2022-02-13 LAB — AMYLASE: Amylase: 289 U/L — ABNORMAL HIGH (ref 28–100)

## 2022-02-13 LAB — PHOSPHORUS
Phosphorus: 3.1 mg/dL (ref 2.5–4.6)
Phosphorus: 3.7 mg/dL (ref 2.5–4.6)

## 2022-02-13 LAB — LIPID PANEL
Cholesterol: 251 mg/dL — ABNORMAL HIGH (ref 0–200)
HDL: 68 mg/dL (ref >40)
LDL Cholesterol: 160 mg/dL — ABNORMAL HIGH (ref 0–99)
Total CHOL/HDL Ratio: 3.7 RATIO
Triglycerides: 113 mg/dL (ref <150)
VLDL: 23 mg/dL (ref 0–40)

## 2022-02-13 LAB — TSH: TSH: 6.544 u[IU]/mL — ABNORMAL HIGH (ref 0.350–4.500)

## 2022-02-13 LAB — HEPARIN LEVEL (UNFRACTIONATED)
Heparin Unfractionated: 0.49 IU/mL (ref 0.30–0.70)
Heparin Unfractionated: 0.25 IU/mL — ABNORMAL LOW (ref 0.30–0.70)
Heparin Unfractionated: 0.24 IU/mL — ABNORMAL LOW (ref 0.30–0.70)

## 2022-02-13 LAB — TROPONIN I (HIGH SENSITIVITY)
Troponin I (High Sensitivity): 123 ng/L (ref <18)
Troponin I (High Sensitivity): 158 ng/L (ref <18)

## 2022-02-13 LAB — LIPASE, BLOOD: Lipase: 667 U/L — ABNORMAL HIGH (ref 11–51)

## 2022-02-13 SURGERY — LEFT HEART CATH AND CORONARY ANGIOGRAPHY
Anesthesia: LOCAL

## 2022-02-13 MED ORDER — LATANOPROST 0.005 % OP SOLN
1.0000 [drp] | Freq: Every day | OPHTHALMIC | Status: DC
  Administered 2022-02-13 – 2022-02-16 (×4): 1 [drp] via OPHTHALMIC
  Filled 2022-02-13: qty 2.5

## 2022-02-13 MED ORDER — POTASSIUM CHLORIDE 10 MEQ/100ML IV SOLN
10.0000 meq | INTRAVENOUS | Status: AC
  Administered 2022-02-13 (×4): 10 meq via INTRAVENOUS
  Filled 2022-02-13 (×4): qty 100

## 2022-02-13 MED ORDER — ASPIRIN 81 MG PO TBEC
81.0000 mg | DELAYED_RELEASE_TABLET | Freq: Every day | ORAL | Status: DC
  Administered 2022-02-13 – 2022-02-17 (×5): 81 mg via ORAL
  Filled 2022-02-13 (×5): qty 1

## 2022-02-13 MED ORDER — POTASSIUM CHLORIDE 10 MEQ/100ML IV SOLN
10.0000 meq | INTRAVENOUS | Status: AC
  Administered 2022-02-13 (×2): 10 meq via INTRAVENOUS
  Filled 2022-02-13 (×4): qty 100

## 2022-02-13 MED ORDER — TIZANIDINE HCL 4 MG PO TABS
4.0000 mg | ORAL_TABLET | Freq: Every day | ORAL | Status: DC
  Administered 2022-02-13 – 2022-02-16 (×4): 4 mg via ORAL
  Filled 2022-02-13 (×5): qty 1

## 2022-02-13 MED ORDER — SODIUM CHLORIDE 0.9 % IV SOLN: INTRAVENOUS | Status: DC

## 2022-02-13 MED ORDER — AMLODIPINE BESYLATE 10 MG PO TABS
10.0000 mg | ORAL_TABLET | Freq: Every day | ORAL | Status: DC
  Administered 2022-02-13 – 2022-02-17 (×5): 10 mg via ORAL
  Filled 2022-02-13 (×5): qty 1

## 2022-02-13 MED ORDER — SODIUM CHLORIDE 0.9 % IV SOLN: INTRAVENOUS | Status: AC

## 2022-02-13 MED ORDER — HEPARIN SODIUM (PORCINE) 1000 UNIT/ML IJ SOLN
INTRAMUSCULAR | Status: DC | PRN
  Administered 2022-02-13: 3500 [IU] via INTRAVENOUS

## 2022-02-13 MED ORDER — ASPIRIN 81 MG PO CHEW: 81.0000 mg | CHEWABLE_TABLET | ORAL | Status: DC

## 2022-02-13 MED ORDER — SODIUM CHLORIDE 0.9% FLUSH
3.0000 mL | Freq: Two times a day (BID) | INTRAVENOUS | Status: DC
  Administered 2022-02-13 – 2022-02-16 (×4): 3 mL via INTRAVENOUS

## 2022-02-13 MED ORDER — SODIUM CHLORIDE 0.9 % IV SOLN
250.0000 mL | INTRAVENOUS | Status: DC | PRN
  Administered 2022-02-13 – 2022-02-14 (×3): 250 mL via INTRAVENOUS

## 2022-02-13 MED ORDER — SODIUM CHLORIDE 0.9% FLUSH: 3.0000 mL | INTRAVENOUS | Status: DC | PRN

## 2022-02-13 MED ORDER — IOHEXOL 350 MG/ML SOLN
INTRAVENOUS | Status: DC | PRN
  Administered 2022-02-13: 50 mL

## 2022-02-13 MED ORDER — VERAPAMIL HCL 2.5 MG/ML IV SOLN
INTRAVENOUS | Status: DC | PRN
  Administered 2022-02-13: 10 mL via INTRA_ARTERIAL

## 2022-02-13 MED ORDER — SODIUM CHLORIDE 0.9% FLUSH
3.0000 mL | Freq: Two times a day (BID) | INTRAVENOUS | Status: DC
Start: 2022-02-13 — End: 2022-02-17
  Administered 2022-02-13 – 2022-02-17 (×5): 3 mL via INTRAVENOUS

## 2022-02-13 MED ORDER — HYDRALAZINE HCL 20 MG/ML IJ SOLN: 10.0000 mg | INTRAMUSCULAR | Status: AC | PRN

## 2022-02-13 MED ORDER — SODIUM CHLORIDE 0.9 % IV SOLN: 250.0000 mL | INTRAVENOUS | Status: DC | PRN

## 2022-02-13 MED ORDER — SODIUM CHLORIDE 0.9% FLUSH
3.0000 mL | Freq: Two times a day (BID) | INTRAVENOUS | Status: DC
Start: 2022-02-13 — End: 2022-02-17
  Administered 2022-02-13 – 2022-02-17 (×8): 3 mL via INTRAVENOUS

## 2022-02-13 MED ORDER — PANTOPRAZOLE SODIUM 40 MG PO TBEC
40.0000 mg | DELAYED_RELEASE_TABLET | Freq: Two times a day (BID) | ORAL | Status: DC
  Administered 2022-02-13 – 2022-02-17 (×9): 40 mg via ORAL
  Filled 2022-02-13 (×9): qty 1

## 2022-02-13 MED ORDER — MORPHINE SULFATE (PF) 2 MG/ML IV SOLN: 2.0000 mg | INTRAVENOUS | Status: DC | PRN

## 2022-02-13 MED ORDER — HEPARIN (PORCINE) IN NACL 1000-0.9 UT/500ML-% IV SOLN
INTRAVENOUS | Status: DC | PRN
  Administered 2022-02-13 (×2): 500 mL

## 2022-02-13 MED ORDER — LIDOCAINE HCL (PF) 1 % IJ SOLN
INTRAMUSCULAR | Status: DC | PRN
  Administered 2022-02-13: 2 mL

## 2022-02-13 MED ORDER — METOPROLOL TARTRATE 25 MG PO TABS
25.0000 mg | ORAL_TABLET | Freq: Two times a day (BID) | ORAL | Status: DC
  Administered 2022-02-13 – 2022-02-17 (×9): 25 mg via ORAL
  Filled 2022-02-13 (×9): qty 1

## 2022-02-13 MED ORDER — ACETAMINOPHEN 650 MG RE SUPP: 650.0000 mg | Freq: Four times a day (QID) | RECTAL | Status: DC | PRN

## 2022-02-13 MED ORDER — POTASSIUM CHLORIDE 10 MEQ/100ML IV SOLN
10.0000 meq | INTRAVENOUS | Status: AC
  Administered 2022-02-13 – 2022-02-14 (×2): 10 meq via INTRAVENOUS

## 2022-02-13 MED ORDER — LABETALOL HCL 5 MG/ML IV SOLN: 10.0000 mg | INTRAVENOUS | Status: AC | PRN

## 2022-02-13 MED ORDER — HYDROCODONE-ACETAMINOPHEN 5-325 MG PO TABS: 1.0000 | ORAL_TABLET | ORAL | Status: DC | PRN

## 2022-02-13 MED ORDER — ACETAMINOPHEN 325 MG PO TABS: 650.0000 mg | ORAL_TABLET | Freq: Four times a day (QID) | ORAL | Status: DC | PRN

## 2022-02-13 MED ORDER — HEPARIN SODIUM (PORCINE) 5000 UNIT/ML IJ SOLN
5000.0000 [IU] | Freq: Three times a day (TID) | INTRAMUSCULAR | Status: DC
  Administered 2022-02-13 – 2022-02-16 (×7): 5000 [IU] via SUBCUTANEOUS
  Filled 2022-02-13 (×7): qty 1

## 2022-02-13 MED ORDER — ONDANSETRON HCL 4 MG/2ML IJ SOLN
4.0000 mg | Freq: Four times a day (QID) | INTRAMUSCULAR | Status: DC | PRN
  Administered 2022-02-16: 4 mg via INTRAVENOUS
  Filled 2022-02-13: qty 2

## 2022-02-13 MED ORDER — ACETAMINOPHEN 325 MG PO TABS: 650.0000 mg | ORAL_TABLET | ORAL | Status: DC | PRN

## 2022-02-13 SURGICAL SUPPLY — 13 items
BAND CMPR LRG ZPHR (HEMOSTASIS) ×1
BAND ZEPHYR COMPRESS 30 LONG (HEMOSTASIS) ×1 IMPLANT
CATH OPTITORQUE TIG 4.0 5F (CATHETERS) ×1 IMPLANT
ELECT DEFIB PAD ADLT CADENCE (PAD) ×1 IMPLANT
GLIDESHEATH SLEND SS 6F .021 (SHEATH) ×1 IMPLANT
GUIDEWIRE INQWIRE 1.5J.035X260 (WIRE) IMPLANT
INQWIRE 1.5J .035X260CM (WIRE) ×2
KIT HEART LEFT (KITS) ×3 IMPLANT
PACK CARDIAC CATHETERIZATION (CUSTOM PROCEDURE TRAY) ×3 IMPLANT
SHEATH PROBE COVER 6X72 (BAG) ×1 IMPLANT
SYR MEDRAD MARK 7 150ML (SYRINGE) ×3 IMPLANT
TRANSDUCER W/STOPCOCK (MISCELLANEOUS) ×3 IMPLANT
TUBING CIL FLEX 10 FLL-RA (TUBING) ×3 IMPLANT

## 2022-02-13 NOTE — Progress Notes (Signed)
PT Cancellation Note  Patient Details Name: Amber Park MRN: [id number] DOB: 09/24/1924   Cancelled Treatment:    Reason Eval/Treat Not Completed: Medical issues which prohibited therapy - pt awaiting heart cath, cardiac NP requesting PT hold until after cath. PT to check back after.  Stacie Glaze, PT DPT Acute Rehabilitation Services Pager [phone]  Office [phone]    Louis Matte 02/13/2022, 10:25 AM

## 2022-02-13 NOTE — Progress Notes (Signed)
Cardiac Cath findings reviewed:  Impression: Angiographically trivial CAD no more than 20% lesions in major vessels.  Distal tapering vessels fade out near the apex but no culprit lesion. Hemodynamics: LVEDP 18 mmHg.   Recommendations: Consider noncardiac etiology for chest pain, although cannot exclude microvascular disease/spasm, but no catheterization evidence to suggest that Zephyr band removal per protocol. We will DC IV heparin Stable from a cardiac cath standpoint for discharge in the morning  Amylase, Lipase and LFTs elevated >> consider GI etiology of epigastric pain and N/V.  No other cardiac recs at this time.    [facility] will sign off.   Medication Recommendations:  Amlodipine '10mg'$  daily, ASA '81mg'$  daily, HCTZ '25mg'$  daily.  No statin due to elevated LFTs>>could consider as outtpt Other recommendations (labs, testing, etc):  none Follow up as an outpatient:  Followup outpt with PCP.

## 2022-02-13 NOTE — Progress Notes (Signed)
PROGRESS NOTE    Amber Park  KXF:[id number] DOB: [birth year] DOA: 02/12/2022 PCP: Lajean Manes, MD    Brief Narrative:   Amber Park is a 88 y.o. female with past medical history significant for GERD, colon cancer status post resection in remission, HTN, glaucoma, history of AVMs, history of iron deficiency anemia in the past, hiatal hernia hospital with chest pain with radiation to the abdomen and back.  EMS was called and patient received aspirin 324 mg and 2 sublingual nitro with improvement in her chest pain.  She stated chest pain back pain before dinner but with food made it worse.  Had a stress test done 20 years back.  Patient was then considered for admission to the hospital for further work-up and treatment.  Assessment and Plan:  Principal Problem:   NSTEMI (non-ST elevated myocardial infarction) ([place]) Active Problems:   GERD   Hiatal hernia   Colon cancer, ascending (HCC)   Hypokalemia   HTN (hypertension)   Hypomagnesemia   Chest pain/non-ST elevation MI. Troponin was elevated up to 90.  EKG showed left bundle branch block unchanged prior to before.  Cardiology was consulted and currently on heparin drip, metoprolol, aspirin.  Holding on statin due to elevated LFT.  Lipid panel showing total cholesterol of 251 with LDL of 160.  2D echocardiogram showed regional wall motion abnormality.  At this time cardiology has plans for cardiac catheterization.  Epigastric pain, elevated LFTs, elevated lipase.  CT scan did not show any acute findings on the pancreas.  GI on board.  Will follow recommendations.   Colon cancer, ascending Patient has been followed by Dr Learta Codding as outpatient.   GERD Continue Protonix twice a day   Hiatal hernia Continue PPI for reflux prevention.   HTN (hypertension) Continue Norvasc.  Hold HCTZ at this time.  Has been started on metoprolol as well.   Hypokalemia Potassium level of 3.0 today.  Has been replenished with IV 40 mEq.  We  will replenish orally as well.  Check BMP in AM.   Hypomagnesemia Improved after replacement.  Check magnesium level in a.m.      DVT prophylaxis:   Heparin drip.   Code Status:     Code Status: Full Code  Disposition: Likely home, PT pending  Status is: Observation  The patient will require care spanning > 2 midnights and should be moved to inpatient because: Chest pain, need for cardiac catheterization, GI evaluation   Family Communication: Not available at bedside.  Consultants:  Cardiology GI  Procedures:  None yet  Antimicrobials:  None  Anti-infectives (From admission, onward)    None      Subjective: Today, patient was seen and examined at bedside.  Patient complains of mild epigastric/lower chest pain.  Denies any dyspnea, diaphoresis, nausea or vomiting.   Objective: Vitals:   02/13/22 0524 02/13/22 0714 02/13/22 1052 02/13/22 1100  BP: (!) 153/70 (!) 154/65  (!) 150/46  Pulse: 99 81  80  Resp: '16 20  18  '$ Temp: (!) 97.4 F (36.3 C) (!) 97.5 F (36.4 C)  (!) 97.5 F (36.4 C)  TempSrc: Oral Oral  Oral  SpO2: 95% 98%  95%  Weight:   66.4 kg   Height:        Intake/Output Summary (Last 24 hours) at 02/13/2022 1451 Last data filed at 02/13/2022 0600 Gross per 24 hour  Intake 278.96 ml  Output --  Net 278.96 ml   Filed Weights   02/12/22 2328  02/13/22 0427 02/13/22 1052  Weight: 67.1 kg 66.4 kg 66.4 kg    Physical Examination: Body mass index is 27.66 kg/m.  General:  Average built, not in obvious distress HENT:   No scleral pallor or icterus noted. Oral mucosa is moist.  Chest:  Clear breath sounds.  Diminished breath sounds bilaterally. No crackles or wheezes.  CVS: S1 &S2 heard. No murmur.  Regular rate and rhythm. Abdomen: Soft, epigastric tenderness, nondistended.  Bowel sounds are heard.   Extremities: No cyanosis, clubbing or edema.  Peripheral pulses are palpable. Psych: Alert, awake and oriented, normal mood CNS:  No cranial  nerve deficits.  Power equal in all extremities.   Skin: Warm and dry.  No rashes noted.  Data Reviewed:   CBC: Recent Labs  Lab 02/12/22 2009 02/13/22 0300 02/13/22 0510  WBC 9.8 12.8* 9.5  HGB 14.4 15.3* 15.2*  HCT 41.3 43.8 42.8  MCV 81.5 82.5 81.4  PLT 295 294 419    Basic Metabolic Panel: Recent Labs  Lab 02/12/22 2009 02/12/22 2211 02/12/22 2333 02/13/22 0510  NA 136  --   --  137  K 2.8*  --   --  3.0*  CL 101  --   --  100  CO2 25  --   --  27  GLUCOSE 139*  --   --  111*  BUN 21  --   --  15  CREATININE 1.38*  --   --  1.19*  CALCIUM 9.3  --   --  9.0  MG  --  1.5* 1.5* 2.2  PHOS  --   --  3.7 3.1    Liver Function Tests: Recent Labs  Lab 02/12/22 2009 02/13/22 0510  AST 33 183*  ALT 17 110*  ALKPHOS 97 102  BILITOT 0.4 1.2  PROT 6.6 6.7  ALBUMIN 3.8 3.7     Radiology Studies: ECHOCARDIOGRAM COMPLETE  Result Date: 02/13/2022    ECHOCARDIOGRAM REPORT   Patient Name:   Amber Park Date of Exam: 02/13/2022 Medical Rec #:  [id number]      Height:       61.0 in Accession #:    [id number]     Weight:       146.4 lb Date of Birth:  11-18-1924      BSA:          1.654 m Patient Age:    46 years       BP:           154/65 mmHg Patient Gender: F              HR:           81 bpm. Exam Location:  Inpatient Procedure: 2D Echo, Cardiac Doppler and Color Doppler Indications:    Elevated troponin  History:        Patient has no prior history of Echocardiogram examinations.                 Risk Factors:Hypertension.  Sonographer:    Jefferey Pica Referring Phys: [place]  1. Left ventricular ejection fraction, by estimation, is 55 to 60%. The left ventricle has normal function. The left ventricle has no regional wall motion abnormalities. There is mild left ventricular hypertrophy. Left ventricular diastolic parameters are indeterminate.  2. Right ventricular systolic function is normal. The right ventricular size is normal. There is  normal pulmonary artery systolic pressure. The estimated right ventricular systolic pressure is 34.1  mmHg.  3. The mitral valve is normal in structure. Trivial mitral valve regurgitation. No evidence of mitral stenosis.  4. The aortic valve is tricuspid. There is mild calcification of the aortic valve. Aortic valve regurgitation is trivial. No aortic stenosis is present.  5. The inferior vena cava is normal in size with greater than 50% respiratory variability, suggesting right atrial pressure of 3 mmHg. FINDINGS  Left Ventricle: Left ventricular ejection fraction, by estimation, is 55 to 60%. The left ventricle has normal function. The left ventricle has no regional wall motion abnormalities. The left ventricular internal cavity size was normal in size. There is  mild left ventricular hypertrophy. Left ventricular diastolic parameters are indeterminate. Right Ventricle: The right ventricular size is normal. No increase in right ventricular wall thickness. Right ventricular systolic function is normal. There is normal pulmonary artery systolic pressure. The tricuspid regurgitant velocity is 2.79 m/s, and  with an assumed right atrial pressure of 3 mmHg, the estimated right ventricular systolic pressure is 50.9 mmHg. Left Atrium: Left atrial size was normal in size. Right Atrium: Right atrial size was normal in size. Pericardium: There is no evidence of pericardial effusion. Mitral Valve: The mitral valve is normal in structure. Trivial mitral valve regurgitation. No evidence of mitral valve stenosis. Tricuspid Valve: The tricuspid valve is normal in structure. Tricuspid valve regurgitation is mild . No evidence of tricuspid stenosis. Aortic Valve: The aortic valve is tricuspid. There is mild calcification of the aortic valve. Aortic valve regurgitation is trivial. No aortic stenosis is present. Aortic valve peak gradient measures 9.7 mmHg. Pulmonic Valve: The pulmonic valve was normal in structure. Pulmonic valve  regurgitation is not visualized. No evidence of pulmonic stenosis. Aorta: The aortic root is normal in size and structure. Venous: The inferior vena cava is normal in size with greater than 50% respiratory variability, suggesting right atrial pressure of 3 mmHg. IAS/Shunts: No atrial level shunt detected by color flow Doppler.  LEFT VENTRICLE PLAX 2D LVIDd:         3.50 cm   Diastology LVIDs:         2.70 cm   LV e' medial:    7.51 cm/s LV PW:         1.15 cm   LV E/e' medial:  12.2 LV IVS:        1.15 cm   LV e' lateral:   6.76 cm/s LVOT diam:     2.00 cm   LV E/e' lateral: 13.6 LV SV:         61 LV SV Index:   37 LVOT Area:     3.14 cm  RIGHT VENTRICLE             IVC RV S prime:     14.20 cm/s  IVC diam: 1.20 cm TAPSE (M-mode): 2.2 cm LEFT ATRIUM             Index        RIGHT ATRIUM           Index LA diam:        3.25 cm 1.96 cm/m   RA Area:     13.70 cm LA Vol (A2C):   28.7 ml 17.35 ml/m  RA Volume:   32.60 ml  19.71 ml/m LA Vol (A4C):   33.7 ml 20.37 ml/m LA Biplane Vol: 31.2 ml 18.86 ml/m  AORTIC VALVE                 PULMONIC VALVE  AV Area (Vmax): 1.87 cm     PV Vmax:       1.11 m/s AV Vmax:        156.00 cm/s  PV Peak grad:  4.9 mmHg AV Peak Grad:   9.7 mmHg LVOT Vmax:      93.10 cm/s LVOT Vmean:     55.700 cm/s LVOT VTI:       0.195 m  AORTA Ao Root diam: 2.95 cm Ao Asc diam:  3.30 cm MITRAL VALVE                TRICUSPID VALVE MV Area (PHT): 8.25 cm     TR Peak grad:   31.1 mmHg MV Decel Time: 92 msec      TR Vmax:        279.00 cm/s MV E velocity: 91.80 cm/s MV A velocity: 146.00 cm/s  SHUNTS MV E/A ratio:  0.63         Systemic VTI:  0.20 m                             Systemic Diam: 2.00 cm Cherlynn Kaiser MD Electronically signed by Cherlynn Kaiser MD Signature Date/Time: 02/13/2022/12:49:36 PM    Final    US ABDOMEN LIMITED RUQ (LIVER/GB)  Result Date: 02/13/2022 CLINICAL DATA:  88 year old female with abnormal lipase. Epigastric pain. EXAM: ULTRASOUND ABDOMEN LIMITED RIGHT UPPER QUADRANT  COMPARISON:  CTA chest abdomen and pelvis 02/12/2022. FINDINGS: Gallbladder: Gallbladder appears mildly distended similar to the CTA yesterday, with normal wall thickness. However, there is some dependent echogenic material suspicious for small gallstones in addition to sludge (image 30), approximately 8 mm. No pericholecystic fluid. No sonographic Murphy sign elicited. Common bile duct: Diameter: 5 mm, normal. Liver: Echogenic liver with probable fatty sparing at the gallbladder fossa (image 66). No other discrete liver lesion. No intrahepatic biliary ductal dilatation. Portal vein is patent on color Doppler imaging with normal direction of blood flow towards the liver. Other: Negative visible right kidney. IMPRESSION: 1. Evidence of sludge and small gallstones, but no strong evidence of acute cholecystitis. 2. No evidence of bile duct obstruction. 3. Hepatic steatosis. Electronically Signed   By: Genevie Ann M.D.   On: 02/13/2022 11:23   CT Angio Chest/Abd/Pel for Dissection W and/or Wo Contrast  Result Date: 02/12/2022 CLINICAL DATA:  Chest or back pain with aortic dissection suspected. Substernal chest pain this afternoon radiating to the abdomen. EXAM: CT ANGIOGRAPHY CHEST, ABDOMEN AND PELVIS TECHNIQUE: Non-contrast CT of the chest was initially obtained. Multidetector CT imaging through the chest, abdomen and pelvis was performed using the standard protocol during bolus administration of intravenous contrast. Multiplanar reconstructed images and MIPs were obtained and reviewed to evaluate the vascular anatomy. RADIATION DOSE REDUCTION: This exam was performed according to the departmental dose-optimization program which includes automated exposure control, adjustment of the mA and/or kV according to patient size and/or use of iterative reconstruction technique. CONTRAST:  64m OMNIPAQUE IOHEXOL 350 MG/ML SOLN COMPARISON:  02/05/2021 FINDINGS: CTA CHEST FINDINGS Cardiovascular: Unenhanced images of the chest  demonstrate calcification of the aorta and coronary arteries. No evidence of intramural hematoma. Images obtained during arterial phase after intravenous contrast material administration demonstrate normal caliber thoracic aorta. No aortic dissection. Great vessel origins are patent. Normal heart size. No pericardial effusions. Central pulmonary arteries are patent without evidence of significant pulmonary embolus. Mediastinum/Nodes: Thyroid gland is unremarkable. Large esophageal hiatal hernia. Esophagus is decompressed. No significant  lymphadenopathy. Lungs/Pleura: Mild atelectasis in the lungs. Focal subpleural opacity in the right upper lung is unchanged since prior study. Tiny pulmonary nodules are unchanged. Musculoskeletal: Degenerative changes.  No acute bony abnormalities. Review of the MIP images confirms the above findings. CTA ABDOMEN AND PELVIS FINDINGS VASCULAR Aorta: Diffuse aortic calcification. Normal caliber abdominal aorta. No dissection. Celiac: Focal calcific stenosis of the origin of the celiac axis, representing about 50% diameter reduction. Prompt reconstitution. SMA: The superior mesenteric artery is small in caliber but appears patent. Focal proximal stenosis with about 40% diameter reduction. Renals: Renal arteries are patent bilaterally. No aneurysm or occlusion. Nephrograms are symmetrical. IMA: Inferior mesenteric artery is patent. Inflow: Patent without evidence of aneurysm, dissection, vasculitis or significant stenosis. Veins: No obvious venous abnormality within the limitations of this arterial phase study. Review of the MIP images confirms the above findings. NON-VASCULAR Hepatobiliary: No focal liver abnormality is seen. No gallstones, gallbladder wall thickening, or biliary dilatation. Pancreas: Unremarkable. No pancreatic ductal dilatation or surrounding inflammatory changes. Spleen: Normal in size without focal abnormality. Adrenals/Urinary Tract: Adrenal glands are  unremarkable. Kidneys are normal, without renal calculi, focal lesion, or hydronephrosis. Bladder is unremarkable. Stomach/Bowel: Stomach, small bowel, and colon are not abnormally distended. Partial right hemicolectomy with ileocolonic anastomosis. No wall thickening or inflammatory changes. Lymphatic: Nonspecific lymphadenopathy in the mesentery with mild mesenteric stranding. Largest lymph nodes measure up to about 8 mm short axis dimension. Appearance is nonspecific. Changes are more prominent than on the prior study. Possibly inflammatory or reactive change but early metastatic disease could potentially have this appearance. Reproductive: Status post hysterectomy. No adnexal masses. Other: No abdominal wall hernia or abnormality. No abdominopelvic ascites. Musculoskeletal: Degenerative changes. No acute bony abnormalities. Postoperative change in the left hip. Review of the MIP images confirms the above findings. IMPRESSION: 1. Aortic atherosclerosis. No evidence of aortic aneurysm or dissection. 2. No evidence of metastatic disease in the chest. 3. Postoperative right hemicolectomy with patent ileocolonic anastomosis. 4. Borderline prominence of mesenteric lymph nodes with mesenteric stranding, increased since prior study. Appearances are nonspecific. 5. Large esophageal hiatal hernia. 6. 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 Electronically Signed   By: Lucienne Capers M.D.   On: 02/12/2022 22:45   DG Chest Port 1 View  Result Date: 02/12/2022 CLINICAL DATA:  Chest pain EXAM: PORTABLE CHEST 1 VIEW COMPARISON:  07/24/2020 FINDINGS: Lungs are clear.  No pleural effusion or pneumothorax. The heart is normal in size.  Thoracic aortic atherosclerosis. Large hiatal hernia. IMPRESSION: No evidence of acute cardiopulmonary disease. Electronically Signed   By: Julian Hy M.D.   On: 02/12/2022 21:00      LOS: 0 days    Flora Lipps,  MD [facility] Available via Epic secure chat 7am-7pm After these hours, please refer to coverage provider listed on [email or web address] 02/13/2022, 2:51 PM

## 2022-02-13 NOTE — Consult Note (Cosign Needed)
[facility] Consult: 9:26 AM 02/13/2022  LOS: 0 days    Referring Provider: Louanne Belton MD  Primary Care Physician:  Lajean Manes, MD Primary Gastroenterologist:  Dr. Rush Landmark.  Patient inadvertently missed her appointment on 7/21 with Dr. Jerilynn Mages.   Oncologist: Dr. Ammie Dalton    Reason for Consultation: GI source of chest pain?   HPI: Amber Park is a 88 y.o. female.  PMH arthritis.  Pneumonia.  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  Colon polyps.  Colon cancer.  Anemia has been treated with IV iron infusions every 8 weeks, adverse reaction to ferrous gluconate in 2022 but subsequently tolerated Feraheme.Marland Kitchen  Has received PRBCs in past.    12/2019 colonoscopy: Nonbleeding hemorrhoids.  Tumor (adenocarcinoma) in proximal ascending colon/cecum.  Polyp (TA wo HGD) in transverse colon.  Diverticulosis in rectosigmoid, sigmoid, descending colon.  12/2019 SBE.  Large HH.  Erosive gastropathy.  Otherwise unremarkable study to proximal jejunum.  Distal extent tattooed.   Path: reactive gastropathy, no HP.   01/2020 laparoscopic right hemicolectomy with ileocolonic anastomosis, Dr. Leighton Ruff. Presented to ED yesterday evening from independent living at friend's home.  Complained of several hours of chest pain, radiating to abdomen, back.  Nonbloody emesis.  Treated with nitro, high-dose aspirin.  Ruled in for NSTEMI, trop mas 158, ? Demand ischemia.   Never needed chemo.   Oncology office visit 10 days ago, she was 2 years out from diagnosis with ongoing clinical remission.  Dr. Ammie Dalton planned 70-monthfollow-up.  Had no plans for surveillance imaging or colonoscopy.  4 PM yesterday onset of pain in epigastrium, bilateral upper abdomen.  Radiated into the back and eventually up  into the chest.  Around 5 PM she went to the dining room but ate very little as she felt nauseous and returned to her room.  Pain progressed.  Had nonbloody nausea vomiting.  Eventually made her way to ED at 8 PM.  Eventually pain subsided after 2 doses of morphine.  It has not resumed.   No similar pain.  Today she has undergone echocardiogram, ultrasound and is awaiting cardiac cath planned for today.  Patient does not describe any prodrome or similar symptoms prior to yesterday.    T. bili, alk phos normal.  AST/ALT 183/110.  Lipase 667. WBCs normal.  Hb 15.2. CT angio chest/ABD/pelvis: Aortic atherosclerosis without dissection or aneurysm.  No mets in chest.  Changes of right hemicolectomy with patent ileocolonic anastomosis.  Borderline prominent mesenteric lymph nodes with mesenteric stranding increased from previous study.  Large esophageal HH.  Atherosclerosis in SMA, celiac axis suggests moderate stenosis.  No pancreatitis. Abdominal ultrasound: Gallbladder sludge and small stones, no strong evidence of cholecystitis.  Bile ducts not dilated.  CBD 5 mm.  Hepatic steatosis w fatty sparing at GB fossa.  PV Dopplers normal. 2D echo: LVEF 55 to 60%.  RV systolic function normal.  Tricuspid, mildly calcified aortic valve with trivial regurgitation. Cardiac cath:    Maternal history colon cancer, died age 86  Sibling history of bladder cancer, renal  cancer, lung cancer, breast cancer, heart disease.  Father died of a heart attack.  Lives at [facility] [place] in independent living.  Widowed.  Retired Designer, multimedia at [employer].  Not consume alcohol beverages.   Past Medical History:  Diagnosis Date   Anemia    iron deficiency   Anxiety    Arthritis    AVM (arteriovenous malformation)    colon, stomach and bowel ( per progress note)   Cancer (HCC)    hx of skin cancer removed from nose    Family history of adverse reaction to anesthesia    Pt son has PONV   GERD (gastroesophageal  reflux disease)    Glaucoma    Hearing aid worn    B/L   History of blood transfusion 1963   History of hiatal hernia    History of left bundle branch block    old ekg 06-23-11 dr Felipa Eth on chart   HOH (hard of hearing)    Hypertension    Pneumonia    hx of pneumonia x 4    PONV (postoperative nausea and vomiting)    Shortness of breath dyspnea    with exertion    Wears glasses     Past Surgical History:  Procedure Laterality Date   ABDOMINAL HYSTERECTOMY     BACK SURGERY     x 2   BIOPSY  01/03/2020   Procedure: BIOPSY;  Surgeon: Irving Copas., MD;  Location: [facility] ENDOSCOPY;  Service: Gastroenterology;;   BREAST SURGERY     bilateral cyst removal    COLONOSCOPY WITH PROPOFOL N/A 01/03/2020   Procedure: COLONOSCOPY WITH PROPOFOL;  Surgeon: Irving Copas., MD;  Location: [facility] ENDOSCOPY;  Service: Gastroenterology;  Laterality: N/A;   ENTEROSCOPY N/A 09/16/2018   Procedure: ENTEROSCOPY;  Surgeon: Rush Landmark Telford Nab., MD;  Location: [place];  Service: Gastroenterology;  Laterality: N/A;   ENTEROSCOPY N/A 01/03/2020   Procedure: ENTEROSCOPY;  Surgeon: Rush Landmark Telford Nab., MD;  Location: [place];  Service: Gastroenterology;  Laterality: N/A;   HOT HEMOSTASIS N/A 09/16/2018   Procedure: HOT HEMOSTASIS (ARGON PLASMA COAGULATION/BICAP);  Surgeon: Irving Copas., MD;  Location: [place];  Service: Gastroenterology;  Laterality: N/A;   LAPAROSCOPIC PARTIAL COLECTOMY N/A 02/10/2020   Procedure: LAPAROSCOPIC PARTIAL COLECTOMY;  Surgeon: Leighton Ruff, MD;  Location: [facility];  Service: General;  Laterality: N/A;   POLYPECTOMY  01/03/2020   Procedure: POLYPECTOMY;  Surgeon: Rush Landmark Telford Nab., MD;  Location: [place];  Service: Gastroenterology;;   SUBMUCOSAL TATTOO INJECTION  01/03/2020   Procedure: SUBMUCOSAL TATTOO INJECTION;  Surgeon: Irving Copas., MD;  Location: [place];  Service: Gastroenterology;;   TONSILLECTOMY      TOTAL HIP ARTHROPLASTY Left 05/31/2014   Procedure: LEFT TOTAL HIP ARTHROPLASTY ANTERIOR APPROACH;  Surgeon: Mauri Pole, MD;  Location: [facility];  Service: Orthopedics;  Laterality: Left;    Prior to Admission medications   Medication Sig Start Date End Date Taking? Authorizing Provider  acetaminophen (TYLENOL) 500 MG tablet Take 500 mg by mouth daily as needed for mild pain.   Yes [provider]  amLODipine (NORVASC) 10 MG tablet Take 10 mg by mouth daily. 08/28/18  Yes [provider]  Cholecalciferol (VITAMIN D3) 50 MCG (2000 UT) capsule Take 2,000 Units by mouth daily.   Yes [provider]  hydrochlorothiazide (HYDRODIURIL) 25 MG tablet Take 25 mg by mouth daily. 06/15/19  Yes [provider]  hydrocortisone cream 1 % Apply 1 application  topically daily as needed for itching.   Yes [provider]  latanoprost (XALATAN) 0.005 % ophthalmic solution Place 1 drop into both eyes at bedtime.  09/11/18  Yes [provider]  loperamide (IMODIUM A-D) 2 MG tablet Take 2 mg by mouth 4 (four) times daily as needed for diarrhea or loose stools.   Yes [provider]  pantoprazole (PROTONIX) 40 MG tablet TAKE 1 TABLET(40 MG) BY MOUTH DAILY Patient taking differently: Take 40 mg by mouth daily. 12/19/21  Yes Mansouraty, Telford Nab., MD  potassium chloride (KLOR-CON) 10 MEQ tablet Take 10 mEq by mouth daily.   Yes [provider]  ondansetron (ZOFRAN) 4 MG tablet Take 1 tablet (4 mg total) by mouth every 6 (six) hours as needed for nausea or vomiting. Patient not taking: Reported on 07/28/2020 07/24/20   Gareth Morgan, MD  tiZANidine (ZANAFLEX) 4 MG tablet Take 1 tablet (4 mg total) by mouth every 8 (eight) hours as needed for muscle spasms. Patient taking differently: Take 4 mg by mouth at bedtime. 06/02/14   Danae Orleans, PA-C    Scheduled Meds:  amLODipine  10 mg Oral Daily   aspirin EC  81 mg Oral Daily   latanoprost  1 drop  Both Eyes QHS   pantoprazole  40 mg Oral BID   sodium chloride flush  3 mL Intravenous Q12H   tiZANidine  4 mg Oral QHS   Infusions:  sodium chloride     heparin 700 Units/hr (02/13/22 0600)   PRN Meds: sodium chloride, acetaminophen **OR** acetaminophen, HYDROcodone-acetaminophen, morphine injection, sodium chloride flush   Allergies as of 02/12/2022 - Review Complete 02/12/2022  Allergen Reaction Noted   Ferrous gluconate  07/28/2020   Anesthesia s-i-40 [propofol] Nausea And Vomiting 11/20/2019   Codeine Nausea And Vomiting 11/20/2019   Meperidine hcl Nausea And Vomiting    Pneumococcal vac polyvalent  04/28/2020   Simvastatin  04/28/2020    Family History  Problem Relation Age of Onset   Colon cancer Mother 34       Died age 41   Heart attack Father 19       Died in his sleep of an MI   Lung cancer Sister        LLL resection, otherwise no treatment; now recurred   Bladder Cancer Sister        Likely primary   Renal cancer Sister    Breast cancer Sister    Heart disease Brother    Heart disease Brother    Esophageal cancer Neg Hx    Inflammatory bowel disease Neg Hx    Liver disease Neg Hx    Pancreatic cancer Neg Hx    Stomach cancer Neg Hx     Social History   Socioeconomic History   Marital status: Widowed    Spouse name: Not on file   Number of children: 3   Years of education: Not on file   Highest education level: Not on file  Occupational History   Occupation: Retired Designer, multimedia at [employer] Use   Smoking status: Former    Packs/day: 0.30    Types: Cigarettes    Start date: 1955    Quit date: 2005    Years since quitting: 18.5   Smokeless tobacco: Never  Vaping Use   Vaping Use: Never used  Substance and Sexual Activity   Alcohol use: Not Currently    Comment: rare    Drug use: No   Sexual  activity: Not on file  Other Topics Concern   Not on file  Social History Narrative   Not on file   Social Determinants of  Health   Financial Resource Strain: Not on file  Food Insecurity: Not on file  Transportation Needs: Not on file  Physical Activity: Not on file  Stress: Not on file  Social Connections: Not on file  Intimate Partner Violence: Not on file    REVIEW OF SYSTEMS: Constitutional: Generally patient is active during the day not physically so but she plays bridge 3 times a week.  She reads, watches television, is entirely capable of walking to the dining hall.  She does not take naps. ENT:  No nose bleeds Pulm: No shortness of breath, no cough. CV:  No palpitations, no LE edema.  Nonexertional chest pain GU:  No hematuria, no frequency.  No dysuria GI: See HPI. Heme: No unusual bleeding or bruising Transfusions: Received blood transfusion in 1963. Neuro:  No headaches, no peripheral tingling or numbness Derm:  No itching, no rash or sores.  Endocrine:  No sweats or chills.  No polyuria or dysuria Immunization: Reviewed.  She is up-to-date on multiple vaccines including COVID-19 Travel:  None beyond local counties in last few months.    PHYSICAL EXAM: Vital signs in last 24 hours: Vitals:   02/13/22 0524 02/13/22 0714  BP: (!) 153/70 (!) 154/65  Pulse: 99 81  Resp: 16 20  Temp: (!) 97.4 F (36.3 C) (!) 97.5 F (36.4 C)  SpO2: 95% 98%   Wt Readings from Last 3 Encounters:  02/13/22 66.4 kg  02/04/22 67.1 kg  08/06/21 67.6 kg    General: Pleasant, well-appearing.  Not jaundiced.  Looks younger than stated age.  Comfortable Head: No facial asymmetry or swelling.  No signs of head trauma Eyes: No scleral icterus.  No conjunctival pallor. Ears: No obvious hearing deficit Nose: No congestion or discharge Mouth: Oral mucosa is moist, pink, clear.  Tongue midline. Neck: No JVD, no masses, no thyromegaly Lungs: Clear bilaterally.  No labored breathing, no cough. Heart: RRR.  No MRG.  S1, S2 present Abdomen: Soft.  Not tender.  Not distended.  Active bowel sounds.  No HSM, masses,  bruits, hernias.   Rectal: Deferred Musc/Skeltl: No joint redness, swelling or gross deformity.  Arthritic changes in the fingers Extremities: No CCE. Neurologic: Oriented x3.  Moves all 4 limbs without tremor, strength not tested. Skin: No rash, sores, suspicious lesions. Nodes: No cervical adenopathy Psych: Cooperative, calm, pleasant, fluid speech.  Intake/Output from previous day: 07/25 0701 - 07/26 0700 In: 279 [I.V.:55.2; IV Piggyback:223.8] Out: -  Intake/Output this shift: No intake/output data recorded.  LAB RESULTS: Recent Labs    02/12/22 2009 02/13/22 0300 02/13/22 0510  WBC 9.8 12.8* 9.5  HGB 14.4 15.3* 15.2*  HCT 41.3 43.8 42.8  PLT 295 294 281   BMET Lab Results  Component Value Date   NA 137 02/13/2022   NA 136 02/12/2022   NA 139 02/05/2021   K 3.0 (L) 02/13/2022   K 2.8 (L) 02/12/2022   K 4.0 02/05/2021   CL 100 02/13/2022   CL 101 02/12/2022   CL 98 02/05/2021   CO2 27 02/13/2022   CO2 25 02/12/2022   CO2 29 02/05/2021   GLUCOSE 111 (H) 02/13/2022   GLUCOSE 139 (H) 02/12/2022   GLUCOSE 92 02/05/2021   BUN 15 02/13/2022   BUN 21 02/12/2022   BUN 17 02/05/2021   CREATININE 1.19 (  H) 02/13/2022   CREATININE 1.38 (H) 02/12/2022   CREATININE 1.12 (H) 02/05/2021   CALCIUM 9.0 02/13/2022   CALCIUM 9.3 02/12/2022   CALCIUM 9.9 02/05/2021   LFT Recent Labs    02/12/22 2009 02/13/22 0510  PROT 6.6 6.7  ALBUMIN 3.8 3.7  AST 33 183*  ALT 17 110*  ALKPHOS 97 102  BILITOT 0.4 1.2  BILIDIR <0.1  --   IBILI NOT CALCULATED  --    PT/INR Lab Results  Component Value Date   INR 0.97 05/25/2014   Hepatitis Panel No results for input(s): "HEPBSAG", "HCVAB", "HEPAIGM", "HEPBIGM" in the last 72 hours. C-Diff No components found for: "CDIFF" Lipase     Component Value Date/Time   LIPASE 667 (H) 02/13/2022 0300    Drugs of Abuse  No results found for: "LABOPIA", "COCAINSCRNUR", "LABBENZ", "AMPHETMU", "THCU", "LABBARB"   RADIOLOGY  STUDIES: CT Angio Chest/Abd/Pel for Dissection W and/or Wo Contrast  Result Date: 02/12/2022 CLINICAL DATA:  Chest or back pain with aortic dissection suspected. Substernal chest pain this afternoon radiating to the abdomen. EXAM: CT ANGIOGRAPHY CHEST, ABDOMEN AND PELVIS TECHNIQUE: Non-contrast CT of the chest was initially obtained. Multidetector CT imaging through the chest, abdomen and pelvis was performed using the standard protocol during bolus administration of intravenous contrast. Multiplanar reconstructed images and MIPs were obtained and reviewed to evaluate the vascular anatomy. RADIATION DOSE REDUCTION: This exam was performed according to the departmental dose-optimization program which includes automated exposure control, adjustment of the mA and/or kV according to patient size and/or use of iterative reconstruction technique. CONTRAST:  79m OMNIPAQUE IOHEXOL 350 MG/ML SOLN COMPARISON:  02/05/2021 FINDINGS: CTA CHEST FINDINGS Cardiovascular: Unenhanced images of the chest demonstrate calcification of the aorta and coronary arteries. No evidence of intramural hematoma. Images obtained during arterial phase after intravenous contrast material administration demonstrate normal caliber thoracic aorta. No aortic dissection. Great vessel origins are patent. Normal heart size. No pericardial effusions. Central pulmonary arteries are patent without evidence of significant pulmonary embolus. Mediastinum/Nodes: Thyroid gland is unremarkable. Large esophageal hiatal hernia. Esophagus is decompressed. No significant lymphadenopathy. Lungs/Pleura: Mild atelectasis in the lungs. Focal subpleural opacity in the right upper lung is unchanged since prior study. Tiny pulmonary nodules are unchanged. Musculoskeletal: Degenerative changes.  No acute bony abnormalities. Review of the MIP images confirms the above findings. CTA ABDOMEN AND PELVIS FINDINGS VASCULAR Aorta: Diffuse aortic calcification. Normal caliber  abdominal aorta. No dissection. Celiac: Focal calcific stenosis of the origin of the celiac axis, representing about 50% diameter reduction. Prompt reconstitution. SMA: The superior mesenteric artery is small in caliber but appears patent. Focal proximal stenosis with about 40% diameter reduction. Renals: Renal arteries are patent bilaterally. No aneurysm or occlusion. Nephrograms are symmetrical. IMA: Inferior mesenteric artery is patent. Inflow: Patent without evidence of aneurysm, dissection, vasculitis or significant stenosis. Veins: No obvious venous abnormality within the limitations of this arterial phase study. Review of the MIP images confirms the above findings. NON-VASCULAR Hepatobiliary: No focal liver abnormality is seen. No gallstones, gallbladder wall thickening, or biliary dilatation. Pancreas: Unremarkable. No pancreatic ductal dilatation or surrounding inflammatory changes. Spleen: Normal in size without focal abnormality. Adrenals/Urinary Tract: Adrenal glands are unremarkable. Kidneys are normal, without renal calculi, focal lesion, or hydronephrosis. Bladder is unremarkable. Stomach/Bowel: Stomach, small bowel, and colon are not abnormally distended. Partial right hemicolectomy with ileocolonic anastomosis. No wall thickening or inflammatory changes. Lymphatic: Nonspecific lymphadenopathy in the mesentery with mild mesenteric stranding. Largest lymph nodes measure up to about 8 mm  short axis dimension. Appearance is nonspecific. Changes are more prominent than on the prior study. Possibly inflammatory or reactive change but early metastatic disease could potentially have this appearance. Reproductive: Status post hysterectomy. No adnexal masses. Other: No abdominal wall hernia or abnormality. No abdominopelvic ascites. Musculoskeletal: Degenerative changes. No acute bony abnormalities. Postoperative change in the left hip. Review of the MIP images confirms the above findings. IMPRESSION: 1.  Aortic atherosclerosis. No evidence of aortic aneurysm or dissection. 2. No evidence of metastatic disease in the chest. 3. Postoperative right hemicolectomy with patent ileocolonic anastomosis. 4. Borderline prominence of mesenteric lymph nodes with mesenteric stranding, increased since prior study. Appearances are nonspecific. 5. Large esophageal hiatal hernia. 6. 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 Electronically Signed   By: Lucienne Capers M.D.   On: 02/12/2022 22:45   DG Chest Port 1 View  Result Date: 02/12/2022 CLINICAL DATA:  Chest pain EXAM: PORTABLE CHEST 1 VIEW COMPARISON:  07/24/2020 FINDINGS: Lungs are clear.  No pleural effusion or pneumothorax. The heart is normal in size.  Thoracic aortic atherosclerosis. Large hiatal hernia. IMPRESSION: No evidence of acute cardiopulmonary disease. Electronically Signed   By: Julian Hy M.D.   On: 02/12/2022 21:00      IMPRESSION:   Acute onset upper abdominal/epigastric pain radiating to back and chest.  Her troponins are mildly elevated.  Inferior wall motion abnormality on the echocardiogram so suspect she has had some kind of acute cardiac event.  Cardiac cath is planned.  In discussing symptoms with pt and given elevated lipase/transaminases, there may be a biliary component to her symptoms.  However only uncomplicated gallstones/sludge disease without biliary ductal dilatation seen on imaging.  Fortunately the pancreas looks fine on CT. ?did she pass some sludge or stone?  Colon cancer resected with left hemicolectomy/ileocolonic anastomosis in 01/2020.  Cancer free at 2 years.  Current CT shows no evidence of metastatic disease.  Just completed follow-up office visit with oncology a week ago.  ? Is she candidate for polyp/cancer screening at age 58, missed fup appt w him on 7/23.  Dr Ammie Dalton indicates no need for surveillnce imaging or colonoscopy  Hx anemia attributed  to blood loss from AVMs, Camerons lesions.  Not anemic by current Hgb. Treated w parenteral iron and PRBCs in past.   Last parenteral iron was 07/2020.  Last PRBCs in 01/2020.    PLAN:     Await findings of cardiac cath.  Dr Lyndel Safe to follow.  ? Need for MRCP  Okay to start a diet after the cardiac cath.  Ordered follow-up hepatic function profile and lipase for tomorrow.  Amylase was ordered earlier today with results pending.   Amber Park  02/13/2022, 9:26 AM Phone [phone]

## 2022-02-13 NOTE — Progress Notes (Signed)
OT Cancellation Note  Patient Details Name: Amber Park MRN: [id number] DOB: 24-Sep-1924   Cancelled Treatment:    Reason Eval/Treat Not Completed: Medical issues which prohibited therapy pt awaiting heart cath, cardiac NP requesting therapy hold until after cath  Layla Maw 02/13/2022, 10:59 AM

## 2022-02-13 NOTE — H&P (View-Only) (Signed)
Progress Note  Patient Name: Amber Park Date of Encounter: 02/13/2022  [facility] Cardiologist: Fransico Him, MD   Subjective   Feeling better- was vomiting at home prior to arrival, dry heaves.  Just completed Echo  Inpatient Medications    Scheduled Meds:  amLODipine  10 mg Oral Daily   aspirin EC  81 mg Oral Daily   latanoprost  1 drop Both Eyes QHS   pantoprazole  40 mg Oral BID   sodium chloride flush  3 mL Intravenous Q12H   tiZANidine  4 mg Oral QHS   Continuous Infusions:  sodium chloride     heparin 700 Units/hr (02/13/22 0600)   potassium chloride 10 mEq (02/13/22 0734)   PRN Meds: sodium chloride, acetaminophen **OR** acetaminophen, HYDROcodone-acetaminophen, morphine injection, sodium chloride flush   Vital Signs    Vitals:   02/13/22 0358 02/13/22 0427 02/13/22 0524 02/13/22 0714  BP: (!) 159/78 (!) 159/93 (!) 153/70 (!) 154/65  Pulse: 86 82 99 81  Resp: '20 18 16 20  '$ Temp: 97.6 F (36.4 C) (!) 97.4 F (36.3 C) (!) 97.4 F (36.3 C) (!) 97.5 F (36.4 C)  TempSrc: Oral Oral Oral Oral  SpO2: 97% 97% 95% 98%  Weight:  66.4 kg    Height:  '5\' 1"'$  (1.549 m)      Intake/Output Summary (Last 24 hours) at 02/13/2022 0801 Last data filed at 02/13/2022 0600 Gross per 24 hour  Intake 278.96 ml  Output --  Net 278.96 ml      02/13/2022    4:27 AM 02/12/2022   11:28 PM 02/04/2022    2:38 PM  Last 3 Weights  Weight (lbs) 146 lb 6.4 oz 148 lb 148 lb  Weight (kg) 66.407 kg 67.132 kg 67.132 kg      Telemetry    SR LBBB - Personally Reviewed  ECG    SR with LBBB PACs, LAD no acute changes from 07/2020 - Personally Reviewed  Physical Exam  Per Dr. Radford Pax GEN: No acute distress.   Neck: No JVD Cardiac: RRR, no murmurs, rubs, or gallops.  Respiratory: Clear to auscultation bilaterally. GI: Soft, nontender, non-distended + BS  MS: No edema; No deformity. Neuro:  Nonfocal  Psych: Normal affect   Labs    High Sensitivity Troponin:   Recent  Labs  Lab 02/12/22 2009 02/12/22 2211 02/12/22 2333 02/13/22 0300  TROPONINIHS 17 90* 123* 158*     Chemistry Recent Labs  Lab 02/12/22 2009 02/12/22 2211 02/12/22 2333 02/13/22 0510  NA 136  --   --  137  K 2.8*  --   --  3.0*  CL 101  --   --  100  CO2 25  --   --  27  GLUCOSE 139*  --   --  111*  BUN 21  --   --  15  CREATININE 1.38*  --   --  1.19*  CALCIUM 9.3  --   --  9.0  MG  --  1.5* 1.5* 2.2  PROT 6.6  --   --  6.7  ALBUMIN 3.8  --   --  3.7  AST 33  --   --  183*  ALT 17  --   --  110*  ALKPHOS 97  --   --  102  BILITOT 0.4  --   --  1.2  GFRNONAA 35*  --   --  42*  ANIONGAP 10  --   --  10  Lipids  Recent Labs  Lab 02/13/22 0510  CHOL 251*  TRIG 113  HDL 68  LDLCALC 160*  CHOLHDL 3.7    Hematology Recent Labs  Lab 02/12/22 2009 02/13/22 0300 02/13/22 0510  WBC 9.8 12.8* 9.5  RBC 5.07 5.31* 5.26*  HGB 14.4 15.3* 15.2*  HCT 41.3 43.8 42.8  MCV 81.5 82.5 81.4  MCH 28.4 28.8 28.9  MCHC 34.9 34.9 35.5  RDW 13.0 13.2 13.2  PLT 295 294 281   Thyroid  Recent Labs  Lab 02/13/22 0510  TSH 6.544*    BNPNo results for input(s): "BNP", "PROBNP" in the last 168 hours.  DDimer No results for input(s): "DDIMER" in the last 168 hours.   Radiology    CT Angio Chest/Abd/Pel for Dissection W and/or Wo Contrast  Result Date: 02/12/2022 CLINICAL DATA:  Chest or back pain with aortic dissection suspected. Substernal chest pain this afternoon radiating to the abdomen. EXAM: CT ANGIOGRAPHY CHEST, ABDOMEN AND PELVIS TECHNIQUE: Non-contrast CT of the chest was initially obtained. Multidetector CT imaging through the chest, abdomen and pelvis was performed using the standard protocol during bolus administration of intravenous contrast. Multiplanar reconstructed images and MIPs were obtained and reviewed to evaluate the vascular anatomy. RADIATION DOSE REDUCTION: This exam was performed according to the departmental dose-optimization program which includes  automated exposure control, adjustment of the mA and/or kV according to patient size and/or use of iterative reconstruction technique. CONTRAST:  65m OMNIPAQUE IOHEXOL 350 MG/ML SOLN COMPARISON:  02/05/2021 FINDINGS: CTA CHEST FINDINGS Cardiovascular: Unenhanced images of the chest demonstrate calcification of the aorta and coronary arteries. No evidence of intramural hematoma. Images obtained during arterial phase after intravenous contrast material administration demonstrate normal caliber thoracic aorta. No aortic dissection. Great vessel origins are patent. Normal heart size. No pericardial effusions. Central pulmonary arteries are patent without evidence of significant pulmonary embolus. Mediastinum/Nodes: Thyroid gland is unremarkable. Large esophageal hiatal hernia. Esophagus is decompressed. No significant lymphadenopathy. Lungs/Pleura: Mild atelectasis in the lungs. Focal subpleural opacity in the right upper lung is unchanged since prior study. Tiny pulmonary nodules are unchanged. Musculoskeletal: Degenerative changes.  No acute bony abnormalities. Review of the MIP images confirms the above findings. CTA ABDOMEN AND PELVIS FINDINGS VASCULAR Aorta: Diffuse aortic calcification. Normal caliber abdominal aorta. No dissection. Celiac: Focal calcific stenosis of the origin of the celiac axis, representing about 50% diameter reduction. Prompt reconstitution. SMA: The superior mesenteric artery is small in caliber but appears patent. Focal proximal stenosis with about 40% diameter reduction. Renals: Renal arteries are patent bilaterally. No aneurysm or occlusion. Nephrograms are symmetrical. IMA: Inferior mesenteric artery is patent. Inflow: Patent without evidence of aneurysm, dissection, vasculitis or significant stenosis. Veins: No obvious venous abnormality within the limitations of this arterial phase study. Review of the MIP images confirms the above findings. NON-VASCULAR Hepatobiliary: No focal liver  abnormality is seen. No gallstones, gallbladder wall thickening, or biliary dilatation. Pancreas: Unremarkable. No pancreatic ductal dilatation or surrounding inflammatory changes. Spleen: Normal in size without focal abnormality. Adrenals/Urinary Tract: Adrenal glands are unremarkable. Kidneys are normal, without renal calculi, focal lesion, or hydronephrosis. Bladder is unremarkable. Stomach/Bowel: Stomach, small bowel, and colon are not abnormally distended. Partial right hemicolectomy with ileocolonic anastomosis. No wall thickening or inflammatory changes. Lymphatic: Nonspecific lymphadenopathy in the mesentery with mild mesenteric stranding. Largest lymph nodes measure up to about 8 mm short axis dimension. Appearance is nonspecific. Changes are more prominent than on the prior study. Possibly inflammatory or reactive change but early metastatic disease  could potentially have this appearance. Reproductive: Status post hysterectomy. No adnexal masses. Other: No abdominal wall hernia or abnormality. No abdominopelvic ascites. Musculoskeletal: Degenerative changes. No acute bony abnormalities. Postoperative change in the left hip. Review of the MIP images confirms the above findings. IMPRESSION: 1. Aortic atherosclerosis. No evidence of aortic aneurysm or dissection. 2. No evidence of metastatic disease in the chest. 3. Postoperative right hemicolectomy with patent ileocolonic anastomosis. 4. Borderline prominence of mesenteric lymph nodes with mesenteric stranding, increased since prior study. Appearances are nonspecific. 5. Large esophageal hiatal hernia. 6. 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 Electronically Signed   By: Lucienne Capers M.D.   On: 02/12/2022 22:45   DG Chest Port 1 View  Result Date: 02/12/2022 CLINICAL DATA:  Chest pain EXAM: PORTABLE CHEST 1 VIEW COMPARISON:  07/24/2020 FINDINGS: Lungs are clear.  No pleural effusion or  pneumothorax. The heart is normal in size.  Thoracic aortic atherosclerosis. Large hiatal hernia. IMPRESSION: No evidence of acute cardiopulmonary disease. Electronically Signed   By: Julian Hy M.D.   On: 02/12/2022 21:00    Cardiac Studies   Echo ordered  Patient Profile     88 y.o. female  hx of colon cancer status post right colectomy, hypertension, GERD, chronic LBBB now admitted with chest pain last pm. Pk trop 158 along with elevated Lipase, AST and ALT  Assessment & Plan    NSTEMI with Trop 158 may be demand ischemia. -placed on ASA and Heparin -echo completed -high dose statin ordered -low dose BB and SL NTG. -possible cath  (cr 1.19)  though cardiac CTA may be enough.  Will defer to Dr. Radford Pax  -CTA of chest with calcification of the aorta and coronary arteries.  No aortic dissection, no PE, no gallstones Postoperative right hemicolectomy with patent ileocolonic Anastomosis.  Borderline prominence of mesenteric lymph nodes with mesenteric stranding, increased since prior study. Appearances are nonspecific.Large esophageal hiatal hernia.  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    Elevated Lipase at 667, AST 183 and ALT 110 -possible pancreatitis -check amylase    GERD/HH/colon cancer hx per IM  Hypomg+/hypokalemia    -1.5/3.0  rec'd 2 bags of Mg+ and 7 doses of KCL IV 10 meq  HTN -amlodipine 10   now 154/65        For questions or updates, please contact [place] Please consult [email or web address] for contact info under        Signed, Cecilie Kicks, NP  02/13/2022, 8:01 AM

## 2022-02-13 NOTE — Interval H&P Note (Signed)
History and Physical Interval Note:  02/13/2022 4:28 PM  Amber Park  has presented today for surgery, with the diagnosis of ACS/nstemi.  The various methods of treatment have been discussed with the patient and family. After consideration of risks, benefits and other options for treatment, the patient has consented to  Procedure(s): LEFT HEART CATH AND CORONARY ANGIOGRAPHY (N/A)  PERCUTANEOUS CORONARY INTERVENTION   as a surgical intervention.  The patient's history has been reviewed, patient examined, no change in status, stable for surgery.  I have reviewed the patient's chart and labs.  Questions were answered to the patient's satisfaction.    Cath Lab Visit (complete for each Cath Lab visit)  Clinical Evaluation Leading to the Procedure:   ACS: Yes.    Non-ACS:    Anginal Classification: CCS IV  Anti-ischemic medical therapy: Minimal Therapy (1 class of medications)  Non-Invasive Test Results: No non-invasive testing performed  Prior CABG: No previous CABG    Glenetta Hew

## 2022-02-13 NOTE — Progress Notes (Addendum)
ANTICOAGULATION CONSULT NOTE - Initial Consult  Pharmacy Consult for heparin Indication:  NSTEMI  Allergies  Allergen Reactions   Ferrous Gluconate     Back and chest pain, nausea, diarrhea, chills, hypotension, presyncope   Anesthesia S-I-40 [Propofol] Nausea And Vomiting    Anesthetics -- N/V   Codeine Nausea And Vomiting   Meperidine Hcl Nausea And Vomiting   Pneumococcal Vac Polyvalent     Other reaction(s): red and swollen   Simvastatin     Other reaction(s): muscle pain    Patient Measurements: Height: '5\' 1"'$  (154.9 cm) Weight: 66.4 kg (146 lb 6.2 oz) IBW/kg (Calculated) : 47.8 Heparin Dosing Weight: 60kg  Vital Signs: Temp: 97.5 F (36.4 C) (07/26 0714) Temp Source: Oral (07/26 0714) BP: 154/65 (07/26 0714) Pulse Rate: 81 (07/26 0714)  Labs: Recent Labs    02/12/22 2009 02/12/22 2211 02/12/22 2333 02/13/22 0300 02/13/22 0510 02/13/22 0955  HGB 14.4  --   --  15.3* 15.2*  --   HCT 41.3  --   --  43.8 42.8  --   PLT 295  --   --  294 281  --   HEPARINUNFRC  --   --   --  0.49  --  0.25*  CREATININE 1.38*  --   --   --  1.19*  --   CKTOTAL  --   --  95  --   --   --   TROPONINIHS 17 90* 123* 158*  --   --     Estimated Creatinine Clearance: 25.2 mL/min (A) (by C-G formula based on SCr of 1.19 mg/dL (H)).   Medical History: Past Medical History:  Diagnosis Date   Anemia    iron deficiency   Anxiety    Arthritis    AVM (arteriovenous malformation)    colon, stomach and bowel ( per progress note)   Cancer (HCC)    hx of skin cancer removed from nose    Family history of adverse reaction to anesthesia    Pt son has PONV   GERD (gastroesophageal reflux disease)    Glaucoma    Hearing aid worn    B/L   History of blood transfusion 1963   History of hiatal hernia    History of left bundle branch block    old ekg 06-23-11 dr Felipa Eth on chart   HOH (hard of hearing)    Hypertension    Pneumonia    hx of pneumonia x 4    PONV (postoperative  nausea and vomiting)    Shortness of breath dyspnea    with exertion    Wears glasses     Assessment: 88yo female c/o substernal CP radiating to abdomen and back with partial relief after SL NTG, initial troponin negative but now rising >> to begin heparin per pharmacy consult.   Heparin currently running at 700u/hr. Most recent heparin level from this AM 0.25 subtherapeutic. Heparin level drawn at 0300 at 0.49 but drawn 3 hrs after start of infusion. Will disregard result. Hgb 15.2, plt 281 stable. No s/sx bleeding per chart.   Goal of Therapy:  Heparin level 0.3-0.7 units/ml Monitor platelets by anticoagulation protocol: Yes   Plan:  Increase heparin infusion to 800 units/hr  Will f/u heparin level/aPTT in 8 hours Monitor daily heparin level and CBC Continue to monitor H&H    Gena Fray, PharmD PGY1 Pharmacy Resident   02/13/2022 11:16 AM

## 2022-02-13 NOTE — Progress Notes (Addendum)
Progress Note  Patient Name: Amber Park Date of Encounter: 02/13/2022  [facility] Cardiologist: Fransico Him, MD   Subjective   Feeling better- was vomiting at home prior to arrival, dry heaves.  Just completed Echo  Inpatient Medications    Scheduled Meds:  amLODipine  10 mg Oral Daily   aspirin EC  81 mg Oral Daily   latanoprost  1 drop Both Eyes QHS   pantoprazole  40 mg Oral BID   sodium chloride flush  3 mL Intravenous Q12H   tiZANidine  4 mg Oral QHS   Continuous Infusions:  sodium chloride     heparin 700 Units/hr (02/13/22 0600)   potassium chloride 10 mEq (02/13/22 0734)   PRN Meds: sodium chloride, acetaminophen **OR** acetaminophen, HYDROcodone-acetaminophen, morphine injection, sodium chloride flush   Vital Signs    Vitals:   02/13/22 0358 02/13/22 0427 02/13/22 0524 02/13/22 0714  BP: (!) 159/78 (!) 159/93 (!) 153/70 (!) 154/65  Pulse: 86 82 99 81  Resp: '20 18 16 20  '$ Temp: 97.6 F (36.4 C) (!) 97.4 F (36.3 C) (!) 97.4 F (36.3 C) (!) 97.5 F (36.4 C)  TempSrc: Oral Oral Oral Oral  SpO2: 97% 97% 95% 98%  Weight:  66.4 kg    Height:  '5\' 1"'$  (1.549 m)      Intake/Output Summary (Last 24 hours) at 02/13/2022 0801 Last data filed at 02/13/2022 0600 Gross per 24 hour  Intake 278.96 ml  Output --  Net 278.96 ml      02/13/2022    4:27 AM 02/12/2022   11:28 PM 02/04/2022    2:38 PM  Last 3 Weights  Weight (lbs) 146 lb 6.4 oz 148 lb 148 lb  Weight (kg) 66.407 kg 67.132 kg 67.132 kg      Telemetry    SR LBBB - Personally Reviewed  ECG    SR with LBBB PACs, LAD no acute changes from 07/2020 - Personally Reviewed  Physical Exam  Per Dr. Radford Pax GEN: No acute distress.   Neck: No JVD Cardiac: RRR, no murmurs, rubs, or gallops.  Respiratory: Clear to auscultation bilaterally. GI: Soft, nontender, non-distended + BS  MS: No edema; No deformity. Neuro:  Nonfocal  Psych: Normal affect   Labs    High Sensitivity Troponin:   Recent  Labs  Lab 02/12/22 2009 02/12/22 2211 02/12/22 2333 02/13/22 0300  TROPONINIHS 17 90* 123* 158*     Chemistry Recent Labs  Lab 02/12/22 2009 02/12/22 2211 02/12/22 2333 02/13/22 0510  NA 136  --   --  137  K 2.8*  --   --  3.0*  CL 101  --   --  100  CO2 25  --   --  27  GLUCOSE 139*  --   --  111*  BUN 21  --   --  15  CREATININE 1.38*  --   --  1.19*  CALCIUM 9.3  --   --  9.0  MG  --  1.5* 1.5* 2.2  PROT 6.6  --   --  6.7  ALBUMIN 3.8  --   --  3.7  AST 33  --   --  183*  ALT 17  --   --  110*  ALKPHOS 97  --   --  102  BILITOT 0.4  --   --  1.2  GFRNONAA 35*  --   --  42*  ANIONGAP 10  --   --  10  Lipids  Recent Labs  Lab 02/13/22 0510  CHOL 251*  TRIG 113  HDL 68  LDLCALC 160*  CHOLHDL 3.7    Hematology Recent Labs  Lab 02/12/22 2009 02/13/22 0300 02/13/22 0510  WBC 9.8 12.8* 9.5  RBC 5.07 5.31* 5.26*  HGB 14.4 15.3* 15.2*  HCT 41.3 43.8 42.8  MCV 81.5 82.5 81.4  MCH 28.4 28.8 28.9  MCHC 34.9 34.9 35.5  RDW 13.0 13.2 13.2  PLT 295 294 281   Thyroid  Recent Labs  Lab 02/13/22 0510  TSH 6.544*    BNPNo results for input(s): "BNP", "PROBNP" in the last 168 hours.  DDimer No results for input(s): "DDIMER" in the last 168 hours.   Radiology    CT Angio Chest/Abd/Pel for Dissection W and/or Wo Contrast  Result Date: 02/12/2022 CLINICAL DATA:  Chest or back pain with aortic dissection suspected. Substernal chest pain this afternoon radiating to the abdomen. EXAM: CT ANGIOGRAPHY CHEST, ABDOMEN AND PELVIS TECHNIQUE: Non-contrast CT of the chest was initially obtained. Multidetector CT imaging through the chest, abdomen and pelvis was performed using the standard protocol during bolus administration of intravenous contrast. Multiplanar reconstructed images and MIPs were obtained and reviewed to evaluate the vascular anatomy. RADIATION DOSE REDUCTION: This exam was performed according to the departmental dose-optimization program which includes  automated exposure control, adjustment of the mA and/or kV according to patient size and/or use of iterative reconstruction technique. CONTRAST:  65m OMNIPAQUE IOHEXOL 350 MG/ML SOLN COMPARISON:  02/05/2021 FINDINGS: CTA CHEST FINDINGS Cardiovascular: Unenhanced images of the chest demonstrate calcification of the aorta and coronary arteries. No evidence of intramural hematoma. Images obtained during arterial phase after intravenous contrast material administration demonstrate normal caliber thoracic aorta. No aortic dissection. Great vessel origins are patent. Normal heart size. No pericardial effusions. Central pulmonary arteries are patent without evidence of significant pulmonary embolus. Mediastinum/Nodes: Thyroid gland is unremarkable. Large esophageal hiatal hernia. Esophagus is decompressed. No significant lymphadenopathy. Lungs/Pleura: Mild atelectasis in the lungs. Focal subpleural opacity in the right upper lung is unchanged since prior study. Tiny pulmonary nodules are unchanged. Musculoskeletal: Degenerative changes.  No acute bony abnormalities. Review of the MIP images confirms the above findings. CTA ABDOMEN AND PELVIS FINDINGS VASCULAR Aorta: Diffuse aortic calcification. Normal caliber abdominal aorta. No dissection. Celiac: Focal calcific stenosis of the origin of the celiac axis, representing about 50% diameter reduction. Prompt reconstitution. SMA: The superior mesenteric artery is small in caliber but appears patent. Focal proximal stenosis with about 40% diameter reduction. Renals: Renal arteries are patent bilaterally. No aneurysm or occlusion. Nephrograms are symmetrical. IMA: Inferior mesenteric artery is patent. Inflow: Patent without evidence of aneurysm, dissection, vasculitis or significant stenosis. Veins: No obvious venous abnormality within the limitations of this arterial phase study. Review of the MIP images confirms the above findings. NON-VASCULAR Hepatobiliary: No focal liver  abnormality is seen. No gallstones, gallbladder wall thickening, or biliary dilatation. Pancreas: Unremarkable. No pancreatic ductal dilatation or surrounding inflammatory changes. Spleen: Normal in size without focal abnormality. Adrenals/Urinary Tract: Adrenal glands are unremarkable. Kidneys are normal, without renal calculi, focal lesion, or hydronephrosis. Bladder is unremarkable. Stomach/Bowel: Stomach, small bowel, and colon are not abnormally distended. Partial right hemicolectomy with ileocolonic anastomosis. No wall thickening or inflammatory changes. Lymphatic: Nonspecific lymphadenopathy in the mesentery with mild mesenteric stranding. Largest lymph nodes measure up to about 8 mm short axis dimension. Appearance is nonspecific. Changes are more prominent than on the prior study. Possibly inflammatory or reactive change but early metastatic disease  could potentially have this appearance. Reproductive: Status post hysterectomy. No adnexal masses. Other: No abdominal wall hernia or abnormality. No abdominopelvic ascites. Musculoskeletal: Degenerative changes. No acute bony abnormalities. Postoperative change in the left hip. Review of the MIP images confirms the above findings. IMPRESSION: 1. Aortic atherosclerosis. No evidence of aortic aneurysm or dissection. 2. No evidence of metastatic disease in the chest. 3. Postoperative right hemicolectomy with patent ileocolonic anastomosis. 4. Borderline prominence of mesenteric lymph nodes with mesenteric stranding, increased since prior study. Appearances are nonspecific. 5. Large esophageal hiatal hernia. 6. 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 Electronically Signed   By: Lucienne Capers M.D.   On: 02/12/2022 22:45   DG Chest Port 1 View  Result Date: 02/12/2022 CLINICAL DATA:  Chest pain EXAM: PORTABLE CHEST 1 VIEW COMPARISON:  07/24/2020 FINDINGS: Lungs are clear.  No pleural effusion or  pneumothorax. The heart is normal in size.  Thoracic aortic atherosclerosis. Large hiatal hernia. IMPRESSION: No evidence of acute cardiopulmonary disease. Electronically Signed   By: Julian Hy M.D.   On: 02/12/2022 21:00    Cardiac Studies   Echo ordered  Patient Profile     88 y.o. female  hx of colon cancer status post right colectomy, hypertension, GERD, chronic LBBB now admitted with chest pain last pm. Pk trop 158 along with elevated Lipase, AST and ALT  Assessment & Plan    NSTEMI with Trop 158 may be demand ischemia. -placed on ASA and Heparin -echo completed -high dose statin ordered -low dose BB and SL NTG. -possible cath  (cr 1.19)  though cardiac CTA may be enough.  Will defer to Dr. Radford Pax  -CTA of chest with calcification of the aorta and coronary arteries.  No aortic dissection, no PE, no gallstones Postoperative right hemicolectomy with patent ileocolonic Anastomosis.  Borderline prominence of mesenteric lymph nodes with mesenteric stranding, increased since prior study. Appearances are nonspecific.Large esophageal hiatal hernia.  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    Elevated Lipase at 667, AST 183 and ALT 110 -possible pancreatitis -check amylase    GERD/HH/colon cancer hx per IM  Hypomg+/hypokalemia    -1.5/3.0  rec'd 2 bags of Mg+ and 7 doses of KCL IV 10 meq  HTN -amlodipine 10   now 154/65        For questions or updates, please contact [place] Please consult [email or web address] for contact info under        Signed, Cecilie Kicks, NP  02/13/2022, 8:01 AM

## 2022-02-13 NOTE — Brief Op Note (Signed)
   Brief Cardiac Catheterization Note:  NAME:  Amber Park   MRN: [id number] DOB:  November 09, 1924   ADMIT DATE: 02/12/2022 Procedure Date: 02/13/2022  Primary Care Provider: Lajean Manes, MD Referring Cardiologist: Fransico Him, MD  SURGEON:  Surgeon(s) and Role: Leonie Man, MD - Primary   [name] is a 88 y.o. y/o female with a PMH notable for history of Colon Cancer (s/p R Colectomy), HTN, GERD, chronic LBBB, who presents for Invasive CV Evaluation via Cardiac Catheterization and possible Percutaneous Coronary Intervention. She was admitted overnight 7/25-7/26/2023 with chest pain and mild troponin elevation up to 158.  She also had elevated lipase AST and ALT.  She was admitted as a possible non-STEMI and referred for cardiac catheterization.  Preop Diagnosis: ACS/non-STEMI  Procedures: Left Heart Catheterization with Native Coroanry Angiography via 5 French Right Radial Artery access (Direct US Guided); left ventricular hemodynamics as well as LCA and RCA selective cineangiography performed using TIG 4.0 catheter.  LV gram was not performed as she has just had an echocardiogram. Hemostasis: Right radial sheath removed in the Cath Lab with Zephyr pneumatic band placed for hemostasis: 13 mL air at 1700  Medications: No sedation, local subcu lidocaine 3 mL Radial Cocktail ('3mg'$  Verapamil in 10 mL NS) 3500 units IV Heparin  Impression: Angiographically trivial CAD no more than 20% lesions in major vessels.  Distal tapering vessels fade out near the apex but no culprit lesion. Hemodynamics: LVEDP 18 mmHg.  Recommendations: Consider noncardiac etiology for chest pain, although cannot exclude microvascular disease/spasm, but no catheterization evidence to suggest that Zephyr band removal per protocol. We will DC IV heparin Stable from a cardiac cath standpoint for discharge in the morning   Full note to follow  Glenetta Hew, M.D., M.S. Interventional Cardiologist    Pager # [phone] Phone # [phone] [address] [place], [place] [address]  02/13/2022 5:32 PM

## 2022-02-14 ENCOUNTER — Observation Stay (HOSPITAL_COMMUNITY): Payer: Medicare Other

## 2022-02-14 ENCOUNTER — Encounter (HOSPITAL_COMMUNITY): Payer: Self-pay | Admitting: Cardiology

## 2022-02-14 DIAGNOSIS — I10 Essential (primary) hypertension: Secondary | ICD-10-CM | POA: Diagnosis not present

## 2022-02-14 DIAGNOSIS — R7989 Other specified abnormal findings of blood chemistry: Secondary | ICD-10-CM

## 2022-02-14 DIAGNOSIS — R935 Abnormal findings on diagnostic imaging of other abdominal regions, including retroperitoneum: Secondary | ICD-10-CM | POA: Diagnosis not present

## 2022-02-14 DIAGNOSIS — I214 Non-ST elevation (NSTEMI) myocardial infarction: Secondary | ICD-10-CM | POA: Diagnosis not present

## 2022-02-14 DIAGNOSIS — R1013 Epigastric pain: Secondary | ICD-10-CM | POA: Diagnosis not present

## 2022-02-14 DIAGNOSIS — C182 Malignant neoplasm of ascending colon: Secondary | ICD-10-CM | POA: Diagnosis not present

## 2022-02-14 DIAGNOSIS — E876 Hypokalemia: Secondary | ICD-10-CM | POA: Diagnosis not present

## 2022-02-14 DIAGNOSIS — K219 Gastro-esophageal reflux disease without esophagitis: Secondary | ICD-10-CM | POA: Diagnosis not present

## 2022-02-14 LAB — COMPREHENSIVE METABOLIC PANEL
ALT: 106 U/L — ABNORMAL HIGH (ref 0–44)
AST: 138 U/L — ABNORMAL HIGH (ref 15–41)
Albumin: 3.1 g/dL — ABNORMAL LOW (ref 3.5–5.0)
Alkaline Phosphatase: 93 U/L (ref 38–126)
Anion gap: 7 (ref 5–15)
BUN: 12 mg/dL (ref 8–23)
CO2: 23 mmol/L (ref 22–32)
Calcium: 8.4 mg/dL — ABNORMAL LOW (ref 8.9–10.3)
Chloride: 103 mmol/L (ref 98–111)
Creatinine, Ser: 1.05 mg/dL — ABNORMAL HIGH (ref 0.44–1.00)
GFR, Estimated: 49 mL/min — ABNORMAL LOW (ref >60)
Glucose, Bld: 103 mg/dL — ABNORMAL HIGH (ref 70–99)
Potassium: 4.4 mmol/L (ref 3.5–5.1)
Sodium: 133 mmol/L — ABNORMAL LOW (ref 135–145)
Total Bilirubin: 1 mg/dL (ref 0.3–1.2)
Total Protein: 5.7 g/dL — ABNORMAL LOW (ref 6.5–8.1)

## 2022-02-14 LAB — CBC
HCT: 39 % (ref 36.0–46.0)
Hemoglobin: 13.4 g/dL (ref 12.0–15.0)
MCH: 28.6 pg (ref 26.0–34.0)
MCHC: 34.4 g/dL (ref 30.0–36.0)
MCV: 83.3 fL (ref 80.0–100.0)
Platelets: 239 10*3/uL (ref 150–400)
RBC: 4.68 MIL/uL (ref 3.87–5.11)
RDW: 13.3 % (ref 11.5–15.5)
WBC: 8.2 10*3/uL (ref 4.0–10.5)
nRBC: 0 % (ref 0.0–0.2)

## 2022-02-14 LAB — MAGNESIUM: Magnesium: 1.8 mg/dL (ref 1.7–2.4)

## 2022-02-14 LAB — LIPASE, BLOOD: Lipase: 53 U/L — ABNORMAL HIGH (ref 11–51)

## 2022-02-14 MED ORDER — POTASSIUM CHLORIDE 10 MEQ/100ML IV SOLN
10.0000 meq | Freq: Once | INTRAVENOUS | Status: AC
  Administered 2022-02-14: 10 meq via INTRAVENOUS
  Filled 2022-02-14: qty 100

## 2022-02-14 NOTE — Care Management Obs Status (Signed)
[facility] NOTIFICATION   Patient Details  Name: YURIANNA TUSING MRN: [id number] Date of Birth: 01-13-1925   Medicare Observation Status Notification Given:  Yes    Bethena Roys, RN 02/14/2022, 11:23 AM

## 2022-02-14 NOTE — Progress Notes (Addendum)
[facility] Progress Note  CC:  Chest pain/epigastric abdominal pain  Subjective:  Feeling much better.  No pain since she was admitted.  Cath without definite cause of pain.  Tolerated diet without pain or nausea/vomiting.  Was anticipating going home today.  Objective:  Vital signs in last 24 hours: Temp:  [97.4 F (36.3 C)-97.6 F (36.4 C)] 97.6 F (36.4 C) (07/27 0800) Pulse Rate:  [73-93] 80 (07/27 0800) Resp:  [16-26] 18 (07/27 0800) BP: (145-166)/(46-69) 145/60 (07/27 0800) SpO2:  [90 %-96 %] 96 % (07/27 0800) Weight:  [66.4 kg] 66.4 kg (07/26 1052) Last BM Date :  (pta) General:  Alert, Well-developed, in NAD Heart:  Regular rate and rhythm; no murmurs Pulm:  CTAB.  No W/R/R. Abdomen:  Soft, non-distended.  BS present.  Non-tender. Extremities:  Without edema. Neurologic:  Alert and oriented x 4;  grossly normal neurologically. Psych:  Alert and cooperative. Normal mood and affect.  Intake/Output from previous day: 07/26 0701 - 07/27 0700 In: 1282.2 [P.O.:240; I.V.:469.2; IV Piggyback:573] Out: -   Lab Results: Recent Labs    02/13/22 0300 02/13/22 0510 02/14/22 0153  WBC 12.8* 9.5 8.2  HGB 15.3* 15.2* 13.4  HCT 43.8 42.8 39.0  PLT 294 281 239   BMET Recent Labs    02/12/22 2009 02/13/22 0510 02/14/22 0153  NA 136 137 133*  K 2.8* 3.0* 4.4  CL 101 100 103  CO2 '25 27 23  '$ GLUCOSE 139* 111* 103*  BUN '21 15 12  '$ CREATININE 1.38* 1.19* 1.05*  CALCIUM 9.3 9.0 8.4*   LFT Recent Labs    02/12/22 2009 02/13/22 0510 02/14/22 0153  PROT 6.6   < > 5.7*  ALBUMIN 3.8   < > 3.1*  AST 33   < > 138*  ALT 17   < > 106*  ALKPHOS 97   < > 93  BILITOT 0.4   < > 1.0  BILIDIR <0.1  --   --   IBILI NOT CALCULATED  --   --    < > = values in this interval not displayed.   CARDIAC CATHETERIZATION  Result Date: 02/14/2022   Mid LAD to Dist LAD lesion is 20% stenosed with 20% stenosed side branch in 2nd Diag.   Dist LAD lesion is 50%  stenosed.  Essentially the vessel tapers distally after the major second diagonal branch.   Ost RCA to Prox RCA lesion is 30% stenosed.  (Mild catheter related spasm)   Prox RCA lesion is 20% stenosed.   -----------------------------------------------------------------   The left ventricular systolic function is normal by recent echocardiogram.   LV end diastolic pressure is mildly elevated.   There is no aortic valve stenosis. Impression: Angiographically trivial CAD no more than 20% lesions in major vessels.  Distal tapering vessels fade out near the apex but no culprit lesion. Hemodynamics: LVEDP 18 mmHg.  Recommendations: Consider noncardiac etiology for chest pain, although cannot exclude microvascular disease/spasm, but no catheterization evidence to suggest that Zephyr band removal per protocol. DC IV heparin Stable from a Cardiac Cath standpoint for discharge in the morning Glenetta Hew, MD  ECHOCARDIOGRAM COMPLETE  Result Date: 02/13/2022    ECHOCARDIOGRAM REPORT   Patient Name:   Amber Park Date of Exam: 02/13/2022 Medical Rec #:  [id number]      Height:       61.0 in Accession #:    [id number]     Weight:  146.4 lb Date of Birth:  04-11-1925      BSA:          1.654 m Patient Age:    88 years       BP:           154/65 mmHg Patient Gender: F              HR:           81 bpm. Exam Location:  Inpatient Procedure: 2D Echo, Cardiac Doppler and Color Doppler Indications:    Elevated troponin  History:        Patient has no prior history of Echocardiogram examinations.                 Risk Factors:Hypertension.  Sonographer:    Jefferey Pica Referring Phys: [place]  1. Left ventricular ejection fraction, by estimation, is 55 to 60%. The left ventricle has normal function. The left ventricle has no regional wall motion abnormalities. There is mild left ventricular hypertrophy. Left ventricular diastolic parameters are indeterminate.  2. Right ventricular systolic  function is normal. The right ventricular size is normal. There is normal pulmonary artery systolic pressure. The estimated right ventricular systolic pressure is 89.3 mmHg.  3. The mitral valve is normal in structure. Trivial mitral valve regurgitation. No evidence of mitral stenosis.  4. The aortic valve is tricuspid. There is mild calcification of the aortic valve. Aortic valve regurgitation is trivial. No aortic stenosis is present.  5. The inferior vena cava is normal in size with greater than 50% respiratory variability, suggesting right atrial pressure of 3 mmHg. FINDINGS  Left Ventricle: Left ventricular ejection fraction, by estimation, is 55 to 60%. The left ventricle has normal function. The left ventricle has no regional wall motion abnormalities. The left ventricular internal cavity size was normal in size. There is  mild left ventricular hypertrophy. Left ventricular diastolic parameters are indeterminate. Right Ventricle: The right ventricular size is normal. No increase in right ventricular wall thickness. Right ventricular systolic function is normal. There is normal pulmonary artery systolic pressure. The tricuspid regurgitant velocity is 2.79 m/s, and  with an assumed right atrial pressure of 3 mmHg, the estimated right ventricular systolic pressure is 81.0 mmHg. Left Atrium: Left atrial size was normal in size. Right Atrium: Right atrial size was normal in size. Pericardium: There is no evidence of pericardial effusion. Mitral Valve: The mitral valve is normal in structure. Trivial mitral valve regurgitation. No evidence of mitral valve stenosis. Tricuspid Valve: The tricuspid valve is normal in structure. Tricuspid valve regurgitation is mild . No evidence of tricuspid stenosis. Aortic Valve: The aortic valve is tricuspid. There is mild calcification of the aortic valve. Aortic valve regurgitation is trivial. No aortic stenosis is present. Aortic valve peak gradient measures 9.7 mmHg. Pulmonic  Valve: The pulmonic valve was normal in structure. Pulmonic valve regurgitation is not visualized. No evidence of pulmonic stenosis. Aorta: The aortic root is normal in size and structure. Venous: The inferior vena cava is normal in size with greater than 50% respiratory variability, suggesting right atrial pressure of 3 mmHg. IAS/Shunts: No atrial level shunt detected by color flow Doppler.  LEFT VENTRICLE PLAX 2D LVIDd:         3.50 cm   Diastology LVIDs:         2.70 cm   LV e' medial:    7.51 cm/s LV PW:         1.15 cm  LV E/e' medial:  12.2 LV IVS:        1.15 cm   LV e' lateral:   6.76 cm/s LVOT diam:     2.00 cm   LV E/e' lateral: 13.6 LV SV:         61 LV SV Index:   37 LVOT Area:     3.14 cm  RIGHT VENTRICLE             IVC RV S prime:     14.20 cm/s  IVC diam: 1.20 cm TAPSE (M-mode): 2.2 cm LEFT ATRIUM             Index        RIGHT ATRIUM           Index LA diam:        3.25 cm 1.96 cm/m   RA Area:     13.70 cm LA Vol (A2C):   28.7 ml 17.35 ml/m  RA Volume:   32.60 ml  19.71 ml/m LA Vol (A4C):   33.7 ml 20.37 ml/m LA Biplane Vol: 31.2 ml 18.86 ml/m  AORTIC VALVE                 PULMONIC VALVE AV Area (Vmax): 1.87 cm     PV Vmax:       1.11 m/s AV Vmax:        156.00 cm/s  PV Peak grad:  4.9 mmHg AV Peak Grad:   9.7 mmHg LVOT Vmax:      93.10 cm/s LVOT Vmean:     55.700 cm/s LVOT VTI:       0.195 m  AORTA Ao Root diam: 2.95 cm Ao Asc diam:  3.30 cm MITRAL VALVE                TRICUSPID VALVE MV Area (PHT): 8.25 cm     TR Peak grad:   31.1 mmHg MV Decel Time: 92 msec      TR Vmax:        279.00 cm/s MV E velocity: 91.80 cm/s MV A velocity: 146.00 cm/s  SHUNTS MV E/A ratio:  0.63         Systemic VTI:  0.20 m                             Systemic Diam: 2.00 cm Cherlynn Kaiser MD Electronically signed by Cherlynn Kaiser MD Signature Date/Time: 02/13/2022/12:49:36 PM    Final    US ABDOMEN LIMITED RUQ (LIVER/GB)  Result Date: 02/13/2022 CLINICAL DATA:  88 year old female with abnormal lipase.  Epigastric pain. EXAM: ULTRASOUND ABDOMEN LIMITED RIGHT UPPER QUADRANT COMPARISON:  CTA chest abdomen and pelvis 02/12/2022. FINDINGS: Gallbladder: Gallbladder appears mildly distended similar to the CTA yesterday, with normal wall thickness. However, there is some dependent echogenic material suspicious for small gallstones in addition to sludge (image 30), approximately 8 mm. No pericholecystic fluid. No sonographic Murphy sign elicited. Common bile duct: Diameter: 5 mm, normal. Liver: Echogenic liver with probable fatty sparing at the gallbladder fossa (image 66). No other discrete liver lesion. No intrahepatic biliary ductal dilatation. Portal vein is patent on color Doppler imaging with normal direction of blood flow towards the liver. Other: Negative visible right kidney. IMPRESSION: 1. Evidence of sludge and small gallstones, but no strong evidence of acute cholecystitis. 2. No evidence of bile duct obstruction. 3. Hepatic steatosis. Electronically Signed   By: Herminio Heads.D.  On: 02/13/2022 11:23   CT Angio Chest/Abd/Pel for Dissection W and/or Wo Contrast  Result Date: 02/12/2022 CLINICAL DATA:  Chest or back pain with aortic dissection suspected. Substernal chest pain this afternoon radiating to the abdomen. EXAM: CT ANGIOGRAPHY CHEST, ABDOMEN AND PELVIS TECHNIQUE: Non-contrast CT of the chest was initially obtained. Multidetector CT imaging through the chest, abdomen and pelvis was performed using the standard protocol during bolus administration of intravenous contrast. Multiplanar reconstructed images and MIPs were obtained and reviewed to evaluate the vascular anatomy. RADIATION DOSE REDUCTION: This exam was performed according to the departmental dose-optimization program which includes automated exposure control, adjustment of the mA and/or kV according to patient size and/or use of iterative reconstruction technique. CONTRAST:  81m OMNIPAQUE IOHEXOL 350 MG/ML SOLN COMPARISON:  02/05/2021  FINDINGS: CTA CHEST FINDINGS Cardiovascular: Unenhanced images of the chest demonstrate calcification of the aorta and coronary arteries. No evidence of intramural hematoma. Images obtained during arterial phase after intravenous contrast material administration demonstrate normal caliber thoracic aorta. No aortic dissection. Great vessel origins are patent. Normal heart size. No pericardial effusions. Central pulmonary arteries are patent without evidence of significant pulmonary embolus. Mediastinum/Nodes: Thyroid gland is unremarkable. Large esophageal hiatal hernia. Esophagus is decompressed. No significant lymphadenopathy. Lungs/Pleura: Mild atelectasis in the lungs. Focal subpleural opacity in the right upper lung is unchanged since prior study. Tiny pulmonary nodules are unchanged. Musculoskeletal: Degenerative changes.  No acute bony abnormalities. Review of the MIP images confirms the above findings. CTA ABDOMEN AND PELVIS FINDINGS VASCULAR Aorta: Diffuse aortic calcification. Normal caliber abdominal aorta. No dissection. Celiac: Focal calcific stenosis of the origin of the celiac axis, representing about 50% diameter reduction. Prompt reconstitution. SMA: The superior mesenteric artery is small in caliber but appears patent. Focal proximal stenosis with about 40% diameter reduction. Renals: Renal arteries are patent bilaterally. No aneurysm or occlusion. Nephrograms are symmetrical. IMA: Inferior mesenteric artery is patent. Inflow: Patent without evidence of aneurysm, dissection, vasculitis or significant stenosis. Veins: No obvious venous abnormality within the limitations of this arterial phase study. Review of the MIP images confirms the above findings. NON-VASCULAR Hepatobiliary: No focal liver abnormality is seen. No gallstones, gallbladder wall thickening, or biliary dilatation. Pancreas: Unremarkable. No pancreatic ductal dilatation or surrounding inflammatory changes. Spleen: Normal in size  without focal abnormality. Adrenals/Urinary Tract: Adrenal glands are unremarkable. Kidneys are normal, without renal calculi, focal lesion, or hydronephrosis. Bladder is unremarkable. Stomach/Bowel: Stomach, small bowel, and colon are not abnormally distended. Partial right hemicolectomy with ileocolonic anastomosis. No wall thickening or inflammatory changes. Lymphatic: Nonspecific lymphadenopathy in the mesentery with mild mesenteric stranding. Largest lymph nodes measure up to about 8 mm short axis dimension. Appearance is nonspecific. Changes are more prominent than on the prior study. Possibly inflammatory or reactive change but early metastatic disease could potentially have this appearance. Reproductive: Status post hysterectomy. No adnexal masses. Other: No abdominal wall hernia or abnormality. No abdominopelvic ascites. Musculoskeletal: Degenerative changes. No acute bony abnormalities. Postoperative change in the left hip. Review of the MIP images confirms the above findings. IMPRESSION: 1. Aortic atherosclerosis. No evidence of aortic aneurysm or dissection. 2. No evidence of metastatic disease in the chest. 3. Postoperative right hemicolectomy with patent ileocolonic anastomosis. 4. Borderline prominence of mesenteric lymph nodes with mesenteric stranding, increased since prior study. Appearances are nonspecific. 5. Large esophageal hiatal hernia. 6. 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 Electronically Signed   By: WLucienne CapersM.D.   On: 02/12/2022 22:45  DG Chest Port 1 View  Result Date: 02/12/2022 CLINICAL DATA:  Chest pain EXAM: PORTABLE CHEST 1 VIEW COMPARISON:  07/24/2020 FINDINGS: Lungs are clear.  No pleural effusion or pneumothorax. The heart is normal in size.  Thoracic aortic atherosclerosis. Large hiatal hernia. IMPRESSION: No evidence of acute cardiopulmonary disease. Electronically Signed   By: Julian Hy  M.D.   On: 02/12/2022 21:00    Assessment / Plan: Acute onset upper abdominal/epigastric pain radiating to back and chest.  Her troponins are mildly elevated.  Inferior wall motion abnormality on the echocardiogram so suspect she has had some kind of acute cardiac event.  Cardiac cath ok with no definite cardiac source identified, however.  In discussing symptoms with pt and given elevated lipase/transaminases, there may be a biliary component to her symptoms.  However only uncomplicated gallstones/sludge disease without biliary ductal dilatation seen on imaging.  Fortunately the pancreas looks fine on CT. ? did she pass some sludge or stone?  Lipase was 667, but down to 53 again today.  AST and ALT still elevated but improved and total bili/ALP remain normal.  No further pain since admission.   Colon cancer resected with left hemicolectomy/ileocolonic anastomosis in 01/2020.  Cancer free at 4 years.  Current CT shows no evidence of metastatic disease.  Just completed follow-up office visit with oncology a week ago.  ? Is she candidate for polyp/cancer screening at age 12, missed follow-up appt w him on 7/23.  Dr Ammie Dalton indicates no need for surveillnce imaging or colonoscopy.   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  Not anemic by current Hgb. Treated w parenteral iron and PRBCs in past. Last parenteral iron was 07/2020.  Last PRBCs in 01/2020.   GERD/large hiatal hernia:  On pantoprazole 40 mg BID, which she will continue.  -Will plan for MRCP if it can be done today to clear the biliary tract before discharge.  If not done today then she can be discharged and we can just repeat LFTs as outpatient next week.   LOS: 0 days   Laban Emperor. Zehr  02/14/2022, 9:17 AM     Attending physician's note   I have taken history, reviewed the chart and examined the patient. I performed a substantive portion of this encounter, including complete performance of at least one of the key  components, in conjunction with the APP. I agree with the Advanced Practitioner's note, impression and recommendations.   Pt doing much better. No further abdo pain. LFTs, lipase trending down  Ac biliary pancreatitis MRCP shows small stone vs debris in a nondilated distal CBD.  Plan: -Would observe for another 24 hours -Recheck CBC, CMP, lipase in a.m. -Not keen on getting ERCP or lap chole done. She is well known to Dr Rush Landmark and will discuss next steps with him in AM. -D/W in detail with the pt, pts daughter. I have also shown them MRCP films.   Carmell Austria, MD [facility] [phone]

## 2022-02-14 NOTE — Evaluation (Signed)
Occupational Therapy Evaluation Patient Details Name: Amber Park MRN: [id number] DOB: 1925-07-08 Today's Date: 02/14/2022   History of Present Illness 88 yo female presents to [facility] for substernal chest pain. Pt underwent L heart cath on 7/26. PMH includes GERD, skin cancer, anxiety, colon cancer s/p colectomy, HTN, glaucoma, L THA 2015.   Clinical Impression   PTA, pt from [facility], typically Modified Independent with ADLs, simple IADLs in apartment and mobility (no AD in apartment, Rollator outside). Pt still occasionally drives to run errands. Pt presents now with minor deficits in strength, endurance and dynamic standing balance. Pt able to demonstrate ADLs with Supervision to Modified Independence and mobility without AD on a min guard basis for safety. With increased activity, balance improved though pt limited by fatigue. Educated re: DME use (planning to obtain shower chair), initial supervision for showering tasks for safety, energy conservation, and gradual return to normal activities. Pt's daughter present and assisting pt throughout, endorses that family can assist as needed at DC. Anticipate no OT needs at DC though will continue to follow acutely to maximize safety/endurance with daily tasks.   HR 80-90s     Recommendations for follow up therapy are one component of a multi-disciplinary discharge planning process, led by the attending physician.  Recommendations may be updated based on patient status, additional functional criteria and insurance authorization.   Follow Up Recommendations  No OT follow up    Assistance Recommended at Discharge PRN  Patient can return home with the following      Functional Status Assessment  Patient has had a recent decline in their functional status and demonstrates the ability to make significant improvements in function in a reasonable and predictable amount of time.  Equipment Recommendations  Other  (comment) (Family looking into purchasing shower chair at DC)    Recommendations for Other Services       Precautions / Restrictions Precautions Precautions: Fall Precaution Comments: pt/family reports cardiology advised against pushing through R UE for 5 days after procedure Restrictions Weight Bearing Restrictions: No      Mobility Bed Mobility Overal bed mobility: Needs Assistance Bed Mobility: Supine to Sit, Sit to Supine     Supine to sit: Min assist, HOB elevated Sit to supine: Supervision   General bed mobility comments: MIn A to lift trunk with L UE as pt avoiding WB through RUE    Transfers Overall transfer level: Needs assistance Equipment used: 1 person hand held assist, None Transfers: Sit to/from Stand Sit to Stand: Min guard, Supervision           General transfer comment: min guard initially to stand from bedside progressing to supervision from toilet      Balance Overall balance assessment: Needs assistance Sitting-balance support: Feet supported, No upper extremity supported Sitting balance-Leahy Scale: Good     Standing balance support: No upper extremity supported, During functional activity Standing balance-Leahy Scale: Fair Standing balance comment: Fair+, close to good with pt able to ambulate short distances without AD though limitations evident                           ADL either performed or assessed with clinical judgement   ADL Overall ADL's : Needs assistance/impaired Eating/Feeding: Independent   Grooming: Set up;Standing;Wash/dry face;Wash/dry hands;Brushing hair;Oral care Grooming Details (indicate cue type and reason): no LOB and no assist once set up with materials Upper Body Bathing: Modified independent  Lower Body Bathing: Supervison/ safety;Sit to/from stand   Upper Body Dressing : Modified independent   Lower Body Dressing: Supervision/safety;Sit to/from stand   Toilet Transfer: Min  guard;Ambulation;Regular Glass blower/designer Details (indicate cue type and reason): no use of AD, light reaching out to furniture for confidence Toileting- Clothing Manipulation and Hygiene: Set up;Sit to/from stand Toileting - Clothing Manipulation Details (indicate cue type and reason): once set up in bathroom, no assist needed to complete task     Functional mobility during ADLs: Min guard General ADL Comments: Pt with minor weakness and endurance deficits from illness but moving fairly well. Pt's daughter present, hands on to assist pt. Encouraged family present for initial showering tasks for safety, educated on energy conservation and use of Rollator in apartment as needed for balance/endurance     Vision Baseline Vision/History: 1 Wears glasses Ability to See in Adequate Light: 0 Adequate Patient Visual Report: No change from baseline Vision Assessment?: No apparent visual deficits     Perception     Praxis      Pertinent Vitals/Pain Pain Assessment Pain Assessment: No/denies pain     Hand Dominance Right   Extremity/Trunk Assessment Upper Extremity Assessment Upper Extremity Assessment: Overall WFL for tasks assessed;RUE deficits/detail RUE Deficits / Details: pt/family report MD advised against putting pressure through UE for 5 days. advised ok to use R UE to assist with tasks and hold lightly to Rollator as needed. able to use functionally throughout with ADLS   Lower Extremity Assessment Lower Extremity Assessment: Defer to PT evaluation   Cervical / Trunk Assessment Cervical / Trunk Assessment: Normal   Communication Communication Communication: No difficulties   Cognition Arousal/Alertness: Awake/alert Behavior During Therapy: WFL for tasks assessed/performed Overall Cognitive Status: Within Functional Limits for tasks assessed                                       General Comments  HR 80s-90s    Exercises     Shoulder Instructions       Home Living Family/patient expects to be discharged to:: Other (Comment) (Independent Living ([facility]))     Type of Home: Apartment Home Access: Level entry           Bathroom Shower/Tub: Occupational psychologist: Handicapped height     Home Equipment: Grab bars - tub/shower;Grab bars - toilet;Rollator (4 wheels)          Prior Functioning/Environment Prior Level of Function : Needs assist;Driving             Mobility Comments: no use of AD in apartment, uses Rollator outside of apartment to walk to dining hall, etc ADLs Comments: Mod I with ADLs, can make simple meals in apartment, still drives to bank, stores, etc        OT Problem List: Decreased strength;Decreased activity tolerance;Impaired balance (sitting and/or standing);Cardiopulmonary status limiting activity      OT Treatment/Interventions: Self-care/ADL training;Therapeutic exercise;Energy conservation;DME and/or AE instruction;Therapeutic activities;Patient/family education    OT Goals(Current goals can be found in the care plan section) Acute Rehab OT Goals Patient Stated Goal: get back to routine OT Goal Formulation: With patient/family Time For Goal Achievement: 02/28/22 Potential to Achieve Goals: Good  OT Frequency: Min 2X/week    Co-evaluation              AM-PAC OT "6 Clicks" Daily Activity  Outcome Measure Help from another person eating meals?: None Help from another person taking care of personal grooming?: A Little Help from another person toileting, which includes using toliet, bedpan, or urinal?: A Little Help from another person bathing (including washing, rinsing, drying)?: A Little Help from another person to put on and taking off regular upper body clothing?: None Help from another person to put on and taking off regular lower body clothing?: A Little 6 Click Score: 20   End of Session Equipment Utilized During Treatment: Gait belt Nurse  Communication: Mobility status  Activity Tolerance: Patient tolerated treatment well Patient left: in bed;with call bell/phone within reach;with family/visitor present  OT Visit Diagnosis: Unsteadiness on feet (R26.81)                Time: 9355-2174 OT Time Calculation (min): 21 min Charges:  OT General Charges $OT Visit: 1 Visit OT Evaluation $OT Eval Low Complexity: 1 Low  Malachy Chamber, OTR/L Acute Rehab Services Office: [phone]   Layla Maw 02/14/2022, 8:20 AM

## 2022-02-14 NOTE — Evaluation (Signed)
Physical Therapy Evaluation Patient Details Name: Amber Park MRN: [id number] DOB: 05/31/1925 Today's Date: 02/14/2022  History of Present Illness  88 yo female [facility] for substernal chest pain. Pt underwent L heart cath on 7/26. PMH includes GERD, skin cancer, anxiety, colon cancer s/p colectomy, HTN, glaucoma, L THA 2015.  Clinical Impression  Pt presents to PT with mild deficits in strength and balance, but is able to ambulate without physical assistance currently. Pt is eager to go home, PT encourages frequent mobility within the home in an effort to improve gait quality and activity tolerance. PT recommends discharge home when medically ready, no post-acute PT recommended.     Recommendations for follow up therapy are one component of a multi-disciplinary discharge planning process, led by the attending physician.  Recommendations may be updated based on patient status, additional functional criteria and insurance authorization.  Follow Up Recommendations No PT follow up      Assistance Recommended at Discharge PRN  Patient can return home with the following  A little help with bathing/dressing/bathroom;Assistance with cooking/housework;Help with stairs or ramp for entrance    Equipment Recommendations None recommended by PT  Recommendations for Other Services       Functional Status Assessment Patient has had a recent decline in their functional status and demonstrates the ability to make significant improvements in function in a reasonable and predictable amount of time.     Precautions / Restrictions Precautions Precautions: Fall Precaution Comments: post-radial cath precautions Restrictions Weight Bearing Restrictions: No      Mobility  Bed Mobility Overal bed mobility: Needs Assistance Bed Mobility: Rolling, Sidelying to Sit, Sit to Supine Rolling: Supervision Sidelying to sit: Supervision   Sit to supine:  Supervision   General bed mobility comments: cues to avoid pushing through R wrist/hand    Transfers Overall transfer level: Needs assistance Equipment used: None   Sit to Stand: Supervision                Ambulation/Gait Ambulation/Gait assistance: Supervision Gait Distance (Feet): 150 Feet Assistive device:  (PRN railing) Gait Pattern/deviations: Step-through pattern Gait velocity: reduced Gait velocity interpretation: <1.8 ft/sec, indicate of risk for recurrent falls   General Gait Details: one lateral- loss of balance with right head turn during conversation, pt corrects with stepping strategy  Stairs            Wheelchair Mobility    Modified Rankin (Stroke Patients Only)       Balance Overall balance assessment: Needs assistance Sitting-balance support: No upper extremity supported, Feet supported Sitting balance-Leahy Scale: Good     Standing balance support: No upper extremity supported, During functional activity Standing balance-Leahy Scale: Fair                               Pertinent Vitals/Pain Pain Assessment Pain Assessment: No/denies pain    Home Living Family/patient expects to be discharged to:: Other (Comment) ([facility])     Type of Home: Apartment Home Access: Level entry         Home Equipment: Grab bars - tub/shower;Grab bars - toilet;Rollator (4 wheels)      Prior Function Prior Level of Function : Independent/Modified Independent;Driving             Mobility Comments: ambulates without device within apartment, utilizes a rollator to go to dining hall and out in the community ADLs Comments:  Mod I with ADLs, can make simple meals in apartment, still drives to bank, stores, etc     Hand Dominance   Dominant Hand: Right    Extremity/Trunk Assessment   Upper Extremity Assessment Upper Extremity Assessment: Overall WFL for tasks assessed;RUE deficits/detail RUE Deficits / Details:  limitations in RUE use due to recent radial cath    Lower Extremity Assessment Lower Extremity Assessment: Generalized weakness    Cervical / Trunk Assessment Cervical / Trunk Assessment: Normal  Communication   Communication: No difficulties  Cognition Arousal/Alertness: Awake/alert Behavior During Therapy: WFL for tasks assessed/performed Overall Cognitive Status: Within Functional Limits for tasks assessed                                          General Comments General comments (skin integrity, edema, etc.): HR in 80s throughout session    Exercises     Assessment/Plan    PT Assessment Patient needs continued PT services  PT Problem List Decreased strength;Decreased balance;Decreased activity tolerance;Decreased mobility       PT Treatment Interventions DME instruction;Gait training;Functional mobility training;Therapeutic activities;Therapeutic exercise;Balance training;Neuromuscular re-education;Patient/family education    PT Goals (Current goals can be found in the Care Plan section)  Acute Rehab PT Goals Patient Stated Goal: to go home PT Goal Formulation: With patient/family Time For Goal Achievement: 02/28/22 Potential to Achieve Goals: Good    Frequency Min 3X/week     Co-evaluation               AM-PAC PT "6 Clicks" Mobility  Outcome Measure Help needed turning from your back to your side while in a flat bed without using bedrails?: A Little Help needed moving from lying on your back to sitting on the side of a flat bed without using bedrails?: A Little Help needed moving to and from a bed to a chair (including a wheelchair)?: A Little Help needed standing up from a chair using your arms (e.g., wheelchair or bedside chair)?: A Little Help needed to walk in hospital room?: A Little Help needed climbing 3-5 steps with a railing? : A Little 6 Click Score: 18    End of Session   Activity Tolerance: Patient tolerated treatment  well Patient left: in bed;with call bell/phone within reach;with family/visitor present Nurse Communication: Mobility status PT Visit Diagnosis: Other abnormalities of gait and mobility (R26.89);Muscle weakness (generalized) (M62.81)    Time: 9480-1655 PT Time Calculation (min) (ACUTE ONLY): 18 min   Charges:   PT Evaluation $PT Eval Low Complexity: [place], PT, DPT Acute Rehabilitation Office [phone]   Zenaida Niece 02/14/2022, 9:53 AM

## 2022-02-14 NOTE — Progress Notes (Addendum)
PROGRESS NOTE    CHAKITA MCGRAW  VFI:[id number] DOB: 01/26/1925 DOA: 02/12/2022 PCP: Lajean Manes, MD    Brief Narrative:   JALIZA SEIFRIED is a 88 y.o. female with past medical history significant for GERD, colon cancer status post resection in remission, HTN, glaucoma, history of AVMs, history of iron deficiency anemia in the past, hiatal hernia hospital with chest pain with radiation to the abdomen and back.  EMS was called and patient received aspirin 324 mg and 2 sublingual nitro with improvement in her chest pain. Patient stated chest pain, back pain before dinner but with food made it worse.  Had a stress test done 20 years back.  Patient was then considered for admission to the hospital for further work-up and treatment.  Assessment and Plan:  Principal Problem:   NSTEMI (non-ST elevated myocardial infarction) ([place]) Active Problems:   GERD   Hiatal hernia   Colon cancer, ascending (HCC)   Hypokalemia   HTN (hypertension)   Hypomagnesemia   LFT elevation   Chest pain/elevated troponin Troponin was elevated up to 90.  EKG showed left bundle branch block unchanged prior to before.  Cardiology was consulted and was initially on IV heparin drip. Cardiology was consulted and since there was some regional wall motion abnormality on the echocardiogram.  Patient underwent  left heart catheterization with no significant coronary artery disease.  Continue metoprolol, aspirin.  Statin on hold due to elevated LFT.  Lipid panel showing total cholesterol of 251 with LDL of 160.   Epigastric pain, elevated LFTs, elevated lipase.  Concern for acute pancreatitis but CT scan did not show any acute findings.  MRCP was performed which showed some sludge versus stones in the distal CBD with no gallstones.  Communicated with GI.  Plan is to repeat LFTs in am and decide from there.  History of colon cancer Patient has been followed by Dr Learta Codding as outpatient.   GERD On  Protonix twice a day    Hiatal hernia Continue Protonix   HTN (hypertension) Continue Norvasc and metoprolol.  Hold HCTZ at this time.     Hypokalemia Improved after replacement.  Latest potassium of 4.4   Hypomagnesemia Improved after replacement.  Latest magnesium of 1.8     DVT prophylaxis: heparin injection 5,000 Units Start: 02/13/22 2200    Code Status:     Code Status: Full Code  Disposition: Home.  PT recommended no skilled therapy needs.  Status is: Observation  The patient will require care spanning > 2 midnights and should be moved to inpatient because: Further GI evaluation   Family Communication:  Communicated with the patient's family at bedside   Consultants:  Cardiology GI  Procedures:  MRCP  Antimicrobials:  None  Anti-infectives (From admission, onward)    None      Subjective: Today, patient was seen and examined at bedside.  Wishes to go home.  Denies any nausea vomiting fever chills or rigor.  Objective: Vitals:   02/13/22 1655 02/13/22 2028 02/14/22 0452 02/14/22 0800  BP: (!) 149/62 (!) 150/56 (!) 166/69 (!) 145/60  Pulse: 80 80 73 80  Resp: '18 19 16 18  '$ Temp:  (!) 97.4 F (36.3 C) 97.6 F (36.4 C) 97.6 F (36.4 C)  TempSrc:  Oral Oral Oral  SpO2: 92% 94% 95% 96%  Weight:      Height:        Intake/Output Summary (Last 24 hours) at 02/14/2022 1641 Last data filed at 02/14/2022 0326 Gross per  24 hour  Intake 876.96 ml  Output --  Net 876.96 ml   Filed Weights   02/12/22 2328 02/13/22 0427 02/13/22 1052  Weight: 67.1 kg 66.4 kg 66.4 kg    Physical Examination: Body mass index is 27.66 kg/m.   General:  Average built, not in obvious distress HENT:   No scleral pallor or icterus noted. Oral mucosa is moist.  Chest:  Clear breath sounds.  Diminished breath sounds bilaterally. No crackles or wheezes.  CVS: S1 &S2 heard. No murmur.  Regular rate and rhythm. Abdomen: Soft, nontender, nondistended.   bowel sounds are heard.   Extremities: No  cyanosis, clubbing or edema.  Peripheral pulses are palpable. Psych: Alert, awake and oriented, normal mood CNS:  No cranial nerve deficits.  Power equal in all extremities.   Skin: Warm and dry.  No rashes noted.  Data Reviewed:   CBC: Recent Labs  Lab 02/12/22 2009 02/13/22 0300 02/13/22 0510 02/14/22 0153  WBC 9.8 12.8* 9.5 8.2  HGB 14.4 15.3* 15.2* 13.4  HCT 41.3 43.8 42.8 39.0  MCV 81.5 82.5 81.4 83.3  PLT 295 294 281 284    Basic Metabolic Panel: Recent Labs  Lab 02/12/22 2009 02/12/22 2211 02/12/22 2333 02/13/22 0510 02/14/22 0153  NA 136  --   --  137 133*  K 2.8*  --   --  3.0* 4.4  CL 101  --   --  100 103  CO2 25  --   --  27 23  GLUCOSE 139*  --   --  111* 103*  BUN 21  --   --  15 12  CREATININE 1.38*  --   --  1.19* 1.05*  CALCIUM 9.3  --   --  9.0 8.4*  MG  --  1.5* 1.5* 2.2 1.8  PHOS  --   --  3.7 3.1  --     Liver Function Tests: Recent Labs  Lab 02/12/22 2009 02/13/22 0510 02/14/22 0153  AST 33 183* 138*  ALT 17 110* 106*  ALKPHOS 97 102 93  BILITOT 0.4 1.2 1.0  PROT 6.6 6.7 5.7*  ALBUMIN 3.8 3.7 3.1*     Radiology Studies: MR ABDOMEN MRCP WO CONTRAST  Result Date: 02/14/2022 CLINICAL DATA:  88 year old female with elevated lipase. Epigastric pain. EXAM: MRI ABDOMEN WITHOUT CONTRAST  (INCLUDING MRCP) TECHNIQUE: Multiplanar multisequence MR imaging of the abdomen was performed. Heavily T2-weighted images of the biliary and pancreatic ducts were obtained, and three-dimensional MRCP images were rendered by post processing. COMPARISON:  None Available. FINDINGS: Lower chest:  Lung bases are clear. Hepatobiliary: No intrahepatic biliary duct dilatation. No gallstones are evident within the lumen the gallbladder. The gallbladder is distended but within normal limits measuring 3.7 cm in diameter. No pericholecystic fluid identified. Common bile duct is upper limits of normal at 6 mm. Small filling defect within the distal common bile duct within 1  cm the ampulla is present on several series (images 18/series 9; image 21/series 3; image 70/series 2). Pancreas: No pancreatic duct dilatation. No peripancreatic inflammation in the retroperitoneal fat. No fluid collections. Normal pancreatic parenchyma density T1 weighted imaging (series 8). No ductal variation (series 3) Spleen: Normal spleen. Adrenals/urinary tract: Adrenal glands and kidneys are normal. Stomach/Bowel: Large sliding-type hiatal hernia with 2/3 of stomach above the hemidiaphragms posterior to the LEFT heart. Limited view the bowel unremarkable. Vascular/Lymphatic: Abdominal aortic normal caliber. No retroperitoneal periportal lymphadenopathy. Musculoskeletal: No aggressive osseous lesion IMPRESSION: 1. No evidence acute pancreatitis.  No variant pancreatic ductal anatomy. 2. Small stones versus debris within distal common bile duct. No biliary duct dilatation. 3. No cholelithiasis identified.  No evidence acute cholecystitis. 4. Large sliding-type hiatal hernia. Electronically Signed   By: Suzy Bouchard M.D.   On: 02/14/2022 14:13   MR 3D Recon At Scanner  Result Date: 02/14/2022 CLINICAL DATA:  88 year old female with elevated lipase. Epigastric pain. EXAM: MRI ABDOMEN WITHOUT CONTRAST  (INCLUDING MRCP) TECHNIQUE: Multiplanar multisequence MR imaging of the abdomen was performed. Heavily T2-weighted images of the biliary and pancreatic ducts were obtained, and three-dimensional MRCP images were rendered by post processing. COMPARISON:  None Available. FINDINGS: Lower chest:  Lung bases are clear. Hepatobiliary: No intrahepatic biliary duct dilatation. No gallstones are evident within the lumen the gallbladder. The gallbladder is distended but within normal limits measuring 3.7 cm in diameter. No pericholecystic fluid identified. Common bile duct is upper limits of normal at 6 mm. Small filling defect within the distal common bile duct within 1 cm the ampulla is present on several series  (images 18/series 9; image 21/series 3; image 70/series 2). Pancreas: No pancreatic duct dilatation. No peripancreatic inflammation in the retroperitoneal fat. No fluid collections. Normal pancreatic parenchyma density T1 weighted imaging (series 8). No ductal variation (series 3) Spleen: Normal spleen. Adrenals/urinary tract: Adrenal glands and kidneys are normal. Stomach/Bowel: Large sliding-type hiatal hernia with 2/3 of stomach above the hemidiaphragms posterior to the LEFT heart. Limited view the bowel unremarkable. Vascular/Lymphatic: Abdominal aortic normal caliber. No retroperitoneal periportal lymphadenopathy. Musculoskeletal: No aggressive osseous lesion IMPRESSION: 1. No evidence acute pancreatitis. No variant pancreatic ductal anatomy. 2. Small stones versus debris within distal common bile duct. No biliary duct dilatation. 3. No cholelithiasis identified.  No evidence acute cholecystitis. 4. Large sliding-type hiatal hernia. Electronically Signed   By: Suzy Bouchard M.D.   On: 02/14/2022 14:13   CARDIAC CATHETERIZATION  Result Date: 02/14/2022   Mid LAD to Dist LAD lesion is 20% stenosed with 20% stenosed side branch in 2nd Diag.   Dist LAD lesion is 50% stenosed.  Essentially the vessel tapers distally after the major second diagonal branch.   Ost RCA to Prox RCA lesion is 30% stenosed.  (Mild catheter related spasm)   Prox RCA lesion is 20% stenosed.   -----------------------------------------------------------------   The left ventricular systolic function is normal by recent echocardiogram.   LV end diastolic pressure is mildly elevated.   There is no aortic valve stenosis. Impression: Angiographically trivial CAD no more than 20% lesions in major vessels.  Distal tapering vessels fade out near the apex but no culprit lesion. Hemodynamics: LVEDP 18 mmHg.  Recommendations: Consider noncardiac etiology for chest pain, although cannot exclude microvascular disease/spasm, but no catheterization  evidence to suggest that Zephyr band removal per protocol. DC IV heparin Stable from a Cardiac Cath standpoint for discharge in the morning Glenetta Hew, MD  ECHOCARDIOGRAM COMPLETE  Result Date: 02/13/2022    ECHOCARDIOGRAM REPORT   Patient Name:   PURVI RUEHL Date of Exam: 02/13/2022 Medical Rec #:  [id number]      Height:       61.0 in Accession #:    [id number]     Weight:       146.4 lb Date of Birth:  26-Jun-1925      BSA:          1.654 m Patient Age:    60 years       BP:  154/65 mmHg Patient Gender: F              HR:           81 bpm. Exam Location:  Inpatient Procedure: 2D Echo, Cardiac Doppler and Color Doppler Indications:    Elevated troponin  History:        Patient has no prior history of Echocardiogram examinations.                 Risk Factors:Hypertension.  Sonographer:    Jefferey Pica Referring Phys: [place]  1. Left ventricular ejection fraction, by estimation, is 55 to 60%. The left ventricle has normal function. The left ventricle has no regional wall motion abnormalities. There is mild left ventricular hypertrophy. Left ventricular diastolic parameters are indeterminate.  2. Right ventricular systolic function is normal. The right ventricular size is normal. There is normal pulmonary artery systolic pressure. The estimated right ventricular systolic pressure is 37.1 mmHg.  3. The mitral valve is normal in structure. Trivial mitral valve regurgitation. No evidence of mitral stenosis.  4. The aortic valve is tricuspid. There is mild calcification of the aortic valve. Aortic valve regurgitation is trivial. No aortic stenosis is present.  5. The inferior vena cava is normal in size with greater than 50% respiratory variability, suggesting right atrial pressure of 3 mmHg. FINDINGS  Left Ventricle: Left ventricular ejection fraction, by estimation, is 55 to 60%. The left ventricle has normal function. The left ventricle has no regional wall motion  abnormalities. The left ventricular internal cavity size was normal in size. There is  mild left ventricular hypertrophy. Left ventricular diastolic parameters are indeterminate. Right Ventricle: The right ventricular size is normal. No increase in right ventricular wall thickness. Right ventricular systolic function is normal. There is normal pulmonary artery systolic pressure. The tricuspid regurgitant velocity is 2.79 m/s, and  with an assumed right atrial pressure of 3 mmHg, the estimated right ventricular systolic pressure is 06.2 mmHg. Left Atrium: Left atrial size was normal in size. Right Atrium: Right atrial size was normal in size. Pericardium: There is no evidence of pericardial effusion. Mitral Valve: The mitral valve is normal in structure. Trivial mitral valve regurgitation. No evidence of mitral valve stenosis. Tricuspid Valve: The tricuspid valve is normal in structure. Tricuspid valve regurgitation is mild . No evidence of tricuspid stenosis. Aortic Valve: The aortic valve is tricuspid. There is mild calcification of the aortic valve. Aortic valve regurgitation is trivial. No aortic stenosis is present. Aortic valve peak gradient measures 9.7 mmHg. Pulmonic Valve: The pulmonic valve was normal in structure. Pulmonic valve regurgitation is not visualized. No evidence of pulmonic stenosis. Aorta: The aortic root is normal in size and structure. Venous: The inferior vena cava is normal in size with greater than 50% respiratory variability, suggesting right atrial pressure of 3 mmHg. IAS/Shunts: No atrial level shunt detected by color flow Doppler.  LEFT VENTRICLE PLAX 2D LVIDd:         3.50 cm   Diastology LVIDs:         2.70 cm   LV e' medial:    7.51 cm/s LV PW:         1.15 cm   LV E/e' medial:  12.2 LV IVS:        1.15 cm   LV e' lateral:   6.76 cm/s LVOT diam:     2.00 cm   LV E/e' lateral: 13.6 LV SV:  61 LV SV Index:   37 LVOT Area:     3.14 cm  RIGHT VENTRICLE             IVC RV S  prime:     14.20 cm/s  IVC diam: 1.20 cm TAPSE (M-mode): 2.2 cm LEFT ATRIUM             Index        RIGHT ATRIUM           Index LA diam:        3.25 cm 1.96 cm/m   RA Area:     13.70 cm LA Vol (A2C):   28.7 ml 17.35 ml/m  RA Volume:   32.60 ml  19.71 ml/m LA Vol (A4C):   33.7 ml 20.37 ml/m LA Biplane Vol: 31.2 ml 18.86 ml/m  AORTIC VALVE                 PULMONIC VALVE AV Area (Vmax): 1.87 cm     PV Vmax:       1.11 m/s AV Vmax:        156.00 cm/s  PV Peak grad:  4.9 mmHg AV Peak Grad:   9.7 mmHg LVOT Vmax:      93.10 cm/s LVOT Vmean:     55.700 cm/s LVOT VTI:       0.195 m  AORTA Ao Root diam: 2.95 cm Ao Asc diam:  3.30 cm MITRAL VALVE                TRICUSPID VALVE MV Area (PHT): 8.25 cm     TR Peak grad:   31.1 mmHg MV Decel Time: 92 msec      TR Vmax:        279.00 cm/s MV E velocity: 91.80 cm/s MV A velocity: 146.00 cm/s  SHUNTS MV E/A ratio:  0.63         Systemic VTI:  0.20 m                             Systemic Diam: 2.00 cm Cherlynn Kaiser MD Electronically signed by Cherlynn Kaiser MD Signature Date/Time: 02/13/2022/12:49:36 PM    Final    US ABDOMEN LIMITED RUQ (LIVER/GB)  Result Date: 02/13/2022 CLINICAL DATA:  88 year old female with abnormal lipase. Epigastric pain. EXAM: ULTRASOUND ABDOMEN LIMITED RIGHT UPPER QUADRANT COMPARISON:  CTA chest abdomen and pelvis 02/12/2022. FINDINGS: Gallbladder: Gallbladder appears mildly distended similar to the CTA yesterday, with normal wall thickness. However, there is some dependent echogenic material suspicious for small gallstones in addition to sludge (image 30), approximately 8 mm. No pericholecystic fluid. No sonographic Murphy sign elicited. Common bile duct: Diameter: 5 mm, normal. Liver: Echogenic liver with probable fatty sparing at the gallbladder fossa (image 66). No other discrete liver lesion. No intrahepatic biliary ductal dilatation. Portal vein is patent on color Doppler imaging with normal direction of blood flow towards the liver.  Other: Negative visible right kidney. IMPRESSION: 1. Evidence of sludge and small gallstones, but no strong evidence of acute cholecystitis. 2. No evidence of bile duct obstruction. 3. Hepatic steatosis. Electronically Signed   By: Genevie Ann M.D.   On: 02/13/2022 11:23   CT Angio Chest/Abd/Pel for Dissection W and/or Wo Contrast  Result Date: 02/12/2022 CLINICAL DATA:  Chest or back pain with aortic dissection suspected. Substernal chest pain this afternoon radiating to the abdomen. EXAM: CT ANGIOGRAPHY CHEST, ABDOMEN AND PELVIS TECHNIQUE: Non-contrast CT  of the chest was initially obtained. Multidetector CT imaging through the chest, abdomen and pelvis was performed using the standard protocol during bolus administration of intravenous contrast. Multiplanar reconstructed images and MIPs were obtained and reviewed to evaluate the vascular anatomy. RADIATION DOSE REDUCTION: This exam was performed according to the departmental dose-optimization program which includes automated exposure control, adjustment of the mA and/or kV according to patient size and/or use of iterative reconstruction technique. CONTRAST:  10m OMNIPAQUE IOHEXOL 350 MG/ML SOLN COMPARISON:  02/05/2021 FINDINGS: CTA CHEST FINDINGS Cardiovascular: Unenhanced images of the chest demonstrate calcification of the aorta and coronary arteries. No evidence of intramural hematoma. Images obtained during arterial phase after intravenous contrast material administration demonstrate normal caliber thoracic aorta. No aortic dissection. Great vessel origins are patent. Normal heart size. No pericardial effusions. Central pulmonary arteries are patent without evidence of significant pulmonary embolus. Mediastinum/Nodes: Thyroid gland is unremarkable. Large esophageal hiatal hernia. Esophagus is decompressed. No significant lymphadenopathy. Lungs/Pleura: Mild atelectasis in the lungs. Focal subpleural opacity in the right upper lung is unchanged since prior  study. Tiny pulmonary nodules are unchanged. Musculoskeletal: Degenerative changes.  No acute bony abnormalities. Review of the MIP images confirms the above findings. CTA ABDOMEN AND PELVIS FINDINGS VASCULAR Aorta: Diffuse aortic calcification. Normal caliber abdominal aorta. No dissection. Celiac: Focal calcific stenosis of the origin of the celiac axis, representing about 50% diameter reduction. Prompt reconstitution. SMA: The superior mesenteric artery is small in caliber but appears patent. Focal proximal stenosis with about 40% diameter reduction. Renals: Renal arteries are patent bilaterally. No aneurysm or occlusion. Nephrograms are symmetrical. IMA: Inferior mesenteric artery is patent. Inflow: Patent without evidence of aneurysm, dissection, vasculitis or significant stenosis. Veins: No obvious venous abnormality within the limitations of this arterial phase study. Review of the MIP images confirms the above findings. NON-VASCULAR Hepatobiliary: No focal liver abnormality is seen. No gallstones, gallbladder wall thickening, or biliary dilatation. Pancreas: Unremarkable. No pancreatic ductal dilatation or surrounding inflammatory changes. Spleen: Normal in size without focal abnormality. Adrenals/Urinary Tract: Adrenal glands are unremarkable. Kidneys are normal, without renal calculi, focal lesion, or hydronephrosis. Bladder is unremarkable. Stomach/Bowel: Stomach, small bowel, and colon are not abnormally distended. Partial right hemicolectomy with ileocolonic anastomosis. No wall thickening or inflammatory changes. Lymphatic: Nonspecific lymphadenopathy in the mesentery with mild mesenteric stranding. Largest lymph nodes measure up to about 8 mm short axis dimension. Appearance is nonspecific. Changes are more prominent than on the prior study. Possibly inflammatory or reactive change but early metastatic disease could potentially have this appearance. Reproductive: Status post hysterectomy. No adnexal  masses. Other: No abdominal wall hernia or abnormality. No abdominopelvic ascites. Musculoskeletal: Degenerative changes. No acute bony abnormalities. Postoperative change in the left hip. Review of the MIP images confirms the above findings. IMPRESSION: 1. Aortic atherosclerosis. No evidence of aortic aneurysm or dissection. 2. No evidence of metastatic disease in the chest. 3. Postoperative right hemicolectomy with patent ileocolonic anastomosis. 4. Borderline prominence of mesenteric lymph nodes with mesenteric stranding, increased since prior study. Appearances are nonspecific. 5. Large esophageal hiatal hernia. 6. 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 Electronically Signed   By: WLucienne CapersM.D.   On: 02/12/2022 22:45   DG Chest Port 1 View  Result Date: 02/12/2022 CLINICAL DATA:  Chest pain EXAM: PORTABLE CHEST 1 VIEW COMPARISON:  07/24/2020 FINDINGS: Lungs are clear.  No pleural effusion or pneumothorax. The heart is normal in size.  Thoracic aortic atherosclerosis. Large hiatal hernia. IMPRESSION:  No evidence of acute cardiopulmonary disease. Electronically Signed   By: Julian Hy M.D.   On: 02/12/2022 21:00      LOS: 0 days    Flora Lipps, MD [facility] Available via Epic secure chat 7am-7pm After these hours, please refer to coverage provider listed on [email or web address] 02/14/2022, 4:41 PM

## 2022-02-15 ENCOUNTER — Encounter (HOSPITAL_COMMUNITY): Payer: Self-pay | Admitting: Internal Medicine

## 2022-02-15 DIAGNOSIS — R932 Abnormal findings on diagnostic imaging of liver and biliary tract: Secondary | ICD-10-CM

## 2022-02-15 DIAGNOSIS — I10 Essential (primary) hypertension: Secondary | ICD-10-CM | POA: Diagnosis not present

## 2022-02-15 DIAGNOSIS — E876 Hypokalemia: Secondary | ICD-10-CM | POA: Diagnosis not present

## 2022-02-15 DIAGNOSIS — K219 Gastro-esophageal reflux disease without esophagitis: Secondary | ICD-10-CM | POA: Diagnosis not present

## 2022-02-15 DIAGNOSIS — K851 Biliary acute pancreatitis without necrosis or infection: Secondary | ICD-10-CM

## 2022-02-15 DIAGNOSIS — C182 Malignant neoplasm of ascending colon: Secondary | ICD-10-CM | POA: Diagnosis not present

## 2022-02-15 DIAGNOSIS — R7989 Other specified abnormal findings of blood chemistry: Secondary | ICD-10-CM | POA: Diagnosis not present

## 2022-02-15 DIAGNOSIS — I214 Non-ST elevation (NSTEMI) myocardial infarction: Secondary | ICD-10-CM | POA: Diagnosis not present

## 2022-02-15 DIAGNOSIS — K8050 Calculus of bile duct without cholangitis or cholecystitis without obstruction: Secondary | ICD-10-CM | POA: Diagnosis not present

## 2022-02-15 LAB — COMPREHENSIVE METABOLIC PANEL
ALT: 66 U/L — ABNORMAL HIGH (ref 0–44)
AST: 73 U/L — ABNORMAL HIGH (ref 15–41)
Albumin: 3.1 g/dL — ABNORMAL LOW (ref 3.5–5.0)
Alkaline Phosphatase: 81 U/L (ref 38–126)
Anion gap: 7 (ref 5–15)
BUN: 14 mg/dL (ref 8–23)
CO2: 25 mmol/L (ref 22–32)
Calcium: 9 mg/dL (ref 8.9–10.3)
Chloride: 104 mmol/L (ref 98–111)
Creatinine, Ser: 1.2 mg/dL — ABNORMAL HIGH (ref 0.44–1.00)
GFR, Estimated: 42 mL/min — ABNORMAL LOW (ref >60)
Glucose, Bld: 90 mg/dL (ref 70–99)
Potassium: 4.4 mmol/L (ref 3.5–5.1)
Sodium: 136 mmol/L (ref 135–145)
Total Bilirubin: 1 mg/dL (ref 0.3–1.2)
Total Protein: 5.9 g/dL — ABNORMAL LOW (ref 6.5–8.1)

## 2022-02-15 LAB — CBC
HCT: 39.1 % (ref 36.0–46.0)
Hemoglobin: 13.2 g/dL (ref 12.0–15.0)
MCH: 28.8 pg (ref 26.0–34.0)
MCHC: 33.8 g/dL (ref 30.0–36.0)
MCV: 85.2 fL (ref 80.0–100.0)
Platelets: 235 10*3/uL (ref 150–400)
RBC: 4.59 MIL/uL (ref 3.87–5.11)
RDW: 13.1 % (ref 11.5–15.5)
WBC: 7.4 10*3/uL (ref 4.0–10.5)
nRBC: 0 % (ref 0.0–0.2)

## 2022-02-15 LAB — LIPOPROTEIN A (LPA): Lipoprotein (a): 36.6 nmol/L — ABNORMAL HIGH (ref <75.0)

## 2022-02-15 LAB — LIPASE, BLOOD: Lipase: 38 U/L (ref 11–51)

## 2022-02-15 NOTE — Progress Notes (Signed)
[facility] Progress Note  CC:  Chest pain/epigastric abdominal pain; elevated lipase and LFTs  Subjective:  Feels fine.  No abdominal pain.  LFTs down some today.  She is agreeable to ERCP tomorrow AM with Dr. Rush Landmark.  Objective:  Vital signs in last 24 hours: Temp:  [97.6 F (36.4 C)-98.8 F (37.1 C)] 97.8 F (36.6 C) (07/28 0354) Pulse Rate:  [72-82] 72 (07/28 0354) Resp:  [16-18] 16 (07/28 0354) BP: (141-160)/(62-85) 154/62 (07/28 0354) SpO2:  [92 %-93 %] 93 % (07/28 0354) Last BM Date : 02/14/22 General:  Alert, Well-developed, in NAD Heart:  Regular rate and rhythm; no murmurs Pulm:  CTAB.  No W/R/R. Abdomen:  Soft, non-distended.  BS present.  Non-tender. Extremities:  Without edema. Neurologic:  Alert and oriented x 4;  grossly normal neurologically. Psych:  Alert and cooperative. Normal mood and affect.  Intake/Output from previous day: 07/27 0701 - 07/28 0700 In: 240 [P.O.:240] Out: -   Lab Results: Recent Labs    02/13/22 0510 02/14/22 0153 02/15/22 0155  WBC 9.5 8.2 7.4  HGB 15.2* 13.4 13.2  HCT 42.8 39.0 39.1  PLT 281 239 235   BMET Recent Labs    02/13/22 0510 02/14/22 0153 02/15/22 0155  NA 137 133* 136  K 3.0* 4.4 4.4  CL 100 103 104  CO2 '27 23 25  '$ GLUCOSE 111* 103* 90  BUN '15 12 14  '$ CREATININE 1.19* 1.05* 1.20*  CALCIUM 9.0 8.4* 9.0   LFT Recent Labs    02/12/22 2009 02/13/22 0510 02/15/22 0155  PROT 6.6   < > 5.9*  ALBUMIN 3.8   < > 3.1*  AST 33   < > 73*  ALT 17   < > 66*  ALKPHOS 97   < > 81  BILITOT 0.4   < > 1.0  BILIDIR <0.1  --   --   IBILI NOT CALCULATED  --   --    < > = values in this interval not displayed.   MR ABDOMEN MRCP WO CONTRAST  Result Date: 02/14/2022 CLINICAL DATA:  88 year old female with elevated lipase. Epigastric pain. EXAM: MRI ABDOMEN WITHOUT CONTRAST  (INCLUDING MRCP) TECHNIQUE: Multiplanar multisequence MR imaging of the abdomen was performed. Heavily T2-weighted images of  the biliary and pancreatic ducts were obtained, and three-dimensional MRCP images were rendered by post processing. COMPARISON:  None Available. FINDINGS: Lower chest:  Lung bases are clear. Hepatobiliary: No intrahepatic biliary duct dilatation. No gallstones are evident within the lumen the gallbladder. The gallbladder is distended but within normal limits measuring 3.7 cm in diameter. No pericholecystic fluid identified. Common bile duct is upper limits of normal at 6 mm. Small filling defect within the distal common bile duct within 1 cm the ampulla is present on several series (images 18/series 9; image 21/series 3; image 70/series 2). Pancreas: No pancreatic duct dilatation. No peripancreatic inflammation in the retroperitoneal fat. No fluid collections. Normal pancreatic parenchyma density T1 weighted imaging (series 8). No ductal variation (series 3) Spleen: Normal spleen. Adrenals/urinary tract: Adrenal glands and kidneys are normal. Stomach/Bowel: Large sliding-type hiatal hernia with 2/3 of stomach above the hemidiaphragms posterior to the LEFT heart. Limited view the bowel unremarkable. Vascular/Lymphatic: Abdominal aortic normal caliber. No retroperitoneal periportal lymphadenopathy. Musculoskeletal: No aggressive osseous lesion IMPRESSION: 1. No evidence acute pancreatitis. No variant pancreatic ductal anatomy. 2. Small stones versus debris within distal common bile duct. No biliary duct dilatation. 3. No cholelithiasis identified.  No evidence acute  cholecystitis. 4. Large sliding-type hiatal hernia. Electronically Signed   By: Suzy Bouchard M.D.   On: 02/14/2022 14:13   MR 3D Recon At Scanner  Result Date: 02/14/2022 CLINICAL DATA:  88 year old female with elevated lipase. Epigastric pain. EXAM: MRI ABDOMEN WITHOUT CONTRAST  (INCLUDING MRCP) TECHNIQUE: Multiplanar multisequence MR imaging of the abdomen was performed. Heavily T2-weighted images of the biliary and pancreatic ducts were  obtained, and three-dimensional MRCP images were rendered by post processing. COMPARISON:  None Available. FINDINGS: Lower chest:  Lung bases are clear. Hepatobiliary: No intrahepatic biliary duct dilatation. No gallstones are evident within the lumen the gallbladder. The gallbladder is distended but within normal limits measuring 3.7 cm in diameter. No pericholecystic fluid identified. Common bile duct is upper limits of normal at 6 mm. Small filling defect within the distal common bile duct within 1 cm the ampulla is present on several series (images 18/series 9; image 21/series 3; image 70/series 2). Pancreas: No pancreatic duct dilatation. No peripancreatic inflammation in the retroperitoneal fat. No fluid collections. Normal pancreatic parenchyma density T1 weighted imaging (series 8). No ductal variation (series 3) Spleen: Normal spleen. Adrenals/urinary tract: Adrenal glands and kidneys are normal. Stomach/Bowel: Large sliding-type hiatal hernia with 2/3 of stomach above the hemidiaphragms posterior to the LEFT heart. Limited view the bowel unremarkable. Vascular/Lymphatic: Abdominal aortic normal caliber. No retroperitoneal periportal lymphadenopathy. Musculoskeletal: No aggressive osseous lesion IMPRESSION: 1. No evidence acute pancreatitis. No variant pancreatic ductal anatomy. 2. Small stones versus debris within distal common bile duct. No biliary duct dilatation. 3. No cholelithiasis identified.  No evidence acute cholecystitis. 4. Large sliding-type hiatal hernia. Electronically Signed   By: Suzy Bouchard M.D.   On: 02/14/2022 14:13   CARDIAC CATHETERIZATION  Result Date: 02/14/2022   Mid LAD to Dist LAD lesion is 20% stenosed with 20% stenosed side branch in 2nd Diag.   Dist LAD lesion is 50% stenosed.  Essentially the vessel tapers distally after the major second diagonal branch.   Ost RCA to Prox RCA lesion is 30% stenosed.  (Mild catheter related spasm)   Prox RCA lesion is 20% stenosed.    -----------------------------------------------------------------   The left ventricular systolic function is normal by recent echocardiogram.   LV end diastolic pressure is mildly elevated.   There is no aortic valve stenosis. Impression: Angiographically trivial CAD no more than 20% lesions in major vessels.  Distal tapering vessels fade out near the apex but no culprit lesion. Hemodynamics: LVEDP 18 mmHg.  Recommendations: Consider noncardiac etiology for chest pain, although cannot exclude microvascular disease/spasm, but no catheterization evidence to suggest that Zephyr band removal per protocol. DC IV heparin Stable from a Cardiac Cath standpoint for discharge in the morning Glenetta Hew, MD  US ABDOMEN LIMITED RUQ (LIVER/GB)  Result Date: 02/13/2022 CLINICAL DATA:  88 year old female with abnormal lipase. Epigastric pain. EXAM: ULTRASOUND ABDOMEN LIMITED RIGHT UPPER QUADRANT COMPARISON:  CTA chest abdomen and pelvis 02/12/2022. FINDINGS: Gallbladder: Gallbladder appears mildly distended similar to the CTA yesterday, with normal wall thickness. However, there is some dependent echogenic material suspicious for small gallstones in addition to sludge (image 30), approximately 8 mm. No pericholecystic fluid. No sonographic Murphy sign elicited. Common bile duct: Diameter: 5 mm, normal. Liver: Echogenic liver with probable fatty sparing at the gallbladder fossa (image 66). No other discrete liver lesion. No intrahepatic biliary ductal dilatation. Portal vein is patent on color Doppler imaging with normal direction of blood flow towards the liver. Other: Negative visible right kidney. IMPRESSION: 1. Evidence  of sludge and small gallstones, but no strong evidence of acute cholecystitis. 2. No evidence of bile duct obstruction. 3. Hepatic steatosis. Electronically Signed   By: Genevie Ann M.D.   On: 02/13/2022 11:23    Assessment / Plan: Acute onset upper abdominal/epigastric pain radiating to back and chest.   Her troponins are mildly elevated.  Inferior wall motion abnormality on the echocardiogram so suspect she has had some kind of acute cardiac event.  Cardiac cath ok with no definite cardiac source identified, however.  In discussing symptoms with pt and given elevated lipase/transaminases, there may be a biliary component to her symptoms.  However only uncomplicated gallstones/sludge disease without biliary ductal dilatation seen on imaging.  Fortunately the pancreas looks fine on CT. ? did she pass some sludge or stone?  Lipase was 667, but normal today.  AST and ALT still elevated just very slightly but much improved/close to normal and total bili/ALP remain normal.  No further pain since admission.  MRCP 7/27 shows small stone vs debris in a nondilated distal CBD.   Colon cancer resected with left hemicolectomy/ileocolonic anastomosis in 01/2020.  Cancer free at 4 years.  Current CT shows no evidence of metastatic disease.  Just completed follow-up office visit with oncology a week ago.  ? Is she candidate for polyp/cancer screening at age 88, missed follow-up appt w him on 7/23.  Dr Ammie Dalton indicates no need for surveillnce imaging or colonoscopy.   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  Not anemic by current Hgb. Treated w parenteral iron and PRBCs in past. Last parenteral iron was 07/2020.  Last PRBCs in 01/2020.    GERD/large hiatal hernia:  On pantoprazole 40 mg BID, which she will continue.  -She is agreeable to ERCP tomorrow AM, tentatively at 730 AM, with Dr. Rush Landmark.  NPO after midnight. -Trend labs.   LOS: 0 days   Laban Emperor. Demario Faniel  02/15/2022, 9:09 AM

## 2022-02-15 NOTE — H&P (View-Only) (Signed)
[facility] Progress Note  CC:  Chest pain/epigastric abdominal pain; elevated lipase and LFTs  Subjective:  Feels fine.  No abdominal pain.  LFTs down some today.  She is agreeable to ERCP tomorrow AM with Dr. Rush Landmark.  Objective:  Vital signs in last 24 hours: Temp:  [97.6 F (36.4 C)-98.8 F (37.1 C)] 97.8 F (36.6 C) (07/28 0354) Pulse Rate:  [72-82] 72 (07/28 0354) Resp:  [16-18] 16 (07/28 0354) BP: (141-160)/(62-85) 154/62 (07/28 0354) SpO2:  [92 %-93 %] 93 % (07/28 0354) Last BM Date : 02/14/22 General:  Alert, Well-developed, in NAD Heart:  Regular rate and rhythm; no murmurs Pulm:  CTAB.  No W/R/R. Abdomen:  Soft, non-distended.  BS present.  Non-tender. Extremities:  Without edema. Neurologic:  Alert and oriented x 4;  grossly normal neurologically. Psych:  Alert and cooperative. Normal mood and affect.  Intake/Output from previous day: 07/27 0701 - 07/28 0700 In: 240 [P.O.:240] Out: -   Lab Results: Recent Labs    02/13/22 0510 02/14/22 0153 02/15/22 0155  WBC 9.5 8.2 7.4  HGB 15.2* 13.4 13.2  HCT 42.8 39.0 39.1  PLT 281 239 235   BMET Recent Labs    02/13/22 0510 02/14/22 0153 02/15/22 0155  NA 137 133* 136  K 3.0* 4.4 4.4  CL 100 103 104  CO2 '27 23 25  '$ GLUCOSE 111* 103* 90  BUN '15 12 14  '$ CREATININE 1.19* 1.05* 1.20*  CALCIUM 9.0 8.4* 9.0   LFT Recent Labs    02/12/22 2009 02/13/22 0510 02/15/22 0155  PROT 6.6   < > 5.9*  ALBUMIN 3.8   < > 3.1*  AST 33   < > 73*  ALT 17   < > 66*  ALKPHOS 97   < > 81  BILITOT 0.4   < > 1.0  BILIDIR <0.1  --   --   IBILI NOT CALCULATED  --   --    < > = values in this interval not displayed.   MR ABDOMEN MRCP WO CONTRAST  Result Date: 02/14/2022 CLINICAL DATA:  88 year old female with elevated lipase. Epigastric pain. EXAM: MRI ABDOMEN WITHOUT CONTRAST  (INCLUDING MRCP) TECHNIQUE: Multiplanar multisequence MR imaging of the abdomen was performed. Heavily T2-weighted images of  the biliary and pancreatic ducts were obtained, and three-dimensional MRCP images were rendered by post processing. COMPARISON:  None Available. FINDINGS: Lower chest:  Lung bases are clear. Hepatobiliary: No intrahepatic biliary duct dilatation. No gallstones are evident within the lumen the gallbladder. The gallbladder is distended but within normal limits measuring 3.7 cm in diameter. No pericholecystic fluid identified. Common bile duct is upper limits of normal at 6 mm. Small filling defect within the distal common bile duct within 1 cm the ampulla is present on several series (images 18/series 9; image 21/series 3; image 70/series 2). Pancreas: No pancreatic duct dilatation. No peripancreatic inflammation in the retroperitoneal fat. No fluid collections. Normal pancreatic parenchyma density T1 weighted imaging (series 8). No ductal variation (series 3) Spleen: Normal spleen. Adrenals/urinary tract: Adrenal glands and kidneys are normal. Stomach/Bowel: Large sliding-type hiatal hernia with 2/3 of stomach above the hemidiaphragms posterior to the LEFT heart. Limited view the bowel unremarkable. Vascular/Lymphatic: Abdominal aortic normal caliber. No retroperitoneal periportal lymphadenopathy. Musculoskeletal: No aggressive osseous lesion IMPRESSION: 1. No evidence acute pancreatitis. No variant pancreatic ductal anatomy. 2. Small stones versus debris within distal common bile duct. No biliary duct dilatation. 3. No cholelithiasis identified.  No evidence acute  cholecystitis. 4. Large sliding-type hiatal hernia. Electronically Signed   By: Suzy Bouchard M.D.   On: 02/14/2022 14:13   MR 3D Recon At Scanner  Result Date: 02/14/2022 CLINICAL DATA:  88 year old female with elevated lipase. Epigastric pain. EXAM: MRI ABDOMEN WITHOUT CONTRAST  (INCLUDING MRCP) TECHNIQUE: Multiplanar multisequence MR imaging of the abdomen was performed. Heavily T2-weighted images of the biliary and pancreatic ducts were  obtained, and three-dimensional MRCP images were rendered by post processing. COMPARISON:  None Available. FINDINGS: Lower chest:  Lung bases are clear. Hepatobiliary: No intrahepatic biliary duct dilatation. No gallstones are evident within the lumen the gallbladder. The gallbladder is distended but within normal limits measuring 3.7 cm in diameter. No pericholecystic fluid identified. Common bile duct is upper limits of normal at 6 mm. Small filling defect within the distal common bile duct within 1 cm the ampulla is present on several series (images 18/series 9; image 21/series 3; image 70/series 2). Pancreas: No pancreatic duct dilatation. No peripancreatic inflammation in the retroperitoneal fat. No fluid collections. Normal pancreatic parenchyma density T1 weighted imaging (series 8). No ductal variation (series 3) Spleen: Normal spleen. Adrenals/urinary tract: Adrenal glands and kidneys are normal. Stomach/Bowel: Large sliding-type hiatal hernia with 2/3 of stomach above the hemidiaphragms posterior to the LEFT heart. Limited view the bowel unremarkable. Vascular/Lymphatic: Abdominal aortic normal caliber. No retroperitoneal periportal lymphadenopathy. Musculoskeletal: No aggressive osseous lesion IMPRESSION: 1. No evidence acute pancreatitis. No variant pancreatic ductal anatomy. 2. Small stones versus debris within distal common bile duct. No biliary duct dilatation. 3. No cholelithiasis identified.  No evidence acute cholecystitis. 4. Large sliding-type hiatal hernia. Electronically Signed   By: Suzy Bouchard M.D.   On: 02/14/2022 14:13   CARDIAC CATHETERIZATION  Result Date: 02/14/2022   Mid LAD to Dist LAD lesion is 20% stenosed with 20% stenosed side branch in 2nd Diag.   Dist LAD lesion is 50% stenosed.  Essentially the vessel tapers distally after the major second diagonal branch.   Ost RCA to Prox RCA lesion is 30% stenosed.  (Mild catheter related spasm)   Prox RCA lesion is 20% stenosed.    -----------------------------------------------------------------   The left ventricular systolic function is normal by recent echocardiogram.   LV end diastolic pressure is mildly elevated.   There is no aortic valve stenosis. Impression: Angiographically trivial CAD no more than 20% lesions in major vessels.  Distal tapering vessels fade out near the apex but no culprit lesion. Hemodynamics: LVEDP 18 mmHg.  Recommendations: Consider noncardiac etiology for chest pain, although cannot exclude microvascular disease/spasm, but no catheterization evidence to suggest that Zephyr band removal per protocol. DC IV heparin Stable from a Cardiac Cath standpoint for discharge in the morning Glenetta Hew, MD  US ABDOMEN LIMITED RUQ (LIVER/GB)  Result Date: 02/13/2022 CLINICAL DATA:  88 year old female with abnormal lipase. Epigastric pain. EXAM: ULTRASOUND ABDOMEN LIMITED RIGHT UPPER QUADRANT COMPARISON:  CTA chest abdomen and pelvis 02/12/2022. FINDINGS: Gallbladder: Gallbladder appears mildly distended similar to the CTA yesterday, with normal wall thickness. However, there is some dependent echogenic material suspicious for small gallstones in addition to sludge (image 30), approximately 8 mm. No pericholecystic fluid. No sonographic Murphy sign elicited. Common bile duct: Diameter: 5 mm, normal. Liver: Echogenic liver with probable fatty sparing at the gallbladder fossa (image 66). No other discrete liver lesion. No intrahepatic biliary ductal dilatation. Portal vein is patent on color Doppler imaging with normal direction of blood flow towards the liver. Other: Negative visible right kidney. IMPRESSION: 1. Evidence  of sludge and small gallstones, but no strong evidence of acute cholecystitis. 2. No evidence of bile duct obstruction. 3. Hepatic steatosis. Electronically Signed   By: Genevie Ann M.D.   On: 02/13/2022 11:23    Assessment / Plan: Acute onset upper abdominal/epigastric pain radiating to back and chest.   Her troponins are mildly elevated.  Inferior wall motion abnormality on the echocardiogram so suspect she has had some kind of acute cardiac event.  Cardiac cath ok with no definite cardiac source identified, however.  In discussing symptoms with pt and given elevated lipase/transaminases, there may be a biliary component to her symptoms.  However only uncomplicated gallstones/sludge disease without biliary ductal dilatation seen on imaging.  Fortunately the pancreas looks fine on CT. ? did she pass some sludge or stone?  Lipase was 667, but normal today.  AST and ALT still elevated just very slightly but much improved/close to normal and total bili/ALP remain normal.  No further pain since admission.  MRCP 7/27 shows small stone vs debris in a nondilated distal CBD.   Colon cancer resected with left hemicolectomy/ileocolonic anastomosis in 01/2020.  Cancer free at 4 years.  Current CT shows no evidence of metastatic disease.  Just completed follow-up office visit with oncology a week ago.  ? Is she candidate for polyp/cancer screening at age 26, missed follow-up appt w him on 7/23.  Dr Ammie Dalton indicates no need for surveillnce imaging or colonoscopy.   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  Not anemic by current Hgb. Treated w parenteral iron and PRBCs in past. Last parenteral iron was 07/2020.  Last PRBCs in 01/2020.    GERD/large hiatal hernia:  On pantoprazole 40 mg BID, which she will continue.  -She is agreeable to ERCP tomorrow AM, tentatively at 730 AM, with Dr. Rush Landmark.  NPO after midnight. -Trend labs.   LOS: 0 days   Laban Emperor. Kennis Buell  02/15/2022, 9:09 AM

## 2022-02-15 NOTE — Progress Notes (Signed)
PROGRESS NOTE    Amber Park  EXN:[id number] DOB: August 12, 1924 DOA: 02/12/2022 PCP: Lajean Manes, MD    Brief Narrative:   Amber Park is a 88 y.o. female with past medical history significant for GERD, colon cancer status post resection in remission, HTN, glaucoma, history of AVMs, history of iron deficiency anemia in the past, hiatal hernia hospital with chest pain with radiation to the abdomen and back.  EMS was called and patient received aspirin 324 mg and 2 sublingual nitro with improvement in her chest pain. Patient stated chest pain, back pain before dinner but with food made it worse.  Had a stress test done 20 years back.  Patient was then considered for admission to the hospital for further work-up and treatment.  During hospitalization, patient was noted to have elevated lipase and LFT and MRCP was done which showed stone versus debris's in the bile duct.  GI on board and plan for ERCP 02/16/2022.  Assessment and Plan:  Principal Problem:   NSTEMI (non-ST elevated myocardial infarction) ([place]) Active Problems:   GERD   Hiatal hernia   Colon cancer, ascending (HCC)   Hypokalemia   HTN (hypertension)   Hypomagnesemia   LFT elevation  Chest pain/elevated troponin Troponin was elevated up to 90.  EKG showed left bundle branch block unchanged prior to before.  Cardiology was consulted and was initially on IV heparin drip. Cardiology was consulted and since there was some regional wall motion abnormality on the echocardiogram.  Patient underwent  left heart catheterization with no significant coronary artery disease.  Continue metoprolol, aspirin.  Statin on hold due to elevated LFT.  Lipid panel showing total cholesterol of 251 with LDL of 160.   Epigastric pain, elevated LFTs, elevated lipase.  Concern for acute pancreatitis but CT scan did not show any acute findings.  MRCP was performed which showed some sludge versus stones in the distal CBD with no gallstones.  LFTs  trending down.  GI on board and plan for potential ERCP in AM.  History of colon cancer Patient has been followed by Dr Learta Codding as outpatient.   GERD/hiatal hernia On  Protonix twice a day   HTN (hypertension) Continue Norvasc and metoprolol.  Hold HCTZ at this time.     Hypokalemia Improved after replacement.  Latest potassium of 4.4   Hypomagnesemia Improved after replacement.  Latest magnesium of 1.8     DVT prophylaxis: heparin injection 5,000 Units Start: 02/13/22 2200    Code Status:     Code Status: Full Code  Disposition: Home.  PT recommended no skilled therapy needs.  Status is: Observation  The patient will require care spanning > 2 midnights and should be moved to inpatient because: Further GI evaluation, ERCP likely 02/16/2022   Family Communication:  Communicated with the patient's daughter at bedside.  Consultants:  Cardiology GI  Procedures:  MRCP  Antimicrobials:  None  Anti-infectives (From admission, onward)    None      Subjective: Today, patient was seen and examined at bedside denies any nausea, vomiting, fever chills or rigor.  Patient's daughter at bedside.   Objective: Vitals:   02/14/22 1645 02/14/22 1925 02/15/22 0354 02/15/22 1222  BP: (!) 141/85 (!) 160/67 (!) 154/62 (!) 146/54  Pulse: 82 80 72 68  Resp: '18 18 16 17  '$ Temp: 97.6 F (36.4 C) 98.8 F (37.1 C) 97.8 F (36.6 C) (!) 97.5 F (36.4 C)  TempSrc: Oral Oral Oral Oral  SpO2: 92% 93% 93%  97%  Weight:      Height:        Intake/Output Summary (Last 24 hours) at 02/15/2022 1342 Last data filed at 02/14/2022 2156 Gross per 24 hour  Intake 240 ml  Output --  Net 240 ml    Filed Weights   02/12/22 2328 02/13/22 0427 02/13/22 1052  Weight: 67.1 kg 66.4 kg 66.4 kg    Physical Examination: Body mass index is 27.66 kg/m.   General:  Average built, not in obvious distress HENT:   No scleral pallor or icterus noted. Oral mucosa is moist.  Chest:  Clear breath  sounds.  Diminished breath sounds bilaterally. No crackles or wheezes.  CVS: S1 &S2 heard. No murmur.  Regular rate and rhythm. Abdomen: Soft, mild tenderness on palpation., nondistended.  Bowel sounds are heard.   Extremities: No cyanosis, clubbing or edema.  Peripheral pulses are palpable. Psych: Alert, awake and oriented, normal mood CNS:  No cranial nerve deficits.  Power equal in all extremities.   Skin: Warm and dry.  No rashes noted.   Data Reviewed:   CBC: Recent Labs  Lab 02/12/22 2009 02/13/22 0300 02/13/22 0510 02/14/22 0153 02/15/22 0155  WBC 9.8 12.8* 9.5 8.2 7.4  HGB 14.4 15.3* 15.2* 13.4 13.2  HCT 41.3 43.8 42.8 39.0 39.1  MCV 81.5 82.5 81.4 83.3 85.2  PLT 295 294 281 239 235     Basic Metabolic Panel: Recent Labs  Lab 02/12/22 2009 02/12/22 2211 02/12/22 2333 02/13/22 0510 02/14/22 0153 02/15/22 0155  NA 136  --   --  137 133* 136  K 2.8*  --   --  3.0* 4.4 4.4  CL 101  --   --  100 103 104  CO2 25  --   --  '27 23 25  '$ GLUCOSE 139*  --   --  111* 103* 90  BUN 21  --   --  '15 12 14  '$ CREATININE 1.38*  --   --  1.19* 1.05* 1.20*  CALCIUM 9.3  --   --  9.0 8.4* 9.0  MG  --  1.5* 1.5* 2.2 1.8  --   PHOS  --   --  3.7 3.1  --   --      Liver Function Tests: Recent Labs  Lab 02/12/22 2009 02/13/22 0510 02/14/22 0153 02/15/22 0155  AST 33 183* 138* 73*  ALT 17 110* 106* 66*  ALKPHOS 97 102 93 81  BILITOT 0.4 1.2 1.0 1.0  PROT 6.6 6.7 5.7* 5.9*  ALBUMIN 3.8 3.7 3.1* 3.1*    Radiology Studies: MR ABDOMEN MRCP WO CONTRAST  Result Date: 02/14/2022 CLINICAL DATA:  88 year old female with elevated lipase. Epigastric pain. EXAM: MRI ABDOMEN WITHOUT CONTRAST  (INCLUDING MRCP) TECHNIQUE: Multiplanar multisequence MR imaging of the abdomen was performed. Heavily T2-weighted images of the biliary and pancreatic ducts were obtained, and three-dimensional MRCP images were rendered by post processing. COMPARISON:  None Available. FINDINGS: Lower chest:   Lung bases are clear. Hepatobiliary: No intrahepatic biliary duct dilatation. No gallstones are evident within the lumen the gallbladder. The gallbladder is distended but within normal limits measuring 3.7 cm in diameter. No pericholecystic fluid identified. Common bile duct is upper limits of normal at 6 mm. Small filling defect within the distal common bile duct within 1 cm the ampulla is present on several series (images 18/series 9; image 21/series 3; image 70/series 2). Pancreas: No pancreatic duct dilatation. No peripancreatic inflammation in the retroperitoneal fat. No fluid  collections. Normal pancreatic parenchyma density T1 weighted imaging (series 8). No ductal variation (series 3) Spleen: Normal spleen. Adrenals/urinary tract: Adrenal glands and kidneys are normal. Stomach/Bowel: Large sliding-type hiatal hernia with 2/3 of stomach above the hemidiaphragms posterior to the LEFT heart. Limited view the bowel unremarkable. Vascular/Lymphatic: Abdominal aortic normal caliber. No retroperitoneal periportal lymphadenopathy. Musculoskeletal: No aggressive osseous lesion IMPRESSION: 1. No evidence acute pancreatitis. No variant pancreatic ductal anatomy. 2. Small stones versus debris within distal common bile duct. No biliary duct dilatation. 3. No cholelithiasis identified.  No evidence acute cholecystitis. 4. Large sliding-type hiatal hernia. Electronically Signed   By: Suzy Bouchard M.D.   On: 02/14/2022 14:13   MR 3D Recon At Scanner  Result Date: 02/14/2022 CLINICAL DATA:  88 year old female with elevated lipase. Epigastric pain. EXAM: MRI ABDOMEN WITHOUT CONTRAST  (INCLUDING MRCP) TECHNIQUE: Multiplanar multisequence MR imaging of the abdomen was performed. Heavily T2-weighted images of the biliary and pancreatic ducts were obtained, and three-dimensional MRCP images were rendered by post processing. COMPARISON:  None Available. FINDINGS: Lower chest:  Lung bases are clear. Hepatobiliary: No  intrahepatic biliary duct dilatation. No gallstones are evident within the lumen the gallbladder. The gallbladder is distended but within normal limits measuring 3.7 cm in diameter. No pericholecystic fluid identified. Common bile duct is upper limits of normal at 6 mm. Small filling defect within the distal common bile duct within 1 cm the ampulla is present on several series (images 18/series 9; image 21/series 3; image 70/series 2). Pancreas: No pancreatic duct dilatation. No peripancreatic inflammation in the retroperitoneal fat. No fluid collections. Normal pancreatic parenchyma density T1 weighted imaging (series 8). No ductal variation (series 3) Spleen: Normal spleen. Adrenals/urinary tract: Adrenal glands and kidneys are normal. Stomach/Bowel: Large sliding-type hiatal hernia with 2/3 of stomach above the hemidiaphragms posterior to the LEFT heart. Limited view the bowel unremarkable. Vascular/Lymphatic: Abdominal aortic normal caliber. No retroperitoneal periportal lymphadenopathy. Musculoskeletal: No aggressive osseous lesion IMPRESSION: 1. No evidence acute pancreatitis. No variant pancreatic ductal anatomy. 2. Small stones versus debris within distal common bile duct. No biliary duct dilatation. 3. No cholelithiasis identified.  No evidence acute cholecystitis. 4. Large sliding-type hiatal hernia. Electronically Signed   By: Suzy Bouchard M.D.   On: 02/14/2022 14:13   CARDIAC CATHETERIZATION  Result Date: 02/14/2022   Mid LAD to Dist LAD lesion is 20% stenosed with 20% stenosed side branch in 2nd Diag.   Dist LAD lesion is 50% stenosed.  Essentially the vessel tapers distally after the major second diagonal branch.   Ost RCA to Prox RCA lesion is 30% stenosed.  (Mild catheter related spasm)   Prox RCA lesion is 20% stenosed.   -----------------------------------------------------------------   The left ventricular systolic function is normal by recent echocardiogram.   LV end diastolic pressure  is mildly elevated.   There is no aortic valve stenosis. Impression: Angiographically trivial CAD no more than 20% lesions in major vessels.  Distal tapering vessels fade out near the apex but no culprit lesion. Hemodynamics: LVEDP 18 mmHg.  Recommendations: Consider noncardiac etiology for chest pain, although cannot exclude microvascular disease/spasm, but no catheterization evidence to suggest that Zephyr band removal per protocol. DC IV heparin Stable from a Cardiac Cath standpoint for discharge in the morning Glenetta Hew, MD     LOS: 0 days    Flora Lipps, MD [facility] Available via Epic secure chat 7am-7pm After these hours, please refer to coverage provider listed on [email or web address] 02/15/2022, 1:42 PM

## 2022-02-15 NOTE — Progress Notes (Signed)
Physical Therapy Treatment Patient Details Name: Amber Park MRN: [id number] DOB: 07-Jan-1925 Today's Date: 02/15/2022   History of Present Illness 88 yo female [facility] for substernal chest pain. Pt underwent L heart cath on 7/26. PMH includes GERD, skin cancer, anxiety, colon cancer s/p colectomy, HTN, glaucoma, L THA 2015.    PT Comments    Pt tolerates treatment well, ambulating independently with static ambulation, only demonstrating one loss of balance with lateral head turns, which she reports is a chronic issue. Pt tolerates changes in gait speed, abrupt stops and turns, backward walking, all without significant balance deviations. Pt is at her mobility baseline and has no further acute PT needs. PT encourages multiple bouts of ambulation daily in an effort to maintain the pt's current level of function. Acute PT signing off.  Recommendations for follow up therapy are one component of a multi-disciplinary discharge planning process, led by the attending physician.  Recommendations may be updated based on patient status, additional functional criteria and insurance authorization.  Follow Up Recommendations  No PT follow up     Assistance Recommended at Discharge None  Patient can return home with the following Assist for transportation   Equipment Recommendations  None recommended by PT    Recommendations for Other Services       Precautions / Restrictions Precautions Precautions: Fall Precaution Comments: post-radial cath precautions Restrictions Weight Bearing Restrictions: No     Mobility  Bed Mobility Overal bed mobility: Independent                  Transfers Overall transfer level: Independent                      Ambulation/Gait Ambulation/Gait assistance: Supervision Gait Distance (Feet): 400 Feet Assistive device: None Gait Pattern/deviations: Step-through pattern Gait velocity:  functional Gait velocity interpretation: >2.62 ft/sec, indicative of community ambulatory   General Gait Details: pt with one lateral loss of balance with lateral head turns, able to correct with stepping strategy. Pt reports chronic balance deviations with lateral head turns   Stairs             Wheelchair Mobility    Modified Rankin (Stroke Patients Only)       Balance Overall balance assessment: Needs assistance Sitting-balance support: No upper extremity supported, Feet supported Sitting balance-Leahy Scale: Good     Standing balance support: No upper extremity supported, During functional activity Standing balance-Leahy Scale: Good                              Cognition Arousal/Alertness: Awake/alert Behavior During Therapy: WFL for tasks assessed/performed Overall Cognitive Status: Within Functional Limits for tasks assessed                                          Exercises      General Comments General comments (skin integrity, edema, etc.): VSS on RA      Pertinent Vitals/Pain Pain Assessment Pain Assessment: No/denies pain    Home Living                          Prior Function            PT Goals (current goals can now be found  in the care plan section) Acute Rehab PT Goals Patient Stated Goal: to go home Progress towards PT goals: Goals met/education completed, patient discharged from PT    Frequency     (D/C PT services)      PT Plan Current plan remains appropriate    Co-evaluation              AM-PAC PT "6 Clicks" Mobility   Outcome Measure  Help needed turning from your back to your side while in a flat bed without using bedrails?: None Help needed moving from lying on your back to sitting on the side of a flat bed without using bedrails?: None Help needed moving to and from a bed to a chair (including a wheelchair)?: None Help needed standing up from a chair using your arms  (e.g., wheelchair or bedside chair)?: None Help needed to walk in hospital room?: A Little Help needed climbing 3-5 steps with a railing? : A Little 6 Click Score: 22    End of Session   Activity Tolerance: Patient tolerated treatment well Patient left: in bed;with call bell/phone within reach;with family/visitor present Nurse Communication: Mobility status PT Visit Diagnosis: Other abnormalities of gait and mobility (R26.89);Muscle weakness (generalized) (M62.81)     Time: 0300-9233 PT Time Calculation (min) (ACUTE ONLY): 13 min  Charges:  $Gait Training: 8-22 mins                     Zenaida Niece, PT, DPT Acute Rehabilitation Office [place] 02/15/2022, 2:50 PM

## 2022-02-16 ENCOUNTER — Encounter (HOSPITAL_COMMUNITY)
Admission: EM | Disposition: A | Discharge status: Home / Self Care | Payer: Self-pay | Source: Home / Self Care | Attending: Emergency Medicine

## 2022-02-16 ENCOUNTER — Encounter (HOSPITAL_COMMUNITY): Payer: Self-pay | Admitting: Internal Medicine

## 2022-02-16 ENCOUNTER — Observation Stay (HOSPITAL_BASED_OUTPATIENT_CLINIC_OR_DEPARTMENT_OTHER): Payer: Medicare Other | Admitting: Certified Registered Nurse Anesthetist

## 2022-02-16 ENCOUNTER — Observation Stay (HOSPITAL_COMMUNITY): Payer: Medicare Other | Admitting: Certified Registered Nurse Anesthetist

## 2022-02-16 ENCOUNTER — Observation Stay (HOSPITAL_COMMUNITY): Payer: Medicare Other

## 2022-02-16 DIAGNOSIS — K3189 Other diseases of stomach and duodenum: Secondary | ICD-10-CM

## 2022-02-16 DIAGNOSIS — I2510 Atherosclerotic heart disease of native coronary artery without angina pectoris: Secondary | ICD-10-CM | POA: Diagnosis not present

## 2022-02-16 DIAGNOSIS — K259 Gastric ulcer, unspecified as acute or chronic, without hemorrhage or perforation: Secondary | ICD-10-CM | POA: Diagnosis not present

## 2022-02-16 DIAGNOSIS — C182 Malignant neoplasm of ascending colon: Secondary | ICD-10-CM | POA: Diagnosis not present

## 2022-02-16 DIAGNOSIS — K449 Diaphragmatic hernia without obstruction or gangrene: Secondary | ICD-10-CM | POA: Diagnosis not present

## 2022-02-16 DIAGNOSIS — K8050 Calculus of bile duct without cholangitis or cholecystitis without obstruction: Secondary | ICD-10-CM

## 2022-02-16 DIAGNOSIS — K219 Gastro-esophageal reflux disease without esophagitis: Secondary | ICD-10-CM | POA: Diagnosis not present

## 2022-02-16 DIAGNOSIS — K2950 Unspecified chronic gastritis without bleeding: Secondary | ICD-10-CM | POA: Diagnosis not present

## 2022-02-16 DIAGNOSIS — I10 Essential (primary) hypertension: Secondary | ICD-10-CM | POA: Diagnosis not present

## 2022-02-16 DIAGNOSIS — E876 Hypokalemia: Secondary | ICD-10-CM | POA: Diagnosis not present

## 2022-02-16 DIAGNOSIS — I214 Non-ST elevation (NSTEMI) myocardial infarction: Secondary | ICD-10-CM | POA: Diagnosis not present

## 2022-02-16 HISTORY — PX: SPHINCTEROTOMY: SHX5279

## 2022-02-16 HISTORY — PX: ERCP: SHX5425

## 2022-02-16 HISTORY — PX: BIOPSY: SHX5522

## 2022-02-16 HISTORY — PX: REMOVAL OF STONES: SHX5545

## 2022-02-16 LAB — CBC
HCT: 37.1 % (ref 36.0–46.0)
Hemoglobin: 13 g/dL (ref 12.0–15.0)
MCH: 28.9 pg (ref 26.0–34.0)
MCHC: 35 g/dL (ref 30.0–36.0)
MCV: 82.4 fL (ref 80.0–100.0)
Platelets: 233 10*3/uL (ref 150–400)
RBC: 4.5 MIL/uL (ref 3.87–5.11)
RDW: 12.9 % (ref 11.5–15.5)
WBC: 7 10*3/uL (ref 4.0–10.5)
nRBC: 0 % (ref 0.0–0.2)

## 2022-02-16 LAB — BASIC METABOLIC PANEL
Anion gap: 8 (ref 5–15)
BUN: 17 mg/dL (ref 8–23)
CO2: 25 mmol/L (ref 22–32)
Calcium: 9.2 mg/dL (ref 8.9–10.3)
Chloride: 105 mmol/L (ref 98–111)
Creatinine, Ser: 1.07 mg/dL — ABNORMAL HIGH (ref 0.44–1.00)
GFR, Estimated: 48 mL/min — ABNORMAL LOW (ref >60)
Glucose, Bld: 97 mg/dL (ref 70–99)
Potassium: 3.7 mmol/L (ref 3.5–5.1)
Sodium: 138 mmol/L (ref 135–145)

## 2022-02-16 LAB — MAGNESIUM: Magnesium: 1.8 mg/dL (ref 1.7–2.4)

## 2022-02-16 SURGERY — ERCP, WITH INTERVENTION IF INDICATED
Anesthesia: General

## 2022-02-16 MED ORDER — GLUCAGON HCL RDNA (DIAGNOSTIC) 1 MG IJ SOLR
INTRAMUSCULAR | Status: DC | PRN
  Administered 2022-02-16: .25 mg via INTRAVENOUS

## 2022-02-16 MED ORDER — SUGAMMADEX SODIUM 200 MG/2ML IV SOLN
INTRAVENOUS | Status: DC | PRN
  Administered 2022-02-16: 150 mg via INTRAVENOUS

## 2022-02-16 MED ORDER — INDOMETHACIN 50 MG RE SUPP
RECTAL | Status: AC
  Filled 2022-02-16: qty 2

## 2022-02-16 MED ORDER — PHENYLEPHRINE 80 MCG/ML (10ML) SYRINGE FOR IV PUSH (FOR BLOOD PRESSURE SUPPORT)
PREFILLED_SYRINGE | INTRAVENOUS | Status: DC | PRN
  Administered 2022-02-16: 80 ug via INTRAVENOUS

## 2022-02-16 MED ORDER — SODIUM CHLORIDE 0.9 % IV SOLN
INTRAVENOUS | Status: DC
Start: 2022-02-16 — End: 2022-02-16

## 2022-02-16 MED ORDER — CIPROFLOXACIN IN D5W 400 MG/200ML IV SOLN
INTRAVENOUS | Status: AC
  Filled 2022-02-16: qty 200

## 2022-02-16 MED ORDER — SUCRALFATE 1 GM/10ML PO SUSP
1.0000 g | Freq: Two times a day (BID) | ORAL | Status: DC
Start: 2022-02-16 — End: 2022-02-17
  Administered 2022-02-16 – 2022-02-17 (×3): 1 g via ORAL
  Filled 2022-02-16 (×3): qty 10

## 2022-02-16 MED ORDER — PROPOFOL 10 MG/ML IV BOLUS
INTRAVENOUS | Status: DC | PRN
  Administered 2022-02-16: 100 mg via INTRAVENOUS
  Administered 2022-02-16 (×2): 20 mg via INTRAVENOUS

## 2022-02-16 MED ORDER — LACTATED RINGERS IV SOLN
INTRAVENOUS | Status: AC | PRN
  Administered 2022-02-16: 10 mL/h via INTRAVENOUS

## 2022-02-16 MED ORDER — GLUCAGON HCL RDNA (DIAGNOSTIC) 1 MG IJ SOLR
INTRAMUSCULAR | Status: AC
  Filled 2022-02-16: qty 1

## 2022-02-16 MED ORDER — PROPOFOL 500 MG/50ML IV EMUL
INTRAVENOUS | Status: DC | PRN
  Administered 2022-02-16: 20 ug/kg/min via INTRAVENOUS

## 2022-02-16 MED ORDER — LIDOCAINE HCL (CARDIAC) PF 100 MG/5ML IV SOSY
PREFILLED_SYRINGE | INTRAVENOUS | Status: DC | PRN
  Administered 2022-02-16: 60 mg via INTRAVENOUS

## 2022-02-16 MED ORDER — FENTANYL CITRATE (PF) 100 MCG/2ML IJ SOLN
INTRAMUSCULAR | Status: AC
  Filled 2022-02-16: qty 2

## 2022-02-16 MED ORDER — DEXAMETHASONE SODIUM PHOSPHATE 10 MG/ML IJ SOLN
INTRAMUSCULAR | Status: DC | PRN
  Administered 2022-02-16: 10 mg via INTRAVENOUS

## 2022-02-16 MED ORDER — CIPROFLOXACIN IN D5W 400 MG/200ML IV SOLN
INTRAVENOUS | Status: DC | PRN
  Administered 2022-02-16: 400 mg via INTRAVENOUS

## 2022-02-16 MED ORDER — INDOMETHACIN 50 MG RE SUPP
RECTAL | Status: DC | PRN
  Administered 2022-02-16: 100 mg via RECTAL

## 2022-02-16 MED ORDER — SODIUM CHLORIDE 0.9 % IV SOLN
INTRAVENOUS | Status: DC | PRN
  Administered 2022-02-16: 30 mL

## 2022-02-16 MED ORDER — ROCURONIUM BROMIDE 10 MG/ML (PF) SYRINGE
PREFILLED_SYRINGE | INTRAVENOUS | Status: DC | PRN
  Administered 2022-02-16: 30 mg via INTRAVENOUS

## 2022-02-16 MED ORDER — ONDANSETRON HCL 4 MG/2ML IJ SOLN
INTRAMUSCULAR | Status: DC | PRN
  Administered 2022-02-16: 4 mg via INTRAVENOUS

## 2022-02-16 MED ORDER — HEPARIN SODIUM (PORCINE) 5000 UNIT/ML IJ SOLN: 5000.0000 [IU] | Freq: Three times a day (TID) | INTRAMUSCULAR | Status: DC

## 2022-02-16 MED ORDER — FENTANYL CITRATE (PF) 100 MCG/2ML IJ SOLN
INTRAMUSCULAR | Status: DC | PRN
  Administered 2022-02-16: 100 ug via INTRAVENOUS

## 2022-02-16 NOTE — Progress Notes (Addendum)
PROGRESS NOTE    Amber Park  TKZ:[id number] DOB: 08-16-1924 DOA: 02/12/2022 PCP: Lajean Manes, MD    Brief Narrative:   Amber Park is a 88 y.o. female with past medical history significant for GERD, colon cancer status post resection in remission, HTN, glaucoma, history of AVMs, history of iron deficiency anemia in the past, hiatal hernia hospital with chest pain with radiation to the abdomen and back.  EMS was called and patient received aspirin 324 mg and 2 sublingual nitro with improvement in her chest pain. Patient stated chest pain, back pain before dinner but with food made it worse.  Had a stress test done 20 years back.  Patient was then considered for admission to the hospital for further work-up and treatment.  During hospitalization, patient was noted to have elevated lipase and LFT and MRCP was done which showed stone versus debris's in the bile duct.  GI was consulted and patient underwent ERCP 02/16/2022 with removal of the stone..  Assessment and Plan:  Principal Problem:   NSTEMI (non-ST elevated myocardial infarction) ([place]) Active Problems:   GERD   Hiatal hernia   Colon cancer, ascending (HCC)   Hypokalemia   HTN (hypertension)   Hypomagnesemia   LFT elevation   Choledocholithiasis  Chest pain/elevated troponin Troponin was elevated up to 90.  EKG showed left bundle branch block unchanged prior to before.  Cardiology was consulted and was initially on IV heparin drip. Cardiology was consulted and since there was some regional wall motion abnormality on the echocardiogram.  Patient underwent  left heart catheterization with no significant coronary artery disease.  Continue metoprolol, aspirin.  Statin on hold due to elevated LFT.  Lipid panel showing total cholesterol of 251 with LDL of 160.   Epigastric pain, elevated LFTs, elevated lipase likely secondary to choledocholithiasis.  Concern for acute pancreatitis but CT scan did not show any acute findings.   MRCP was performed which showed some sludge versus stones in the distal CBD with no gallstones.  LFTs trending down.  GI on board and  underwent ERCP with stone removal on 02/16/2022  with biliary sphincterotomy and balloon trawl.  GI recommends to hold heparin prophylaxis for next 48 hours.  Okay to start aspirin 02/17/2022.  We will check LFT in the morning.  Diet has been advanced.  History of colon cancer Patient has been followed by Dr Learta Codding as outpatient.   GERD/hiatal hernia On  Protonix twice a day   HTN (hypertension) Continue Norvasc and metoprolol.  Hold HCTZ at this time.     Hypokalemia Improved after replacement.  Latest potassium of 3.7.   Hypomagnesemia Improved after replacement.  Latest magnesium of 1.8     DVT prophylaxis:   Hold heparin subcu in 48 hours  Code Status:     Code Status: Full Code  Disposition: Home.  PT recommended no skilled therapy needs.  Status is: Observation  The patient will require care spanning > 2 midnights and should be moved to inpatient because:, status post ERCP 02/16/2022   Family Communication:  Communicated with the patient's son at bedside.  Consultants:  Cardiology GI  Procedures:  MRCP ERCP with biliary sphincterotomy and balloon trawl  Antimicrobials:  None  Anti-infectives (From admission, onward)    None      Subjective: Today, patient was seen and examined at bedside.  Seen after ERCP.  Denies any nausea vomiting but feels a little sleepy after the medication given for procedure.  Patient's son at  bedside.  Objective: Vitals:   02/16/22 0910 02/16/22 0930 02/16/22 0950 02/16/22 1100  BP: (!) 163/65 (!) 156/56 (!) 145/95   Pulse: 70 66    Resp: '19 17 18   '$ Temp:  (!) 97.3 F (36.3 C) (!) 97.1 F (36.2 C)   TempSrc:   Oral Oral  SpO2: 94% 94% 96%   Weight:      Height:        Intake/Output Summary (Last 24 hours) at 02/16/2022 1544 Last data filed at 02/16/2022 0858 Gross per 24 hour  Intake  791.74 ml  Output --  Net 791.74 ml   Filed Weights   02/13/22 0427 02/13/22 1052 02/16/22 0708  Weight: 66.4 kg 66.4 kg 65.3 kg    Physical Examination: Body mass index is 27.21 kg/m.   General:  Average built, not in obvious distress HENT:   No scleral pallor or icterus noted. Oral mucosa is moist.  Chest:  Clear breath sounds.  Diminished breath sounds bilaterally. No crackles or wheezes.  CVS: S1 &S2 heard. No murmur.  Regular rate and rhythm. Abdomen: Soft, nontender, nondistended.  Bowel sounds are heard.   Extremities: No cyanosis, clubbing or edema.  Peripheral pulses are palpable. Psych: Alert, awake and mildly sleepy, normal mood CNS:  No cranial nerve deficits.  Power equal in all extremities.   Skin: Warm and dry.  No rashes noted.  Data Reviewed:   CBC: Recent Labs  Lab 02/13/22 0300 02/13/22 0510 02/14/22 0153 02/15/22 0155 02/16/22 0147  WBC 12.8* 9.5 8.2 7.4 7.0  HGB 15.3* 15.2* 13.4 13.2 13.0  HCT 43.8 42.8 39.0 39.1 37.1  MCV 82.5 81.4 83.3 85.2 82.4  PLT 294 281 239 235 374    Basic Metabolic Panel: Recent Labs  Lab 02/12/22 2009 02/12/22 2211 02/12/22 2333 02/13/22 0510 02/14/22 0153 02/15/22 0155 02/16/22 0147  NA 136  --   --  137 133* 136 138  K 2.8*  --   --  3.0* 4.4 4.4 3.7  CL 101  --   --  100 103 104 105  CO2 25  --   --  '27 23 25 25  '$ GLUCOSE 139*  --   --  111* 103* 90 97  BUN 21  --   --  '15 12 14 17  '$ CREATININE 1.38*  --   --  1.19* 1.05* 1.20* 1.07*  CALCIUM 9.3  --   --  9.0 8.4* 9.0 9.2  MG  --  1.5* 1.5* 2.2 1.8  --  1.8  PHOS  --   --  3.7 3.1  --   --   --     Liver Function Tests: Recent Labs  Lab 02/12/22 2009 02/13/22 0510 02/14/22 0153 02/15/22 0155  AST 33 183* 138* 73*  ALT 17 110* 106* 66*  ALKPHOS 97 102 93 81  BILITOT 0.4 1.2 1.0 1.0  PROT 6.6 6.7 5.7* 5.9*  ALBUMIN 3.8 3.7 3.1* 3.1*   Radiology Studies: DG ERCP  Result Date: 02/16/2022 CLINICAL DATA:  88 year old female with  choledocholithiasis EXAM: ERCP TECHNIQUE: Multiple spot images obtained with the fluoroscopic device and submitted for interpretation post-procedure. FLUOROSCOPY: Radiation Exposure Index (as provided by the fluoroscopic device): 19.6 mGy Kerma COMPARISON:  MR 02/14/2022 FINDINGS: Limited intraoperative fluoroscopic spot images during ERCP. Initial image demonstrates endoscope projecting over the upper abdomen. Subsequently there is cannulation of the ampulla with placement of a safety wire. Partial opacification of the extrahepatic biliary ducts. Deployment of a balloon  retrieval catheter. IMPRESSION: Limited images during ERCP demonstrates deployment of a balloon retrieval catheter, as above. Please refer to the dictated operative report for full details of intraoperative findings and procedure. Electronically Signed   By: Corrie Mckusick D.O.   On: 02/16/2022 09:07      LOS: 0 days    Flora Lipps, MD [facility] Available via Epic secure chat 7am-7pm After these hours, please refer to coverage provider listed on [email or web address] 02/16/2022, 3:44 PM

## 2022-02-16 NOTE — Anesthesia Procedure Notes (Signed)
Procedure Name: Intubation Date/Time: 02/16/2022 7:53 AM  Performed by: Colin Benton, CRNAPre-anesthesia Checklist: Patient identified, Emergency Drugs available, Suction available and Patient being monitored Patient Re-evaluated:Patient Re-evaluated prior to induction Oxygen Delivery Method: Circle system utilized Preoxygenation: Pre-oxygenation with 100% oxygen Induction Type: IV induction Ventilation: Mask ventilation without difficulty Laryngoscope Size: Mac and 3 Grade View: Grade II Tube type: Oral Tube size: 7.0 mm Number of attempts: 1 Airway Equipment and Method: Stylet Placement Confirmation: ETT inserted through vocal cords under direct vision, positive ETCO2 and breath sounds checked- equal and bilateral Secured at: 21 cm Tube secured with: Tape Dental Injury: Teeth and Oropharynx as per pre-operative assessment

## 2022-02-16 NOTE — Anesthesia Preprocedure Evaluation (Addendum)
Anesthesia Evaluation  Patient identified by MRN, date of birth, ID band Patient awake    Reviewed: Allergy & Precautions, NPO status , Patient's Chart, lab work & pertinent test results  History of Anesthesia Complications (+) PONV and history of anesthetic complications  Airway Mallampati: III  TM Distance: >3 FB Neck ROM: Full    Dental no notable dental hx.    Pulmonary neg pulmonary ROS, former smoker,    Pulmonary exam normal        Cardiovascular hypertension, Pt. on medications + CAD and + Past MI  Normal cardiovascular exam     Neuro/Psych Anxiety negative neurological ROS     GI/Hepatic Neg liver ROS, hiatal hernia, PUD, GERD  Medicated and Controlled,  Endo/Other  negative endocrine ROS  Renal/GU negative Renal ROS     Musculoskeletal  (+) Arthritis ,   Abdominal   Peds  Hematology negative hematology ROS (+)   Anesthesia Other Findings Choledocholithiasis Biliary pancreatitis  Reproductive/Obstetrics                            Anesthesia Physical Anesthesia Plan  ASA: 3  Anesthesia Plan: General   Post-op Pain Management:    Induction: Intravenous  PONV Risk Score and Plan: 3 and Ondansetron, Dexamethasone, Treatment may vary due to age or medical condition and Propofol infusion  Airway Management Planned: Oral ETT  Additional Equipment:   Intra-op Plan:   Post-operative Plan: Extubation in OR  Informed Consent: I have reviewed the patients History and Physical, chart, labs and discussed the procedure including the risks, benefits and alternatives for the proposed anesthesia with the patient or authorized representative who has indicated his/her understanding and acceptance.     Dental advisory given  Plan Discussed with: CRNA  Anesthesia Plan Comments:        Anesthesia Quick Evaluation

## 2022-02-16 NOTE — Anesthesia Postprocedure Evaluation (Signed)
Anesthesia Post Note  Patient: Amber Park  Procedure(s) Performed: ENDOSCOPIC RETROGRADE CHOLANGIOPANCREATOGRAPHY (ERCP) SPHINCTEROTOMY REMOVAL OF STONES BIOPSY     Patient location during evaluation: PACU Anesthesia Type: General Level of consciousness: awake Pain management: pain level controlled Vital Signs Assessment: post-procedure vital signs reviewed and stable Respiratory status: spontaneous breathing, nonlabored ventilation, respiratory function stable and patient connected to nasal cannula oxygen Cardiovascular status: blood pressure returned to baseline and stable Postop Assessment: no apparent nausea or vomiting Anesthetic complications: no   No notable events documented.  Last Vitals:  Vitals:   02/16/22 0950 02/16/22 2025  BP: (!) 145/95 (!) 140/54  Pulse:  69  Resp: 18 16  Temp: (!) 36.2 C 37 C  SpO2: 96% 94%    Last Pain:  Vitals:   02/16/22 2025  TempSrc: Oral  PainSc: 0-No pain                 Daemyn Gariepy P Halden Phegley

## 2022-02-16 NOTE — Progress Notes (Signed)
Pacu RN Report to floor given  Gave report to  [employer]. Room: 6E10    Discussed surgery, meds given in OR and Pacu, VS, IV fluids given, EBL, urine output, pain and other pertinent information. Also discussed if pt had any family or friends here or belongings with them.   No pain, VSS, able to tolerate sips of water. Daughter Baker Janus was sent text updates. Pt has her shoes at bedside.    Pt exits my care.

## 2022-02-16 NOTE — Op Note (Signed)
[facility] Patient Name: Amber Park Procedure Date : 02/16/2022 MRN: [id number] Attending MD: Justice Britain , MD Date of Birth: 05-Jan-1925 CSN: [id number] Age: 88 Admit Type: Inpatient Procedure:                ERCP Indications:              Bile duct stone(s), Abnormal MRCP, Elevated liver                            enzymes Providers:                Justice Britain, MD, Jeanella Cara, RN,                            Luan Moore, Technician Referring MD:             Adelfa Koh. Stoneking MD, MD, Izola Price. Sherrill Medicines:                General Anesthesia, Cipro 400 mg IV, Indomethacin                            100 mg PR, Glucagon 6.62 mg IV Complications:            No immediate complications. Estimated Blood Loss:     Estimated blood loss was minimal. Procedure:                Pre-Anesthesia Assessment:                           - Prior to the procedure, a History and Physical                            was performed, and patient medications and                            allergies were reviewed. The patient's tolerance of                            previous anesthesia was also reviewed. The risks                            and benefits of the procedure and the sedation                            options and risks were discussed with the patient.                            All questions were answered, and informed consent                            was obtained. Prior Anticoagulants: The patient has                            taken no previous anticoagulant or antiplatelet  agents except for aspirin. ASA Grade Assessment:                            III - A patient with severe systemic disease. After                            reviewing the risks and benefits, the patient was                            deemed in satisfactory condition to undergo the                            procedure.                           After  obtaining informed consent, the scope was                            passed under direct vision. Throughout the                            procedure, the patient's blood pressure, pulse, and                            oxygen saturations were monitored continuously. The                            [id number]-Q190V ([id number]) Olympus duodenoscope was                            introduced through the mouth, and used to inject                            contrast into and used to inject contrast into the                            bile duct. The ERCP was accomplished without                            difficulty. The patient tolerated the procedure. Scope In: Scope Out: Findings:      The scout film was normal.      The scope was passed under direct vision through the upper GI tract. The       Z-line was regular and was found 33 cm from the incisors. A 5 cm hiatal       hernia was present. Multiple dispersed small erosions with no bleeding       and no stigmata of recent bleeding were found in the gastric body. No       other gross lesions were noted in the entire examined stomach - biopsied       for HP evaluation. No gross lesions were noted in the duodenal bulb, in       the first portion of the duodenum and in the second portion of the       duodenum. The major papilla was  normal.      A short 0.035 inch Soft Jagwire was passed into the biliary tree. The       Hydratome sphincterotome was passed over the guidewire and the bile duct       was then deeply cannulated. Contrast was injected. I personally       interpreted the bile duct images. Ductal flow of contrast was adequate.       Image quality was adequate. Contrast extended to the hepatic ducts.       Opacification of the entire biliary tree except for the gallbladder was       successful. The maximum diameter of the ducts was 9 mm. The lower third       of the main bile duct contained filling defect thought to be a stone and       sludge. An  8 mm biliary sphincterotomy was made with a monofilament       Hydratome sphincterotome using ERBE electrocautery. There was self       limited oozing from the sphincterotomy which did not require treatment.       The biliary tree was swept with a retrieval balloon. Sludge was swept       from the duct. One stone was removed. No stones remained. An occlusion       cholangiogram was performed that showed no further significant biliary       pathology.      A pancreatogram was not performed.      The duodenoscope was withdrawn from the patient. Impression:               - Z-line regular, 33 cm from the incisors.                           - 5 cm hiatal hernia.                           - Erosive gastropathy with no bleeding and no                            stigmata of recent bleeding in the body. No other                            gross lesions in the stomach.                           - No gross lesions in the duodenal bulb, in the                            first portion of the duodenum and in the second                            portion of the duodenum.                           - The major papilla appeared normal.                           - A filling defect consistent with a stone and  sludge was seen on the cholangiogram.                           - Choledocholithiasis was found. Complete removal                            was accomplished by biliary sphincterotomy and                            balloon trawl. Recommendation:           - The patient will be observed post-procedure,                            until all discharge criteria are met.                           - Return patient to hospital ward for ongoing care.                           - Advance diet as tolerated.                           - Check liver enzymes (AST, ALT, alkaline                            phosphatase, bilirubin) in the morning.                           - Observe patient's  clinical course.                           - Watch for pancreatitis, bleeding, perforation,                            and cholangitis.                           - Await path results.                           - Keep PPI 40 mg twice daily.                           - Carafate twice daily for 2-weeks.                           - Would hold chemical VTE prophylaxis for 48 hours                            to decrease risk of post-interventional bleeding.                            If anticoagulation is necessary consider heparin                            drip without  bolus in 6-12 hours and monitor                            closely.                           - Definitive therapy for this would be                            cholecystectomy to prevent further gallstones from                            reaching the biliary duct - however with patient's                            age she has felt that she would not want this.                            Hopefully no other attacks occur, or if stones do                            make their way into the CBD that they fall, but                            cholecystitis or persisting biliary colic could be                            something to monitor for.                           - The findings and recommendations were discussed                            with the patient.                           - The findings and recommendations were discussed                            with the designated responsible adult. Procedure Code(s):        --- Professional ---                           [phone], Endoscopic retrograde                            cholangiopancreatography (ERCP); with removal of                            calculi/debris from biliary/pancreatic duct(s)                           43262, Endoscopic retrograde                            cholangiopancreatography (ERCP);  with                            sphincterotomy/papillotomy Diagnosis  Code(s):        --- Professional ---                           K44.9, Diaphragmatic hernia without obstruction or                            gangrene                           K31.89, Other diseases of stomach and duodenum                           R93.2, Abnormal findings on diagnostic imaging of                            liver and biliary tract                           K80.50, Calculus of bile duct without cholangitis                            or cholecystitis without obstruction                           R74.8, Abnormal levels of other serum enzymes CPT copyright 2019 [employer]. All rights reserved. The codes documented in this report are preliminary and upon coder review may  be revised to meet current compliance requirements. Justice Britain, MD 02/16/2022 8:53:59 AM Number of Addenda: 0

## 2022-02-16 NOTE — Transfer of Care (Signed)
Immediate Anesthesia Transfer of Care Note  Patient: Amber Park  Procedure(s) Performed: ENDOSCOPIC RETROGRADE CHOLANGIOPANCREATOGRAPHY (ERCP) SPHINCTEROTOMY REMOVAL OF STONES BIOPSY  Patient Location: PACU  Anesthesia Type:General  Level of Consciousness: oriented and patient cooperative  Airway & Oxygen Therapy: Patient Spontanous Breathing and Patient connected to nasal cannula oxygen  Post-op Assessment: Report given to RN and Post -op Vital signs reviewed and stable  Post vital signs: Reviewed and stable  Last Vitals:  Vitals Value Taken Time  BP 160/60 0859 02/16/22  Temp    Pulse 76 0859 02/16/22  Resp 21 0859 02/16/22  SpO2 92 0859 02/16/22    Last Pain:  Vitals:   02/16/22 0708  TempSrc: Temporal  PainSc: 0-No pain      Patients Stated Pain Goal: 0 (16/10/96 0454)  Complications: No notable events documented.

## 2022-02-16 NOTE — Interval H&P Note (Signed)
History and Physical Interval Note:  02/16/2022 7:38 AM  Amber Park  has presented today for surgery, with the diagnosis of Choledocholithiasis, biliary pancreatitis.  The various methods of treatment have been discussed with the patient and family. After consideration of risks, benefits and other options for treatment, the patient has consented to  Procedure(s): ENDOSCOPIC RETROGRADE CHOLANGIOPANCREATOGRAPHY (ERCP) (N/A) as a surgical intervention.  The patient's history has been reviewed, patient examined, no change in status, stable for surgery.  I have reviewed the patient's chart and labs.  Questions were answered to the patient's satisfaction.    The risks of an ERCP were discussed at length, including but not limited to the risk of perforation, bleeding, abdominal pain, post-ERCP pancreatitis (while usually mild can be severe and even life threatening).    [employer]

## 2022-02-17 ENCOUNTER — Encounter (HOSPITAL_COMMUNITY): Payer: Self-pay | Admitting: Gastroenterology

## 2022-02-17 ENCOUNTER — Other Ambulatory Visit: Payer: Self-pay | Admitting: Internal Medicine

## 2022-02-17 DIAGNOSIS — I214 Non-ST elevation (NSTEMI) myocardial infarction: Secondary | ICD-10-CM | POA: Diagnosis not present

## 2022-02-17 DIAGNOSIS — Z9889 Other specified postprocedural states: Secondary | ICD-10-CM

## 2022-02-17 DIAGNOSIS — R7989 Other specified abnormal findings of blood chemistry: Secondary | ICD-10-CM | POA: Diagnosis not present

## 2022-02-17 DIAGNOSIS — K8050 Calculus of bile duct without cholangitis or cholecystitis without obstruction: Secondary | ICD-10-CM | POA: Diagnosis not present

## 2022-02-17 LAB — CBC
HCT: 37.9 % (ref 36.0–46.0)
Hemoglobin: 12.7 g/dL (ref 12.0–15.0)
MCH: 28.2 pg (ref 26.0–34.0)
MCHC: 33.5 g/dL (ref 30.0–36.0)
MCV: 84 fL (ref 80.0–100.0)
Platelets: 237 10*3/uL (ref 150–400)
RBC: 4.51 MIL/uL (ref 3.87–5.11)
RDW: 12.9 % (ref 11.5–15.5)
WBC: 9.8 10*3/uL (ref 4.0–10.5)
nRBC: 0 % (ref 0.0–0.2)

## 2022-02-17 LAB — COMPREHENSIVE METABOLIC PANEL
ALT: 46 U/L — ABNORMAL HIGH (ref 0–44)
AST: 45 U/L — ABNORMAL HIGH (ref 15–41)
Albumin: 3 g/dL — ABNORMAL LOW (ref 3.5–5.0)
Alkaline Phosphatase: 77 U/L (ref 38–126)
Anion gap: 8 (ref 5–15)
BUN: 22 mg/dL (ref 8–23)
CO2: 23 mmol/L (ref 22–32)
Calcium: 9 mg/dL (ref 8.9–10.3)
Chloride: 105 mmol/L (ref 98–111)
Creatinine, Ser: 1.28 mg/dL — ABNORMAL HIGH (ref 0.44–1.00)
GFR, Estimated: 39 mL/min — ABNORMAL LOW (ref >60)
Glucose, Bld: 124 mg/dL — ABNORMAL HIGH (ref 70–99)
Potassium: 4.2 mmol/L (ref 3.5–5.1)
Sodium: 136 mmol/L (ref 135–145)
Total Bilirubin: 0.5 mg/dL (ref 0.3–1.2)
Total Protein: 5.7 g/dL — ABNORMAL LOW (ref 6.5–8.1)

## 2022-02-17 LAB — MAGNESIUM: Magnesium: 1.7 mg/dL (ref 1.7–2.4)

## 2022-02-17 MED ORDER — SUCRALFATE 1 GM/10ML PO SUSP: 1.0000 g | Freq: Two times a day (BID) | ORAL | 0 refills | Status: DC

## 2022-02-17 MED ORDER — ASPIRIN 81 MG PO TBEC: 81.0000 mg | DELAYED_RELEASE_TABLET | Freq: Every day | ORAL | 12 refills | Status: AC

## 2022-02-17 MED ORDER — METOPROLOL TARTRATE 25 MG PO TABS: 25.0000 mg | ORAL_TABLET | Freq: Two times a day (BID) | ORAL | 0 refills | Status: AC

## 2022-02-17 NOTE — Discharge Summary (Signed)
Physician Discharge Summary   Patient: Amber Park MRN: [id number] DOB: 06-14-1925  Admit date:     02/12/2022  Discharge date: 02/17/22  Discharge Physician: Bonnell Public   PCP: Lajean Manes, MD   Recommendations at discharge:    Follow up with PCP and GI team within 1 week of discharge.  Discharge Diagnoses: Principal Problem:   NSTEMI (non-ST elevated myocardial infarction) ([place]) Active Problems:   GERD   Hiatal hernia   Colon cancer, ascending (HCC)   Hypokalemia   HTN (hypertension)   Hypomagnesemia   LFT elevation   Choledocholithiasis     Hospital Course: Patient is a 88 year old female with past medical history significant for GERD, colon cancer status post resection in remission, HTN, glaucoma, history of AVMs, history of iron deficiency anemia in the past, hiatal hernia hospital with chest pain with radiation to the abdomen and back.  EMS was called and patient received aspirin 324 mg and 2 sublingual nitro with improvement in her chest pain. Patient stated chest pain, back pain before dinner but with food made it worse.  Had a stress test done 20 years back.  Patient was then considered for admission to the hospital for further work-up and treatment.  On presentation, troponins were noted to be elevated (ranging from 90 and peaked at 158).  Cardiology team was consulted to assist with patient management.  Patient underwent cardiac catheterization.  Cardiac cath did not reveal significant coronary artery disease.  During the hospital stay, the patient was noted to have elevated lipase and LFT and MRCP was done which showed stone versus debris's in the bile duct.  GI was consulted and patient underwent ERCP 02/16/2022 with removal of the stone.  Patient has remained chest and abdominal pain-free.  Patient be discharged home to the care of the primary care provider, GI and cardiac teams.  Assessment and Plan: Chest pain/elevated troponin Troponin was elevated up to  90.  EKG showed left bundle branch block unchanged prior to before.  Cardiology was consulted and was initially on IV heparin drip. Cardiology was consulted and since there was some regional wall motion abnormality on the echocardiogram.  Patient underwent  left heart catheterization with no significant coronary artery disease.  Continue metoprolol, aspirin.  Statin on hold due to elevated LFT.  Lipid panel showing total cholesterol of 251 with LDL of 160.    Epigastric pain, elevated LFTs, elevated lipase likely secondary to choledocholithiasis.  Concern for acute pancreatitis but CT scan did not show any acute findings.  MRCP was performed which showed some sludge versus stones in the distal CBD with no gallstones.  LFTs trending down.  GI on board and  underwent ERCP with stone removal on 02/16/2022  with biliary sphincterotomy and balloon trawl.  GI recommends to hold heparin prophylaxis for next 48 hours.  Okay to start aspirin 02/17/2022.  We will check LFT in the morning.  Diet has been advanced.   History of colon cancer Patient has been followed by Dr Learta Codding as outpatient.    GERD/hiatal hernia On  Protonix twice a day   HTN (hypertension) Continue Norvasc and metoprolol.  Hold HCTZ at this time.     Hypokalemia Improved after replacement.  Latest potassium of 3.7.   Hypomagnesemia Improved after replacement.  Latest magnesium of 1.8   Consultants: Cardiology and gastroenterology.  Procedures performed:  ERCP: - Z-line regular, 33 cm from the incisors. - 5 cm hiatal hernia. - Erosive gastropathy with no bleeding and  no stigmata of recent bleeding in the body. No other gross lesions in the stomach. - No gross lesions in the duodenal bulb, in the first portion of the duodenum and in the second portion of the duodenum. - The major papilla appeared normal. - A filling defect consistent with a stone and sludge was seen on the cholangiogram. - Choledocholithiasis was found. Complete  removal was accomplished by biliary sphincterotomy and balloon trawl.  Cardiac catheterization revealed: Angiographically trivial CAD no more than 20% lesions in major vessels.  Distal tapering vessels fade out near the apex but no culprit lesion. Hemodynamics: LVEDP 18 mmHg.  Echo revealed: 1. Left ventricular ejection fraction, by estimation, is 55 to 60%. The  left ventricle has normal function. The left ventricle has no regional  wall motion abnormalities. There is mild left ventricular hypertrophy.  Left ventricular diastolic parameters  are indeterminate.   2. Right ventricular systolic function is normal. The right ventricular  size is normal. There is normal pulmonary artery systolic pressure. The  estimated right ventricular systolic pressure is 13.2 mmHg.   3. The mitral valve is normal in structure. Trivial mitral valve  regurgitation. No evidence of mitral stenosis.   4. The aortic valve is tricuspid. There is mild calcification of the  aortic valve. Aortic valve regurgitation is trivial. No aortic stenosis is  present.   5. The inferior vena cava is normal in size with greater than 50%  respiratory variability, suggesting right atrial pressure of 3 mmHg.   Disposition: Home Diet recommendation: Cardiac Discharge Diet Orders (From admission, onward)     Start     Ordered   02/17/22 0000  Diet - low sodium heart healthy        02/17/22 1259           Cardiac diet DISCHARGE MEDICATION: Allergies as of 02/17/2022       Reactions   Ferrous Gluconate    Back and chest pain, nausea, diarrhea, chills, hypotension, presyncope   Anesthesia S-i-40 [propofol] Nausea And Vomiting   Anesthetics -- N/V   Codeine Nausea And Vomiting   Meperidine Hcl Nausea And Vomiting   Pneumococcal Vac Polyvalent    Other reaction(s): red and swollen   Simvastatin    Other reaction(s): muscle pain        Medication List     STOP taking these medications    hydrocortisone  cream 1 %   loperamide 2 MG tablet Commonly known as: IMODIUM A-D       TAKE these medications    acetaminophen 500 MG tablet Commonly known as: TYLENOL Take 500 mg by mouth daily as needed for mild pain.   amLODipine 10 MG tablet Commonly known as: NORVASC Take 10 mg by mouth daily.   aspirin EC 81 MG tablet Take 1 tablet (81 mg total) by mouth daily. Swallow whole. Start taking on: February 18, 2022   hydrochlorothiazide 25 MG tablet Commonly known as: HYDRODIURIL Take 25 mg by mouth daily.   latanoprost 0.005 % ophthalmic solution Commonly known as: XALATAN Place 1 drop into both eyes at bedtime.   metoprolol tartrate 25 MG tablet Commonly known as: LOPRESSOR Take 1 tablet (25 mg total) by mouth 2 (two) times daily.   pantoprazole 40 MG tablet Commonly known as: PROTONIX TAKE 1 TABLET(40 MG) BY MOUTH DAILY What changed: See the new instructions.   potassium chloride 10 MEQ tablet Commonly known as: KLOR-CON M Take 10 mEq by mouth daily.   sucralfate 1  GM/10ML suspension Commonly known as: CARAFATE Take 10 mLs (1 g total) by mouth 2 (two) times daily.   tiZANidine 4 MG tablet Commonly known as: ZANAFLEX Take 1 tablet (4 mg total) by mouth every 8 (eight) hours as needed for muscle spasms. What changed: when to take this   Vitamin D3 50 MCG (2000 UT) capsule Take 2,000 Units by mouth daily.        Follow-up Information     Stoneking, Hal, MD Follow up in 1 week(s).   Specialty: Internal Medicine Contact information: [address] [place] [address] [phone]         Sueanne Margarita, MD .   Specialty: Cardiology Contact information: [phone] [address] [place] [address] [phone]         Jackquline Denmark, MD Follow up in 1 week(s).   Specialties: Gastroenterology, Internal Medicine Why: for liver followup Contact information: [place]. [place] [address] [phone]                 Discharge Exam: Filed Weights   02/13/22 0427 02/13/22 1052 02/16/22 0708  Weight: 66.4 kg 66.4 kg 65.3 kg      Condition at discharge: stable  The results of significant diagnostics from this hospitalization (including imaging, microbiology, ancillary and laboratory) are listed below for reference.   Imaging Studies: DG ERCP  Result Date: 02/16/2022 CLINICAL DATA:  88 year old female with choledocholithiasis EXAM: ERCP TECHNIQUE: Multiple spot images obtained with the fluoroscopic device and submitted for interpretation post-procedure. FLUOROSCOPY: Radiation Exposure Index (as provided by the fluoroscopic device): 19.6 mGy Kerma COMPARISON:  MR 02/14/2022 FINDINGS: Limited intraoperative fluoroscopic spot images during ERCP. Initial image demonstrates endoscope projecting over the upper abdomen. Subsequently there is cannulation of the ampulla with placement of a safety wire. Partial opacification of the extrahepatic biliary ducts. Deployment of a balloon retrieval catheter. IMPRESSION: Limited images during ERCP demonstrates deployment of a balloon retrieval catheter, as above. Please refer to the dictated operative report for full details of intraoperative findings and procedure. Electronically Signed   By: Corrie Mckusick D.O.   On: 02/16/2022 09:07   MR ABDOMEN MRCP WO CONTRAST  Result Date: 02/14/2022 CLINICAL DATA:  88 year old female with elevated lipase. Epigastric pain. EXAM: MRI ABDOMEN WITHOUT CONTRAST  (INCLUDING MRCP) TECHNIQUE: Multiplanar multisequence MR imaging of the abdomen was performed. Heavily T2-weighted images of the biliary and pancreatic ducts were obtained, and three-dimensional MRCP images were rendered by post processing. COMPARISON:  None Available. FINDINGS: Lower chest:  Lung bases are clear. Hepatobiliary: No intrahepatic biliary duct dilatation. No gallstones are evident within the lumen the gallbladder. The gallbladder is distended but within normal limits  measuring 3.7 cm in diameter. No pericholecystic fluid identified. Common bile duct is upper limits of normal at 6 mm. Small filling defect within the distal common bile duct within 1 cm the ampulla is present on several series (images 18/series 9; image 21/series 3; image 70/series 2). Pancreas: No pancreatic duct dilatation. No peripancreatic inflammation in the retroperitoneal fat. No fluid collections. Normal pancreatic parenchyma density T1 weighted imaging (series 8). No ductal variation (series 3) Spleen: Normal spleen. Adrenals/urinary tract: Adrenal glands and kidneys are normal. Stomach/Bowel: Large sliding-type hiatal hernia with 2/3 of stomach above the hemidiaphragms posterior to the LEFT heart. Limited view the bowel unremarkable. Vascular/Lymphatic: Abdominal aortic normal caliber. No retroperitoneal periportal lymphadenopathy. Musculoskeletal: No aggressive osseous lesion IMPRESSION: 1. No evidence acute pancreatitis. No variant pancreatic ductal anatomy.  2. Small stones versus debris within distal common bile duct. No biliary duct dilatation. 3. No cholelithiasis identified.  No evidence acute cholecystitis. 4. Large sliding-type hiatal hernia. Electronically Signed   By: Suzy Bouchard M.D.   On: 02/14/2022 14:13   MR 3D Recon At Scanner  Result Date: 02/14/2022 CLINICAL DATA:  88 year old female with elevated lipase. Epigastric pain. EXAM: MRI ABDOMEN WITHOUT CONTRAST  (INCLUDING MRCP) TECHNIQUE: Multiplanar multisequence MR imaging of the abdomen was performed. Heavily T2-weighted images of the biliary and pancreatic ducts were obtained, and three-dimensional MRCP images were rendered by post processing. COMPARISON:  None Available. FINDINGS: Lower chest:  Lung bases are clear. Hepatobiliary: No intrahepatic biliary duct dilatation. No gallstones are evident within the lumen the gallbladder. The gallbladder is distended but within normal limits measuring 3.7 cm in diameter. No  pericholecystic fluid identified. Common bile duct is upper limits of normal at 6 mm. Small filling defect within the distal common bile duct within 1 cm the ampulla is present on several series (images 18/series 9; image 21/series 3; image 70/series 2). Pancreas: No pancreatic duct dilatation. No peripancreatic inflammation in the retroperitoneal fat. No fluid collections. Normal pancreatic parenchyma density T1 weighted imaging (series 8). No ductal variation (series 3) Spleen: Normal spleen. Adrenals/urinary tract: Adrenal glands and kidneys are normal. Stomach/Bowel: Large sliding-type hiatal hernia with 2/3 of stomach above the hemidiaphragms posterior to the LEFT heart. Limited view the bowel unremarkable. Vascular/Lymphatic: Abdominal aortic normal caliber. No retroperitoneal periportal lymphadenopathy. Musculoskeletal: No aggressive osseous lesion IMPRESSION: 1. No evidence acute pancreatitis. No variant pancreatic ductal anatomy. 2. Small stones versus debris within distal common bile duct. No biliary duct dilatation. 3. No cholelithiasis identified.  No evidence acute cholecystitis. 4. Large sliding-type hiatal hernia. Electronically Signed   By: Suzy Bouchard M.D.   On: 02/14/2022 14:13   CARDIAC CATHETERIZATION  Result Date: 02/14/2022   Mid LAD to Dist LAD lesion is 20% stenosed with 20% stenosed side branch in 2nd Diag.   Dist LAD lesion is 50% stenosed.  Essentially the vessel tapers distally after the major second diagonal branch.   Ost RCA to Prox RCA lesion is 30% stenosed.  (Mild catheter related spasm)   Prox RCA lesion is 20% stenosed.   -----------------------------------------------------------------   The left ventricular systolic function is normal by recent echocardiogram.   LV end diastolic pressure is mildly elevated.   There is no aortic valve stenosis. Impression: Angiographically trivial CAD no more than 20% lesions in major vessels.  Distal tapering vessels fade out near the  apex but no culprit lesion. Hemodynamics: LVEDP 18 mmHg.  Recommendations: Consider noncardiac etiology for chest pain, although cannot exclude microvascular disease/spasm, but no catheterization evidence to suggest that Zephyr band removal per protocol. DC IV heparin Stable from a Cardiac Cath standpoint for discharge in the morning Glenetta Hew, MD  ECHOCARDIOGRAM COMPLETE  Result Date: 02/13/2022    ECHOCARDIOGRAM REPORT   Patient Name:   Amber Park Date of Exam: 02/13/2022 Medical Rec #:  [id number]      Height:       61.0 in Accession #:    [id number]     Weight:       146.4 lb Date of Birth:  02/21/1925      BSA:          1.654 m Patient Age:    12 years       BP:           154/65 mmHg  Patient Gender: F              HR:           81 bpm. Exam Location:  Inpatient Procedure: 2D Echo, Cardiac Doppler and Color Doppler Indications:    Elevated troponin  History:        Patient has no prior history of Echocardiogram examinations.                 Risk Factors:Hypertension.  Sonographer:    Jefferey Pica Referring Phys: [place]  1. Left ventricular ejection fraction, by estimation, is 55 to 60%. The left ventricle has normal function. The left ventricle has no regional wall motion abnormalities. There is mild left ventricular hypertrophy. Left ventricular diastolic parameters are indeterminate.  2. Right ventricular systolic function is normal. The right ventricular size is normal. There is normal pulmonary artery systolic pressure. The estimated right ventricular systolic pressure is 46.2 mmHg.  3. The mitral valve is normal in structure. Trivial mitral valve regurgitation. No evidence of mitral stenosis.  4. The aortic valve is tricuspid. There is mild calcification of the aortic valve. Aortic valve regurgitation is trivial. No aortic stenosis is present.  5. The inferior vena cava is normal in size with greater than 50% respiratory variability, suggesting right atrial  pressure of 3 mmHg. FINDINGS  Left Ventricle: Left ventricular ejection fraction, by estimation, is 55 to 60%. The left ventricle has normal function. The left ventricle has no regional wall motion abnormalities. The left ventricular internal cavity size was normal in size. There is  mild left ventricular hypertrophy. Left ventricular diastolic parameters are indeterminate. Right Ventricle: The right ventricular size is normal. No increase in right ventricular wall thickness. Right ventricular systolic function is normal. There is normal pulmonary artery systolic pressure. The tricuspid regurgitant velocity is 2.79 m/s, and  with an assumed right atrial pressure of 3 mmHg, the estimated right ventricular systolic pressure is 70.3 mmHg. Left Atrium: Left atrial size was normal in size. Right Atrium: Right atrial size was normal in size. Pericardium: There is no evidence of pericardial effusion. Mitral Valve: The mitral valve is normal in structure. Trivial mitral valve regurgitation. No evidence of mitral valve stenosis. Tricuspid Valve: The tricuspid valve is normal in structure. Tricuspid valve regurgitation is mild . No evidence of tricuspid stenosis. Aortic Valve: The aortic valve is tricuspid. There is mild calcification of the aortic valve. Aortic valve regurgitation is trivial. No aortic stenosis is present. Aortic valve peak gradient measures 9.7 mmHg. Pulmonic Valve: The pulmonic valve was normal in structure. Pulmonic valve regurgitation is not visualized. No evidence of pulmonic stenosis. Aorta: The aortic root is normal in size and structure. Venous: The inferior vena cava is normal in size with greater than 50% respiratory variability, suggesting right atrial pressure of 3 mmHg. IAS/Shunts: No atrial level shunt detected by color flow Doppler.  LEFT VENTRICLE PLAX 2D LVIDd:         3.50 cm   Diastology LVIDs:         2.70 cm   LV e' medial:    7.51 cm/s LV PW:         1.15 cm   LV E/e' medial:  12.2 LV  IVS:        1.15 cm   LV e' lateral:   6.76 cm/s LVOT diam:     2.00 cm   LV E/e' lateral: 13.6 LV SV:  61 LV SV Index:   37 LVOT Area:     3.14 cm  RIGHT VENTRICLE             IVC RV S prime:     14.20 cm/s  IVC diam: 1.20 cm TAPSE (M-mode): 2.2 cm LEFT ATRIUM             Index        RIGHT ATRIUM           Index LA diam:        3.25 cm 1.96 cm/m   RA Area:     13.70 cm LA Vol (A2C):   28.7 ml 17.35 ml/m  RA Volume:   32.60 ml  19.71 ml/m LA Vol (A4C):   33.7 ml 20.37 ml/m LA Biplane Vol: 31.2 ml 18.86 ml/m  AORTIC VALVE                 PULMONIC VALVE AV Area (Vmax): 1.87 cm     PV Vmax:       1.11 m/s AV Vmax:        156.00 cm/s  PV Peak grad:  4.9 mmHg AV Peak Grad:   9.7 mmHg LVOT Vmax:      93.10 cm/s LVOT Vmean:     55.700 cm/s LVOT VTI:       0.195 m  AORTA Ao Root diam: 2.95 cm Ao Asc diam:  3.30 cm MITRAL VALVE                TRICUSPID VALVE MV Area (PHT): 8.25 cm     TR Peak grad:   31.1 mmHg MV Decel Time: 92 msec      TR Vmax:        279.00 cm/s MV E velocity: 91.80 cm/s MV A velocity: 146.00 cm/s  SHUNTS MV E/A ratio:  0.63         Systemic VTI:  0.20 m                             Systemic Diam: 2.00 cm Cherlynn Kaiser MD Electronically signed by Cherlynn Kaiser MD Signature Date/Time: 02/13/2022/12:49:36 PM    Final    US ABDOMEN LIMITED RUQ (LIVER/GB)  Result Date: 02/13/2022 CLINICAL DATA:  88 year old female with abnormal lipase. Epigastric pain. EXAM: ULTRASOUND ABDOMEN LIMITED RIGHT UPPER QUADRANT COMPARISON:  CTA chest abdomen and pelvis 02/12/2022. FINDINGS: Gallbladder: Gallbladder appears mildly distended similar to the CTA yesterday, with normal wall thickness. However, there is some dependent echogenic material suspicious for small gallstones in addition to sludge (image 30), approximately 8 mm. No pericholecystic fluid. No sonographic Murphy sign elicited. Common bile duct: Diameter: 5 mm, normal. Liver: Echogenic liver with probable fatty sparing at the gallbladder fossa  (image 66). No other discrete liver lesion. No intrahepatic biliary ductal dilatation. Portal vein is patent on color Doppler imaging with normal direction of blood flow towards the liver. Other: Negative visible right kidney. IMPRESSION: 1. Evidence of sludge and small gallstones, but no strong evidence of acute cholecystitis. 2. No evidence of bile duct obstruction. 3. Hepatic steatosis. Electronically Signed   By: Genevie Ann M.D.   On: 02/13/2022 11:23   CT Angio Chest/Abd/Pel for Dissection W and/or Wo Contrast  Result Date: 02/12/2022 CLINICAL DATA:  Chest or back pain with aortic dissection suspected. Substernal chest pain this afternoon radiating to the abdomen. EXAM: CT ANGIOGRAPHY CHEST, ABDOMEN AND PELVIS TECHNIQUE: Non-contrast CT of  the chest was initially obtained. Multidetector CT imaging through the chest, abdomen and pelvis was performed using the standard protocol during bolus administration of intravenous contrast. Multiplanar reconstructed images and MIPs were obtained and reviewed to evaluate the vascular anatomy. RADIATION DOSE REDUCTION: This exam was performed according to the departmental dose-optimization program which includes automated exposure control, adjustment of the mA and/or kV according to patient size and/or use of iterative reconstruction technique. CONTRAST:  74m OMNIPAQUE IOHEXOL 350 MG/ML SOLN COMPARISON:  02/05/2021 FINDINGS: CTA CHEST FINDINGS Cardiovascular: Unenhanced images of the chest demonstrate calcification of the aorta and coronary arteries. No evidence of intramural hematoma. Images obtained during arterial phase after intravenous contrast material administration demonstrate normal caliber thoracic aorta. No aortic dissection. Great vessel origins are patent. Normal heart size. No pericardial effusions. Central pulmonary arteries are patent without evidence of significant pulmonary embolus. Mediastinum/Nodes: Thyroid gland is unremarkable. Large esophageal hiatal  hernia. Esophagus is decompressed. No significant lymphadenopathy. Lungs/Pleura: Mild atelectasis in the lungs. Focal subpleural opacity in the right upper lung is unchanged since prior study. Tiny pulmonary nodules are unchanged. Musculoskeletal: Degenerative changes.  No acute bony abnormalities. Review of the MIP images confirms the above findings. CTA ABDOMEN AND PELVIS FINDINGS VASCULAR Aorta: Diffuse aortic calcification. Normal caliber abdominal aorta. No dissection. Celiac: Focal calcific stenosis of the origin of the celiac axis, representing about 50% diameter reduction. Prompt reconstitution. SMA: The superior mesenteric artery is small in caliber but appears patent. Focal proximal stenosis with about 40% diameter reduction. Renals: Renal arteries are patent bilaterally. No aneurysm or occlusion. Nephrograms are symmetrical. IMA: Inferior mesenteric artery is patent. Inflow: Patent without evidence of aneurysm, dissection, vasculitis or significant stenosis. Veins: No obvious venous abnormality within the limitations of this arterial phase study. Review of the MIP images confirms the above findings. NON-VASCULAR Hepatobiliary: No focal liver abnormality is seen. No gallstones, gallbladder wall thickening, or biliary dilatation. Pancreas: Unremarkable. No pancreatic ductal dilatation or surrounding inflammatory changes. Spleen: Normal in size without focal abnormality. Adrenals/Urinary Tract: Adrenal glands are unremarkable. Kidneys are normal, without renal calculi, focal lesion, or hydronephrosis. Bladder is unremarkable. Stomach/Bowel: Stomach, small bowel, and colon are not abnormally distended. Partial right hemicolectomy with ileocolonic anastomosis. No wall thickening or inflammatory changes. Lymphatic: Nonspecific lymphadenopathy in the mesentery with mild mesenteric stranding. Largest lymph nodes measure up to about 8 mm short axis dimension. Appearance is nonspecific. Changes are more prominent  than on the prior study. Possibly inflammatory or reactive change but early metastatic disease could potentially have this appearance. Reproductive: Status post hysterectomy. No adnexal masses. Other: No abdominal wall hernia or abnormality. No abdominopelvic ascites. Musculoskeletal: Degenerative changes. No acute bony abnormalities. Postoperative change in the left hip. Review of the MIP images confirms the above findings. IMPRESSION: 1. Aortic atherosclerosis. No evidence of aortic aneurysm or dissection. 2. No evidence of metastatic disease in the chest. 3. Postoperative right hemicolectomy with patent ileocolonic anastomosis. 4. Borderline prominence of mesenteric lymph nodes with mesenteric stranding, increased since prior study. Appearances are nonspecific. 5. Large esophageal hiatal hernia. 6. 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 Electronically Signed   By: WLucienne CapersM.D.   On: 02/12/2022 22:45   DG Chest Port 1 View  Result Date: 02/12/2022 CLINICAL DATA:  Chest pain EXAM: PORTABLE CHEST 1 VIEW COMPARISON:  07/24/2020 FINDINGS: Lungs are clear.  No pleural effusion or pneumothorax. The heart is normal in size.  Thoracic aortic atherosclerosis. Large hiatal hernia. IMPRESSION: No  evidence of acute cardiopulmonary disease. Electronically Signed   By: Julian Hy M.D.   On: 02/12/2022 21:00    Microbiology: Results for orders placed or performed during the hospital encounter of 07/24/20  Resp Panel by RT-PCR (Flu A&B, Covid) Nasopharyngeal Swab     Status: None   Collection Time: 07/24/20  8:57 PM   Specimen: Nasopharyngeal Swab; Nasopharyngeal(NP) swabs in vial transport medium  Result Value Ref Range Status   SARS Coronavirus 2 by RT PCR NEGATIVE NEGATIVE Final    Comment: (NOTE) SARS-CoV-2 target nucleic acids are NOT DETECTED.  The SARS-CoV-2 RNA is generally detectable in upper respiratory specimens during the  acute phase of infection. The lowest concentration of SARS-CoV-2 viral copies this assay can detect is 138 copies/mL. A negative result does not preclude SARS-Cov-2 infection and should not be used as the sole basis for treatment or other patient management decisions. A negative result may occur with  improper specimen collection/handling, submission of specimen other than nasopharyngeal swab, presence of viral mutation(s) within the areas targeted by this assay, and inadequate number of viral copies(<138 copies/mL). A negative result must be combined with clinical observations, patient history, and epidemiological information. The expected result is Negative.  Fact Sheet for Patients:  EntrepreneurPulse.com.au  Fact Sheet for Healthcare Providers:  IncredibleEmployment.be  This test is no t yet approved or cleared by the [place] FDA and  has been authorized for detection and/or diagnosis of SARS-CoV-2 by FDA under an Emergency Use Authorization (EUA). This EUA will remain  in effect (meaning this test can be used) for the duration of the COVID-19 declaration under Section 564(b)(1) of the Act, 21 U.S.C.section 360bbb-3(b)(1), unless the authorization is terminated  or revoked sooner.       Influenza A by PCR NEGATIVE NEGATIVE Final   Influenza B by PCR NEGATIVE NEGATIVE Final    Comment: (NOTE) The Xpert Xpress SARS-CoV-2/FLU/RSV plus assay is intended as an aid in the diagnosis of influenza from Nasopharyngeal swab specimens and should not be used as a sole basis for treatment. Nasal washings and aspirates are unacceptable for Xpert Xpress SARS-CoV-2/FLU/RSV testing.  Fact Sheet for Patients: EntrepreneurPulse.com.au  Fact Sheet for Healthcare Providers: IncredibleEmployment.be  This test is not yet approved or cleared by the [place] FDA and has been authorized for detection and/or  diagnosis of SARS-CoV-2 by FDA under an Emergency Use Authorization (EUA). This EUA will remain in effect (meaning this test can be used) for the duration of the COVID-19 declaration under Section 564(b)(1) of the Act, 21 U.S.C. section 360bbb-3(b)(1), unless the authorization is terminated or revoked.  Performed at [facility], [place] [address] [place], [place] [address]   Blood culture (routine x 2)     Status: None   Collection Time: 07/24/20  8:57 PM   Specimen: BLOOD  Result Value Ref Range Status   Specimen Description BLOOD RIGHT ANTECUBITAL  Final   Special Requests   Final    BOTTLES DRAWN AEROBIC AND ANAEROBIC Blood Culture adequate volume   Culture   Final    NO GROWTH 5 DAYS Performed at [facility], [address], [place], [place] [address]    Report Status 07/30/2020 FINAL  Final  Urine culture     Status: Abnormal   Collection Time: 07/24/20 10:48 PM   Specimen: Urine, Random  Result Value Ref Range Status   Specimen Description   Final    URINE, RANDOM Performed at [facility],  [place] [address], [place], [place] [address]    Special Requests   Final    NONE Performed at [facility], [place] [address], [place], [place] [address]    Culture (A)  Final    10,000 COLONIES/mL MULTIPLE SPECIES PRESENT, SUGGEST RECOLLECTION   Report Status 07/26/2020 FINAL  Final    Labs: CBC: Recent Labs  Lab 02/13/22 0510 02/14/22 0153 02/15/22 0155 02/16/22 0147 02/17/22 0304  WBC 9.5 8.2 7.4 7.0 9.8  HGB 15.2* 13.4 13.2 13.0 12.7  HCT 42.8 39.0 39.1 37.1 37.9  MCV 81.4 83.3 85.2 82.4 84.0  PLT 281 239 235 233 885   Basic Metabolic Panel: Recent Labs  Lab 02/12/22 2333 02/13/22 0510 02/14/22 0153 02/15/22 0155 02/16/22 0147 02/17/22 0304  NA  --  137 133* 136 138 136  K  --  3.0* 4.4 4.4 3.7 4.2  CL  --  100 103 104 105 105  CO2  --  '27 23 25 25 23  '$ GLUCOSE  --  111* 103* 90 97 124*  BUN  --   '15 12 14 17 22  '$ CREATININE  --  1.19* 1.05* 1.20* 1.07* 1.28*  CALCIUM  --  9.0 8.4* 9.0 9.2 9.0  MG 1.5* 2.2 1.8  --  1.8 1.7  PHOS 3.7 3.1  --   --   --   --    Liver Function Tests: Recent Labs  Lab 02/12/22 2009 02/13/22 0510 02/14/22 0153 02/15/22 0155 02/17/22 0304  AST 33 183* 138* 73* 45*  ALT 17 110* 106* 66* 46*  ALKPHOS 97 102 93 81 77  BILITOT 0.4 1.2 1.0 1.0 0.5  PROT 6.6 6.7 5.7* 5.9* 5.7*  ALBUMIN 3.8 3.7 3.1* 3.1* 3.0*   CBG: No results for input(s): "GLUCAP" in the last 168 hours.  Discharge time spent: greater than 30 minutes.  Signed: Bonnell Public, MD [facility] 02/17/2022

## 2022-02-17 NOTE — Progress Notes (Signed)
Gastroenterology Inpatient Follow-up Note   PATIENT IDENTIFICATION  Amber Park is a 88 y.o. female with a pmh significant for hypertension, glaucoma, GERD, hiatal hernia, small bowel AVMs, colon cancer (status post resection).  Who presented with atypical chest pain found to have cholelithiasis and choledocholithiasis (now status post ERCP). Hospital Day: 6  SUBJECTIVE  The patient's labs and chart were reviewed this morning. The patient is interviewed and overall is doing and feeling well. The patient denies fevers or chills or abdominal pain. She feels hungry and wants to continue to have her normal diet. No evidence of melena or change in stools. She is hopeful to go home soon.   OBJECTIVE  Scheduled Inpatient Medications:   amLODipine  10 mg Oral Daily   aspirin EC  81 mg Oral Daily   latanoprost  1 drop Both Eyes QHS   metoprolol tartrate  25 mg Oral BID   pantoprazole  40 mg Oral BID   sodium chloride flush  3 mL Intravenous Q12H   sodium chloride flush  3 mL Intravenous Q12H   sodium chloride flush  3 mL Intravenous Q12H   sucralfate  1 g Oral BID   tiZANidine  4 mg Oral QHS   Continuous Inpatient Infusions:   sodium chloride     sodium chloride Stopped (02/14/22 0440)   PRN Inpatient Medications: sodium chloride, sodium chloride, acetaminophen **OR** acetaminophen, HYDROcodone-acetaminophen, morphine injection, ondansetron (ZOFRAN) IV, sodium chloride flush, sodium chloride flush   Physical Examination  Temp:  [97.1 F (36.2 C)-98.6 F (37 C)] 98.1 F (36.7 C) (07/30 0744) Pulse Rate:  [66-80] 80 (07/30 0744) Resp:  [16-18] 18 (07/30 0744) BP: (133-156)/(50-95) 141/50 (07/30 0744) SpO2:  [94 %-96 %] 95 % (07/30 0744) Temp (24hrs), Avg:97.8 F (36.6 C), Min:97.1 F (36.2 C), Max:98.6 F (37 C)  Weight: 65.3 kg GEN: NAD, appears stated age, doesn't appear chronically ill PSYCH: Cooperative, without pressured speech EYE: Conjunctivae pink, sclerae  anicteric ENT: MMM CV: Nontachycardic RESP: No audible wheezing GI: NABS, soft, NT/ND, without rebound or guarding MSK/EXT: Bilateral pedal edema present SKIN: No jaundice NEURO:  Alert & Oriented x 3, no focal deficits   Review of Data   Laboratory Studies   Recent Labs  Lab 02/12/22 2333 02/13/22 0510 02/14/22 0153 02/17/22 0304  NA  --  137   < > 136  K  --  3.0*   < > 4.2  CL  --  100   < > 105  CO2  --  27   < > 23  BUN  --  15   < > 22  CREATININE  --  1.19*   < > 1.28*  GLUCOSE  --  111*   < > 124*  CALCIUM  --  9.0   < > 9.0  MG 1.5* 2.2   < > 1.7  PHOS 3.7 3.1  --   --    < > = values in this interval not displayed.   Recent Labs  Lab 02/17/22 0304  AST 45*  ALT 46*  ALKPHOS 77    Recent Labs  Lab 02/15/22 0155 02/16/22 0147 02/17/22 0304  WBC 7.4 7.0 9.8  HGB 13.2 13.0 12.7  HCT 39.1 37.1 37.9  PLT 235 233 237    Imaging Studies  DG ERCP  Result Date: 02/16/2022 CLINICAL DATA:  88 year old female with choledocholithiasis EXAM: ERCP TECHNIQUE: Multiple spot images obtained with the fluoroscopic device and submitted for interpretation post-procedure. FLUOROSCOPY: Radiation  Exposure Index (as provided by the fluoroscopic device): 19.6 mGy Kerma COMPARISON:  MR 02/14/2022 FINDINGS: Limited intraoperative fluoroscopic spot images during ERCP. Initial image demonstrates endoscope projecting over the upper abdomen. Subsequently there is cannulation of the ampulla with placement of a safety wire. Partial opacification of the extrahepatic biliary ducts. Deployment of a balloon retrieval catheter. IMPRESSION: Limited images during ERCP demonstrates deployment of a balloon retrieval catheter, as above. Please refer to the dictated operative report for full details of intraoperative findings and procedure. Electronically Signed   By: Corrie Mckusick D.O.   On: 02/16/2022 09:07    GI Procedures and Studies  ERCP - Z-line regular, 33 cm from the incisors. - 5 cm  hiatal hernia. - Erosive gastropathy with no bleeding and no stigmata of recent bleeding in the body. No other gross lesions in the stomach. - No gross lesions in the duodenal bulb, in the first portion of the duodenum and in the second portion of the duodenum. - The major papilla appeared normal. - A filling defect consistent with a stone and sludge was seen on the cholangiogram. - Choledocholithiasis was found. Complete removal was accomplished by biliary saw that sphincterotomy and balloon trawl.   ASSESSMENT  Amber Park is a 88 y.o. female with a pmh significant for hypertension, glaucoma, GERD, hiatal hernia, small bowel AVMs, colon cancer (status post resection).  Who presented with atypical chest pain found to have cholelithiasis and choledocholithiasis (now status post ERCP).  The patient is hemodynamically and clinically stable at this time.  She has no evidence of post ERCP pancreatitis or other complications.  She is doing and feeling well.  Her liver biochemical testing is downtrending.  I think she is ready from a GI standpoint for potential discharge as she has once again reiterated that she is not interested in a cholecystectomy at this time.  I did go over with her that if she has recurrent episodes of biliary colic or another bout of choledocholithiasis development, then certainly we should consider other routes of therapy.  Or consideration of ursodiol use in the future could be had.  I will work with my team this week to arrange follow-up LFTs in the next 1 to 2 weeks.  She already has a follow-up in clinic with me in September scheduled.  All patient questions were answered to the best of my ability, and the patient agrees to the aforementioned plan of action with follow-up as indicated.   PLAN/RECOMMENDATIONS  If patient remains in-house then repeat LFTs tomorrow I will arrange follow-up LFTs in 1 to 2 weeks as an outpatient Follow-up in clinic already scheduled in September with  me Holding on ursodiol use for now but could consider, especially if she remains steadfast that she does not want to undergo cholecystectomy Minimize NSAIDs for next 1 to 2 weeks as able   The inpatient GI service will sign off at this time but please page/call with questions or concerns.   Justice Britain, MD [facility] Office # [phone]    LOS: 0 days  Irving Copas  02/17/2022, 9:14 AM

## 2022-02-18 ENCOUNTER — Telehealth: Payer: Self-pay

## 2022-02-18 DIAGNOSIS — K8050 Calculus of bile duct without cholangitis or cholecystitis without obstruction: Secondary | ICD-10-CM

## 2022-02-18 NOTE — Telephone Encounter (Signed)
-----   Message from Irving Copas., MD sent at 02/17/2022  9:17 AM EDT ----- Regarding: Follow-up Amber Park, This patient will be discharged over the weekend. She needs a repeat set of LFTs in 1 to 2 weeks. She already has follow-up in clinic with me in September. Please reach out to the patient's daughter Baker Janus and she can work with helping arrange getting her mom to the lab. Thanks. GM

## 2022-02-18 NOTE — Telephone Encounter (Signed)
Lab order entered and pt aware to have labs in 1-2 weeks.

## 2022-02-20 LAB — SURGICAL PATHOLOGY

## 2022-02-22 ENCOUNTER — Encounter: Payer: Self-pay | Admitting: Gastroenterology

## 2022-02-22 NOTE — Progress Notes (Signed)
Patient's PCP to review results.

## 2022-02-25 ENCOUNTER — Other Ambulatory Visit (INDEPENDENT_AMBULATORY_CARE_PROVIDER_SITE_OTHER): Payer: Medicare Other

## 2022-02-25 DIAGNOSIS — K8050 Calculus of bile duct without cholangitis or cholecystitis without obstruction: Secondary | ICD-10-CM

## 2022-02-25 LAB — HEPATIC FUNCTION PANEL
ALT: 23 U/L (ref 0–35)
AST: 24 U/L (ref 0–37)
Albumin: 4.3 g/dL (ref 3.5–5.2)
Alkaline Phosphatase: 102 U/L (ref 39–117)
Bilirubin, Direct: 0.1 mg/dL (ref 0.0–0.3)
Total Bilirubin: 0.7 mg/dL (ref 0.2–1.2)
Total Protein: 7.2 g/dL (ref 6.0–8.3)

## 2022-03-06 DIAGNOSIS — I700 Atherosclerosis of aorta: Secondary | ICD-10-CM | POA: Diagnosis not present

## 2022-03-06 DIAGNOSIS — I252 Old myocardial infarction: Secondary | ICD-10-CM | POA: Diagnosis not present

## 2022-03-06 DIAGNOSIS — E876 Hypokalemia: Secondary | ICD-10-CM | POA: Diagnosis not present

## 2022-03-11 ENCOUNTER — Other Ambulatory Visit: Payer: Medicare Other

## 2022-03-11 DIAGNOSIS — C182 Malignant neoplasm of ascending colon: Secondary | ICD-10-CM

## 2022-03-12 ENCOUNTER — Other Ambulatory Visit: Payer: Self-pay | Admitting: Internal Medicine

## 2022-03-13 ENCOUNTER — Other Ambulatory Visit: Payer: Self-pay | Admitting: Internal Medicine

## 2022-03-28 ENCOUNTER — Encounter: Payer: Self-pay | Admitting: Gastroenterology

## 2022-03-28 ENCOUNTER — Ambulatory Visit: Payer: Medicare Other | Admitting: Gastroenterology

## 2022-03-28 VITALS — BP 126/66 | HR 69 | Ht 62.0 in | Wt 147.8 lb

## 2022-03-28 DIAGNOSIS — D508 Other iron deficiency anemias: Secondary | ICD-10-CM

## 2022-03-28 DIAGNOSIS — Z8639 Personal history of other endocrine, nutritional and metabolic disease: Secondary | ICD-10-CM

## 2022-03-28 DIAGNOSIS — Z85038 Personal history of other malignant neoplasm of large intestine: Secondary | ICD-10-CM

## 2022-03-28 DIAGNOSIS — Z9889 Other specified postprocedural states: Secondary | ICD-10-CM | POA: Diagnosis not present

## 2022-03-28 DIAGNOSIS — K8050 Calculus of bile duct without cholangitis or cholecystitis without obstruction: Secondary | ICD-10-CM

## 2022-03-28 DIAGNOSIS — K219 Gastro-esophageal reflux disease without esophagitis: Secondary | ICD-10-CM

## 2022-03-28 DIAGNOSIS — K5520 Angiodysplasia of colon without hemorrhage: Secondary | ICD-10-CM

## 2022-03-28 DIAGNOSIS — K449 Diaphragmatic hernia without obstruction or gangrene: Secondary | ICD-10-CM

## 2022-03-28 NOTE — Patient Instructions (Addendum)
_______________________________________________________  If you are age 86 or older, your body mass index should be between 23-30. Your Body mass index is 27.03 kg/m. If this is out of the aforementioned range listed, please consider follow up with your Primary Care Provider. ________________________________________________________  The [facility] providers would like to encourage you to use [facility] to communicate with providers for non-urgent requests or questions.  Due to long hold times on the telephone, sending your provider a message by [facility] may be a faster and more efficient way to get a response.  Please allow 48 business hours for a response.  Please remember that this is for non-urgent requests.  _______________________________________________________  Dennis Bast will need lab work at your next oncology appointment.  Orders for lab work are in the computer system.  You can stop Carafate in the next 1 to 2 weeks.    You will follow up in our office on an as needed basis.  Thank you for entrusting me with your care and choosing [facility].  Dr Rush Landmark

## 2022-03-29 ENCOUNTER — Encounter: Payer: Self-pay | Admitting: Gastroenterology

## 2022-03-29 DIAGNOSIS — Z9889 Other specified postprocedural states: Secondary | ICD-10-CM | POA: Insufficient documentation

## 2022-03-29 DIAGNOSIS — Z85038 Personal history of other malignant neoplasm of large intestine: Secondary | ICD-10-CM | POA: Insufficient documentation

## 2022-03-29 DIAGNOSIS — K219 Gastro-esophageal reflux disease without esophagitis: Secondary | ICD-10-CM | POA: Insufficient documentation

## 2022-03-29 NOTE — Progress Notes (Signed)
[place] VISIT   Primary Care Provider Lajean Manes, MD [address] [place] [address] [place] [address] [phone]  Patient Profile: Amber Park is a 88 y.o. 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  The patient presents to the [facility] for an evaluation and management of problem(s) noted below:  Problem List 1. Choledocholithiasis   2. History of ERCP   3. History of colon cancer   4. History of iron deficiency   5. GERD without esophagitis   6. Hiatal hernia   7. AVM (arteriovenous malformation) of small bowel, acquired     History of Present Illness Please see prior notes for full details of HPI.  Interval History The patient returns for follow-up with one of her daughters today.  I have not seen the patient for many years after we diagnosed her colon cancer and she underwent resection of that.  That was good news since she had been doing well.  Unfortunately she ended up being hospitalized recently with abdominal and chest discomfort.  She was found to have choledocholithiasis.  I came on service and ended up performing her ERCP for stone extraction after biliary sphincterotomy.  She did well and was able to be discharged.  Repeat LFTs were checked as an outpatient and she has normalized her liver tests.  Her gallbladder was not removed due to her overall age and with imaging suggestive of not having overt cholelithiasis.  The patient is doing well.  She is going to have her 89th birthday next week.  She still has issues of fatigue that occurs at times but she is  not experiencing GI issues.  She is hesitant about undergoing any repeat colonoscopies even with the setting of her known colon cancer diagnosis and prior surgical resection.  She is willing to consider this if other things develop or if iron deficiency becomes an issue again.    GI Review of Systems Positive as above Negative for odynophagia, dysphagia, nausea, vomiting, pain, alteration of bowel habits, melena, hematochezia   Review of Systems General: Denies fevers/chills/weight loss unintentionally Cardiovascular: Denies chest pain Pulmonary: Denies shortness of breath Gastroenterological: See HPI Genitourinary: Denies darkened urine Dermatological: Denies jaundice Psychological: Stable   Medications Current Outpatient Medications  Medication Sig Dispense Refill   acetaminophen (TYLENOL) 500 MG tablet Take 500 mg by mouth daily as needed for mild pain.     amLODipine (NORVASC) 10 MG tablet Take 10 mg by mouth daily.     aspirin EC 81 MG tablet Take 1 tablet (81 mg total) by mouth daily. Swallow whole. 30 tablet 12   Cholecalciferol (VITAMIN D3) 50 MCG (2000 UT) capsule Take 2,000 Units by mouth daily.     hydrochlorothiazide (HYDRODIURIL) 25 MG tablet Take 25 mg by mouth daily.     latanoprost (XALATAN) 0.005 % ophthalmic solution Place 1 drop into both eyes at bedtime.      pantoprazole (PROTONIX) 40 MG tablet TAKE 1 TABLET(40 MG) BY MOUTH DAILY (Patient taking differently: Take 40 mg by mouth daily.) 90 tablet 1   potassium chloride (KLOR-CON) 10 MEQ tablet Take 10 mEq by mouth daily.     sucralfate (CARAFATE) 1 GM/10ML suspension Take 10 mLs (1 g total) by mouth  2 (two) times daily. 420 mL 0   tiZANidine (ZANAFLEX) 4 MG tablet Take 1 tablet (4 mg total) by mouth every 8 (eight) hours as needed for muscle spasms. (Patient taking differently: Take 4 mg by mouth at bedtime.) 30 tablet 0   metoprolol tartrate (LOPRESSOR) 25 MG tablet Take 1 tablet (25 mg total) by mouth 2 (two) times  daily. 60 tablet 0   No current facility-administered medications for this visit.    Allergies Allergies  Allergen Reactions   Ferrous Gluconate     Back and chest pain, nausea, diarrhea, chills, hypotension, presyncope   Anesthesia S-I-40 [Propofol] Nausea And Vomiting    Anesthetics -- N/V   Codeine Nausea And Vomiting   Meperidine Hcl Nausea And Vomiting   Pneumococcal Vac Polyvalent     Other reaction(s): red and swollen   Simvastatin     Other reaction(s): muscle pain    Histories Past Medical History:  Diagnosis Date   Anemia    iron deficiency   Anxiety    Arthritis    AVM (arteriovenous malformation)    colon, stomach and bowel ( per progress note)   Cancer (HCC)    hx of skin cancer removed from nose    Family history of adverse reaction to anesthesia    Pt son has PONV   GERD (gastroesophageal reflux disease)    Glaucoma    Hearing aid worn    B/L   History of blood transfusion 1963   History of hiatal hernia    History of left bundle branch block    old ekg 06-23-11 dr Felipa Eth on chart   HOH (hard of hearing)    Hypertension    Pneumonia    hx of pneumonia x 4    PONV (postoperative nausea and vomiting)    Shortness of breath dyspnea    with exertion    Wears glasses    Past Surgical History:  Procedure Laterality Date   ABDOMINAL HYSTERECTOMY     BACK SURGERY     x 2   BIOPSY  01/03/2020   Procedure: BIOPSY;  Surgeon: Irving Copas., MD;  Location: [place];  Service: Gastroenterology;;   BIOPSY  02/16/2022   Procedure: BIOPSY;  Surgeon: Irving Copas., MD;  Location: [facility];  Service: Gastroenterology;;   BREAST SURGERY     bilateral cyst removal    COLONOSCOPY WITH PROPOFOL N/A 01/03/2020   Procedure: COLONOSCOPY WITH PROPOFOL;  Surgeon: Irving Copas., MD;  Location: [facility];  Service: Gastroenterology;  Laterality: N/A;   ENTEROSCOPY N/A 09/16/2018   Procedure: ENTEROSCOPY;  Surgeon: Rush Landmark  Telford Nab., MD;  Location: [place];  Service: Gastroenterology;  Laterality: N/A;   ENTEROSCOPY N/A 01/03/2020   Procedure: ENTEROSCOPY;  Surgeon: Rush Landmark Telford Nab., MD;  Location: [place];  Service: Gastroenterology;  Laterality: N/A;   ERCP N/A 02/16/2022   Procedure: ENDOSCOPIC RETROGRADE CHOLANGIOPANCREATOGRAPHY (ERCP);  Surgeon: Irving Copas., MD;  Location: [place];  Service: Gastroenterology;  Laterality: N/A;   HOT HEMOSTASIS N/A 09/16/2018   Procedure: HOT HEMOSTASIS (ARGON PLASMA COAGULATION/BICAP);  Surgeon: Irving Copas., MD;  Location: [place];  Service: Gastroenterology;  Laterality: N/A;   LAPAROSCOPIC PARTIAL COLECTOMY N/A 02/10/2020   Procedure: LAPAROSCOPIC PARTIAL COLECTOMY;  Surgeon: Leighton Ruff, MD;  Location: [facility];  Service: General;  Laterality: N/A;   LEFT HEART CATH AND CORONARY ANGIOGRAPHY N/A 02/13/2022   Procedure: LEFT HEART CATH AND CORONARY ANGIOGRAPHY;  Surgeon: Leonie Man, MD;  Location: [facility];  Service: Cardiovascular;  Laterality: N/A;   POLYPECTOMY  01/03/2020   Procedure: POLYPECTOMY;  Surgeon: Rush Landmark Telford Nab., MD;  Location: [place];  Service: Gastroenterology;;   REMOVAL OF STONES  02/16/2022   Procedure: REMOVAL OF STONES;  Surgeon: Irving Copas., MD;  Location: [place];  Service: Gastroenterology;;   Joan Mayans  02/16/2022   Procedure: Joan Mayans;  Surgeon: Irving Copas., MD;  Location: [place];  Service: Gastroenterology;;   SUBMUCOSAL TATTOO INJECTION  01/03/2020   Procedure: SUBMUCOSAL TATTOO INJECTION;  Surgeon: Irving Copas., MD;  Location: [place];  Service: Gastroenterology;;   TONSILLECTOMY     TOTAL HIP ARTHROPLASTY Left 05/31/2014   Procedure: LEFT TOTAL HIP ARTHROPLASTY ANTERIOR APPROACH;  Surgeon: Mauri Pole, MD;  Location: [facility];  Service: Orthopedics;  Laterality: Left;   Social History   Socioeconomic  History   Marital status: Widowed    Spouse name: Not on file   Number of children: 3   Years of education: Not on file   Highest education level: Not on file  Occupational History   Occupation: Retired Designer, multimedia at [employer] Use   Smoking status: Former    Packs/day: 0.30    Types: Cigarettes    Start date: 1955    Quit date: 2005    Years since quitting: 18.6   Smokeless tobacco: Never  Vaping Use   Vaping Use: Never used  Substance and Sexual Activity   Alcohol use: Not Currently    Comment: rare    Drug use: No   Sexual activity: Not on file  Other Topics Concern   Not on file  Social History Narrative   Not on file   Social Determinants of Health   Financial Resource Strain: Not on file  Food Insecurity: Not on file  Transportation Needs: Not on file  Physical Activity: Not on file  Stress: Not on file  Social Connections: Not on file  Intimate Partner Violence: Not on file   Family History  Problem Relation Age of Onset   Colon cancer Mother 26       Died age 79   Heart attack Father 33       Died in his sleep of an MI   Lung cancer Sister        LLL resection, otherwise no treatment; now recurred   Bladder Cancer Sister        Likely primary   Renal cancer Sister    Breast cancer Sister    Heart disease Brother    Heart disease Brother    Esophageal cancer Neg Hx    Inflammatory bowel disease Neg Hx    Liver disease Neg Hx    Pancreatic cancer Neg Hx    Stomach cancer Neg Hx    I have reviewed her medical, social, and family history in detail and updated the electronic medical record as necessary.    PHYSICAL EXAMINATION  BP 126/66   Pulse 69   Ht '5\' 2"'$  (1.575 m)   Wt 147 lb 12.8 oz (67 kg)   SpO2 97%   BMI 27.03 kg/m  GEN: NAD, appears stated age, doesn't appear chronically ill, accompanied by daughter PSYCH: Cooperative, without pressured speech EYE: Conjunctivae pink, sclerae anicteric ENT: MMM CV:  Nontachycardic RESP: Audible wheezing GI: NABS, soft, NT/ND, without rebound MSK/EXT: No significant lower extremity edema SKIN: No jaundice NEURO:  Alert & Oriented x 3, no focal deficits  REVIEW OF DATA  I reviewed the following data at the time of this encounter:  GI Procedures and Studies  July 2023 ERCP - Z-line regular, 33 cm from the incisors. - 5 cm hiatal hernia. - Erosive gastropathy with no bleeding and no stigmata of recent bleeding in the body. No other gross lesions in the stomach. - No gross lesions in the duodenal bulb, in the first portion of the duodenum and in the second portion of the duodenum. - The major papilla appeared normal. - A filling defect consistent with a stone and sludge was seen on the cholangiogram. - Choledocholithiasis was found. Complete removal was accomplished by biliary sphincterotomy and balloon trawl.  Pathology FINAL MICROSCOPIC DIAGNOSIS:  A.   STOMACH, BIOPSY:  -    Active chronic gastritis, erosive, involving oxyntic mucosa.  -    Helicobacter pylori immunohistochemical (IHC) stain negative for  organisms, with adequate controls.  -    Negative for intestinal metaplasia and malignancy.   June 2021 colonoscopy - Hemorrhoids found on digital rectal exam. - The examined portion of the ileum was normal. - Rule out malignancy, tumor in the proximal ascending colon/cecum. Biopsied. - One 4 mm polyp in the transverse colon, removed with a cold snare. Resected and retrieved. - Diverticulosis in the recto-sigmoid colon, in the sigmoid colon and in the descending colon. - Normal mucosa in the entire examined colon otherwise. - Non-bleeding non-thrombosed external and internal hemorrhoids.  Pathology FINAL MICROSCOPIC DIAGNOSIS:  A. STOMACH, ANTRUM, BIOPSY:  -  Reactive gastropathy  -  No H. pylori or intestinal metaplasia identified  -  See comment  B. COLON, ASCENDING/CECAL MASS, BIOPSY:  -  Adenocarcinoma  -  See comment  C.  COLON, TRANSVERSE, POLYPECTOMY:  -  Tubular adenoma (1 of 1 fragments)  -  No high grade dysplasia or malignancy identified  COMMENT:  A.  Warthin-Starry stain is NEGATIVE for organisms morphologically  consistent with Helicobacter pylori.  B.  Based on the biopsy, the carcinoma appears moderate to poorly  differentiated.  Laboratory Studies  Reviewed in epic  Imaging Studies  July 2023 MRCP IMPRESSION: 1. No evidence acute pancreatitis. No variant pancreatic ductal anatomy. 2. Small stones versus debris within distal common bile duct. No biliary duct dilatation. 3. No cholelithiasis identified.  No evidence acute cholecystitis. 4. Large sliding-type hiatal hernia.   ASSESSMENT  Ms. Ard is a 88 y.o. 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  The patient is seen today for evaluation and management of:  1. Choledocholithiasis   2. History of ERCP   3. History of colon cancer   4. History of iron deficiency   5. GERD without esophagitis   6. Hiatal hernia   7. AVM (arteriovenous malformation) of small bowel, acquired    The patient is hemodynamically and clinically stable.  She has done well status post ERCP with sphincterotomy.  As she did not have significant amount of cholelithiasis on imaging, hopefully she will not have any other issues from a gallbladder standpoint.  Certainly at her age it is hard to imagine undergoing an elective cholecystectomy but she did undergo an elective colectomy and has done quite well.  Hopefully my sphincterotomy site will prevent further issues in the future but  if she has another  episode of choledocholithiasis we will need to consider surgical management.  Endoscopic management via AXIOS stenting to the gallbladder could also be considered but I would hold that unless she had significant increased burden of cholelithiasis to develop on imaging.  Time will tell.  She is hesitant about undergoing a repeat colonoscopy for surveillance and I can understand that based on her age.  If she were to develop recurrent anemia or iron deficiency, I would have a low threshold to offer her a repeat colonoscopy.  She will be seeing oncology in the coming months.  She will have repeat labs performed as well as iron indices have placed those in the future standing.  I will see the patient on an as-needed basis moving forward but happy to be available for any endoscopic procedures that could be required should other issues develop.  I wish her the very best and a very happy birthday next week at 88 years of age.  All patient questions were answered to the best of my ability, and the patient agrees to the aforementioned plan of action with follow-up as indicated.   PLAN  Continue PPI daily May stop Carafate Holding on cholecystectomy due to patient's age and low cholelithiasis burden based on imaging - This could change in the future if she has recurrent choledocholithiasis Repeat LFTs as well as iron indices in a few months time at oncology visit Should anemia or iron deficiency recur in future, will have low threshold to consider surveillance colonoscopy being completed Patient and family agreed to hold on colonoscopy, understanding that recurrent colon cancer could be missed due to the patient's age and other comorbidities at this time. Continue fiber supplementation MiraLAX as needed/Dulcolax as needed   Orders Placed This Encounter  Procedures   CBC   Hepatic function panel   IBC panel    New Prescriptions   No medications on file   Modified Medications   No medications on file     Planned Follow Up No follow-ups on file.   Total Time in Face-to-Face and in Coordination of Care for patient including independent/personal interpretation/review of prior testing, medical history, examination, medication adjustment, communicating results with the patient directly, and documentation with the EHR is 25 minutes.   Justice Britain, MD [facility] Office # [phone]

## 2022-06-24 ENCOUNTER — Other Ambulatory Visit: Payer: Self-pay | Admitting: Gastroenterology

## 2022-06-25 DIAGNOSIS — Z6828 Body mass index (BMI) 28.0-28.9, adult: Secondary | ICD-10-CM | POA: Diagnosis not present

## 2022-06-25 DIAGNOSIS — I129 Hypertensive chronic kidney disease with stage 1 through stage 4 chronic kidney disease, or unspecified chronic kidney disease: Secondary | ICD-10-CM | POA: Diagnosis not present

## 2022-07-02 IMAGING — DX DG CHEST 1V PORT
1 series · 1 of 1 positions shown · non-contrast
Comparison: CT chest dated January 11, 2020. Chest x-ray dated
09/14/2018

CLINICAL DATA: Syncope.

EXAM:
PORTABLE CHEST 1 VIEW

[chest ap]
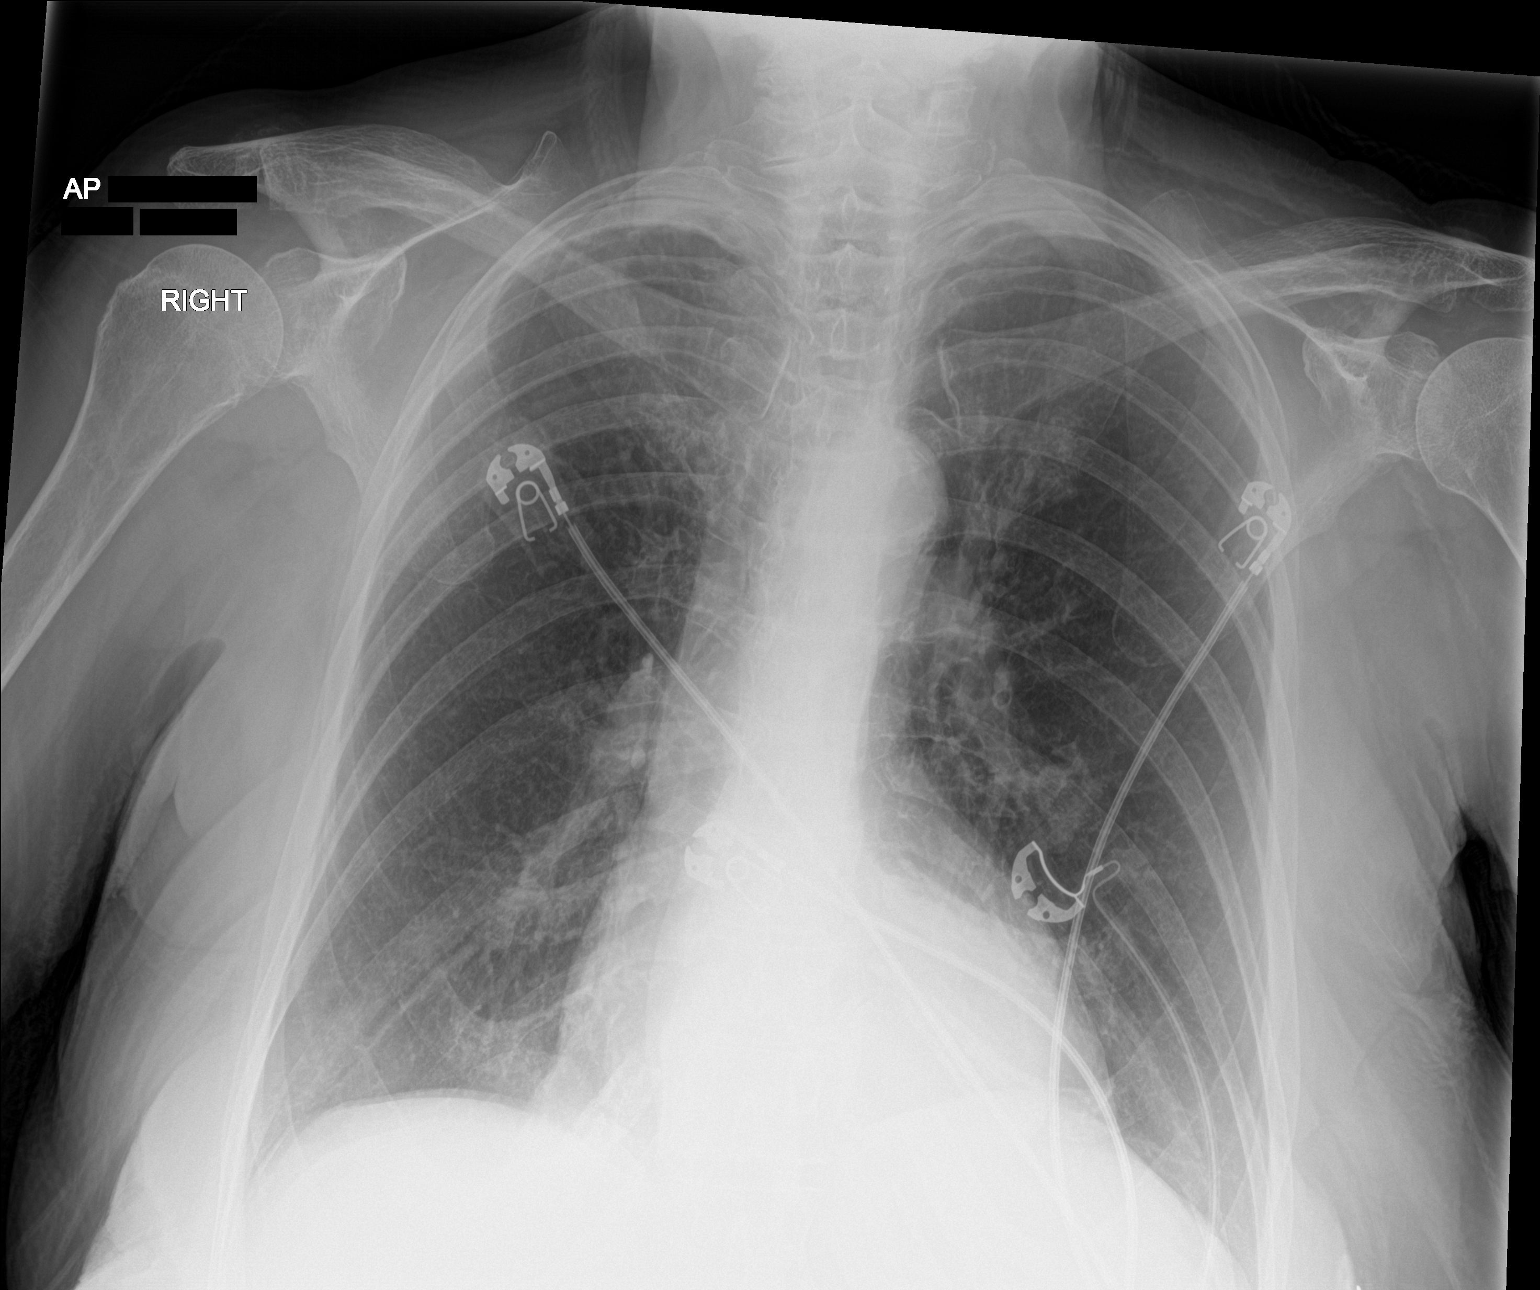

[1 of 1 positions shown; findings below may reference images not displayed]

FINDINGS: There is increased density overlying the bilateral lower lung zones
which is favored to be secondary to breast tissue attenuation
artifact as opposed to bilateral pleural effusions. There is a
hiatal hernia. There is no pneumothorax or large focal infiltrate.
Aortic calcifications are noted. The heart size is unremarkable.
IMPRESSION: No active disease.

## 2022-07-03 DIAGNOSIS — H40053 Ocular hypertension, bilateral: Secondary | ICD-10-CM | POA: Diagnosis not present

## 2022-07-23 ENCOUNTER — Encounter: Payer: Self-pay | Admitting: Nurse Practitioner

## 2022-07-23 ENCOUNTER — Other Ambulatory Visit (INDEPENDENT_AMBULATORY_CARE_PROVIDER_SITE_OTHER): Payer: BLUE CROSS/BLUE SHIELD

## 2022-07-23 DIAGNOSIS — Z85038 Personal history of other malignant neoplasm of large intestine: Secondary | ICD-10-CM

## 2022-07-23 DIAGNOSIS — K8050 Calculus of bile duct without cholangitis or cholecystitis without obstruction: Secondary | ICD-10-CM

## 2022-07-23 DIAGNOSIS — Z8639 Personal history of other endocrine, nutritional and metabolic disease: Secondary | ICD-10-CM | POA: Diagnosis not present

## 2022-07-23 LAB — IBC PANEL
Iron: 84 ug/dL (ref 42–145)
Saturation Ratios: 24.3 % (ref 20.0–50.0)
TIBC: 345.8 ug/dL (ref 250.0–450.0)
Transferrin: 247 mg/dL (ref 212.0–360.0)

## 2022-07-23 LAB — HEPATIC FUNCTION PANEL
ALT: 12 U/L (ref 0–35)
AST: 24 U/L (ref 0–37)
Albumin: 4.2 g/dL (ref 3.5–5.2)
Alkaline Phosphatase: 84 U/L (ref 39–117)
Bilirubin, Direct: 0 mg/dL (ref 0.0–0.3)
Total Bilirubin: 0.7 mg/dL (ref 0.2–1.2)
Total Protein: 7.2 g/dL (ref 6.0–8.3)

## 2022-07-23 LAB — CBC
HCT: 44.3 % (ref 36.0–46.0)
Hemoglobin: 15 g/dL (ref 12.0–15.0)
MCHC: 34 g/dL (ref 30.0–36.0)
MCV: 84.1 fl (ref 78.0–100.0)
Platelets: 279 10*3/uL (ref 150.0–400.0)
RBC: 5.26 Mil/uL — ABNORMAL HIGH (ref 3.87–5.11)
RDW: 13.4 % (ref 11.5–15.5)
WBC: 7.1 10*3/uL (ref 4.0–10.5)

## 2022-08-02 DIAGNOSIS — J069 Acute upper respiratory infection, unspecified: Secondary | ICD-10-CM | POA: Diagnosis not present

## 2022-08-02 DIAGNOSIS — U071 COVID-19: Secondary | ICD-10-CM | POA: Diagnosis not present

## 2022-08-07 ENCOUNTER — Inpatient Hospital Stay: Payer: BLUE CROSS/BLUE SHIELD | Admitting: Oncology

## 2022-08-07 ENCOUNTER — Inpatient Hospital Stay: Payer: BLUE CROSS/BLUE SHIELD

## 2022-08-14 DIAGNOSIS — N1832 Chronic kidney disease, stage 3b: Secondary | ICD-10-CM | POA: Diagnosis not present

## 2022-08-14 DIAGNOSIS — I700 Atherosclerosis of aorta: Secondary | ICD-10-CM | POA: Diagnosis not present

## 2022-08-14 DIAGNOSIS — I129 Hypertensive chronic kidney disease with stage 1 through stage 4 chronic kidney disease, or unspecified chronic kidney disease: Secondary | ICD-10-CM | POA: Diagnosis not present

## 2022-08-14 DIAGNOSIS — N3281 Overactive bladder: Secondary | ICD-10-CM | POA: Diagnosis not present

## 2022-08-22 ENCOUNTER — Encounter: Payer: Self-pay | Admitting: Nurse Practitioner

## 2022-08-26 ENCOUNTER — Encounter: Payer: Self-pay | Admitting: Nurse Practitioner

## 2022-08-26 DIAGNOSIS — H524 Presbyopia: Secondary | ICD-10-CM | POA: Diagnosis not present

## 2022-08-30 ENCOUNTER — Other Ambulatory Visit: Payer: Self-pay | Admitting: *Deleted

## 2022-08-30 DIAGNOSIS — C182 Malignant neoplasm of ascending colon: Secondary | ICD-10-CM

## 2022-09-02 ENCOUNTER — Inpatient Hospital Stay: Payer: Medicare Other | Attending: Oncology | Admitting: Oncology

## 2022-09-02 ENCOUNTER — Encounter: Payer: Self-pay | Admitting: *Deleted

## 2022-09-02 ENCOUNTER — Inpatient Hospital Stay: Payer: Medicare Other

## 2022-09-02 VITALS — BP 145/62 | HR 62 | Temp 98.1°F | Resp 18 | Ht 62.0 in | Wt 149.0 lb

## 2022-09-02 DIAGNOSIS — C182 Malignant neoplasm of ascending colon: Secondary | ICD-10-CM | POA: Diagnosis not present

## 2022-09-02 DIAGNOSIS — H409 Unspecified glaucoma: Secondary | ICD-10-CM | POA: Insufficient documentation

## 2022-09-02 DIAGNOSIS — Z85038 Personal history of other malignant neoplasm of large intestine: Secondary | ICD-10-CM | POA: Insufficient documentation

## 2022-09-02 DIAGNOSIS — K449 Diaphragmatic hernia without obstruction or gangrene: Secondary | ICD-10-CM | POA: Diagnosis not present

## 2022-09-02 DIAGNOSIS — D508 Other iron deficiency anemias: Secondary | ICD-10-CM | POA: Diagnosis not present

## 2022-09-02 DIAGNOSIS — I10 Essential (primary) hypertension: Secondary | ICD-10-CM | POA: Diagnosis not present

## 2022-09-02 LAB — CBC WITH DIFFERENTIAL/PLATELET
Abs Immature Granulocytes: 0.03 10*3/uL (ref 0.00–0.07)
Basophils Absolute: 0.1 10*3/uL (ref 0.0–0.1)
Basophils Relative: 1 %
Eosinophils Absolute: 0.2 10*3/uL (ref 0.0–0.5)
Eosinophils Relative: 2 %
HCT: 44.2 % (ref 36.0–46.0)
Hemoglobin: 15 g/dL (ref 12.0–15.0)
Immature Granulocytes: 0 %
Lymphocytes Relative: 20 %
Lymphs Abs: 1.6 10*3/uL (ref 0.7–4.0)
MCH: 28.1 pg (ref 26.0–34.0)
MCHC: 33.9 g/dL (ref 30.0–36.0)
MCV: 82.9 fL (ref 80.0–100.0)
Monocytes Absolute: 0.7 10*3/uL (ref 0.1–1.0)
Monocytes Relative: 10 %
Neutro Abs: 5.2 10*3/uL (ref 1.7–7.7)
Neutrophils Relative %: 67 %
Platelets: 276 10*3/uL (ref 150–400)
RBC: 5.33 MIL/uL — ABNORMAL HIGH (ref 3.87–5.11)
RDW: 13.3 % (ref 11.5–15.5)
WBC: 7.8 10*3/uL (ref 4.0–10.5)
nRBC: 0 % (ref 0.0–0.2)

## 2022-09-02 LAB — CEA (ACCESS): CEA (CHCC): 1.45 ng/mL (ref 0.00–5.00)

## 2022-09-02 NOTE — Progress Notes (Signed)
  [facility] OFFICE PROGRESS NOTE   Diagnosis: Colon cancer  INTERVAL HISTORY:   Amber Park returns as scheduled.  She generally feels well.  Good appetite.  Decreased energy level.  No bleeding.  She has intermittent loose stool up to 5-6 times per day.  She takes Imodium with partial improvement.  She was admitted in July 2023 with choledocholithiasis.  She underwent an ERCP and stone removal on 02/16/2022.  The elevated liver enzymes normalized.   Objective:  Vital signs in last 24 hours:  Blood pressure (!) 145/62, pulse 62, temperature 98.1 F (36.7 C), temperature source Oral, resp. rate 18, height '5\' 2"'$  (1.575 m), weight 149 lb (67.6 kg), SpO2 98 %.    Lymphatics: No cervical, supraclavicular, axillary, or inguinal nodes Resp: Lungs with scattered end inspiratory rhonchi, no respiratory distress Cardio: Regular rate and rhythm GI: No hepatosplenomegaly, no mass, nontender Vascular: No leg edema  Lab Results:  Lab Results  Component Value Date   WBC 7.8 09/02/2022   HGB 15.0 09/02/2022   HCT 44.2 09/02/2022   MCV 82.9 09/02/2022   PLT 276 09/02/2022   NEUTROABS 5.2 09/02/2022    CMP  Lab Results  Component Value Date   NA 136 02/17/2022   K 4.2 02/17/2022   CL 105 02/17/2022   CO2 23 02/17/2022   GLUCOSE 124 (H) 02/17/2022   BUN 22 02/17/2022   CREATININE 1.28 (H) 02/17/2022   CALCIUM 9.0 02/17/2022   PROT 7.2 07/23/2022   ALBUMIN 4.2 07/23/2022   AST 24 07/23/2022   ALT 12 07/23/2022   ALKPHOS 84 07/23/2022   BILITOT 0.7 07/23/2022   GFRNONAA 39 (L) 02/17/2022   GFRAA 60 (L) 02/13/2020    Lab Results  Component Value Date   CEA1 1.78 12/04/2020   CEA 1.45 09/02/2022     Medications: I have reviewed the patient's current medications.   Assessment/Plan:  Colon cancer-stage IIIb Cecal mass on colonoscopy 01/03/2020, biopsy confirmed adenocarcinoma CTs 01/11/2020-cecal mass, prominent subcentimeter mesenteric nodes adjacent to the  cecum, multiple tiny pulmonary nodules-metastases not favored Laparoscopic right colectomy 02/10/2020,pT3pN1a, grade 2-grade 3, 1/27 lymph nodes, 1 tumor deposit, MSI-high, loss of MLH1 and PMS 2 expression; MLH1 methylation detected CTs 02/05/2021-no evidence of recurrent disease, stable tiny upper lobe pulmonary nodules considered benign CTs 02/12/2022-nonspecific lymphadenopathy in the mesentery with mild mesenteric stranding, largest nodes measure up to 8 mm with no evidence of metastatic disease in the chest 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 and history of AVMs/Cameron's erosions Ferrous gluconate 07/24/2020-allergic/infusion reaction with chest and back pain, diarrhea, nausea/vomiting, chills, presyncope, hypotension Feraheme 08/14/2020 and 08/21/2020-tolerated well Hiatal hernia Family history of colon and bladder cancer Transverse colon tubular adenoma on the colonoscopy 01/03/2020 Hypertension Glaucoma Admission 02/12/2022 with choledocholithiasis, status post ERCP and stone removal     Disposition: Amber Park remains in clinical remission from colon cancer.  There were mildly enlarged mesenteric nodes on a CT 02/12/2022 when she was hospitalized with choledocholithiasis.  The CEA is normal and there is no clinical evidence of recurrent colon cancer.  She will be scheduled for surveillance CTs prior to an office visit in June.  Amber Park will try oral ferrous sulfate for the frequent bowel movements.  Betsy Coder, MD  09/02/2022  12:04 PM

## 2022-11-28 ENCOUNTER — Other Ambulatory Visit: Payer: Self-pay | Admitting: Gastroenterology

## 2022-12-31 DIAGNOSIS — I129 Hypertensive chronic kidney disease with stage 1 through stage 4 chronic kidney disease, or unspecified chronic kidney disease: Secondary | ICD-10-CM | POA: Diagnosis not present

## 2022-12-31 DIAGNOSIS — Z0000 Encounter for general adult medical examination without abnormal findings: Secondary | ICD-10-CM | POA: Diagnosis not present

## 2022-12-31 DIAGNOSIS — K219 Gastro-esophageal reflux disease without esophagitis: Secondary | ICD-10-CM | POA: Diagnosis not present

## 2022-12-31 DIAGNOSIS — N1832 Chronic kidney disease, stage 3b: Secondary | ICD-10-CM | POA: Diagnosis not present

## 2023-01-01 DIAGNOSIS — H40053 Ocular hypertension, bilateral: Secondary | ICD-10-CM | POA: Diagnosis not present

## 2023-01-01 DIAGNOSIS — Z961 Presence of intraocular lens: Secondary | ICD-10-CM | POA: Diagnosis not present

## 2023-01-06 DIAGNOSIS — K080 Exfoliation of teeth due to systemic causes: Secondary | ICD-10-CM | POA: Diagnosis not present

## 2023-01-13 ENCOUNTER — Inpatient Hospital Stay: Payer: Medicare Other | Attending: Oncology

## 2023-01-13 ENCOUNTER — Ambulatory Visit (HOSPITAL_BASED_OUTPATIENT_CLINIC_OR_DEPARTMENT_OTHER)
Admission: RE | Admit: 2023-01-13 | Discharge: 2023-01-13 | Disposition: A | Discharge status: Home / Self Care | Payer: Medicare Other | Source: Ambulatory Visit | Attending: Oncology | Admitting: Oncology

## 2023-01-13 DIAGNOSIS — K5520 Angiodysplasia of colon without hemorrhage: Secondary | ICD-10-CM | POA: Diagnosis not present

## 2023-01-13 DIAGNOSIS — I700 Atherosclerosis of aorta: Secondary | ICD-10-CM | POA: Diagnosis not present

## 2023-01-13 DIAGNOSIS — C182 Malignant neoplasm of ascending colon: Secondary | ICD-10-CM

## 2023-01-13 DIAGNOSIS — K449 Diaphragmatic hernia without obstruction or gangrene: Secondary | ICD-10-CM | POA: Diagnosis not present

## 2023-01-13 DIAGNOSIS — Z85038 Personal history of other malignant neoplasm of large intestine: Secondary | ICD-10-CM | POA: Diagnosis not present

## 2023-01-13 DIAGNOSIS — D508 Other iron deficiency anemias: Secondary | ICD-10-CM | POA: Diagnosis not present

## 2023-01-13 DIAGNOSIS — C189 Malignant neoplasm of colon, unspecified: Secondary | ICD-10-CM | POA: Diagnosis not present

## 2023-01-13 LAB — CEA (ACCESS): CEA (CHCC): 1.13 ng/mL (ref 0.00–5.00)

## 2023-01-14 ENCOUNTER — Inpatient Hospital Stay: Payer: Medicare Other | Admitting: Oncology

## 2023-01-14 VITALS — BP 151/62 | HR 70 | Temp 97.9°F | Resp 18 | Ht 62.0 in | Wt 147.2 lb

## 2023-01-14 DIAGNOSIS — C182 Malignant neoplasm of ascending colon: Secondary | ICD-10-CM

## 2023-01-14 DIAGNOSIS — D508 Other iron deficiency anemias: Secondary | ICD-10-CM | POA: Diagnosis not present

## 2023-01-14 DIAGNOSIS — K5520 Angiodysplasia of colon without hemorrhage: Secondary | ICD-10-CM | POA: Diagnosis not present

## 2023-01-14 DIAGNOSIS — Z85038 Personal history of other malignant neoplasm of large intestine: Secondary | ICD-10-CM | POA: Diagnosis not present

## 2023-01-14 IMAGING — CT CT CHEST-ABD-PELV W/O CM
2 of 4 series · 14 of 46 positions shown, 16 images · non-contrast
Comparison: 01/11/2020 CT chest, abdomen and pelvis.

CLINICAL DATA: Right colon cancer diagnosed December 2019. Restaging.
History of pulmonary nodules.

EXAM:
CT CHEST, ABDOMEN AND PELVIS WITHOUT CONTRAST
TECHNIQUE: Multidetector CT imaging of the chest, abdomen and pelvis was
performed following the standard protocol without IV contrast.

[Series 2: cap without · axial · non-contrast · 0.78mm/px · z∈[+1107,+1617]mm · 11 of 120 slices shown, 13 images]
[im 9/120  soft-tissue]
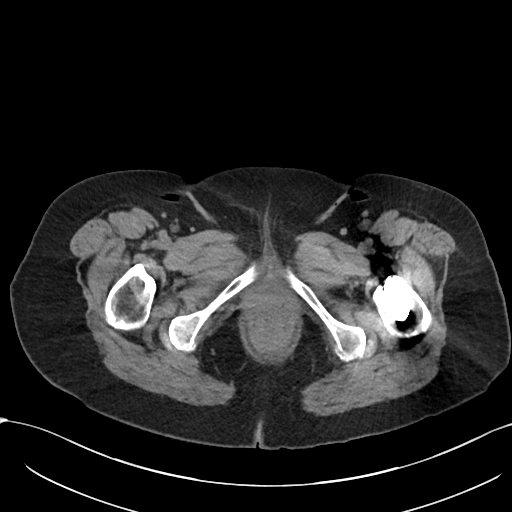
[im 9/120  bone]
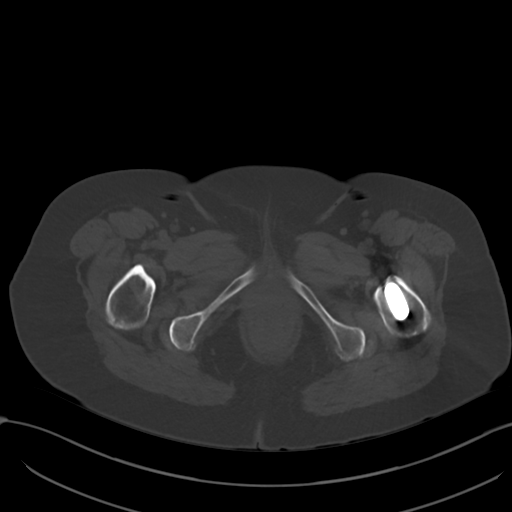
[im 18/120  soft-tissue]
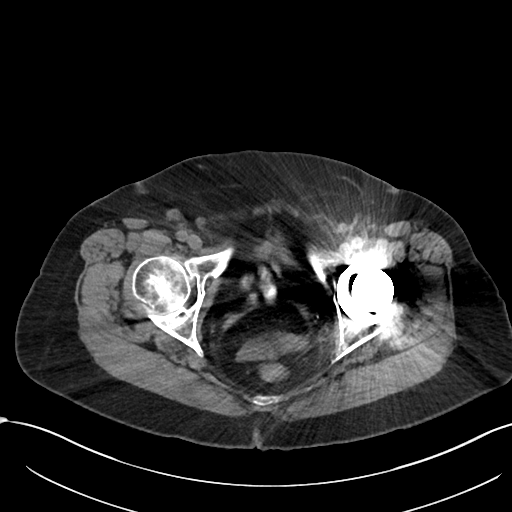
[im 26/120  soft-tissue]
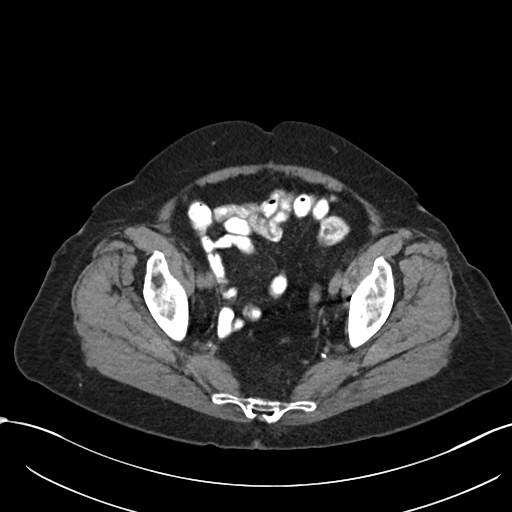
[im 43/120  soft-tissue]
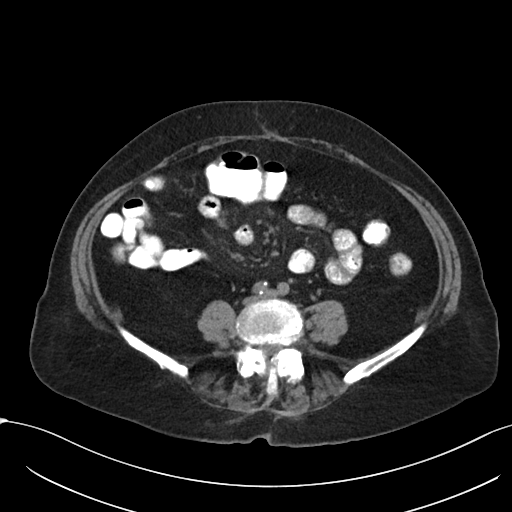
[im 52/120  soft-tissue]
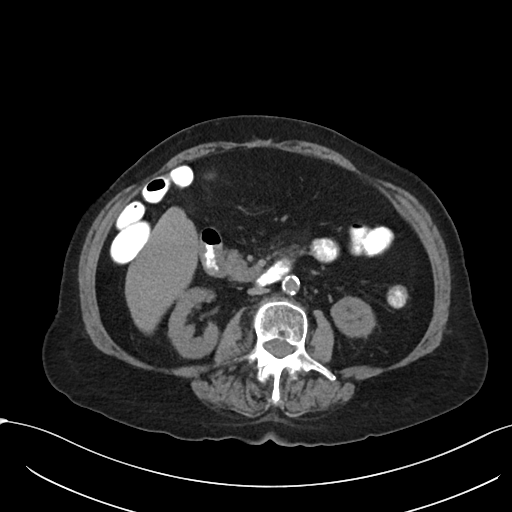
[im 60/120  soft-tissue]
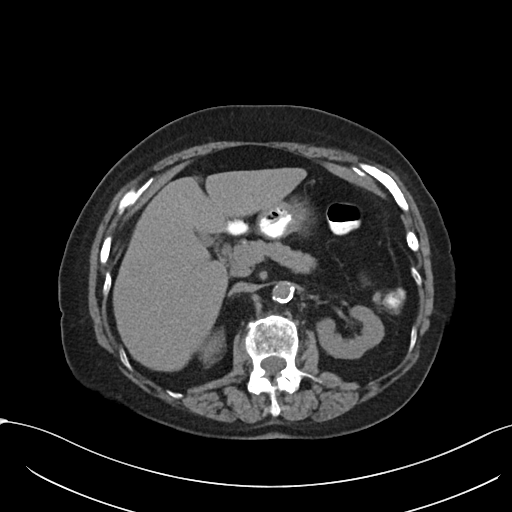
[im 69/120  soft-tissue]
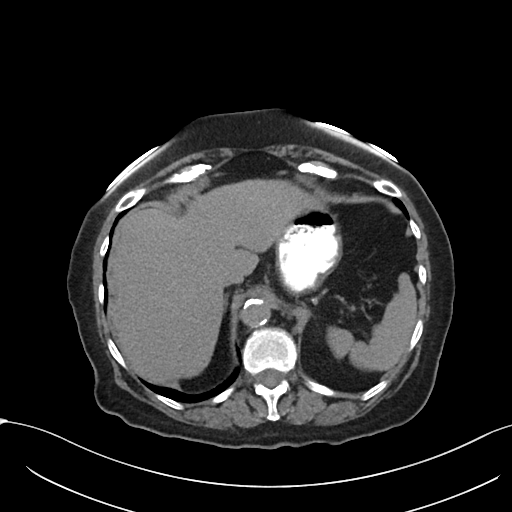
[im 77/120  soft-tissue]
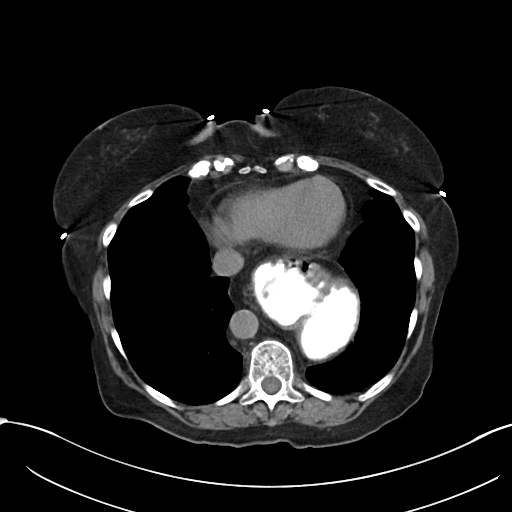
[im 94/120  soft-tissue]
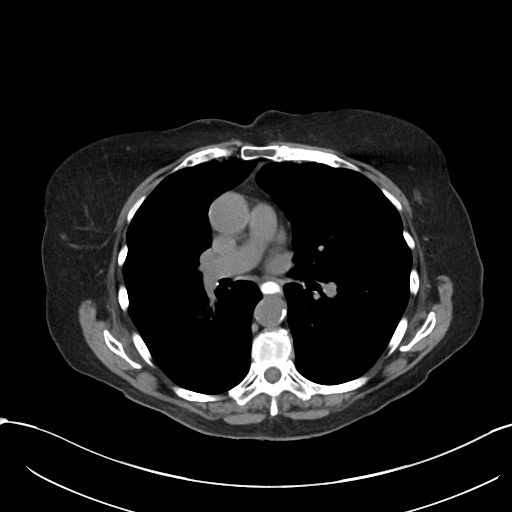
[im 94/120  bone]
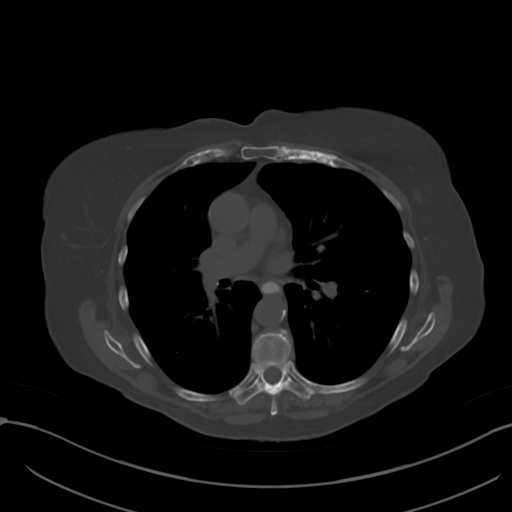
[im 103/120  soft-tissue]
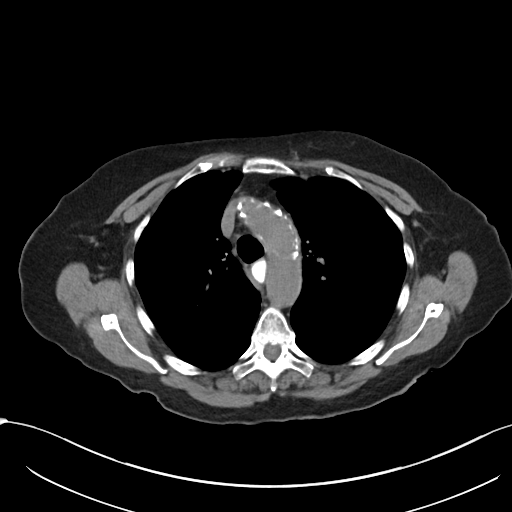
[im 111/120  soft-tissue]
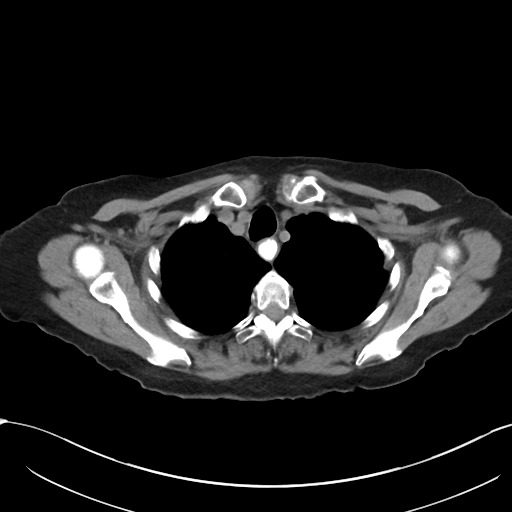

[Series 5: coronal · coronal · 0.78mm/px · 3 of 103 slices shown]
[im 35/103  soft-tissue]
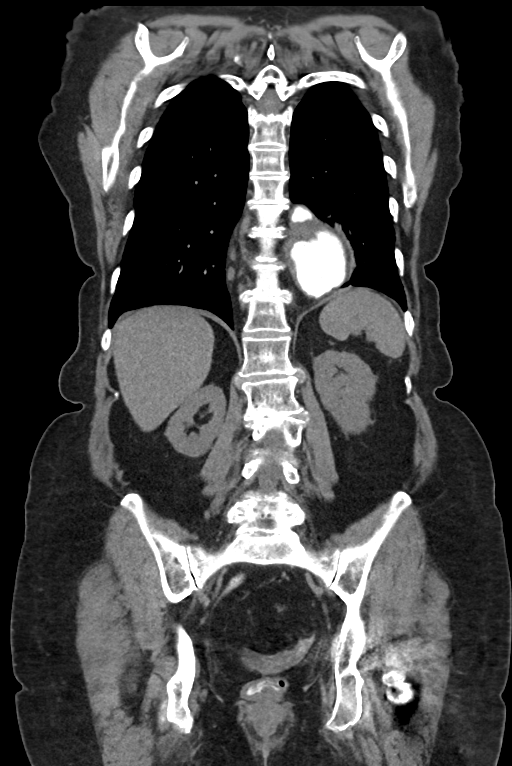
[im 46/103  soft-tissue]
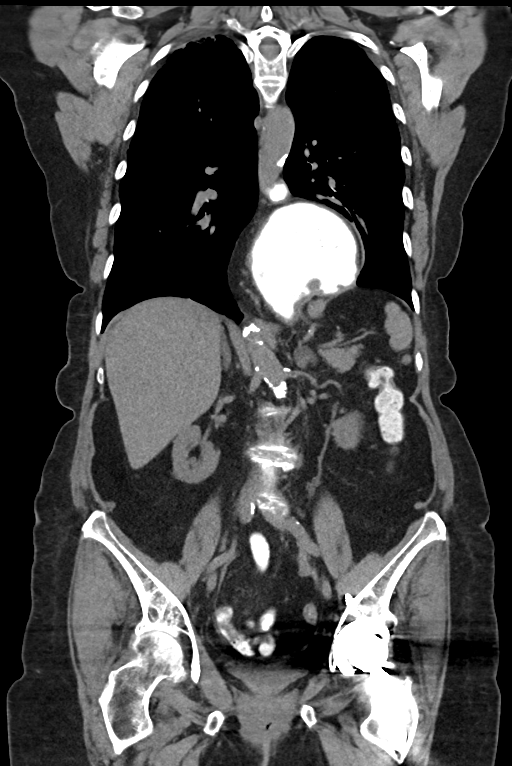
[im 57/103  soft-tissue]
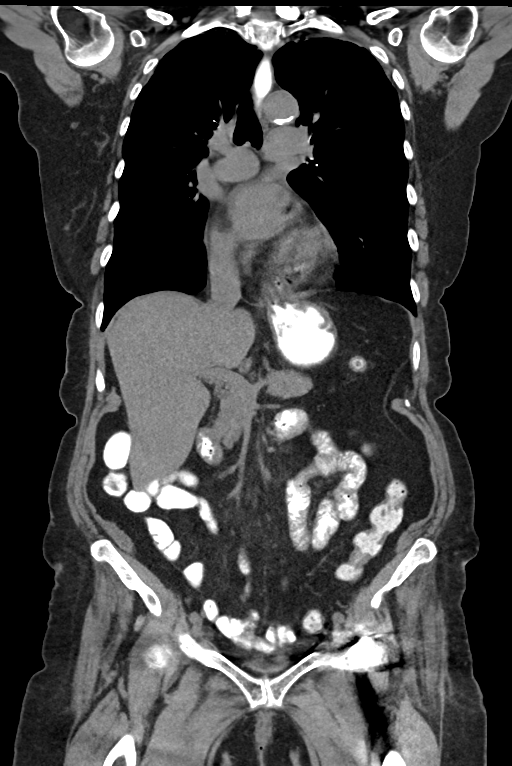

[14 of 46 positions shown; findings below may reference images not displayed]

FINDINGS: CT CHEST FINDINGS

Cardiovascular: Normal heart size. No significant pericardial
effusion/thickening. Three-vessel coronary atherosclerosis.
Atherosclerotic nonaneurysmal thoracic aorta. Normal caliber
pulmonary arteries.

Mediastinum/Nodes: No discrete thyroid nodules. Mildly patulous
thoracic esophagus filled with oral contrast. No pathologically
enlarged axillary, mediastinal or hilar lymph nodes, noting limited
sensitivity for the detection of hilar adenopathy on this
noncontrast study.

Lungs/Pleura: No pneumothorax. No pleural effusion. Compressive
atelectasis in the medial left lower lobe from the large hiatal
hernia. No acute consolidative airspace disease or lung masses.
Stable mild biapical reticular scarring. Scattered tiny solid
pulmonary nodules in the bilateral upper lobes, largest 2 mm in the
apical left upper lobe (series 4/image 31), all stable and
considered benign. No new significant pulmonary nodules.

Musculoskeletal: No aggressive appearing focal osseous lesions.
Moderate thoracic spondylosis.

CT ABDOMEN PELVIS FINDINGS

Hepatobiliary: Normal liver with no liver mass. Normal gallbladder
with no radiopaque cholelithiasis. No biliary ductal dilatation.

Pancreas: Normal, with no mass or duct dilation.

Spleen: Normal size. No mass.

Adrenals/Urinary Tract: Normal adrenals. No renal stones. No
hydronephrosis. No contour deforming renal masses. Normal
nondistended bladder.

Stomach/Bowel: Large hiatal hernia. Stomach is not significantly
distended and otherwise grossly normal. Interval right hemicolectomy
with ileocolic anastomosis in the right abdomen. No dilated or
thick-walled small bowel loops. Oral contrast transits to the left
colon. Normal large bowel with no diverticulosis, large bowel wall
thickening or pericolonic fat stranding.

Vascular/Lymphatic: Atherosclerotic nonaneurysmal abdominal aorta.
No pathologically enlarged lymph nodes in the abdomen or pelvis.

Reproductive: Status post hysterectomy, with no abnormal findings at
the vaginal cuff. No adnexal mass.

Other: No pneumoperitoneum, ascites or focal fluid collection.

Musculoskeletal: No aggressive appearing focal osseous lesions. Left
total hip arthroplasty. Marked lumbar spondylosis.
IMPRESSION: 1. No evidence of metastatic disease in the chest, abdomen or
pelvis. Tiny upper lobe pulmonary nodules are all stable and
considered benign.
2. Interval right hemicolectomy. No evidence of local tumor
recurrence.
3. Large hiatal hernia. Mildly patulous thoracic esophagus filled
with oral contrast, suggesting chronic esophageal dysmotility and/or
gastroesophageal reflux.
4. Three-vessel coronary atherosclerosis.
5. Aortic Atherosclerosis (M62Q8-YLQ.Q).

## 2023-01-14 NOTE — Progress Notes (Signed)
  [facility] OFFICE PROGRESS NOTE   Diagnosis: Colon cancer  INTERVAL HISTORY:   Amber Park returns as scheduled.  She reports frequent bowel movements each morning.  This improves after 11 AM.  No bleeding.  Good appetite.  She reports malaise.  Objective:  Vital signs in last 24 hours:  Blood pressure (!) 151/62, pulse 70, temperature 97.9 F (36.6 C), temperature source Oral, resp. rate 18, height 5\' 2"  (1.575 m), weight 147 lb 3.2 oz (66.8 kg), SpO2 100 %.     Lymphatics: No cervical, supraclavicular, axillary, or inguinal nodes Resp: Lungs clear bilaterally Cardio: Regular rate and rhythm GI: Nontender, no mass, no hepatosplenomegaly Vascular: No leg edema   Lab Results:  Lab Results  Component Value Date   WBC 7.8 09/02/2022   HGB 15.0 09/02/2022   HCT 44.2 09/02/2022   MCV 82.9 09/02/2022   PLT 276 09/02/2022   NEUTROABS 5.2 09/02/2022    CMP  Lab Results  Component Value Date   NA 136 02/17/2022   K 4.2 02/17/2022   CL 105 02/17/2022   CO2 23 02/17/2022   GLUCOSE 124 (H) 02/17/2022   BUN 22 02/17/2022   CREATININE 1.28 (H) 02/17/2022   CALCIUM 9.0 02/17/2022   PROT 7.2 07/23/2022   ALBUMIN 4.2 07/23/2022   AST 24 07/23/2022   ALT 12 07/23/2022   ALKPHOS 84 07/23/2022   BILITOT 0.7 07/23/2022   GFRNONAA 39 (L) 02/17/2022   GFRAA 60 (L) 02/13/2020    Lab Results  Component Value Date   CEA1 1.78 12/04/2020   CEA 1.13 01/13/2023     Medications: I have reviewed the patient's current medications.   Assessment/Plan: Colon cancer-stage IIIb Cecal mass on colonoscopy 01/03/2020, biopsy confirmed adenocarcinoma CTs 01/11/2020-cecal mass, prominent subcentimeter mesenteric nodes adjacent to the cecum, multiple tiny pulmonary nodules-metastases not favored Laparoscopic right colectomy 02/10/2020,pT3pN1a, grade 2-grade 3, 1/27 lymph nodes, 1 tumor deposit, MSI-high, loss of MLH1 and PMS 2 expression; MLH1 methylation detected CTs  02/05/2021-no evidence of recurrent disease, stable tiny upper lobe pulmonary nodules considered benign CTs 02/12/2022-nonspecific lymphadenopathy in the mesentery with mild mesenteric stranding, largest nodes measure up to 8 mm with no evidence of metastatic disease in the chest 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 and history of AVMs/Cameron's erosions Ferrous gluconate 07/24/2020-allergic/infusion reaction with chest and back pain, diarrhea, nausea/vomiting, chills, presyncope, hypotension Feraheme 08/14/2020 and 08/21/2020-tolerated well Hiatal hernia Family history of colon and bladder cancer Transverse colon tubular adenoma on the colonoscopy 01/03/2020 Hypertension Glaucoma Admission 02/12/2022 with choledocholithiasis, status post ERCP and stone removal     Disposition: Amber Park is in clinical remission from colon cancer.  She is now 3 years out from diagnosis.  She underwent surveillance imaging yesterday.  The CT has not been read.  We will contact her when the report is available.  She has frequent bowel movements in the mornings.  Iron caused constipation in the past.  She had GI upset when taking iron.  She will try ferrous fumarate every other day.  Amber Park will return for an office and lab visit in 6 months.  Thornton Papas, MD  01/14/2023  2:56 PM

## 2023-01-15 DIAGNOSIS — I129 Hypertensive chronic kidney disease with stage 1 through stage 4 chronic kidney disease, or unspecified chronic kidney disease: Secondary | ICD-10-CM | POA: Diagnosis not present

## 2023-01-15 DIAGNOSIS — N1832 Chronic kidney disease, stage 3b: Secondary | ICD-10-CM | POA: Diagnosis not present

## 2023-01-20 ENCOUNTER — Telehealth: Payer: Self-pay | Admitting: *Deleted

## 2023-01-20 NOTE — Telephone Encounter (Signed)
-----   Message from Ladene Artist, MD sent at 01/20/2023  9:54 AM EDT ----- Please call patient, I also CT findings with her, schedule an office visit for 230 on 01/22/2023 to review CTs, 30 minutes

## 2023-01-20 NOTE — Telephone Encounter (Signed)
Per Dr.Sherrill, called pt with message below. Pt verbalized understanding and confirmed appt for 7/3 at 2:30 pm.

## 2023-01-22 ENCOUNTER — Inpatient Hospital Stay: Payer: Medicare Other | Attending: Oncology | Admitting: Oncology

## 2023-01-22 VITALS — BP 161/62 | HR 74 | Temp 97.7°F | Resp 18 | Ht 62.0 in | Wt 147.6 lb

## 2023-01-22 DIAGNOSIS — Z85038 Personal history of other malignant neoplasm of large intestine: Secondary | ICD-10-CM | POA: Diagnosis not present

## 2023-01-22 DIAGNOSIS — H409 Unspecified glaucoma: Secondary | ICD-10-CM | POA: Diagnosis not present

## 2023-01-22 DIAGNOSIS — Z8052 Family history of malignant neoplasm of bladder: Secondary | ICD-10-CM | POA: Diagnosis not present

## 2023-01-22 DIAGNOSIS — I10 Essential (primary) hypertension: Secondary | ICD-10-CM | POA: Diagnosis not present

## 2023-01-22 DIAGNOSIS — Z800 Family history of malignant neoplasm of digestive organs: Secondary | ICD-10-CM | POA: Insufficient documentation

## 2023-01-22 DIAGNOSIS — D509 Iron deficiency anemia, unspecified: Secondary | ICD-10-CM | POA: Insufficient documentation

## 2023-01-22 DIAGNOSIS — C182 Malignant neoplasm of ascending colon: Secondary | ICD-10-CM

## 2023-01-22 DIAGNOSIS — R918 Other nonspecific abnormal finding of lung field: Secondary | ICD-10-CM | POA: Insufficient documentation

## 2023-01-22 NOTE — Progress Notes (Signed)
[facility] OFFICE PROGRESS NOTE   Diagnosis: Colon cancer  INTERVAL HISTORY:   Amber Park returns prior to scheduled visit.  I discussed the 01/13/2023 CT findings with her by telephone last week. She noted recent discomfort in the left arm.  No other complaint.  Objective:  Vital signs in last 24 hours:  Blood pressure (!) 161/62, pulse 74, temperature 97.7 F (36.5 C), temperature source Temporal, resp. rate 18, height 5\' 2"  (1.575 m), weight 147 lb 9.6 oz (67 kg), SpO2 96 %.   Lymphatics: No cervical, supraclavicular, or axillary nodes Resp: Lungs clear bilaterally Cardio: Regular rhythm with premature beats Vascular: No leg edema Musculoskeletal: No left chest wall mass.  Mild tenderness in the bilateral low axilla  Lab Results:  Lab Results  Component Value Date   WBC 7.8 09/02/2022   HGB 15.0 09/02/2022   HCT 44.2 09/02/2022   MCV 82.9 09/02/2022   PLT 276 09/02/2022   NEUTROABS 5.2 09/02/2022    CMP  Lab Results  Component Value Date   NA 136 02/17/2022   K 4.2 02/17/2022   CL 105 02/17/2022   CO2 23 02/17/2022   GLUCOSE 124 (H) 02/17/2022   BUN 22 02/17/2022   CREATININE 1.28 (H) 02/17/2022   CALCIUM 9.0 02/17/2022   PROT 7.2 07/23/2022   ALBUMIN 4.2 07/23/2022   AST 24 07/23/2022   ALT 12 07/23/2022   ALKPHOS 84 07/23/2022   BILITOT 0.7 07/23/2022   GFRNONAA 39 (L) 02/17/2022   GFRAA 60 (L) 02/13/2020    Lab Results  Component Value Date   CEA1 1.78 12/04/2020   CEA 1.13 01/13/2023   Medications: I have reviewed the patient's current medications.   Assessment/Plan: Colon cancer-stage IIIb Cecal mass on colonoscopy 01/03/2020, biopsy confirmed adenocarcinoma CTs 01/11/2020-cecal mass, prominent subcentimeter mesenteric nodes adjacent to the cecum, multiple tiny pulmonary nodules-metastases not favored Laparoscopic right colectomy 02/10/2020,pT3pN1a, grade 2-grade 3, 1/27 lymph nodes, 1 tumor deposit, MSI-high, loss of MLH1 and  PMS 2 expression; MLH1 methylation detected CTs 02/05/2021-no evidence of recurrent disease, stable tiny upper lobe pulmonary nodules considered benign CTs 02/12/2022-nonspecific lymphadenopathy in the mesentery with mild mesenteric stranding, largest nodes measure up to 8 mm with no evidence of metastatic disease in the chest CT 01/13/2023-new pleural-based masses in the lateral left upper chest with destruction of the left anterior second rib, no other evidence of metastatic disease 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 and history of AVMs/Cameron's erosions Ferrous gluconate 07/24/2020-allergic/infusion reaction with chest and back pain, diarrhea, nausea/vomiting, chills, presyncope, hypotension Feraheme 08/14/2020 and 08/21/2020-tolerated well Hiatal hernia Family history of colon and bladder cancer Transverse colon tubular adenoma on the colonoscopy 01/03/2020 Hypertension Glaucoma Admission 02/12/2022 with choledocholithiasis, status post ERCP and stone removal       Disposition: Amber Park has a history of stage III colon cancer diagnosed in June 2021.  She underwent surveillance imaging 01/13/2023.  There are new pleural-based masses at the left chest.  I discussed the CT findings and viewed the images with Amber Park and her daughter.  We discussed the differential diagnosis including metastatic colon cancer, lung cancer, lymphoma, and other malignancies.  It is possible, but unlikely the CT findings represent a benign process.  We discussed observation given her age and lack of significant symptoms.  We discussed obtaining a biopsy in order to arrive at a diagnosis.  She favors obtaining a diagnosis and agrees to a biopsy of the chest wall mass.  She will be referred for  a CT-guided biopsy of the left chest wall mass.  She will return for an office visit after the biopsy.  Thornton Papas, MD  01/22/2023  3:34 PM

## 2023-01-24 NOTE — Progress Notes (Signed)
[name], MD  Leodis Rains D; P Ir Procedure Requests Approved for CT guided biopsy of left chest wall mass.  H/O colon cancer.  HKM

## 2023-01-27 ENCOUNTER — Telehealth: Payer: Self-pay | Admitting: Oncology

## 2023-01-27 NOTE — Telephone Encounter (Signed)
Spoke with patient confirming upcoming appointment change  

## 2023-02-05 ENCOUNTER — Ambulatory Visit: Payer: Medicare Other | Admitting: Oncology

## 2023-02-06 ENCOUNTER — Other Ambulatory Visit (HOSPITAL_COMMUNITY): Payer: Self-pay | Admitting: Student

## 2023-02-06 DIAGNOSIS — C182 Malignant neoplasm of ascending colon: Secondary | ICD-10-CM

## 2023-02-06 NOTE — Progress Notes (Signed)
   Chief Complaint: Left chest wall mass. Request is for biopsy. Patient on 81 mg of ASA. Last dose on 7.19.24. Case to rescheduled per IR Attending Dr. Odis Luster.   Referring Physician(s): Ladene Artist  Supervising Physician: Simonne Come  Patient Status: [facility] - Out-pt   Amber Park is a 89 y.o. female  outpatient. History of  HTN, anemia, colon cancer (ascending). Found to have a left chest wall mass during surveillance screening.  CT CAP from 6.27.24 reads New pleural-based masses in lateral left upper lung field, with chest wall involvement and destruction of the left anterior 2nd rib. Differential diagnosis includes metastatic disease, primary bronchogenic carcinoma, and less likely malignant mesothelioma. Team is requesting a left chest wall mass biopsy for further evaluation. Case approved by IR Attending Dr. Vella Redhead.

## 2023-02-07 ENCOUNTER — Ambulatory Visit (HOSPITAL_COMMUNITY)
Admission: RE | Admit: 2023-02-07 | Discharge: 2023-02-07 | Disposition: A | Discharge status: Home / Self Care | Payer: Medicare Other | Source: Ambulatory Visit | Attending: Oncology | Admitting: Oncology

## 2023-02-07 ENCOUNTER — Encounter (HOSPITAL_COMMUNITY): Payer: Self-pay

## 2023-02-07 ENCOUNTER — Other Ambulatory Visit: Payer: Self-pay

## 2023-02-07 DIAGNOSIS — R222 Localized swelling, mass and lump, trunk: Secondary | ICD-10-CM | POA: Diagnosis not present

## 2023-02-07 DIAGNOSIS — C182 Malignant neoplasm of ascending colon: Secondary | ICD-10-CM | POA: Insufficient documentation

## 2023-02-07 DIAGNOSIS — I10 Essential (primary) hypertension: Secondary | ICD-10-CM | POA: Insufficient documentation

## 2023-02-07 LAB — CBC
HCT: 44.9 % (ref 36.0–46.0)
Hemoglobin: 15.4 g/dL — ABNORMAL HIGH (ref 12.0–15.0)
MCH: 28.1 pg (ref 26.0–34.0)
MCHC: 34.3 g/dL (ref 30.0–36.0)
MCV: 81.8 fL (ref 80.0–100.0)
Platelets: 288 10*3/uL (ref 150–400)
RBC: 5.49 MIL/uL — ABNORMAL HIGH (ref 3.87–5.11)
RDW: 13.1 % (ref 11.5–15.5)
WBC: 10.2 10*3/uL (ref 4.0–10.5)
nRBC: 0 % (ref 0.0–0.2)

## 2023-02-07 LAB — PROTIME-INR
INR: 0.9 (ref 0.8–1.2)
Prothrombin Time: 12.4 seconds (ref 11.4–15.2)

## 2023-02-07 MED ORDER — SODIUM CHLORIDE 0.9 % IV SOLN: INTRAVENOUS | Status: DC

## 2023-02-07 NOTE — Progress Notes (Signed)
Pt took asa yesterday, procedure cancelled

## 2023-02-11 ENCOUNTER — Telehealth: Payer: Self-pay | Admitting: *Deleted

## 2023-02-11 NOTE — Telephone Encounter (Signed)
Amber Park reports her biopsy of 7/19 was canceled due to national computer issue and she did not hold her ASA long enough prior. Has been rescheduled for 8/02 and will hold ASA x 5 days-needs to reschedule MD visit. Message to Dr. Truett Perna to give date/time for f/u.

## 2023-02-12 ENCOUNTER — Inpatient Hospital Stay: Payer: Medicare Other | Admitting: Oncology

## 2023-02-14 ENCOUNTER — Other Ambulatory Visit: Payer: Self-pay | Admitting: Internal Medicine

## 2023-02-14 DIAGNOSIS — C8590 Non-Hodgkin lymphoma, unspecified, unspecified site: Secondary | ICD-10-CM

## 2023-02-17 ENCOUNTER — Inpatient Hospital Stay: Payer: Medicare Other | Admitting: Oncology

## 2023-02-20 ENCOUNTER — Other Ambulatory Visit (HOSPITAL_COMMUNITY): Payer: Self-pay | Admitting: Student

## 2023-02-20 DIAGNOSIS — C182 Malignant neoplasm of ascending colon: Secondary | ICD-10-CM

## 2023-02-21 ENCOUNTER — Ambulatory Visit (HOSPITAL_COMMUNITY)
Admission: RE | Admit: 2023-02-21 | Discharge: 2023-02-21 | Disposition: A | Discharge status: Home / Self Care | Payer: Medicare Other | Source: Ambulatory Visit | Attending: Oncology | Admitting: Oncology

## 2023-02-21 ENCOUNTER — Other Ambulatory Visit: Payer: Self-pay

## 2023-02-21 DIAGNOSIS — C8212 Follicular lymphoma grade II, intrathoracic lymph nodes: Secondary | ICD-10-CM | POA: Diagnosis not present

## 2023-02-21 DIAGNOSIS — Z87891 Personal history of nicotine dependence: Secondary | ICD-10-CM | POA: Insufficient documentation

## 2023-02-21 DIAGNOSIS — C8330 Diffuse large B-cell lymphoma, unspecified site: Secondary | ICD-10-CM | POA: Diagnosis not present

## 2023-02-21 DIAGNOSIS — C8512 Unspecified B-cell lymphoma, intrathoracic lymph nodes: Secondary | ICD-10-CM | POA: Diagnosis not present

## 2023-02-21 DIAGNOSIS — C8519 Unspecified B-cell lymphoma, extranodal and solid organ sites: Secondary | ICD-10-CM | POA: Insufficient documentation

## 2023-02-21 DIAGNOSIS — R222 Localized swelling, mass and lump, trunk: Secondary | ICD-10-CM | POA: Diagnosis not present

## 2023-02-21 DIAGNOSIS — C182 Malignant neoplasm of ascending colon: Secondary | ICD-10-CM | POA: Insufficient documentation

## 2023-02-21 DIAGNOSIS — R918 Other nonspecific abnormal finding of lung field: Secondary | ICD-10-CM | POA: Diagnosis not present

## 2023-02-21 DIAGNOSIS — R0602 Shortness of breath: Secondary | ICD-10-CM | POA: Diagnosis not present

## 2023-02-21 LAB — CBC
HCT: 44.1 % (ref 36.0–46.0)
Hemoglobin: 14.8 g/dL (ref 12.0–15.0)
MCH: 27.4 pg (ref 26.0–34.0)
MCHC: 33.6 g/dL (ref 30.0–36.0)
MCV: 81.7 fL (ref 80.0–100.0)
Platelets: 292 10*3/uL (ref 150–400)
RBC: 5.4 MIL/uL — ABNORMAL HIGH (ref 3.87–5.11)
RDW: 12.9 % (ref 11.5–15.5)
WBC: 8 10*3/uL (ref 4.0–10.5)
nRBC: 0 % (ref 0.0–0.2)

## 2023-02-21 LAB — PROTIME-INR
INR: 1 (ref 0.8–1.2)
Prothrombin Time: 13 seconds (ref 11.4–15.2)

## 2023-02-21 MED ORDER — MIDAZOLAM HCL 2 MG/2ML IJ SOLN
INTRAMUSCULAR | Status: AC
  Filled 2023-02-21: qty 2

## 2023-02-21 MED ORDER — SODIUM CHLORIDE 0.9 % IV SOLN: INTRAVENOUS | Status: DC

## 2023-02-21 MED ORDER — FENTANYL CITRATE (PF) 100 MCG/2ML IJ SOLN
INTRAMUSCULAR | Status: AC | PRN
  Administered 2023-02-21 (×2): 25 ug via INTRAVENOUS

## 2023-02-21 MED ORDER — FENTANYL CITRATE (PF) 100 MCG/2ML IJ SOLN
INTRAMUSCULAR | Status: AC
  Filled 2023-02-21: qty 2

## 2023-02-21 MED ORDER — MIDAZOLAM HCL 2 MG/2ML IJ SOLN
INTRAMUSCULAR | Status: AC | PRN
  Administered 2023-02-21 (×2): .5 mg via INTRAVENOUS

## 2023-02-21 MED ORDER — LIDOCAINE HCL (PF) 1 % IJ SOLN
10.0000 mL | Freq: Once | INTRAMUSCULAR | Status: AC
  Administered 2023-02-21: 10 mL
  Filled 2023-02-21: qty 10

## 2023-02-21 NOTE — H&P (Signed)
Chief Complaint: Patient was seen in consultation today for chest wall mass biopsy  Referring Physician(s): Ladene Artist  Supervising Physician: Dr. Deanne Coffer   Patient Status: [facility] - Out-pt  History of Present Illness: Amber Park is a 89 y.o. female with a medical history significant for HTN, IDA, glaucoma and colon cancer diagnosed in 2021. She is s/p right colectomy. A recent surveillance CT showed new pleural-based masses in the left lung with chest wall involvement.   CT chest/abdomen/pelvis 01/13/23 Lungs/Pleura: New pleural-based masses are seen in the lateral and anterior left upper lung field. The largest measures 8.0 by 3.3 cm on image 12/2, and shows chest wall involvement with destruction of the left anterior 2nd rib. Smaller pleural-based mass is seen along the left anterior chest wall measuring 2.3 x 1.5 cm on image 25/2. These findings are new since prior study. No evidence of pleural effusion. No other suspicious pulmonary nodules or masses identified. IMPRESSION: 1. New pleural-based masses in lateral left upper lung field, with chest wall involvement and destruction of the left anterior 2nd rib. 2. Differential diagnosis includes metastatic disease, primary bronchogenic carcinoma, and less likely malignant mesothelioma. No other sites of malignancy or metastatic disease identified. 3. Stable large hiatal hernia.  Interventional Radiology has been asked by the patient's oncology team to evaluate this patient for an image-guided chest wall mass biopsy. Imaging reviewed and procedure approved by Dr. Archer Asa  Past Medical History:  Diagnosis Date   Anemia    iron deficiency   Anxiety    Arthritis    AVM (arteriovenous malformation)    colon, stomach and bowel ( per progress note)   Cancer (HCC)    hx of skin cancer removed from nose    Family history of adverse reaction to anesthesia    Pt son has PONV   GERD (gastroesophageal reflux disease)     Glaucoma    Hearing aid worn    B/L   History of blood transfusion 1963   History of hiatal hernia    History of left bundle branch block    old ekg 06-23-11 dr Pete Glatter on chart   HOH (hard of hearing)    Hypertension    Pneumonia    hx of pneumonia x 4    PONV (postoperative nausea and vomiting)    Shortness of breath dyspnea    with exertion    Wears glasses     Past Surgical History:  Procedure Laterality Date   ABDOMINAL HYSTERECTOMY     BACK SURGERY     x 2   BIOPSY  01/03/2020   Procedure: BIOPSY;  Surgeon: Lemar Lofty., MD;  Location: [facility] ENDOSCOPY;  Service: Gastroenterology;;   BIOPSY  02/16/2022   Procedure: BIOPSY;  Surgeon: Lemar Lofty., MD;  Location: [facility] ENDOSCOPY;  Service: Gastroenterology;;   BREAST SURGERY     bilateral cyst removal    COLONOSCOPY WITH PROPOFOL N/A 01/03/2020   Procedure: COLONOSCOPY WITH PROPOFOL;  Surgeon: Lemar Lofty., MD;  Location: [facility] ENDOSCOPY;  Service: Gastroenterology;  Laterality: N/A;   ENTEROSCOPY N/A 09/16/2018   Procedure: ENTEROSCOPY;  Surgeon: Meridee Score Netty Starring., MD;  Location: [facility] ENDOSCOPY;  Service: Gastroenterology;  Laterality: N/A;   ENTEROSCOPY N/A 01/03/2020   Procedure: ENTEROSCOPY;  Surgeon: Meridee Score Netty Starring., MD;  Location: [facility] ENDOSCOPY;  Service: Gastroenterology;  Laterality: N/A;   ERCP N/A 02/16/2022   Procedure: ENDOSCOPIC RETROGRADE CHOLANGIOPANCREATOGRAPHY (ERCP);  Surgeon: Lemar Lofty., MD;  Location: [facility] ENDOSCOPY;  Service: Gastroenterology;  Laterality: N/A;   HOT HEMOSTASIS N/A 09/16/2018   Procedure: HOT HEMOSTASIS (ARGON PLASMA COAGULATION/BICAP);  Surgeon: Lemar Lofty., MD;  Location: [facility] ENDOSCOPY;  Service: Gastroenterology;  Laterality: N/A;   LAPAROSCOPIC PARTIAL COLECTOMY N/A 02/10/2020   Procedure: LAPAROSCOPIC PARTIAL COLECTOMY;  Surgeon: Romie Levee, MD;  Location: [facility] ORS;  Service: General;  Laterality: N/A;   LEFT HEART CATH AND  CORONARY ANGIOGRAPHY N/A 02/13/2022   Procedure: LEFT HEART CATH AND CORONARY ANGIOGRAPHY;  Surgeon: Marykay Lex, MD;  Location: [facility] INVASIVE CV LAB;  Service: Cardiovascular;  Laterality: N/A;   POLYPECTOMY  01/03/2020   Procedure: POLYPECTOMY;  Surgeon: Meridee Score Netty Starring., MD;  Location: [facility] ENDOSCOPY;  Service: Gastroenterology;;   REMOVAL OF STONES  02/16/2022   Procedure: REMOVAL OF STONES;  Surgeon: Lemar Lofty., MD;  Location: [facility] ENDOSCOPY;  Service: Gastroenterology;;   Dennison Mascot  02/16/2022   Procedure: Dennison Mascot;  Surgeon: Lemar Lofty., MD;  Location: [facility] ENDOSCOPY;  Service: Gastroenterology;;   SUBMUCOSAL TATTOO INJECTION  01/03/2020   Procedure: SUBMUCOSAL TATTOO INJECTION;  Surgeon: Lemar Lofty., MD;  Location: [facility] ENDOSCOPY;  Service: Gastroenterology;;   TONSILLECTOMY     TOTAL HIP ARTHROPLASTY Left 05/31/2014   Procedure: LEFT TOTAL HIP ARTHROPLASTY ANTERIOR APPROACH;  Surgeon: Shelda Pal, MD;  Location: [facility] ORS;  Service: Orthopedics;  Laterality: Left;    Allergies: Ferrous gluconate, Anesthesia s-i-40 [propofol], Codeine, Gemtesa [vibegron], Meperidine hcl, Pneumococcal vac polyvalent, and Simvastatin  Medications: Prior to Admission medications   Medication Sig Start Date End Date Taking? Authorizing Provider  acetaminophen (TYLENOL) 500 MG tablet Take 500 mg by mouth daily as needed for mild pain.    [provider]  amLODipine (NORVASC) 10 MG tablet Take 10 mg by mouth daily. 08/28/18   [provider]  aspirin EC 81 MG tablet Take 1 tablet (81 mg total) by mouth daily. Swallow whole. 02/18/22   Barnetta Chapel, MD  Cholecalciferol (VITAMIN D3) 50 MCG (2000 UT) capsule Take 2,000 Units by mouth daily.    [provider]  hydrochlorothiazide (HYDRODIURIL) 25 MG tablet Take 25 mg by mouth daily. 06/15/19   [provider]  latanoprost (XALATAN) 0.005 % ophthalmic solution Place 1 drop  into both eyes at bedtime.  09/11/18   [provider]  metoprolol tartrate (LOPRESSOR) 25 MG tablet Take 1 tablet (25 mg total) by mouth 2 (two) times daily. 02/17/22 02/07/23  Berton Mount I, MD  pantoprazole (PROTONIX) 40 MG tablet TAKE 1 TABLET(40 MG) BY MOUTH DAILY 11/29/22   Mansouraty, Netty Starring., MD  potassium chloride (KLOR-CON) 10 MEQ tablet Take 10 mEq by mouth daily.    [provider]  tiZANidine (ZANAFLEX) 4 MG tablet Take 1 tablet (4 mg total) by mouth every 8 (eight) hours as needed for muscle spasms. Patient taking differently: Take 4 mg by mouth at bedtime. 06/02/14   Lanney Gins, PA-C     Family History  Problem Relation Age of Onset   Colon cancer Mother 42       Died age 68   Heart attack Father 74       Died in his sleep of an MI   Lung cancer Sister        LLL resection, otherwise no treatment; now recurred   Bladder Cancer Sister        Likely primary   Renal cancer Sister    Breast cancer Sister    Heart disease Brother    Heart disease Brother  Esophageal cancer Neg Hx    Inflammatory bowel disease Neg Hx    Liver disease Neg Hx    Pancreatic cancer Neg Hx    Stomach cancer Neg Hx     Social History   Socioeconomic History   Marital status: Widowed    Spouse name: Not on file   Number of children: 3   Years of education: Not on file   Highest education level: Not on file  Occupational History   Occupation: Retired Office manager at [employer]  Tobacco Use   Smoking status: Former    Current packs/day: 0.00    Average packs/day: 0.3 packs/day for 50.0 years (15.0 ttl pk-yrs)    Types: Cigarettes    Start date: 83    Quit date: 2005    Years since quitting: 19.5   Smokeless tobacco: Never  Vaping Use   Vaping status: Never Used  Substance and Sexual Activity   Alcohol use: Not Currently    Comment: rare    Drug use: No   Sexual activity: Not on file  Other Topics Concern   Not on file  Social History  Narrative   Not on file   Social Determinants of Health   Financial Resource Strain: Not on file  Food Insecurity: Not on file  Transportation Needs: Not on file  Physical Activity: Not on file  Stress: Not on file  Social Connections: Not on file    Review of Systems: A 12 point ROS discussed and pertinent positives are indicated in the HPI above.  All other systems are negative.  Review of Systems  Constitutional:  Negative for appetite change and fatigue.  Respiratory:  Positive for shortness of breath. Negative for cough.   Cardiovascular:  Negative for chest pain and leg swelling.  Gastrointestinal:  Positive for diarrhea. Negative for abdominal pain, nausea and vomiting.  Musculoskeletal:  Negative for back pain.  Neurological:  Negative for dizziness and headaches.    Vital Signs: BP (!) 159/56   Pulse 74   Temp 98.2 F (36.8 C) (Oral)   Resp 16   Ht 5\' 2"  (1.575 m)   Wt 150 lb (68 kg)   SpO2 95%   BMI 27.44 kg/m   Physical Exam Constitutional:      General: She is not in acute distress.    Appearance: She is not ill-appearing.  HENT:     Mouth/Throat:     Mouth: Mucous membranes are moist.     Pharynx: Oropharynx is clear.  Cardiovascular:     Rate and Rhythm: Normal rate and regular rhythm.     Pulses: Normal pulses.     Heart sounds: Normal heart sounds.  Pulmonary:     Effort: Pulmonary effort is normal.     Breath sounds: Normal breath sounds.  Abdominal:     General: Bowel sounds are normal.     Palpations: Abdomen is soft.     Tenderness: There is no abdominal tenderness.  Musculoskeletal:     Right lower leg: No edema.     Left lower leg: No edema.  Skin:    General: Skin is warm and dry.  Neurological:     Mental Status: She is alert and oriented to person, place, and time.  Psychiatric:        Mood and Affect: Mood normal.        Behavior: Behavior normal.        Thought Content: Thought content normal.  Judgment: Judgment  normal.     Imaging: No results found.  Labs:  CBC: Recent Labs    07/23/22 1150 09/02/22 1118 02/07/23 1011 02/21/23 0849  WBC 7.1 7.8 10.2 8.0  HGB 15.0 15.0 15.4* 14.8  HCT 44.3 44.2 44.9 44.1  PLT 279.0 276 288 292    COAGS: Recent Labs    02/07/23 1011 02/21/23 0849  INR 0.9 1.0    BMP: No results for input(s): "NA", "K", "CL", "CO2", "GLUCOSE", "BUN", "CALCIUM", "CREATININE", "GFRNONAA", "GFRAA" in the last 8760 hours.  Invalid input(s): "CMP"  LIVER FUNCTION TESTS: Recent Labs    02/25/22 1112 07/23/22 1150  BILITOT 0.7 0.7  AST 24 24  ALT 23 12  ALKPHOS 102 84  PROT 7.2 7.2  ALBUMIN 4.3 4.2    TUMOR MARKERS: Recent Labs    09/02/22 1118 01/13/23 1256  CEA 1.45 1.13    Assessment and Plan:  History of colon cancer; new pleural-based left lung masses with chest wall involvement; Amber Park, 89 year old female, presents today to the [facility] Interventional Radiology department for an image-guided left chest wall mass biopsy.   Risks and benefits of this procedure was discussed with the patient and/or patient's family including, but not limited to bleeding, infection, damage to adjacent structures or low yield requiring additional tests.  All of the questions were answered and there is agreement to proceed. She has been NPO. She is a full code. She does not take any blood-thinning medications.   Consent signed and in chart.  Thank you for this interesting consult.  I greatly enjoyed meeting Amber Park and look forward to participating in their care.  A copy of this report was sent to the requesting provider on this date.  Electronically Signed: Alwyn Ren, AGACNP-BC [phone] 02/21/2023, 9:23 AM   I spent a total of  30 Minutes   in face to face in clinical consultation, greater than 50% of which was counseling/coordinating care for left chest wall mass biopsy.

## 2023-02-21 NOTE — Procedures (Signed)
  Procedure:  L chest wall mass biopsy under CT   Preprocedure diagnosis: The encounter diagnosis was Colon cancer, ascending (HCC). Postprocedure diagnosis: same EBL:    minimal Complications:   none immediate  See full dictation in [employer].  Thora Lance MD Main # [phone] Pager  [phone] Mobile [phone]

## 2023-02-26 ENCOUNTER — Inpatient Hospital Stay: Payer: Medicare Other | Attending: Oncology | Admitting: Oncology

## 2023-02-26 VITALS — BP 154/51 | HR 63 | Temp 98.1°F | Resp 18 | Ht 62.0 in | Wt 146.0 lb

## 2023-02-26 DIAGNOSIS — C8518 Unspecified B-cell lymphoma, lymph nodes of multiple sites: Secondary | ICD-10-CM | POA: Diagnosis not present

## 2023-02-26 DIAGNOSIS — C8589 Other specified types of non-Hodgkin lymphoma, extranodal and solid organ sites: Secondary | ICD-10-CM | POA: Diagnosis not present

## 2023-02-26 DIAGNOSIS — R918 Other nonspecific abnormal finding of lung field: Secondary | ICD-10-CM | POA: Diagnosis not present

## 2023-02-26 DIAGNOSIS — Z85048 Personal history of other malignant neoplasm of rectum, rectosigmoid junction, and anus: Secondary | ICD-10-CM | POA: Diagnosis not present

## 2023-02-26 DIAGNOSIS — C182 Malignant neoplasm of ascending colon: Secondary | ICD-10-CM

## 2023-02-26 DIAGNOSIS — I10 Essential (primary) hypertension: Secondary | ICD-10-CM | POA: Diagnosis not present

## 2023-02-26 DIAGNOSIS — D509 Iron deficiency anemia, unspecified: Secondary | ICD-10-CM | POA: Diagnosis not present

## 2023-02-26 DIAGNOSIS — Z79899 Other long term (current) drug therapy: Secondary | ICD-10-CM | POA: Diagnosis not present

## 2023-02-26 DIAGNOSIS — H409 Unspecified glaucoma: Secondary | ICD-10-CM | POA: Diagnosis not present

## 2023-02-26 NOTE — Progress Notes (Signed)
[facility] OFFICE PROGRESS NOTE   Diagnosis: Colon cancer, left chest wall mass  INTERVAL HISTORY:   Ms. Raudenbush returns as scheduled.  She underwent a CT-guided biopsy of the left chest wall mass on 02/21/2023.  She reports tolerating procedure well.  She has noted increased discomfort at the left axilla/chest wall.  She has difficulty lying on the left side.  She is not taking pain medication.  She complains of malaise.  No fever or night sweats.  Objective:  Vital signs in last 24 hours:  Blood pressure (!) 154/51, pulse 63, temperature 98.1 F (36.7 C), resp. rate 18, height 5\' 2"  (1.575 m), weight 146 lb (66.2 kg), SpO2 96%.    Lymphatics: No cervical, supraclavicular, axillary, or inguinal nodes Resp: Lungs clear bilaterally Cardio: Regular rate and rhythm GI: No hepatosplenomegaly Vascular: No leg edema Musculoskeletal: Tenderness with firm masslike fullness at the low left axilla extending toward the superior lateral aspect of the left breast  Lab Results:  Lab Results  Component Value Date   WBC 8.0 02/21/2023   HGB 14.8 02/21/2023   HCT 44.1 02/21/2023   MCV 81.7 02/21/2023   PLT 292 02/21/2023   NEUTROABS 5.2 09/02/2022    CMP  Lab Results  Component Value Date   NA 136 02/17/2022   K 4.2 02/17/2022   CL 105 02/17/2022   CO2 23 02/17/2022   GLUCOSE 124 (H) 02/17/2022   BUN 22 02/17/2022   CREATININE 1.28 (H) 02/17/2022   CALCIUM 9.0 02/17/2022   PROT 7.2 07/23/2022   ALBUMIN 4.2 07/23/2022   AST 24 07/23/2022   ALT 12 07/23/2022   ALKPHOS 84 07/23/2022   BILITOT 0.7 07/23/2022   GFRNONAA 39 (L) 02/17/2022   GFRAA 60 (L) 02/13/2020    Lab Results  Component Value Date   CEA1 1.78 12/04/2020   CEA 1.13 01/13/2023    Lab Results  Component Value Date   INR 1.0 02/21/2023   LABPROT 13.0 02/21/2023    Imaging:  No results found.  Medications: I have reviewed the patient's current medications.   Assessment/Plan: Colon  cancer-stage IIIb Cecal mass on colonoscopy 01/03/2020, biopsy confirmed adenocarcinoma CTs 01/11/2020-cecal mass, prominent subcentimeter mesenteric nodes adjacent to the cecum, multiple tiny pulmonary nodules-metastases not favored Laparoscopic right colectomy 02/10/2020,pT3pN1a, grade 2-grade 3, 1/27 lymph nodes, 1 tumor deposit, MSI-high, loss of MLH1 and PMS 2 expression; MLH1 methylation detected CTs 02/05/2021-no evidence of recurrent disease, stable tiny upper lobe pulmonary nodules considered benign CTs 02/12/2022-nonspecific lymphadenopathy in the mesentery with mild mesenteric stranding, largest nodes measure up to 8 mm with no evidence of metastatic disease in the chest CT 01/13/2023-new pleural-based masses in the lateral left upper chest with destruction of the left anterior second rib, no other evidence of metastatic disease 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 Shubin has a history of colon cancer.  She underwent a CT-guided biopsy of a left chest wall mass on 02/21/2023.  I discussed the case with pathology.  The preliminary pathology is consistent with lymphoma.  Additional immunohistochemical stains are pending.  I discussed the preliminary diagnosis of Ms. Christy and her daughters.  They understand we need to wait for the final pathology report prior to making a treatment plan.  Treatment would likely consist of chemotherapy and  biologic therapy.  She appears to be a good candidate for systemic therapy.  We will contact her when we receive the final pathology report.  Ms. Tamminga return for an office visit to discuss a treatment plan on 03/03/2023.  We will check baseline labs on 03/03/2023. Thornton Papas, MD  02/26/2023  12:18 PM

## 2023-02-27 ENCOUNTER — Telehealth: Payer: Self-pay | Admitting: *Deleted

## 2023-02-27 NOTE — Telephone Encounter (Signed)
1st available PET 03/11/23 at 0830/0900 at [facility]. NPO except unflavored water for 6 hours prior.  Patient notified and she agrees. She decline to go to [facility] for scan on 8/15. Preferred to wait till 8/20.

## 2023-03-03 ENCOUNTER — Telehealth: Payer: Self-pay | Admitting: *Deleted

## 2023-03-03 ENCOUNTER — Other Ambulatory Visit: Payer: Medicare Other

## 2023-03-03 ENCOUNTER — Ambulatory Visit: Payer: Medicare Other | Admitting: Oncology

## 2023-03-03 NOTE — Telephone Encounter (Signed)
Called Mrs. 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 Offered to come on 8/16 to start talking about her treatment plan. She agrees and will see if family can bring her. She will call back to confirm 8/16 or to keep on 8/21. Informed her she still needs the PET scan, but we can start planning treatment prior.

## 2023-03-07 ENCOUNTER — Other Ambulatory Visit: Payer: Self-pay | Admitting: *Deleted

## 2023-03-07 ENCOUNTER — Inpatient Hospital Stay: Payer: Medicare Other

## 2023-03-07 ENCOUNTER — Other Ambulatory Visit (HOSPITAL_BASED_OUTPATIENT_CLINIC_OR_DEPARTMENT_OTHER): Payer: Medicare Other

## 2023-03-07 ENCOUNTER — Encounter: Payer: Self-pay | Admitting: Oncology

## 2023-03-07 ENCOUNTER — Other Ambulatory Visit: Payer: Self-pay | Admitting: Radiology

## 2023-03-07 ENCOUNTER — Telehealth: Payer: Self-pay

## 2023-03-07 ENCOUNTER — Inpatient Hospital Stay: Payer: Medicare Other | Admitting: Oncology

## 2023-03-07 VITALS — BP 119/77 | HR 71 | Temp 98.2°F | Resp 18 | Ht 62.0 in | Wt 146.0 lb

## 2023-03-07 DIAGNOSIS — I10 Essential (primary) hypertension: Secondary | ICD-10-CM | POA: Diagnosis not present

## 2023-03-07 DIAGNOSIS — C182 Malignant neoplasm of ascending colon: Secondary | ICD-10-CM | POA: Diagnosis not present

## 2023-03-07 DIAGNOSIS — C8590 Non-Hodgkin lymphoma, unspecified, unspecified site: Secondary | ICD-10-CM

## 2023-03-07 DIAGNOSIS — H409 Unspecified glaucoma: Secondary | ICD-10-CM | POA: Diagnosis not present

## 2023-03-07 DIAGNOSIS — Z85048 Personal history of other malignant neoplasm of rectum, rectosigmoid junction, and anus: Secondary | ICD-10-CM | POA: Diagnosis not present

## 2023-03-07 DIAGNOSIS — Z79899 Other long term (current) drug therapy: Secondary | ICD-10-CM | POA: Diagnosis not present

## 2023-03-07 DIAGNOSIS — C8518 Unspecified B-cell lymphoma, lymph nodes of multiple sites: Secondary | ICD-10-CM | POA: Diagnosis not present

## 2023-03-07 DIAGNOSIS — C8580 Other specified types of non-Hodgkin lymphoma, unspecified site: Secondary | ICD-10-CM

## 2023-03-07 DIAGNOSIS — C8589 Other specified types of non-Hodgkin lymphoma, extranodal and solid organ sites: Secondary | ICD-10-CM | POA: Diagnosis not present

## 2023-03-07 DIAGNOSIS — D509 Iron deficiency anemia, unspecified: Secondary | ICD-10-CM | POA: Diagnosis not present

## 2023-03-07 DIAGNOSIS — R918 Other nonspecific abnormal finding of lung field: Secondary | ICD-10-CM | POA: Diagnosis not present

## 2023-03-07 LAB — CMP (CANCER CENTER ONLY)
ALT: 11 U/L (ref 0–44)
AST: 34 U/L (ref 15–41)
Albumin: 4.2 g/dL (ref 3.5–5.0)
Alkaline Phosphatase: 73 U/L (ref 38–126)
Anion gap: 8 (ref 5–15)
BUN: 22 mg/dL (ref 8–23)
CO2: 31 mmol/L (ref 22–32)
Calcium: 13.6 mg/dL (ref 8.9–10.3)
Chloride: 95 mmol/L — ABNORMAL LOW (ref 98–111)
Creatinine: 1.41 mg/dL — ABNORMAL HIGH (ref 0.44–1.00)
GFR, Estimated: 34 mL/min — ABNORMAL LOW (ref >60)
Glucose, Bld: 95 mg/dL (ref 70–99)
Potassium: 4 mmol/L (ref 3.5–5.1)
Sodium: 134 mmol/L — ABNORMAL LOW (ref 135–145)
Total Bilirubin: 0.9 mg/dL (ref 0.3–1.2)
Total Protein: 7.1 g/dL (ref 6.5–8.1)

## 2023-03-07 LAB — CBC WITH DIFFERENTIAL (CANCER CENTER ONLY)
Abs Immature Granulocytes: 0.04 10*3/uL (ref 0.00–0.07)
Basophils Absolute: 0.1 10*3/uL (ref 0.0–0.1)
Basophils Relative: 1 %
Eosinophils Absolute: 0.2 10*3/uL (ref 0.0–0.5)
Eosinophils Relative: 2 %
HCT: 42.5 % (ref 36.0–46.0)
Hemoglobin: 14.7 g/dL (ref 12.0–15.0)
Immature Granulocytes: 0 %
Lymphocytes Relative: 13 %
Lymphs Abs: 1.2 10*3/uL (ref 0.7–4.0)
MCH: 28.3 pg (ref 26.0–34.0)
MCHC: 34.6 g/dL (ref 30.0–36.0)
MCV: 81.9 fL (ref 80.0–100.0)
Monocytes Absolute: 0.8 10*3/uL (ref 0.1–1.0)
Monocytes Relative: 9 %
Neutro Abs: 6.9 10*3/uL (ref 1.7–7.7)
Neutrophils Relative %: 75 %
Platelet Count: 327 10*3/uL (ref 150–400)
RBC: 5.19 MIL/uL — ABNORMAL HIGH (ref 3.87–5.11)
RDW: 12.8 % (ref 11.5–15.5)
WBC Count: 9.1 10*3/uL (ref 4.0–10.5)
nRBC: 0 % (ref 0.0–0.2)

## 2023-03-07 LAB — HEPATITIS B CORE ANTIBODY, TOTAL: Hep B Core Total Ab: NONREACTIVE

## 2023-03-07 LAB — HEPATITIS B SURFACE ANTIGEN: Hepatitis B Surface Ag: NONREACTIVE

## 2023-03-07 LAB — URIC ACID: Uric Acid, Serum: 8.9 mg/dL — ABNORMAL HIGH (ref 2.5–7.1)

## 2023-03-07 LAB — LACTATE DEHYDROGENASE: LDH: 272 U/L — ABNORMAL HIGH (ref 98–192)

## 2023-03-07 MED ORDER — PROCHLORPERAZINE MALEATE 5 MG PO TABS: 5.0000 mg | ORAL_TABLET | Freq: Four times a day (QID) | ORAL | 1 refills | Status: AC | PRN

## 2023-03-07 MED ORDER — PREDNISONE 20 MG PO TABS: 60.0000 mg | ORAL_TABLET | Freq: Every day | ORAL | 5 refills | Status: DC

## 2023-03-07 MED ORDER — LIDOCAINE-PRILOCAINE 2.5-2.5 % EX CREA: 1.0000 | TOPICAL_CREAM | CUTANEOUS | 1 refills | Status: AC

## 2023-03-07 MED ORDER — SODIUM CHLORIDE 0.9 % IV SOLN: INTRAVENOUS | Status: AC

## 2023-03-07 MED ORDER — ZOLEDRONIC ACID 4 MG/100ML IV SOLN
4.0000 mg | Freq: Once | INTRAVENOUS | Status: AC
  Administered 2023-03-07: 4 mg via INTRAVENOUS
  Filled 2023-03-07: qty 100

## 2023-03-07 MED ORDER — ALLOPURINOL 100 MG PO TABS: 100.0000 mg | ORAL_TABLET | Freq: Every day | ORAL | 0 refills | Status: DC

## 2023-03-07 NOTE — Progress Notes (Signed)
START ON PATHWAY REGIMEN - Lymphoma and CLL     A cycle is every 21 days:     Prednisone      Rituximab-xxxx      Cyclophosphamide      Doxorubicin      Vincristine   **Always confirm dose/schedule in your pharmacy ordering system**  Patient Characteristics: Diffuse Large B-Cell Lymphoma or Follicular Lymphoma, Grade 3B, First Line, Stage III and IV Disease Type: Not Applicable Disease Type: Diffuse Large B-Cell Lymphoma Disease Type: Not Applicable Line of therapy: First Line Intent of Therapy: Curative Intent, Discussed with Patient 

## 2023-03-07 NOTE — Progress Notes (Signed)
[facility] OFFICE PROGRESS NOTE   Diagnosis: Non-Hodgkin's lymphoma  INTERVAL HISTORY:   Ms. Amber Park returns as scheduled.  She is here with her daughters.  She reports feeling weak and dizzy this week.  She continues to have pain at the left chest wall.  She is not taking pain medication.  Objective:  Vital signs in last 24 hours:  Blood pressure 119/77, pulse 71, temperature 98.2 F (36.8 C), temperature source Oral, resp. rate 18, height 5\' 2"  (1.575 m), weight 146 lb (66.2 kg), SpO2 98%.    Lymphatics: Firm mass at the medial left axilla extending to the upper lateral aspect of the left breast with associated tenderness Resp: Lungs clear bilaterally Cardio: Regular rate and rhythm GI: No hepatosplenomegaly, nontender Vascular: No leg edema Neuro: Alert and oriented    Lab Results:  Lab Results  Component Value Date   WBC 9.1 03/07/2023   HGB 14.7 03/07/2023   HCT 42.5 03/07/2023   MCV 81.9 03/07/2023   PLT 327 03/07/2023   NEUTROABS 6.9 03/07/2023    CMP  Lab Results  Component Value Date   NA 134 (L) 03/07/2023   K 4.0 03/07/2023   CL 95 (L) 03/07/2023   CO2 31 03/07/2023   GLUCOSE 95 03/07/2023   BUN 22 03/07/2023   CREATININE 1.41 (H) 03/07/2023   CALCIUM 13.6 (HH) 03/07/2023   PROT 7.1 03/07/2023   ALBUMIN 4.2 03/07/2023   AST 34 03/07/2023   ALT 11 03/07/2023   ALKPHOS 73 03/07/2023   BILITOT 0.9 03/07/2023   GFRNONAA 34 (L) 03/07/2023   GFRAA 60 (L) 02/13/2020    Lab Results  Component Value Date   CEA1 1.78 12/04/2020   CEA 1.13 01/13/2023    Lab Results  Component Value Date   INR 1.0 02/21/2023   LABPROT 13.0 02/21/2023    Imaging:  No results found.  Medications: I have reviewed the patient's current medications.   Assessment/Plan: Colon cancer-stage IIIb Cecal mass on colonoscopy 01/03/2020, biopsy confirmed adenocarcinoma CTs 01/11/2020-cecal mass, prominent subcentimeter mesenteric nodes adjacent to the  cecum, multiple tiny pulmonary nodules-metastases not favored Laparoscopic right colectomy 02/10/2020,pT3pN1a, grade 2-grade 3, 1/27 lymph nodes, 1 tumor deposit, MSI-high, loss of MLH1 and PMS 2 expression; MLH1 methylation detected CTs 02/05/2021-no evidence of recurrent disease, stable tiny upper lobe pulmonary nodules considered benign CTs 02/12/2022-nonspecific lymphadenopathy in the mesentery with mild mesenteric stranding, largest nodes measure up to 8 mm with no evidence of metastatic disease in the chest CTs 01/13/2023-new pleural-based masses in the lateral left upper chest with destruction of the left anterior second rib, no other evidence of metastatic disease CT-guided biopsy of left chest wall mass 02/21/2023-B-cell lymphoma with a high Ki-67 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 and history of AVMs/Cameron's erosions Ferrous gluconate 07/24/2020-allergic/infusion reaction with chest and back pain, diarrhea, nausea/vomiting, chills, presyncope, hypotension Feraheme 08/14/2020 and 08/21/2020-tolerated well Hiatal hernia Family history of colon and bladder cancer Transverse colon tubular adenoma on the colonoscopy 01/03/2020 Hypertension Glaucoma Admission 02/12/2022 with choledocholithiasis, status post ERCP and stone removal Left chest wall masses on CT 01/13/2023-CT-guided biopsy of left chest wall mass 02/21/2023 Hypercalcemia 03/07/2023-Zometa and IV fluids  Disposition: Ms. Amber Park has been diagnosed with non-Hodgkin's lymphoma.  She is symptomatic with discomfort related to a left pleural mass and hypercalcemia.  There appears to be at least 2 pleural-based masses at the left chest.  She is scheduled for a staging PET scan on 03/11/2023.  I discussed the diagnosis, prognosis, and  treatment options with Ms. Amber Park and her daughters.  We discussed comfort care versus a trial of systemic therapy.  We also discussed palliative radiation.  Ms. Amber Park would like to proceed with treatment in an attempt  to achieve clinical remission and possible cure.  I recommend R-mini CHOP.  We reviewed potential toxicities associated with this regimen including the chance of nausea/vomiting, mucositis, alopecia, hematologic toxicity, infection, and bleeding.  We discussed the cardiac toxicity associated with doxorubicin.  We discussed the vesicant property of doxorubicin and vincristine.  We reviewed the allergic reaction, atypical infections, CNS toxicity, and activation of hepatitis associated with rituximab.  She agrees to proceed.  She will attend a chemotherapy teaching class today.  Ms. Amber Park will receive intravenous hydration and Zometa today.  I suspect hypercalcemia explains her weakness and "dizziness ".  We discussed the flulike symptoms, osteonecrosis, and renal toxicity associated with the media.  I reviewed Zometa dosing with the [facility] center pharmacy.  I feel the benefit outweighs the risk of Zometa in this setting.  She will be scheduled for an echocardiogram and Port-A-Cath placement with the plan to begin R-mini CHOP on 03/14/2023.  A chemotherapy plan was entered today.  We will follow-up on the double hit FISH panel.  I will contact pathology to confirm the histology is consistent with a large cell lymphoma.  Approximately 45 minutes were spent with the patient today.  The majority the time was used for counseling and coordination of care.  Thornton Papas, MD  03/07/2023  12:31 PM

## 2023-03-07 NOTE — Patient Instructions (Addendum)

## 2023-03-07 NOTE — Telephone Encounter (Signed)
CRITICAL VALUE STICKER  CRITICAL VALUE:calcium 13.6  RECEIVER (on-site recipient of call):Aniyah Nobis M  DATE & TIME NOTIFIED: 03/07/23/1212  MESSENGER (representative from lab):Norva Karvonen   MD NOTIFIED: Truett Perna  TIME OF NOTIFICATION: 1215  RESPONSE:  IVF/Zometa

## 2023-03-07 NOTE — Progress Notes (Signed)
Dr. Truett Perna has ordered 2nd liter NS at [facility] tomorrow over 3 hours.

## 2023-03-08 ENCOUNTER — Other Ambulatory Visit: Payer: Self-pay | Admitting: Oncology

## 2023-03-08 ENCOUNTER — Inpatient Hospital Stay: Payer: Medicare Other

## 2023-03-08 ENCOUNTER — Other Ambulatory Visit: Payer: Self-pay

## 2023-03-08 DIAGNOSIS — I10 Essential (primary) hypertension: Secondary | ICD-10-CM | POA: Diagnosis not present

## 2023-03-08 DIAGNOSIS — Z79899 Other long term (current) drug therapy: Secondary | ICD-10-CM | POA: Diagnosis not present

## 2023-03-08 DIAGNOSIS — C8518 Unspecified B-cell lymphoma, lymph nodes of multiple sites: Secondary | ICD-10-CM | POA: Diagnosis not present

## 2023-03-08 DIAGNOSIS — C8589 Other specified types of non-Hodgkin lymphoma, extranodal and solid organ sites: Secondary | ICD-10-CM | POA: Diagnosis not present

## 2023-03-08 DIAGNOSIS — C8580 Other specified types of non-Hodgkin lymphoma, unspecified site: Secondary | ICD-10-CM

## 2023-03-08 DIAGNOSIS — Z85048 Personal history of other malignant neoplasm of rectum, rectosigmoid junction, and anus: Secondary | ICD-10-CM | POA: Diagnosis not present

## 2023-03-08 DIAGNOSIS — D509 Iron deficiency anemia, unspecified: Secondary | ICD-10-CM | POA: Diagnosis not present

## 2023-03-08 DIAGNOSIS — H409 Unspecified glaucoma: Secondary | ICD-10-CM | POA: Diagnosis not present

## 2023-03-08 DIAGNOSIS — R918 Other nonspecific abnormal finding of lung field: Secondary | ICD-10-CM | POA: Diagnosis not present

## 2023-03-08 MED ORDER — SODIUM CHLORIDE 0.9 % IV SOLN: INTRAVENOUS | Status: AC

## 2023-03-08 NOTE — Patient Instructions (Signed)

## 2023-03-09 ENCOUNTER — Encounter (HOSPITAL_COMMUNITY): Payer: Self-pay

## 2023-03-09 NOTE — H&P (Signed)
Chief Complaint: Chemotherapy access. Request is for portacath placement.   Referring Physician(s): Ladene Artist  Supervising Physician: Irish Lack  Patient Status: [facility] - Out-pt  History of Present Illness: Amber Park is a 89 y.o. female outpatient. History of  HTN, AVM, left bundle branch block, adenocarcinoma of the ascending colon. Non- Hodgkin's lymphoma IR biopsy of left chest wall performed on 8.2.24. Team is requesting portacath placement for chemotherapy access.   Daughter at bedside.  Patient alert and laying in bed,calm. Endorses back pain.  Denies any fevers, headache, chest pain, SOB, cough, abdominal pain, nausea, vomiting or bleeding. Return precautions and treatment recommendations and follow-up discussed with the patient and her daughter. Both who are agreeable with the plan.   Past Medical History:  Diagnosis Date   Anemia    iron deficiency   Anxiety    Arthritis    AVM (arteriovenous malformation)    colon, stomach and bowel ( per progress note)   Cancer (HCC)    hx of skin cancer removed from nose    Family history of adverse reaction to anesthesia    Pt son has PONV   GERD (gastroesophageal reflux disease)    Glaucoma    Hearing aid worn    B/L   History of blood transfusion 1963   History of hiatal hernia    History of left bundle branch block    old ekg 06-23-11 dr Pete Glatter on chart   HOH (hard of hearing)    Hypertension    Pneumonia    hx of pneumonia x 4    PONV (postoperative nausea and vomiting)    Shortness of breath dyspnea    with exertion    Wears glasses     Past Surgical History:  Procedure Laterality Date   ABDOMINAL HYSTERECTOMY     BACK SURGERY     x 2   BIOPSY  01/03/2020   Procedure: BIOPSY;  Surgeon: Lemar Lofty., MD;  Location: [facility] ENDOSCOPY;  Service: Gastroenterology;;   BIOPSY  02/16/2022   Procedure: BIOPSY;  Surgeon: Lemar Lofty., MD;  Location: [facility] ENDOSCOPY;  Service:  Gastroenterology;;   BREAST SURGERY     bilateral cyst removal    COLONOSCOPY WITH PROPOFOL N/A 01/03/2020   Procedure: COLONOSCOPY WITH PROPOFOL;  Surgeon: Lemar Lofty., MD;  Location: [facility] ENDOSCOPY;  Service: Gastroenterology;  Laterality: N/A;   ENTEROSCOPY N/A 09/16/2018   Procedure: ENTEROSCOPY;  Surgeon: Meridee Score Netty Starring., MD;  Location: [facility] ENDOSCOPY;  Service: Gastroenterology;  Laterality: N/A;   ENTEROSCOPY N/A 01/03/2020   Procedure: ENTEROSCOPY;  Surgeon: Meridee Score Netty Starring., MD;  Location: [facility] ENDOSCOPY;  Service: Gastroenterology;  Laterality: N/A;   ERCP N/A 02/16/2022   Procedure: ENDOSCOPIC RETROGRADE CHOLANGIOPANCREATOGRAPHY (ERCP);  Surgeon: Lemar Lofty., MD;  Location: [facility] ENDOSCOPY;  Service: Gastroenterology;  Laterality: N/A;   HOT HEMOSTASIS N/A 09/16/2018   Procedure: HOT HEMOSTASIS (ARGON PLASMA COAGULATION/BICAP);  Surgeon: Lemar Lofty., MD;  Location: [facility] ENDOSCOPY;  Service: Gastroenterology;  Laterality: N/A;   LAPAROSCOPIC PARTIAL COLECTOMY N/A 02/10/2020   Procedure: LAPAROSCOPIC PARTIAL COLECTOMY;  Surgeon: Romie Levee, MD;  Location: [facility] ORS;  Service: General;  Laterality: N/A;   LEFT HEART CATH AND CORONARY ANGIOGRAPHY N/A 02/13/2022   Procedure: LEFT HEART CATH AND CORONARY ANGIOGRAPHY;  Surgeon: Marykay Lex, MD;  Location: [facility] INVASIVE CV LAB;  Service: Cardiovascular;  Laterality: N/A;   POLYPECTOMY  01/03/2020   Procedure: POLYPECTOMY;  Surgeon: Meridee Score Netty Starring., MD;  Location: [facility]  ENDOSCOPY;  Service: Gastroenterology;;   REMOVAL OF STONES  02/16/2022   Procedure: REMOVAL OF STONES;  Surgeon: Meridee Score Netty Starring., MD;  Location: [facility] ENDOSCOPY;  Service: Gastroenterology;;   Dennison Mascot  02/16/2022   Procedure: Dennison Mascot;  Surgeon: Meridee Score Netty Starring., MD;  Location: [facility] ENDOSCOPY;  Service: Gastroenterology;;   SUBMUCOSAL TATTOO INJECTION  01/03/2020   Procedure: SUBMUCOSAL TATTOO INJECTION;  Surgeon:  Lemar Lofty., MD;  Location: [facility] ENDOSCOPY;  Service: Gastroenterology;;   TONSILLECTOMY     TOTAL HIP ARTHROPLASTY Left 05/31/2014   Procedure: LEFT TOTAL HIP ARTHROPLASTY ANTERIOR APPROACH;  Surgeon: Shelda Pal, MD;  Location: [facility] ORS;  Service: Orthopedics;  Laterality: Left;    Allergies: Ferrous gluconate, Anesthesia s-i-40 [propofol], Codeine, Gemtesa [vibegron], Meperidine hcl, Pneumococcal vac polyvalent, and Simvastatin  Medications: Prior to Admission medications   Medication Sig Start Date End Date Taking? Authorizing Provider  acetaminophen (TYLENOL) 500 MG tablet Take 500 mg by mouth daily as needed for mild pain.    [provider]  allopurinol (ZYLOPRIM) 100 MG tablet Take 1 tablet (100 mg total) by mouth daily. Take daily x 10 days. Start on 03/13/23 03/07/23   Ladene Artist, MD  amLODipine (NORVASC) 10 MG tablet Take 10 mg by mouth daily. 08/28/18   [provider]  aspirin EC 81 MG tablet Take 1 tablet (81 mg total) by mouth daily. Swallow whole. 02/18/22   Barnetta Chapel, MD  Cholecalciferol (VITAMIN D3) 50 MCG (2000 UT) capsule Take 2,000 Units by mouth daily.    [provider]  hydrochlorothiazide (HYDRODIURIL) 25 MG tablet Take 25 mg by mouth daily. 06/15/19   [provider]  latanoprost (XALATAN) 0.005 % ophthalmic solution Place 1 drop into both eyes at bedtime.  09/11/18   [provider]  lidocaine-prilocaine (EMLA) cream Apply 1 Application topically as directed. Apply 1/2 tablespoon to port site 1-2 hours prior to stick and cover with Press-and-Seal. Do not start using until 14 days after port placed. 03/07/23   Ladene Artist, MD  metoprolol tartrate (LOPRESSOR) 25 MG tablet Take 1 tablet (25 mg total) by mouth 2 (two) times daily. 02/17/22 03/07/23  Berton Mount I, MD  pantoprazole (PROTONIX) 40 MG tablet TAKE 1 TABLET(40 MG) BY MOUTH DAILY 11/29/22   Mansouraty, Netty Starring., MD  potassium chloride  (KLOR-CON) 10 MEQ tablet Take 10 mEq by mouth daily.    [provider]  predniSONE (DELTASONE) 20 MG tablet Take 3 tablets (60 mg total) by mouth daily with breakfast. Take 60 mg daily x 4 days after each chemotherapy beginning on day 2 of each cycle 03/07/23   Ladene Artist, MD  prochlorperazine (COMPAZINE) 5 MG tablet Take 1 tablet (5 mg total) by mouth every 6 (six) hours as needed for nausea or vomiting. 03/07/23   Ladene Artist, MD  tiZANidine (ZANAFLEX) 4 MG tablet Take 1 tablet (4 mg total) by mouth every 8 (eight) hours as needed for muscle spasms. Patient taking differently: Take 4 mg by mouth at bedtime. 06/02/14   Lanney Gins, PA-C     Family History  Problem Relation Age of Onset   Colon cancer Mother 49       Died age 72   Heart attack Father 64       Died in his sleep of an MI   Lung cancer Sister        LLL resection, otherwise no treatment; now recurred   Bladder Cancer Sister  Likely primary   Renal cancer Sister    Breast cancer Sister    Heart disease Brother    Heart disease Brother    Esophageal cancer Neg Hx    Inflammatory bowel disease Neg Hx    Liver disease Neg Hx    Pancreatic cancer Neg Hx    Stomach cancer Neg Hx     Social History   Socioeconomic History   Marital status: Widowed    Spouse name: Not on file   Number of children: 3   Years of education: Not on file   Highest education level: Not on file  Occupational History   Occupation: Retired Office manager at [employer]  Tobacco Use   Smoking status: Former    Current packs/day: 0.00    Average packs/day: 0.3 packs/day for 50.0 years (15.0 ttl pk-yrs)    Types: Cigarettes    Start date: 35    Quit date: 2005    Years since quitting: 19.6   Smokeless tobacco: Never  Vaping Use   Vaping status: Never Used  Substance and Sexual Activity   Alcohol use: Not Currently    Comment: rare    Drug use: No   Sexual activity: Not on file  Other Topics Concern    Not on file  Social History Narrative   Not on file   Social Determinants of Health   Financial Resource Strain: Not on file  Food Insecurity: Not on file  Transportation Needs: Not on file  Physical Activity: Not on file  Stress: Not on file  Social Connections: Not on file     Review of Systems: A 12 point ROS discussed and pertinent positives are indicated in the HPI above.  All other systems are negative.  Review of Systems  Constitutional:  Negative for fatigue and fever.  HENT:  Negative for congestion.   Respiratory:  Negative for cough and shortness of breath.   Gastrointestinal:  Negative for abdominal pain, diarrhea, nausea and vomiting.  Musculoskeletal:  Positive for back pain.    Vital Signs: BP (!) 162/54   Pulse 77   Temp 98 F (36.7 C) (Temporal)   Resp 20   Ht 5\' 2"  (1.575 m)   Wt 145 lb 8.1 oz (66 kg)   SpO2 96%   BMI 26.61 kg/m   Advance Care Plan: The advanced care plan/surrogate decision maker was discussed at the time of visit and documented in the medical record.    Physical Exam Vitals and nursing note reviewed.  Constitutional:      Appearance: She is well-developed. She is obese.  HENT:     Head: Normocephalic and atraumatic.  Eyes:     Conjunctiva/sclera: Conjunctivae normal.  Cardiovascular:     Rate and Rhythm: Normal rate and regular rhythm.     Heart sounds: Normal heart sounds.  Pulmonary:     Effort: Pulmonary effort is normal.     Breath sounds: Normal breath sounds.  Musculoskeletal:        General: Normal range of motion.     Cervical back: Normal range of motion.  Skin:    General: Skin is warm and dry.  Neurological:     General: No focal deficit present.     Mental Status: She is alert and oriented to person, place, and time.  Psychiatric:        Mood and Affect: Mood normal.        Behavior: Behavior normal.  Thought Content: Thought content normal.        Judgment: Judgment normal.     Imaging: CT  Biopsy  Result Date: 02/21/2023 CLINICAL DATA:  Left chest wall mass.  History of colon carcinoma EXAM: CT GUIDED CORE BIOPSY OF LEFT CHEST WALL MASS ANESTHESIA/SEDATION: Intravenous Fentanyl and Versed 1mg  were administered as conscious sedation during continuous monitoring of the patient's level of consciousness and physiological / cardiorespiratory status by the radiology RN, with a total moderate sedation time of 11 minutes. PROCEDURE: The procedure risks, benefits, and alternatives were explained to the patient. Questions regarding the procedure were encouraged and answered. The patient understands and consents to the procedure. Patient placed right lateral decubitus. Select axial scans through the thorax obtained. The lesion was localized and appropriate skin entry site determined and marked. The operative field was prepped with chlorhexidinein a sterile fashion, and a sterile drape was applied covering the operative field. A sterile gown and sterile gloves were used for the procedure. Local anesthesia was provided with 1% Lidocaine. Under CT fluoroscopic guidance, a 17 gauge trocar needle was advanced to the margin of the lesion. Once needle tip position was confirmed, coaxial 18-gauge core biopsy samples were obtained, submitted in formalin to surgical pathology. The guide needle was removed. Postprocedure scans show no hemorrhage, pneumothorax, pleural effusion, or other immediate complication. The patient tolerated the procedure well. COMPLICATIONS: None immediate FINDINGS: Left chest wall mass localized. Representative core biopsy samples obtained as above. IMPRESSION: Technically successful CT-guided core biopsy, left chest wall mass. RADIATION DOSE REDUCTION: This exam was performed according to the departmental dose-optimization program which includes automated exposure control, adjustment of the mA and/or kV according to patient size and/or use of iterative reconstruction technique.  Electronically Signed   By: Corlis Leak M.D.   On: 02/21/2023 12:24    Labs:  CBC: Recent Labs    09/02/22 1118 02/07/23 1011 02/21/23 0849 03/07/23 1112  WBC 7.8 10.2 8.0 9.1  HGB 15.0 15.4* 14.8 14.7  HCT 44.2 44.9 44.1 42.5  PLT 276 288 292 327    COAGS: Recent Labs    02/07/23 1011 02/21/23 0849  INR 0.9 1.0    BMP: Recent Labs    03/07/23 1112  NA 134*  K 4.0  CL 95*  CO2 31  GLUCOSE 95  BUN 22  CALCIUM 13.6*  CREATININE 1.41*  GFRNONAA 34*    LIVER FUNCTION TESTS: Recent Labs    07/23/22 1150 03/07/23 1112  BILITOT 0.7 0.9  AST 24 34  ALT 12 11  ALKPHOS 84 73  PROT 7.2 7.1  ALBUMIN 4.2 4.2    TUMOR MARKERS: Recent Labs    09/02/22 1118 01/13/23 1256  CEA 1.45 1.13    Assessment and Plan:  89 y.o. female outpatient. History of  HTN, AVM, left bundle branch block, adenocarcinoma of the ascending colon. Non- Hodgkin's lymphoma IR biopsy of left chest wall performed on 8.2.24. Team is requesting portacath placement for chemotherapy access.   Per EPIC no line history. CT CAP from 6.24.24 shows RIJ is accessible. No recent labs. Allergies include codeine. Patient has been NPO since midnight.    Risks and benefits of image guided port-a-catheter placement was discussed with the patient including, but not limited to bleeding, infection, pneumothorax, or fibrin sheath development and need for additional procedures.  All of the patient's questions were answered, patient is agreeable to proceed.  Consent signed and in chart.   Thank you for this interesting consult.  I greatly enjoyed meeting Amber Park and look forward to participating in their care.  A copy of this report was sent to the requesting provider on this date.  Electronically Signed: Alene Mires, NP 03/10/2023, 1:30 PM   I spent a total of  30 Minutes   in face to face in clinical consultation, greater than 50% of which was counseling/coordinating care for porta cath  placement

## 2023-03-10 ENCOUNTER — Ambulatory Visit (HOSPITAL_COMMUNITY)
Admission: RE | Admit: 2023-03-10 | Discharge: 2023-03-10 | Disposition: A | Discharge status: Home / Self Care | Payer: Medicare Other | Source: Ambulatory Visit | Attending: Oncology | Admitting: Oncology

## 2023-03-10 ENCOUNTER — Other Ambulatory Visit: Payer: Self-pay

## 2023-03-10 ENCOUNTER — Ambulatory Visit (HOSPITAL_BASED_OUTPATIENT_CLINIC_OR_DEPARTMENT_OTHER)
Admission: RE | Admit: 2023-03-10 | Discharge: 2023-03-10 | Disposition: A | Discharge status: Home / Self Care | Payer: Medicare Other | Source: Ambulatory Visit | Attending: Oncology | Admitting: Oncology

## 2023-03-10 DIAGNOSIS — C8510 Unspecified B-cell lymphoma, unspecified site: Secondary | ICD-10-CM | POA: Diagnosis not present

## 2023-03-10 DIAGNOSIS — Z87891 Personal history of nicotine dependence: Secondary | ICD-10-CM | POA: Diagnosis not present

## 2023-03-10 DIAGNOSIS — C182 Malignant neoplasm of ascending colon: Secondary | ICD-10-CM | POA: Diagnosis not present

## 2023-03-10 DIAGNOSIS — M549 Dorsalgia, unspecified: Secondary | ICD-10-CM | POA: Diagnosis not present

## 2023-03-10 DIAGNOSIS — I340 Nonrheumatic mitral (valve) insufficiency: Secondary | ICD-10-CM | POA: Insufficient documentation

## 2023-03-10 DIAGNOSIS — I10 Essential (primary) hypertension: Secondary | ICD-10-CM | POA: Diagnosis not present

## 2023-03-10 DIAGNOSIS — I447 Left bundle-branch block, unspecified: Secondary | ICD-10-CM | POA: Diagnosis not present

## 2023-03-10 DIAGNOSIS — C8590 Non-Hodgkin lymphoma, unspecified, unspecified site: Secondary | ICD-10-CM | POA: Diagnosis not present

## 2023-03-10 DIAGNOSIS — Z0189 Encounter for other specified special examinations: Secondary | ICD-10-CM

## 2023-03-10 HISTORY — PX: IR IMAGING GUIDED PORT INSERTION: IMG5740

## 2023-03-10 LAB — COMPREHENSIVE METABOLIC PANEL
ALT: 18 U/L (ref 0–44)
AST: 44 U/L — ABNORMAL HIGH (ref 15–41)
Albumin: 3.3 g/dL — ABNORMAL LOW (ref 3.5–5.0)
Alkaline Phosphatase: 64 U/L (ref 38–126)
Anion gap: 13 (ref 5–15)
BUN: 17 mg/dL (ref 8–23)
CO2: 22 mmol/L (ref 22–32)
Calcium: 10 mg/dL (ref 8.9–10.3)
Chloride: 102 mmol/L (ref 98–111)
Creatinine, Ser: 1.26 mg/dL — ABNORMAL HIGH (ref 0.44–1.00)
GFR, Estimated: 39 mL/min — ABNORMAL LOW (ref >60)
Glucose, Bld: 81 mg/dL (ref 70–99)
Potassium: 3.5 mmol/L (ref 3.5–5.1)
Sodium: 137 mmol/L (ref 135–145)
Total Bilirubin: 0.9 mg/dL (ref 0.3–1.2)
Total Protein: 6.2 g/dL — ABNORMAL LOW (ref 6.5–8.1)

## 2023-03-10 LAB — ECHOCARDIOGRAM COMPLETE
Area-P 1/2: 2.72 cm2
Est EF: 65
MV VTI: 2.27 cm2
S' Lateral: 2 cm

## 2023-03-10 MED ORDER — FENTANYL CITRATE (PF) 100 MCG/2ML IJ SOLN
INTRAMUSCULAR | Status: AC
  Filled 2023-03-10: qty 2

## 2023-03-10 MED ORDER — HEPARIN SOD (PORK) LOCK FLUSH 100 UNIT/ML IV SOLN
INTRAVENOUS | Status: AC
  Filled 2023-03-10: qty 5

## 2023-03-10 MED ORDER — SODIUM CHLORIDE 0.9 % IV SOLN: INTRAVENOUS | Status: DC

## 2023-03-10 MED ORDER — MIDAZOLAM HCL 2 MG/2ML IJ SOLN
INTRAMUSCULAR | Status: AC | PRN
  Administered 2023-03-10 (×2): .5 mg via INTRAVENOUS
  Administered 2023-03-10: 1 mg via INTRAVENOUS

## 2023-03-10 MED ORDER — LIDOCAINE HCL 1 % IJ SOLN
INTRAMUSCULAR | Status: AC
  Filled 2023-03-10: qty 20

## 2023-03-10 MED ORDER — FENTANYL CITRATE (PF) 100 MCG/2ML IJ SOLN
INTRAMUSCULAR | Status: AC | PRN
  Administered 2023-03-10 (×4): 25 ug via INTRAVENOUS

## 2023-03-10 MED ORDER — LIDOCAINE HCL 1 % IJ SOLN
20.0000 mL | Freq: Once | INTRAMUSCULAR | Status: AC
  Administered 2023-03-10: 20 mL via INTRADERMAL

## 2023-03-10 MED ORDER — MIDAZOLAM HCL 2 MG/2ML IJ SOLN
INTRAMUSCULAR | Status: AC
  Filled 2023-03-10: qty 2

## 2023-03-10 NOTE — Progress Notes (Signed)
Pharmacist Chemotherapy Monitoring - Initial Assessment    Anticipated start date: 03/14/23   The following has been reviewed per standard work regarding the patient's treatment regimen: The patient's diagnosis, treatment plan and drug doses, and organ/hematologic function Lab orders and baseline tests specific to treatment regimen  The treatment plan start date, drug sequencing, and pre-medications Prior authorization status  Patient's documented medication list, including drug-drug interaction screen and prescriptions for anti-emetics and supportive care specific to the treatment regimen The drug concentrations, fluid compatibility, administration routes, and timing of the medications to be used The patient's access for treatment and lifetime cumulative dose history, if applicable  The patient's medication allergies and previous infusion related reactions, if applicable   Changes made to treatment plan:  N/A  Follow up needed:  Pending authorization for treatment  ECHO on 8/19, review on verify   [name], [facility], 03/10/2023  3:40 PM

## 2023-03-10 NOTE — Procedures (Signed)
Interventional Radiology Procedure Note  Procedure: Single Lumen Power Port Placement    Access:  Right IJ vein.  Findings: Catheter tip positioned at SVC/RA junction. Port is ready for immediate use.   Complications: None  EBL: < 10 mL  Recommendations:  - Ok to shower in 24 hours - Do not submerge for 7 days - Routine line care   Glenn T. Yamagata, M.D Pager:  [phone]   

## 2023-03-11 ENCOUNTER — Ambulatory Visit
Admission: RE | Admit: 2023-03-11 | Discharge: 2023-03-11 | Disposition: A | Discharge status: Home / Self Care | Payer: Medicare Other | Source: Ambulatory Visit | Attending: Oncology | Admitting: Oncology

## 2023-03-11 ENCOUNTER — Other Ambulatory Visit: Payer: Medicare Other

## 2023-03-11 ENCOUNTER — Ambulatory Visit: Payer: Medicare Other | Admitting: Oncology

## 2023-03-11 ENCOUNTER — Encounter (HOSPITAL_COMMUNITY): Payer: Self-pay | Admitting: Oncology

## 2023-03-11 DIAGNOSIS — C8592 Non-Hodgkin lymphoma, unspecified, intrathoracic lymph nodes: Secondary | ICD-10-CM | POA: Diagnosis not present

## 2023-03-11 DIAGNOSIS — C19 Malignant neoplasm of rectosigmoid junction: Secondary | ICD-10-CM | POA: Diagnosis not present

## 2023-03-11 DIAGNOSIS — C182 Malignant neoplasm of ascending colon: Secondary | ICD-10-CM | POA: Diagnosis not present

## 2023-03-11 LAB — GLUCOSE, CAPILLARY: Glucose-Capillary: 76 mg/dL (ref 70–99)

## 2023-03-11 MED ORDER — FLUDEOXYGLUCOSE F - 18 (FDG) INJECTION
7.5000 | Freq: Once | INTRAVENOUS | Status: AC | PRN
  Administered 2023-03-11: 7.74 via INTRAVENOUS

## 2023-03-12 ENCOUNTER — Inpatient Hospital Stay: Payer: Medicare Other

## 2023-03-12 ENCOUNTER — Inpatient Hospital Stay: Payer: Medicare Other | Admitting: Oncology

## 2023-03-14 ENCOUNTER — Inpatient Hospital Stay: Payer: Medicare Other

## 2023-03-14 ENCOUNTER — Inpatient Hospital Stay: Payer: Medicare Other | Admitting: Oncology

## 2023-03-14 VITALS — BP 136/67 | HR 71 | Temp 98.1°F | Resp 18 | Ht 62.0 in | Wt 148.1 lb

## 2023-03-14 DIAGNOSIS — H409 Unspecified glaucoma: Secondary | ICD-10-CM | POA: Diagnosis not present

## 2023-03-14 DIAGNOSIS — Z85048 Personal history of other malignant neoplasm of rectum, rectosigmoid junction, and anus: Secondary | ICD-10-CM | POA: Diagnosis not present

## 2023-03-14 DIAGNOSIS — C8580 Other specified types of non-Hodgkin lymphoma, unspecified site: Secondary | ICD-10-CM

## 2023-03-14 DIAGNOSIS — I10 Essential (primary) hypertension: Secondary | ICD-10-CM | POA: Diagnosis not present

## 2023-03-14 DIAGNOSIS — C8589 Other specified types of non-Hodgkin lymphoma, extranodal and solid organ sites: Secondary | ICD-10-CM | POA: Diagnosis not present

## 2023-03-14 DIAGNOSIS — Z79899 Other long term (current) drug therapy: Secondary | ICD-10-CM | POA: Diagnosis not present

## 2023-03-14 DIAGNOSIS — C8518 Unspecified B-cell lymphoma, lymph nodes of multiple sites: Secondary | ICD-10-CM | POA: Diagnosis not present

## 2023-03-14 DIAGNOSIS — R918 Other nonspecific abnormal finding of lung field: Secondary | ICD-10-CM | POA: Diagnosis not present

## 2023-03-14 DIAGNOSIS — D509 Iron deficiency anemia, unspecified: Secondary | ICD-10-CM | POA: Diagnosis not present

## 2023-03-14 LAB — CMP (CANCER CENTER ONLY)
ALT: 15 U/L (ref 0–44)
AST: 42 U/L — ABNORMAL HIGH (ref 15–41)
Albumin: 4.1 g/dL (ref 3.5–5.0)
Alkaline Phosphatase: 65 U/L (ref 38–126)
Anion gap: 12 (ref 5–15)
BUN: 19 mg/dL (ref 8–23)
CO2: 23 mmol/L (ref 22–32)
Calcium: 10.6 mg/dL — ABNORMAL HIGH (ref 8.9–10.3)
Chloride: 103 mmol/L (ref 98–111)
Creatinine: 1.31 mg/dL — ABNORMAL HIGH (ref 0.44–1.00)
GFR, Estimated: 38 mL/min — ABNORMAL LOW (ref >60)
Glucose, Bld: 85 mg/dL (ref 70–99)
Potassium: 3.9 mmol/L (ref 3.5–5.1)
Sodium: 138 mmol/L (ref 135–145)
Total Bilirubin: 0.7 mg/dL (ref 0.3–1.2)
Total Protein: 6.4 g/dL — ABNORMAL LOW (ref 6.5–8.1)

## 2023-03-14 LAB — CBC WITH DIFFERENTIAL (CANCER CENTER ONLY)
Abs Immature Granulocytes: 0.03 10*3/uL (ref 0.00–0.07)
Basophils Absolute: 0.1 10*3/uL (ref 0.0–0.1)
Basophils Relative: 1 %
Eosinophils Absolute: 0.3 10*3/uL (ref 0.0–0.5)
Eosinophils Relative: 3 %
HCT: 38.5 % (ref 36.0–46.0)
Hemoglobin: 13.2 g/dL (ref 12.0–15.0)
Immature Granulocytes: 0 %
Lymphocytes Relative: 11 %
Lymphs Abs: 0.9 10*3/uL (ref 0.7–4.0)
MCH: 28.5 pg (ref 26.0–34.0)
MCHC: 34.3 g/dL (ref 30.0–36.0)
MCV: 83.2 fL (ref 80.0–100.0)
Monocytes Absolute: 0.8 10*3/uL (ref 0.1–1.0)
Monocytes Relative: 9 %
Neutro Abs: 6.6 10*3/uL (ref 1.7–7.7)
Neutrophils Relative %: 76 %
Platelet Count: 276 10*3/uL (ref 150–400)
RBC: 4.63 MIL/uL (ref 3.87–5.11)
RDW: 13.2 % (ref 11.5–15.5)
WBC Count: 8.7 10*3/uL (ref 4.0–10.5)
nRBC: 0 % (ref 0.0–0.2)

## 2023-03-14 MED ORDER — ACETAMINOPHEN 325 MG PO TABS
650.0000 mg | ORAL_TABLET | Freq: Once | ORAL | Status: AC
  Administered 2023-03-14: 650 mg via ORAL
  Filled 2023-03-14: qty 2

## 2023-03-14 MED ORDER — HEPARIN SOD (PORK) LOCK FLUSH 100 UNIT/ML IV SOLN
500.0000 [IU] | Freq: Once | INTRAVENOUS | Status: AC | PRN
  Administered 2023-03-14: 500 [IU]

## 2023-03-14 MED ORDER — PALONOSETRON HCL INJECTION 0.25 MG/5ML
0.2500 mg | Freq: Once | INTRAVENOUS | Status: AC
  Administered 2023-03-14: 0.25 mg via INTRAVENOUS
  Filled 2023-03-14: qty 5

## 2023-03-14 MED ORDER — DIPHENHYDRAMINE HCL 25 MG PO CAPS
25.0000 mg | ORAL_CAPSULE | Freq: Once | ORAL | Status: AC
  Administered 2023-03-14: 25 mg via ORAL
  Filled 2023-03-14: qty 1

## 2023-03-14 MED ORDER — DOXORUBICIN HCL CHEMO IV INJECTION 2 MG/ML
25.0000 mg/m2 | Freq: Once | INTRAVENOUS | Status: AC
  Administered 2023-03-14: 42 mg via INTRAVENOUS
  Filled 2023-03-14: qty 21

## 2023-03-14 MED ORDER — VINCRISTINE SULFATE CHEMO INJECTION 1 MG/ML
1.0000 mg | Freq: Once | INTRAVENOUS | Status: AC
  Administered 2023-03-14: 1 mg via INTRAVENOUS
  Filled 2023-03-14: qty 1

## 2023-03-14 MED ORDER — SODIUM CHLORIDE 0.9 % IV SOLN: Freq: Once | INTRAVENOUS | Status: AC

## 2023-03-14 MED ORDER — SODIUM CHLORIDE 0.9 % IV SOLN
375.0000 mg/m2 | Freq: Once | INTRAVENOUS | Status: AC
  Administered 2023-03-14: 600 mg via INTRAVENOUS
  Filled 2023-03-14: qty 50

## 2023-03-14 MED ORDER — SODIUM CHLORIDE 0.9 % IV SOLN
10.0000 mg | Freq: Once | INTRAVENOUS | Status: AC
  Administered 2023-03-14: 10 mg via INTRAVENOUS
  Filled 2023-03-14: qty 10

## 2023-03-14 MED ORDER — SODIUM CHLORIDE 0.9 % IV SOLN
150.0000 mg | Freq: Once | INTRAVENOUS | Status: AC
  Administered 2023-03-14: 150 mg via INTRAVENOUS
  Filled 2023-03-14: qty 150

## 2023-03-14 MED ORDER — SODIUM CHLORIDE 0.9 % IV SOLN
300.0000 mg/m2 | Freq: Once | INTRAVENOUS | Status: AC
  Administered 2023-03-14: 500 mg via INTRAVENOUS
  Filled 2023-03-14: qty 25

## 2023-03-14 MED ORDER — SODIUM CHLORIDE 0.9% FLUSH
10.0000 mL | INTRAVENOUS | Status: DC | PRN
  Administered 2023-03-14: 10 mL

## 2023-03-14 NOTE — Patient Instructions (Signed)

## 2023-03-14 NOTE — Progress Notes (Signed)
Amber Park   Diagnosis: Non-Hodgkin's lymphoma  INTERVAL HISTORY:   Amber Park returns as scheduled.  She continues to have discomfort at the left chest.  She underwent Port-A-Cath placement 03/10/2023.  The right anterior chest wall is bruised and she developed erythema and blistering beneath the dressing.  Objective:  Vital signs in last 24 hours:  There were no vitals taken for this visit.   Lymphatics: Firm mass at the left axilla/anterolateral chest wall Resp: Lungs clear bilaterally Cardio: Regular rate and rhythm GI: Nontender, no hepatosplenomegaly Vascular: No leg edema    Portacath/PICC-fading ecchymosis at the Port-A-Cath site.  Erythema and superficial ulceration in a tape distribution over the right anterior chest wall  Lab Results:  Lab Results  Component Value Date   WBC 8.7 03/14/2023   HGB 13.2 03/14/2023   HCT 38.5 03/14/2023   MCV 83.2 03/14/2023   PLT 276 03/14/2023   NEUTROABS 6.6 03/14/2023    CMP  Lab Results  Component Value Date   NA 138 03/14/2023   K 3.9 03/14/2023   CL 103 03/14/2023   CO2 23 03/14/2023   GLUCOSE 85 03/14/2023   BUN 19 03/14/2023   CREATININE 1.31 (H) 03/14/2023   CALCIUM 10.6 (H) 03/14/2023   PROT 6.4 (L) 03/14/2023   ALBUMIN 4.1 03/14/2023   AST 42 (H) 03/14/2023   ALT 15 03/14/2023   ALKPHOS 65 03/14/2023   BILITOT 0.7 03/14/2023   GFRNONAA 38 (L) 03/14/2023   GFRAA 60 (L) 02/13/2020    Lab Results  Component Value Date   CEA1 1.78 12/04/2020   CEA 1.13 01/13/2023    Imaging:  NM PET Image Initial (PI) Skull Base To Thigh (F-18 FDG)  Result Date: 03/14/2023 CLINICAL DATA:  Initial treatment strategy for lymphoma. History of colorectal carcinoma EXAM: NUCLEAR MEDICINE PET SKULL BASE TO THIGH TECHNIQUE: 7.7 mCi F-18 FDG was injected intravenously. Full-ring PET imaging was performed from the skull base to thigh after the radiotracer. CT data was obtained and used for  attenuation correction and anatomic localization. Fasting blood glucose: 76 mg/dl COMPARISON:  CT 47/82/9562 FINDINGS: Mediastinal blood pool activity: SUV max 2.5 Liver activity: SUV max NA NECK: No hypermetabolic lymph nodes in the neck. Incidental CT findings: None. CHEST: Intensely hypermetabolic LEFT chest wall mass measures approximately 8.9 by 5.0 cm with SUV max equal 31. Mass extends into the sub pectoralis tissue, involves the LEFT anterior second rib and has hypermetabolic pleural thickening in the LEFT upper lobe. Mass measures approximately 11 cm in craniocaudad dimension. Three Incidental CT findings: Port in the anterior chest wall with tip in distal SVC. ABDOMEN/PELVIS: No abnormal hypermetabolic activity within the liver, pancreas, adrenal glands, or spleen. No hypermetabolic lymph nodes in the abdomen or pelvis. Incidental CT findings: Post RIGHT hemicolectomy. Atherosclerotic calcification of the aorta. SKELETON: No focal hypermetabolic activity to suggest skeletal metastasis. Incidental CT findings: LEFT hip prosthetic IMPRESSION: 1. Intensely hypermetabolic LEFT chest wall mass consistent with lymphoma. 2. No evidence of lymphoma  separate from the large chest wall mass. 3. Post RIGHT hemicolectomy anatomy. No evidence of colorectal cancer recurrence. Electronically Signed   By: Genevive Bi M.D.   On: 03/14/2023 10:05   IR IMAGING GUIDED PORT INSERTION  Result Date: 03/10/2023 CLINICAL DATA:  B-cell lymphoma and need for porta cath to begin chemotherapy. EXAM: IMPLANTED PORT A CATH PLACEMENT WITH ULTRASOUND AND FLUOROSCOPIC GUIDANCE ANESTHESIA/SEDATION: Moderate (conscious) sedation was employed during this procedure. A total of Versed 2.0  mg and Fentanyl 100 mcg was administered intravenously by radiology nursing. Moderate Sedation Time: 31 minutes. The patient's level of consciousness and vital signs were monitored continuously by radiology nursing throughout the procedure under my  direct supervision. FLUOROSCOPY: 18 seconds.  1.0 mGy. PROCEDURE: The procedure, risks, benefits, and alternatives were explained to the patient. Questions regarding the procedure were encouraged and answered. The patient understands and consents to the procedure. A time-out was performed prior to initiating the procedure. Ultrasound was utilized to confirm patency of the right internal jugular vein. The right neck and chest were prepped with chlorhexidine in a sterile fashion, and a sterile drape was applied covering the operative field. Maximum barrier sterile technique with sterile gowns and gloves were used for the procedure. Local anesthesia was provided with 1% lidocaine. After creating a small venotomy incision, a 21 gauge needle was advanced into the right internal jugular vein under direct, real-time ultrasound guidance. Ultrasound image documentation was performed. After securing guidewire access, an 8 Fr dilator was placed. A J-wire was kinked to measure appropriate catheter length. A subcutaneous port pocket was then created along the upper chest wall utilizing sharp and blunt dissection. Portable cautery was utilized. The pocket was irrigated with sterile saline. A single lumen power injectable port was chosen for placement. The 8 Fr catheter was tunneled from the port pocket site to the venotomy incision. The port was placed in the pocket. External catheter was trimmed to appropriate length based on guidewire measurement. At the venotomy, an 8 Fr peel-away sheath was placed over a guidewire. The catheter was then placed through the sheath and the sheath removed. Final catheter positioning was confirmed and documented with a fluoroscopic spot image. The port was accessed with a needle and aspirated and flushed with heparinized saline. The access needle was removed. The venotomy and port pocket incisions were closed with subcutaneous 3-0 Monocryl and subcuticular 4-0 Vicryl. Dermabond was applied to both  incisions. COMPLICATIONS: COMPLICATIONS None FINDINGS: After catheter placement, the tip lies at the cavo-atrial junction. The catheter aspirates normally and is ready for immediate use. IMPRESSION: Placement of single lumen port a cath via right internal jugular vein. The catheter tip lies at the cavo-atrial junction. A power injectable port a cath was placed and is ready for immediate use. Electronically Signed   By: Irish Lack M.D.   On: 03/10/2023 16:50    Medications: I have reviewed the patient's current medications.   Assessment/Plan: Colon cancer-stage IIIb Cecal mass on colonoscopy 01/03/2020, biopsy confirmed adenocarcinoma CTs 01/11/2020-cecal mass, prominent subcentimeter mesenteric nodes adjacent to the cecum, multiple tiny pulmonary nodules-metastases not favored Laparoscopic right colectomy 02/10/2020,pT3pN1a, grade 2-grade 3, 1/27 lymph nodes, 1 tumor deposit, MSI-high, loss of MLH1 and PMS 2 expression; MLH1 methylation detected CTs 02/05/2021-no evidence of recurrent disease, stable tiny upper lobe pulmonary nodules considered benign CTs 02/12/2022-nonspecific lymphadenopathy in the mesentery with mild mesenteric stranding, largest nodes measure up to 8 mm with no evidence of metastatic disease in the chest CTs 01/13/2023-new pleural-based masses in the lateral left upper chest with destruction of the left anterior second rib, no other evidence of metastatic disease CT-guided biopsy of left chest wall mass 02/21/2023-B-cell lymphoma with a high Ki-67, Bcl-2 positive, negative for double hit or triple hit lymphoma, large B-cell lymphoma per communication with the pathologist PET 03/11/2023-intense hypermetabolic activity associated with left chest wall mass, no evidence of additional sites of lymphoma, no evidence of recurrent colon cancer Cycle 1R-mini CHOP 03/14/2023 Iron deficiency anemia secondary to #  1 and history of AVMs/Cameron's erosions Ferrous gluconate  07/24/2020-allergic/infusion reaction with chest and back pain, diarrhea, nausea/vomiting, chills, presyncope, hypotension Feraheme 08/14/2020 and 08/21/2020-tolerated well Hiatal hernia Family history of colon and bladder cancer Transverse colon tubular adenoma on the colonoscopy 01/03/2020 Hypertension Glaucoma Admission 02/12/2022 with choledocholithiasis, status post ERCP and stone removal Left chest wall masses on CT 01/13/2023-CT-guided biopsy of left chest wall mass 02/21/2023 Hypercalcemia 03/07/2023-Zometa and IV fluids Echocardiogram 03/10/2023-LVEF 65%, normal left and right ventricular function    Disposition: Amber Park has been diagnosed with large B-cell lymphoma.  She is symptomatic with an enlarging/painful mass at the left chest wall.  She has hypercalcemia.  The CT in June revealed evidence of multiple chest wall based lesions.  The staging PET reveals no other evidence of lymphoma.  I recommend systemic therapy.  The plan is to begin R-mini CHOP today.  The doxorubicin and cyclophosphamide are dose reduced and this regimen.  We decided to hold on G-CSF support with cycle 1.  She will return for a nadir CBC in 1 week.  She began allopurinol prophylaxis yesterday.  She will take prednisone for 4 days beginning tomorrow.  I reviewed the PET findings and images with Amber Park and Amber Park.  She will be scheduled for office visit and cycle 2R-mini CHOP on 04/07/2023.  We hope the chest wall discomfort will improve over the next few days.  Thornton Papas, MD  03/14/2023  3:01 PM

## 2023-03-14 NOTE — Patient Instructions (Signed)
[place] [facility]  Discharge Instructions: Thank you for choosing [facility] to provide your oncology and hematology care.   If you have a lab appointment with the Cancer Center, please go directly to the Cancer Center and check in at the registration area.   Wear comfortable clothing and clothing appropriate for easy access to any Portacath or PICC line.   We strive to give you quality time with your provider. You may need to reschedule your appointment if you arrive late (15 or more minutes).  Arriving late affects you and other patients whose appointments are after yours.  Also, if you miss three or more appointments without notifying the office, you may be dismissed from the clinic at the provider's discretion.      For prescription refill requests, have your pharmacy contact our office and allow 72 hours for refills to be completed.    Today you received the following chemotherapy and/or immunotherapy agents: adriamycin, vincristine, cytoxan, rituximab.       To help prevent nausea and vomiting after your treatment, we encourage you to take your nausea medication as directed.  BELOW ARE SYMPTOMS THAT SHOULD BE REPORTED IMMEDIATELY: *FEVER GREATER THAN 100.4 F (38 C) OR HIGHER *CHILLS OR SWEATING *NAUSEA AND VOMITING THAT IS NOT CONTROLLED WITH YOUR NAUSEA MEDICATION *UNUSUAL SHORTNESS OF BREATH *UNUSUAL BRUISING OR BLEEDING *URINARY PROBLEMS (pain or burning when urinating, or frequent urination) *BOWEL PROBLEMS (unusual diarrhea, constipation, pain near the anus) TENDERNESS IN MOUTH AND THROAT WITH OR WITHOUT PRESENCE OF ULCERS (sore throat, sores in mouth, or a toothache) UNUSUAL RASH, SWELLING OR PAIN  UNUSUAL VAGINAL DISCHARGE OR ITCHING   Items with * indicate a potential emergency and should be followed up as soon as possible or go to the Emergency Department if any problems should occur.  Please show the CHEMOTHERAPY ALERT CARD or  IMMUNOTHERAPY ALERT CARD at check-in to the Emergency Department and triage nurse.  Should you have questions after your visit or need to cancel or reschedule your appointment, please contact [facility]  Dept: [phone]  and follow the prompts.  Office hours are 8:00 a.m. to 4:30 p.m. Monday - Friday. Please note that voicemails left after 4:00 p.m. may not be returned until the following business day.  We are closed weekends and major holidays. You have access to a nurse at all times for urgent questions. Please call the main number to the clinic Dept: [phone] and follow the prompts.   For any non-urgent questions, you may also contact your provider using MyChart. We now offer e-Visits for anyone 76 and older to request care online for non-urgent symptoms. For details visit [email or web address].   Also download the MyChart app! Go to the app store, search "MyChart", open the app, select [place], and log in with your MyChart username and password.  Doxorubicin Injection What is this medication? DOXORUBICIN (dox oh ROO bi sin) treats some types of cancer. It works by slowing down the growth of cancer cells. This medicine may be used for other purposes; ask your health care provider or pharmacist if you have questions. COMMON BRAND NAME(S): Adriamycin, Adriamycin PFS, Adriamycin RDF, Rubex What should I tell my care team before I take this medication? They need to know if you have any of these conditions: Heart disease History of low blood cell levels caused by a medication Liver disease Recent or ongoing radiation An unusual or allergic reaction to doxorubicin, other  medications, foods, dyes, or preservatives If you or your partner are pregnant or trying to get pregnant Breast-feeding How should I use this medication? This medication is injected into a vein. It is given by your care team in a hospital or clinic setting. Talk to your care team  about the use of this medication in children. Special care may be needed. Overdosage: If you think you have taken too much of this medicine contact a poison control center or emergency room at once. NOTE: This medicine is only for you. Do not share this medicine with others. What if I miss a dose? Keep appointments for follow-up doses. It is important not to miss your dose. Call your care team if you are unable to keep an appointment. What may interact with this medication? 6-mercaptopurine Paclitaxel Phenytoin St. John's wort Trastuzumab Verapamil This list may not describe all possible interactions. Give your health care provider a list of all the medicines, herbs, non-prescription drugs, or dietary supplements you use. Also tell them if you smoke, drink alcohol, or use illegal drugs. Some items may interact with your medicine. What should I watch for while using this medication? Your condition will be monitored carefully while you are receiving this medication. You may need blood work while taking this medication. This medication may make you feel generally unwell. This is not uncommon as chemotherapy can affect healthy cells as well as cancer cells. Report any side effects. Continue your course of treatment even though you feel ill unless your care team tells you to stop. There is a maximum amount of this medication you should receive throughout your life. The amount depends on the medical condition being treated and your overall health. Your care team will watch how much of this medication you receive. Tell your care team if you have taken this medication before. Your urine may turn red for a few days after your dose. This is not blood. If your urine is dark or brown, call your care team. In some cases, you may be given additional medications to help with side effects. Follow all directions for their use. This medication may increase your risk of getting an infection. Call your care team for  advice if you get a fever, chills, sore throat, or other symptoms of a cold or flu. Do not treat yourself. Try to avoid being around people who are sick. This medication may increase your risk to bruise or bleed. Call your care team if you notice any unusual bleeding. Talk to your care team about your risk of cancer. You may be more at risk for certain types of cancers if you take this medication. Talk to your care team if you or your partner may be pregnant. Serious birth defects can occur if you take this medication during pregnancy and for 6 months after the last dose. Contraception is recommended while taking this medication and for 6 months after the last dose. Your care team can help you find the option that works for you. If your partner can get pregnant, use a condom while taking this medication and for 6 months after the last dose. Do not breastfeed while taking this medication. This medication may cause infertility. Talk to your care team if you are concerned about your fertility. What side effects may I notice from receiving this medication? Side effects that you should report to your care team as soon as possible: Allergic reactions--skin rash, itching, hives, swelling of the face, lips, tongue, or throat Heart failure--shortness of  breath, swelling of the ankles, feet, or hands, sudden weight gain, unusual weakness or fatigue Heart rhythm changes--fast or irregular heartbeat, dizziness, feeling faint or lightheaded, chest pain, trouble breathing Infection--fever, chills, cough, sore throat, wounds that don't heal, pain or trouble when passing urine, general feeling of discomfort or being unwell Low red blood cell level--unusual weakness or fatigue, dizziness, headache, trouble breathing Painful swelling, warmth, or redness of the skin, blisters or sores at the infusion site Unusual bruising or bleeding Side effects that usually do not require medical attention (report to your care team  if they continue or are bothersome): Diarrhea Hair loss Nausea Pain, redness, or swelling with sores inside the mouth or throat Red urine This list may not describe all possible side effects. Call your doctor for medical advice about side effects. You may report side effects to FDA at 1-800-FDA-1088. Where should I keep my medication? This medication is given in a hospital or clinic. It will not be stored at home. NOTE: This sheet is a summary. It may not cover all possible information. If you have questions about this medicine, talk to your doctor, pharmacist, or health care provider.  2024 Elsevier/Gold Standard (2022-10-10 00:00:00) Vincristine Injection What is this medication? VINCRISTINE (vin KRIS teen) treats some types of cancer. It works by slowing down the growth of cancer cells. This medicine may be used for other purposes; ask your health care provider or pharmacist if you have questions. COMMON BRAND NAME(S): Oncovin, Vincasar PFS What should I tell my care team before I take this medication? They need to know if you have any of these conditions: Infection Kidney disease Liver disease Low white blood cell levels Lung disease Nervous system disease, such as Charcot-Marie-Tooth (CMT) Recent or ongoing radiation therapy An unusual or allergic reaction to vincristine, other chemotherapy agents, other medications, foods, dyes, or preservatives Pregnant or trying to get pregnant Breast-feeding How should I use this medication? This medication is infused into a vein. It is given by your care team in a hospital or clinic setting. Talk to your care team about the use of this medication in children. While it may be given to children for selected conditions, precautions do apply. Overdosage: If you think you have taken too much of this medicine contact a poison control center or emergency room at once. NOTE: This medicine is only for you. Do not share this medicine with  others. What if I miss a dose? Keep appointments for follow-up doses. It is important not to miss your dose. Call your care team if you are unable to keep an appointment. What may interact with this medication? Do not take this medication with any of the following: Live virus vaccines This medication may also interact with the following: Medications for fungal infections, such as itraconazole or fluconazole Phenytoin Supplements, such as St. John's wort This list may not describe all possible interactions. Give your health care provider a list of all the medicines, herbs, non-prescription drugs, or dietary supplements you use. Also tell them if you smoke, drink alcohol, or use illegal drugs. Some items may interact with your medicine. What should I watch for while using this medication? Your condition will be monitored carefully while you are receiving this medication. This medication may make you feel generally unwell. This is not uncommon as chemotherapy can affect healthy cells as well as cancer cells. Report any side effects. Continue your course of treatment even though you feel ill unless your care team tells you to stop.  You may need blood work while taking this medication. This medication may increase your risk to bruise or bleed. Call your care team if you notice any unusual bleeding. This medication may increase your risk of getting an infection. Call your care team for advice if you get a fever, chills, sore throat, or other symptoms of a cold or flu. Do not treat yourself. Try to avoid being around people who are sick. This medication will cause constipation. If you do not have a bowel movement for 3 days, call your care team. Call your care team if you are around anyone with measles, chickenpox, or if you develop sores or blisters that do not heal properly. Be careful brushing or flossing your teeth or using a toothpick because you may get an infection or bleed more easily. If you  have any dental work done, tell your dentist you are receiving this medication Talk to your care team if you or your partner wish to become pregnant or think either of you might be pregnant. This medication can cause serious birth defects. This medication may cause infertility. Talk to your care team if you are concerned about your fertility. Talk to your care team before breastfeeding. Changes to your treatment plan may be needed. What side effects may I notice from receiving this medication? Side effects that you should report to your care team as soon as possible: Allergic reactions--skin rash, itching, hives, swelling of the face, lips, tongue, or throat High uric acid level--severe pain, redness, warmth, or swelling in joints, pain or trouble passing urine, pain in the lower back or sides Infection--fever, chills, cough, sore throat, wounds that don't heal, pain or trouble when passing urine, general feeling of discomfort or being unwell Pain, tingling, or numbness in the hands or feet, muscle weakness, change in vision, confusion or trouble speaking, loss of balance or coordination, trouble walking, seizures Painful swelling, warmth, or redness of the skin, blisters or sores at the infusion site Shortness of breath or trouble breathing Side effects that usually do not require medical attention (report to your care team if they continue or are bothersome): Constipation Diarrhea Hair loss Loss of appetite Nausea Stomach cramping Vomiting This list may not describe all possible side effects. Call your doctor for medical advice about side effects. You may report side effects to FDA at 1-800-FDA-1088. Where should I keep my medication? This medication is given in a hospital or clinic. It will not be stored at home. NOTE: This sheet is a summary. It may not cover all possible information. If you have questions about this medicine, talk to your doctor, pharmacist, or health care provider.   2024 Elsevier/Gold Standard (2021-10-02 00:00:00) Cyclophosphamide Injection What is this medication? CYCLOPHOSPHAMIDE (sye kloe FOSS fa mide) treats some types of cancer. It works by slowing down the growth of cancer cells. This medicine may be used for other purposes; ask your health care provider or pharmacist if you have questions. COMMON BRAND NAME(S): Cyclophosphamide, Cytoxan, Neosar What should I tell my care team before I take this medication? They need to know if you have any of these conditions: Heart disease Irregular heartbeat or rhythm Infection Kidney problems Liver disease Low blood cell levels (white cells, platelets, or red blood cells) Lung disease Previous radiation Trouble passing urine An unusual or allergic reaction to cyclophosphamide, other medications, foods, dyes, or preservatives Pregnant or trying to get pregnant Breast-feeding How should I use this medication? This medication is injected into a vein. It is  given by your care team in a hospital or clinic setting. Talk to your care team about the use of this medication in children. Special care may be needed. Overdosage: If you think you have taken too much of this medicine contact a poison control center or emergency room at once. NOTE: This medicine is only for you. Do not share this medicine with others. What if I miss a dose? Keep appointments for follow-up doses. It is important not to miss your dose. Call your care team if you are unable to keep an appointment. What may interact with this medication? Amphotericin B Amiodarone Azathioprine Certain antivirals for HIV or hepatitis Certain medications for blood pressure, such as enalapril, lisinopril, quinapril Cyclosporine Diuretics Etanercept Indomethacin Medications that relax muscles Metronidazole Natalizumab Tamoxifen Warfarin This list may not describe all possible interactions. Give your health care provider a list of all the medicines,  herbs, non-prescription drugs, or dietary supplements you use. Also tell them if you smoke, drink alcohol, or use illegal drugs. Some items may interact with your medicine. What should I watch for while using this medication? This medication may make you feel generally unwell. This is not uncommon as chemotherapy can affect healthy cells as well as cancer cells. Report any side effects. Continue your course of treatment even though you feel ill unless your care team tells you to stop. You may need blood work while you are taking this medication. This medication may increase your risk of getting an infection. Call your care team for advice if you get a fever, chills, sore throat, or other symptoms of a cold or flu. Do not treat yourself. Try to avoid being around people who are sick. Avoid taking medications that contain aspirin, acetaminophen, ibuprofen, naproxen, or ketoprofen unless instructed by your care team. These medications may hide a fever. Be careful brushing or flossing your teeth or using a toothpick because you may get an infection or bleed more easily. If you have any dental work done, tell your dentist you are receiving this medication. Drink water or other fluids as directed. Urinate often, even at night. Some products may contain alcohol. Ask your care team if this medication contains alcohol. Be sure to tell all care teams you are taking this medicine. Certain medicines, like metronidazole and disulfiram, can cause an unpleasant reaction when taken with alcohol. The reaction includes flushing, headache, nausea, vomiting, sweating, and increased thirst. The reaction can last from 30 minutes to several hours. Talk to your care team if you wish to become pregnant or think you might be pregnant. This medication can cause serious birth defects if taken during pregnancy and for 1 year after the last dose. A negative pregnancy test is required before starting this medication. A reliable form of  contraception is recommended while taking this medication and for 1 year after the last dose. Talk to your care team about reliable forms of contraception. Do not father a child while taking this medication and for 4 months after the last dose. Use a condom during this time period. Do not breast-feed while taking this medication or for 1 week after the last dose. This medication may cause infertility. Talk to your care team if you are concerned about your fertility. Talk to your care team about your risk of cancer. You may be more at risk for certain types of cancer if you take this medication. What side effects may I notice from receiving this medication? Side effects that you should report to your  care team as soon as possible: Allergic reactions--skin rash, itching, hives, swelling of the face, lips, tongue, or throat Dry cough, shortness of breath or trouble breathing Heart failure--shortness of breath, swelling of the ankles, feet, or hands, sudden weight gain, unusual weakness or fatigue Heart muscle inflammation--unusual weakness or fatigue, shortness of breath, chest pain, fast or irregular heartbeat, dizziness, swelling of the ankles, feet, or hands Heart rhythm changes--fast or irregular heartbeat, dizziness, feeling faint or lightheaded, chest pain, trouble breathing Infection--fever, chills, cough, sore throat, wounds that don't heal, pain or trouble when passing urine, general feeling of discomfort or being unwell Kidney injury--decrease in the amount of urine, swelling of the ankles, hands, or feet Liver injury--right upper belly pain, loss of appetite, nausea, light-colored stool, dark yellow or brown urine, yellowing skin or eyes, unusual weakness or fatigue Low red blood cell level--unusual weakness or fatigue, dizziness, headache, trouble breathing Low sodium level--muscle weakness, fatigue, dizziness, headache, confusion Red or dark brown urine Unusual bruising or bleeding Side  effects that usually do not require medical attention (report to your care team if they continue or are bothersome): Hair loss Irregular menstrual cycles or spotting Loss of appetite Nausea Pain, redness, or swelling with sores inside the mouth or throat Vomiting This list may not describe all possible side effects. Call your doctor for medical advice about side effects. You may report side effects to FDA at 1-800-FDA-1088. Where should I keep my medication? This medication is given in a hospital or clinic. It will not be stored at home. NOTE: This sheet is a summary. It may not cover all possible information. If you have questions about this medicine, talk to your doctor, pharmacist, or health care provider.  2024 Elsevier/Gold Standard (2021-11-23 00:00:00) Rituximab Injection What is this medication? RITUXIMAB (ri TUX i mab) treats leukemia and lymphoma. It works by blocking a protein that causes cancer cells to grow and multiply. This helps to slow or stop the spread of cancer cells. It may also be used to treat autoimmune conditions, such as arthritis. It works by slowing down an overactive immune system. It is a monoclonal antibody. This medicine may be used for other purposes; ask your health care provider or pharmacist if you have questions. COMMON BRAND NAME(S): RIABNI, Rituxan, RUXIENCE, truxima What should I tell my care team before I take this medication? They need to know if you have any of these conditions: Chest pain Heart disease Immune system problems Infection, such as chickenpox, cold sores, hepatitis B, herpes Irregular heartbeat or rhythm Kidney disease Low blood counts, such as low white cells, platelets, red cells Lung disease Recent or upcoming vaccine An unusual or allergic reaction to rituximab, other medications, foods, dyes, or preservatives Pregnant or trying to get pregnant Breast-feeding How should I use this medication? This medication is injected into  a vein. It is given by a care team in a hospital or clinic setting. A special MedGuide will be given to you before each treatment. Be sure to read this information carefully each time. Talk to your care team about the use of this medication in children. While this medication may be prescribed for children as young as 6 months for selected conditions, precautions do apply. Overdosage: If you think you have taken too much of this medicine contact a poison control center or emergency room at once. NOTE: This medicine is only for you. Do not share this medicine with others. What if I miss a dose? Keep appointments for follow-up doses.  It is important not to miss your dose. Call your care team if you are unable to keep an appointment. What may interact with this medication? Do not take this medication with any of the following: Live vaccines This medication may also interact with the following: Cisplatin This list may not describe all possible interactions. Give your health care provider a list of all the medicines, herbs, non-prescription drugs, or dietary supplements you use. Also tell them if you smoke, drink alcohol, or use illegal drugs. Some items may interact with your medicine. What should I watch for while using this medication? Your condition will be monitored carefully while you are receiving this medication. You may need blood work while taking this medication. This medication can cause serious infusion reactions. To reduce the risk your care team may give you other medications to take before receiving this one. Be sure to follow the directions from your care team. This medication may increase your risk of getting an infection. Call your care team for advice if you get a fever, chills, sore throat, or other symptoms of a cold or flu. Do not treat yourself. Try to avoid being around people who are sick. Call your care team if you are around anyone with measles, chickenpox, or if you develop  sores or blisters that do not heal properly. Avoid taking medications that contain aspirin, acetaminophen, ibuprofen, naproxen, or ketoprofen unless instructed by your care team. These medications may hide a fever. This medication may cause serious skin reactions. They can happen weeks to months after starting the medication. Contact your care team right away if you notice fevers or flu-like symptoms with a rash. The rash may be red or purple and then turn into blisters or peeling of the skin. You may also notice a red rash with swelling of the face, lips, or lymph nodes in your neck or under your arms. In some patients, this medication may cause a serious brain infection that may cause death. If you have any problems seeing, thinking, speaking, walking, or standing, tell your care team right away. If you cannot reach your care team, urgently seek another source of medical care. Talk to your care team if you may be pregnant. Serious birth defects can occur if you take this medication during pregnancy and for 12 months after the last dose. You will need a negative pregnancy test before starting this medication. Contraception is recommended while taking this medication and for 12 months after the last dose. Your care team can help you find the option that works for you. Do not breastfeed while taking this medication and for at least 6 months after the last dose. What side effects may I notice from receiving this medication? Side effects that you should report to your care team as soon as possible: Allergic reactions or angioedema--skin rash, itching or hives, swelling of the face, eyes, lips, tongue, arms, or legs, trouble swallowing or breathing Bowel blockage--stomach cramping, unable to have a bowel movement or pass gas, loss of appetite, vomiting Dizziness, loss of balance or coordination, confusion or trouble speaking Heart attack--pain or tightness in the chest, shoulders, arms, or jaw, nausea,  shortness of breath, cold or clammy skin, feeling faint or lightheaded Heart rhythm changes--fast or irregular heartbeat, dizziness, feeling faint or lightheaded, chest pain, trouble breathing Infection--fever, chills, cough, sore throat, wounds that don't heal, pain or trouble when passing urine, general feeling of discomfort or being unwell Infusion reactions--chest pain, shortness of breath or trouble breathing, feeling faint  or lightheaded Kidney injury--decrease in the amount of urine, swelling of the ankles, hands, or feet Liver injury--right upper belly pain, loss of appetite, nausea, light-colored stool, dark yellow or brown urine, yellowing skin or eyes, unusual weakness or fatigue Redness, blistering, peeling, or loosening of the skin, including inside the mouth Stomach pain that is severe, does not go away, or gets worse Tumor lysis syndrome (TLS)--nausea, vomiting, diarrhea, decrease in the amount of urine, dark urine, unusual weakness or fatigue, confusion, muscle pain or cramps, fast or irregular heartbeat, joint pain Side effects that usually do not require medical attention (report to your care team if they continue or are bothersome): Headache Joint pain Nausea Runny or stuffy nose Unusual weakness or fatigue This list may not describe all possible side effects. Call your doctor for medical advice about side effects. You may report side effects to FDA at 1-800-FDA-1088. Where should I keep my medication? This medication is given in a hospital or clinic. It will not be stored at home. NOTE: This sheet is a summary. It may not cover all possible information. If you have questions about this medicine, talk to your doctor, pharmacist, or health care provider.  2024 [employer]/Gold Standard (2021-11-29 00:00:00)

## 2023-03-17 ENCOUNTER — Telehealth: Payer: Self-pay

## 2023-03-17 ENCOUNTER — Inpatient Hospital Stay: Payer: Medicare Other

## 2023-03-17 NOTE — Telephone Encounter (Signed)
Called and spoke to patient for first time chemo follow-up.  Patient stated she tolerated treatment fine but was very tired today. Patient denied nausea/vomiting, and stated she has been able to drink fluids and eat her meals as usual.   Patient informed that the tiredness was normal and should start to improve with each day.  Encouraged patient to continue to drink fluids and eat her meals. Reminded patient of appointment on Friday 03/21/23.  Patient verbalized understanding to all and agreed to contact office if she had any future questions or concerns.

## 2023-03-21 ENCOUNTER — Telehealth: Payer: Self-pay | Admitting: *Deleted

## 2023-03-21 ENCOUNTER — Inpatient Hospital Stay: Payer: Medicare Other

## 2023-03-21 ENCOUNTER — Other Ambulatory Visit: Payer: Medicare Other

## 2023-03-21 DIAGNOSIS — R918 Other nonspecific abnormal finding of lung field: Secondary | ICD-10-CM | POA: Diagnosis not present

## 2023-03-21 DIAGNOSIS — H409 Unspecified glaucoma: Secondary | ICD-10-CM | POA: Diagnosis not present

## 2023-03-21 DIAGNOSIS — C8518 Unspecified B-cell lymphoma, lymph nodes of multiple sites: Secondary | ICD-10-CM | POA: Diagnosis not present

## 2023-03-21 DIAGNOSIS — C8580 Other specified types of non-Hodgkin lymphoma, unspecified site: Secondary | ICD-10-CM

## 2023-03-21 DIAGNOSIS — Z85048 Personal history of other malignant neoplasm of rectum, rectosigmoid junction, and anus: Secondary | ICD-10-CM | POA: Diagnosis not present

## 2023-03-21 DIAGNOSIS — D509 Iron deficiency anemia, unspecified: Secondary | ICD-10-CM | POA: Diagnosis not present

## 2023-03-21 DIAGNOSIS — Z79899 Other long term (current) drug therapy: Secondary | ICD-10-CM | POA: Diagnosis not present

## 2023-03-21 DIAGNOSIS — C8589 Other specified types of non-Hodgkin lymphoma, extranodal and solid organ sites: Secondary | ICD-10-CM | POA: Diagnosis not present

## 2023-03-21 DIAGNOSIS — I10 Essential (primary) hypertension: Secondary | ICD-10-CM | POA: Diagnosis not present

## 2023-03-21 LAB — URIC ACID: Uric Acid, Serum: 4.9 mg/dL (ref 2.5–7.1)

## 2023-03-21 LAB — CBC WITH DIFFERENTIAL (CANCER CENTER ONLY)
Abs Immature Granulocytes: 0.09 10*3/uL — ABNORMAL HIGH (ref 0.00–0.07)
Basophils Absolute: 0 10*3/uL (ref 0.0–0.1)
Basophils Relative: 1 %
Eosinophils Absolute: 0.2 10*3/uL (ref 0.0–0.5)
Eosinophils Relative: 2 %
HCT: 38.9 % (ref 36.0–46.0)
Hemoglobin: 13.2 g/dL (ref 12.0–15.0)
Immature Granulocytes: 1 %
Lymphocytes Relative: 10 %
Lymphs Abs: 0.8 10*3/uL (ref 0.7–4.0)
MCH: 28.5 pg (ref 26.0–34.0)
MCHC: 33.9 g/dL (ref 30.0–36.0)
MCV: 84 fL (ref 80.0–100.0)
Monocytes Absolute: 0.1 10*3/uL (ref 0.1–1.0)
Monocytes Relative: 1 %
Neutro Abs: 6.3 10*3/uL (ref 1.7–7.7)
Neutrophils Relative %: 85 %
Platelet Count: 273 10*3/uL (ref 150–400)
RBC: 4.63 MIL/uL (ref 3.87–5.11)
RDW: 13.1 % (ref 11.5–15.5)
WBC Count: 7.5 10*3/uL (ref 4.0–10.5)
nRBC: 0 % (ref 0.0–0.2)

## 2023-03-21 LAB — CMP (CANCER CENTER ONLY)
ALT: 31 U/L (ref 0–44)
AST: 21 U/L (ref 15–41)
Albumin: 4 g/dL (ref 3.5–5.0)
Alkaline Phosphatase: 70 U/L (ref 38–126)
Anion gap: 8 (ref 5–15)
BUN: 22 mg/dL (ref 8–23)
CO2: 28 mmol/L (ref 22–32)
Calcium: 9.3 mg/dL (ref 8.9–10.3)
Chloride: 100 mmol/L (ref 98–111)
Creatinine: 1.07 mg/dL — ABNORMAL HIGH (ref 0.44–1.00)
GFR, Estimated: 48 mL/min — ABNORMAL LOW (ref >60)
Glucose, Bld: 87 mg/dL (ref 70–99)
Potassium: 4.2 mmol/L (ref 3.5–5.1)
Sodium: 136 mmol/L (ref 135–145)
Total Bilirubin: 0.7 mg/dL (ref 0.3–1.2)
Total Protein: 6.2 g/dL — ABNORMAL LOW (ref 6.5–8.1)

## 2023-03-21 NOTE — Telephone Encounter (Signed)
-----   Message from Thornton Papas sent at 03/21/2023  1:02 PM EDT ----- Please call patient, the labs from today look good, follow-up as scheduled

## 2023-03-21 NOTE — Patient Instructions (Signed)

## 2023-03-21 NOTE — Telephone Encounter (Signed)
Notified patient that labs today look good. F/U as scheduled. Her only complaint is that she is still fatigued.

## 2023-03-26 ENCOUNTER — Other Ambulatory Visit: Payer: Self-pay

## 2023-04-05 ENCOUNTER — Other Ambulatory Visit: Payer: Self-pay | Admitting: Oncology

## 2023-04-05 DIAGNOSIS — C8580 Other specified types of non-Hodgkin lymphoma, unspecified site: Secondary | ICD-10-CM

## 2023-04-07 ENCOUNTER — Inpatient Hospital Stay: Payer: Medicare Other | Attending: Oncology

## 2023-04-07 ENCOUNTER — Inpatient Hospital Stay: Payer: Medicare Other

## 2023-04-07 ENCOUNTER — Encounter: Payer: Self-pay | Admitting: Nurse Practitioner

## 2023-04-07 ENCOUNTER — Inpatient Hospital Stay: Payer: Medicare Other | Admitting: Nurse Practitioner

## 2023-04-07 VITALS — BP 160/60 | HR 62 | Temp 98.1°F | Resp 18 | Ht 62.0 in | Wt 144.7 lb

## 2023-04-07 VITALS — BP 146/50 | HR 72 | Temp 98.1°F | Resp 20

## 2023-04-07 DIAGNOSIS — D509 Iron deficiency anemia, unspecified: Secondary | ICD-10-CM | POA: Diagnosis not present

## 2023-04-07 DIAGNOSIS — C8580 Other specified types of non-Hodgkin lymphoma, unspecified site: Secondary | ICD-10-CM

## 2023-04-07 DIAGNOSIS — Z85048 Personal history of other malignant neoplasm of rectum, rectosigmoid junction, and anus: Secondary | ICD-10-CM | POA: Insufficient documentation

## 2023-04-07 DIAGNOSIS — C189 Malignant neoplasm of colon, unspecified: Secondary | ICD-10-CM | POA: Diagnosis not present

## 2023-04-07 DIAGNOSIS — H409 Unspecified glaucoma: Secondary | ICD-10-CM | POA: Diagnosis not present

## 2023-04-07 DIAGNOSIS — K449 Diaphragmatic hernia without obstruction or gangrene: Secondary | ICD-10-CM | POA: Diagnosis not present

## 2023-04-07 DIAGNOSIS — Z79899 Other long term (current) drug therapy: Secondary | ICD-10-CM | POA: Insufficient documentation

## 2023-04-07 DIAGNOSIS — R918 Other nonspecific abnormal finding of lung field: Secondary | ICD-10-CM | POA: Diagnosis not present

## 2023-04-07 DIAGNOSIS — Z5111 Encounter for antineoplastic chemotherapy: Secondary | ICD-10-CM | POA: Diagnosis not present

## 2023-04-07 DIAGNOSIS — I10 Essential (primary) hypertension: Secondary | ICD-10-CM | POA: Insufficient documentation

## 2023-04-07 LAB — CMP (CANCER CENTER ONLY)
ALT: 10 U/L (ref 0–44)
AST: 21 U/L (ref 15–41)
Albumin: 4 g/dL (ref 3.5–5.0)
Alkaline Phosphatase: 82 U/L (ref 38–126)
Anion gap: 10 (ref 5–15)
BUN: 18 mg/dL (ref 8–23)
CO2: 24 mmol/L (ref 22–32)
Calcium: 8.5 mg/dL — ABNORMAL LOW (ref 8.9–10.3)
Chloride: 104 mmol/L (ref 98–111)
Creatinine: 1.16 mg/dL — ABNORMAL HIGH (ref 0.44–1.00)
GFR, Estimated: 43 mL/min — ABNORMAL LOW (ref >60)
Glucose, Bld: 91 mg/dL (ref 70–99)
Potassium: 4.3 mmol/L (ref 3.5–5.1)
Sodium: 138 mmol/L (ref 135–145)
Total Bilirubin: 0.4 mg/dL (ref 0.3–1.2)
Total Protein: 6.6 g/dL (ref 6.5–8.1)

## 2023-04-07 LAB — CBC WITH DIFFERENTIAL (CANCER CENTER ONLY)
Abs Immature Granulocytes: 0.06 10*3/uL (ref 0.00–0.07)
Basophils Absolute: 0.1 10*3/uL (ref 0.0–0.1)
Basophils Relative: 1 %
Eosinophils Absolute: 0.1 10*3/uL (ref 0.0–0.5)
Eosinophils Relative: 1 %
HCT: 37.9 % (ref 36.0–46.0)
Hemoglobin: 12.7 g/dL (ref 12.0–15.0)
Immature Granulocytes: 1 %
Lymphocytes Relative: 11 %
Lymphs Abs: 0.8 10*3/uL (ref 0.7–4.0)
MCH: 28.5 pg (ref 26.0–34.0)
MCHC: 33.5 g/dL (ref 30.0–36.0)
MCV: 85 fL (ref 80.0–100.0)
Monocytes Absolute: 1.2 10*3/uL — ABNORMAL HIGH (ref 0.1–1.0)
Monocytes Relative: 15 %
Neutro Abs: 5.5 10*3/uL (ref 1.7–7.7)
Neutrophils Relative %: 71 %
Platelet Count: 253 10*3/uL (ref 150–400)
RBC: 4.46 MIL/uL (ref 3.87–5.11)
RDW: 14.1 % (ref 11.5–15.5)
WBC Count: 7.7 10*3/uL (ref 4.0–10.5)
nRBC: 0 % (ref 0.0–0.2)

## 2023-04-07 MED ORDER — SODIUM CHLORIDE 0.9 % IV SOLN
375.0000 mg/m2 | Freq: Once | INTRAVENOUS | Status: AC
  Administered 2023-04-07: 600 mg via INTRAVENOUS
  Filled 2023-04-07: qty 50

## 2023-04-07 MED ORDER — SODIUM CHLORIDE 0.9 % IV SOLN
10.0000 mg | Freq: Once | INTRAVENOUS | Status: AC
  Administered 2023-04-07: 10 mg via INTRAVENOUS
  Filled 2023-04-07: qty 10

## 2023-04-07 MED ORDER — SODIUM CHLORIDE 0.9 % IV SOLN: Freq: Once | INTRAVENOUS | Status: AC

## 2023-04-07 MED ORDER — SODIUM CHLORIDE 0.9% FLUSH
10.0000 mL | INTRAVENOUS | Status: DC | PRN
  Administered 2023-04-07: 10 mL

## 2023-04-07 MED ORDER — PALONOSETRON HCL INJECTION 0.25 MG/5ML
0.2500 mg | Freq: Once | INTRAVENOUS | Status: AC
  Administered 2023-04-07: 0.25 mg via INTRAVENOUS
  Filled 2023-04-07: qty 5

## 2023-04-07 MED ORDER — SODIUM CHLORIDE 0.9 % IV SOLN
300.0000 mg/m2 | Freq: Once | INTRAVENOUS | Status: AC
  Administered 2023-04-07: 500 mg via INTRAVENOUS
  Filled 2023-04-07: qty 25

## 2023-04-07 MED ORDER — SODIUM CHLORIDE 0.9 % IV SOLN
150.0000 mg | Freq: Once | INTRAVENOUS | Status: AC
  Administered 2023-04-07: 150 mg via INTRAVENOUS
  Filled 2023-04-07: qty 150

## 2023-04-07 MED ORDER — DOXORUBICIN HCL CHEMO IV INJECTION 2 MG/ML
25.0000 mg/m2 | Freq: Once | INTRAVENOUS | Status: AC
  Administered 2023-04-07: 42 mg via INTRAVENOUS
  Filled 2023-04-07: qty 21

## 2023-04-07 MED ORDER — DIPHENHYDRAMINE HCL 25 MG PO CAPS
25.0000 mg | ORAL_CAPSULE | Freq: Once | ORAL | Status: AC
  Administered 2023-04-07: 25 mg via ORAL
  Filled 2023-04-07: qty 1

## 2023-04-07 MED ORDER — SODIUM CHLORIDE 0.9 % IV SOLN
375.0000 mg/m2 | Freq: Once | INTRAVENOUS | Status: DC
  Filled 2023-04-07: qty 60

## 2023-04-07 MED ORDER — ACETAMINOPHEN 325 MG PO TABS
650.0000 mg | ORAL_TABLET | Freq: Once | ORAL | Status: AC
  Administered 2023-04-07: 650 mg via ORAL
  Filled 2023-04-07: qty 2

## 2023-04-07 MED ORDER — VINCRISTINE SULFATE CHEMO INJECTION 1 MG/ML
1.0000 mg | Freq: Once | INTRAVENOUS | Status: AC
  Administered 2023-04-07: 1 mg via INTRAVENOUS
  Filled 2023-04-07: qty 1

## 2023-04-07 MED ORDER — HEPARIN SOD (PORK) LOCK FLUSH 100 UNIT/ML IV SOLN
500.0000 [IU] | Freq: Once | INTRAVENOUS | Status: AC | PRN
  Administered 2023-04-07: 500 [IU]

## 2023-04-07 NOTE — Patient Instructions (Signed)

## 2023-04-07 NOTE — Progress Notes (Signed)
[facility] OFFICE PROGRESS NOTE   Diagnosis: Non-Hodgkin's lymphoma  INTERVAL HISTORY:   Amber Park returns as scheduled.  She completed cycle 1R-mini CHOP 03/14/2023.  She denies nausea/vomiting.  No mouth sores.  No change in baseline loose stools.  No significant neuropathy symptoms.  Stable dyspnea on exertion.  Occasional cough.  No fever.  Good appetite.  Main complaint was a decreased energy level for about a week following treatment.  Objective:  Vital signs in last 24 hours:  Blood pressure (!) 160/60, pulse 62, temperature 98.1 F (36.7 C), temperature source Oral, resp. rate 18, height 5\' 2"  (1.575 m), weight 144 lb 11.2 oz (65.6 kg), SpO2 98%.    HEENT: No thrush or ulcers. Lymphatics: Left axillary/chest wall mass no longer palpable.  There is tenderness in this area. Resp: Lungs clear bilaterally. Cardio: Regular rate and rhythm. GI: No hepatosplenomegaly. Vascular: No leg edema. Port-A-Cath without erythema.  Lab Results:  Lab Results  Component Value Date   WBC 7.7 04/07/2023   HGB 12.7 04/07/2023   HCT 37.9 04/07/2023   MCV 85.0 04/07/2023   PLT 253 04/07/2023   NEUTROABS 5.5 04/07/2023    Imaging:  No results found.  Medications: I have reviewed the patient's current medications.  Assessment/Plan: Colon cancer-stage IIIb Cecal mass on colonoscopy 01/03/2020, biopsy confirmed adenocarcinoma CTs 01/11/2020-cecal mass, prominent subcentimeter mesenteric nodes adjacent to the cecum, multiple tiny pulmonary nodules-metastases not favored Laparoscopic right colectomy 02/10/2020,pT3pN1a, grade 2-grade 3, 1/27 lymph nodes, 1 tumor deposit, MSI-high, loss of MLH1 and PMS 2 expression; MLH1 methylation detected CTs 02/05/2021-no evidence of recurrent disease, stable tiny upper lobe pulmonary nodules considered benign CTs 02/12/2022-nonspecific lymphadenopathy in the mesentery with mild mesenteric stranding, largest nodes measure up to 8 mm with no  evidence of metastatic disease in the chest CTs 01/13/2023-new pleural-based masses in the lateral left upper chest with destruction of the left anterior second rib, no other evidence of metastatic disease CT-guided biopsy of left chest wall mass 02/21/2023-B-cell lymphoma with a high Ki-67, Bcl-2 positive, negative for double hit or triple hit lymphoma, large B-cell lymphoma per communication with the pathologist PET 03/11/2023-intense hypermetabolic activity associated with left chest wall mass, no evidence of additional sites of lymphoma, no evidence of recurrent colon cancer Cycle 1R-mini CHOP 03/14/2023 Cycle 2R-mini CHOP 04/07/2023 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 and history of AVMs/Cameron's erosions Ferrous gluconate 07/24/2020-allergic/infusion reaction with chest and back pain, diarrhea, nausea/vomiting, chills, presyncope, hypotension Feraheme 08/14/2020 and 08/21/2020-tolerated well Hiatal hernia Family history of colon and bladder cancer Transverse colon tubular adenoma on the colonoscopy 01/03/2020 Hypertension Glaucoma Admission 02/12/2022 with choledocholithiasis, status post ERCP and stone removal Left chest wall masses on CT 01/13/2023-CT-guided biopsy of left chest wall mass 02/21/2023 Hypercalcemia 03/07/2023-Zometa and IV fluids Echocardiogram 03/10/2023-LVEF 65%, normal left and right ventricular function      Disposition: Amber Park appears stable.  She tolerated cycle 1R-mini CHOP well.  The left axillary mass has resolved.  Plan to proceed with cycle 2 today as scheduled.  CBC and chemistry panel reviewed.  Labs adequate to proceed as above.  She will return for follow-up and treatment in 3 weeks.  We are available to see her sooner if needed.  Patient seen with Dr. Truett Perna.    Amber Park ANP/GNP-BC   04/07/2023  9:53 AM  This was a shared visit with Amber Park.  Amber Park was interviewed and examined.  She tolerated the first cycle of R-mini CHOP well.  The left  chest wall mass appears significantly smaller and her pain has improved.  She will complete cycle 2 R-mini CHOP today.  Mancel Bale, MD

## 2023-04-07 NOTE — Progress Notes (Signed)
Patient seen by Lisa Thomas NP today  Vitals are within treatment parameters.  Labs reviewed by Lisa Thomas NP and are within treatment parameters.  Per physician team, patient is ready for treatment and there are NO modifications to the treatment plan.     

## 2023-04-07 NOTE — Patient Instructions (Signed)
[place] [facility]  Discharge Instructions: Thank you for choosing [facility] to provide your oncology and hematology care.   If you have a lab appointment with the Cancer Center, please go directly to the Cancer Center and check in at the registration area.   Wear comfortable clothing and clothing appropriate for easy access to any Portacath or PICC line.   We strive to give you quality time with your provider. You may need to reschedule your appointment if you arrive late (15 or more minutes).  Arriving late affects you and other patients whose appointments are after yours.  Also, if you miss three or more appointments without notifying the office, you may be dismissed from the clinic at the provider's discretion.      For prescription refill requests, have your pharmacy contact our office and allow 72 hours for refills to be completed.    Today you received the following chemotherapy and/or immunotherapy agents: adriamycin, vincristine, cytoxan, rituximab.       To help prevent nausea and vomiting after your treatment, we encourage you to take your nausea medication as directed.  BELOW ARE SYMPTOMS THAT SHOULD BE REPORTED IMMEDIATELY: *FEVER GREATER THAN 100.4 F (38 C) OR HIGHER *CHILLS OR SWEATING *NAUSEA AND VOMITING THAT IS NOT CONTROLLED WITH YOUR NAUSEA MEDICATION *UNUSUAL SHORTNESS OF BREATH *UNUSUAL BRUISING OR BLEEDING *URINARY PROBLEMS (pain or burning when urinating, or frequent urination) *BOWEL PROBLEMS (unusual diarrhea, constipation, pain near the anus) TENDERNESS IN MOUTH AND THROAT WITH OR WITHOUT PRESENCE OF ULCERS (sore throat, sores in mouth, or a toothache) UNUSUAL RASH, SWELLING OR PAIN  UNUSUAL VAGINAL DISCHARGE OR ITCHING   Items with * indicate a potential emergency and should be followed up as soon as possible or go to the Emergency Department if any problems should occur.  Please show the CHEMOTHERAPY ALERT CARD or  IMMUNOTHERAPY ALERT CARD at check-in to the Emergency Department and triage nurse.  Should you have questions after your visit or need to cancel or reschedule your appointment, please contact [place] [facility]  Dept: [phone]  and follow the prompts.  Office hours are 8:00 a.m. to 4:30 p.m. Monday - Friday. Please note that voicemails left after 4:00 p.m. may not be returned until the following business day.  We are closed weekends and major holidays. You have access to a nurse at all times for urgent questions. Please call the main number to the clinic Dept: [phone] and follow the prompts.   For any non-urgent questions, you may also contact your provider using MyChart. We now offer e-Visits for anyone 47 and older to request care online for non-urgent symptoms. For details visit [email or web address].   Also download the MyChart app! Go to the app store, search "MyChart", open the app, select [facility], and log in with your MyChart username and password.  Doxorubicin Injection What is this medication? DOXORUBICIN (dox oh ROO bi sin) treats some types of cancer. It works by slowing down the growth of cancer cells. This medicine may be used for other purposes; ask your health care provider or pharmacist if you have questions. COMMON BRAND NAME(S): Adriamycin, Adriamycin PFS, Adriamycin RDF, Rubex What should I tell my care team before I take this medication? They need to know if you have any of these conditions: Heart disease History of low blood cell levels caused by a medication Liver disease Recent or ongoing radiation An unusual or allergic reaction to doxorubicin, other  medications, foods, dyes, or preservatives If you or your partner are pregnant or trying to get pregnant Breast-feeding How should I use this medication? This medication is injected into a vein. It is given by your care team in a hospital or clinic setting. Talk to your care team  about the use of this medication in children. Special care may be needed. Overdosage: If you think you have taken too much of this medicine contact a poison control center or emergency room at once. NOTE: This medicine is only for you. Do not share this medicine with others. What if I miss a dose? Keep appointments for follow-up doses. It is important not to miss your dose. Call your care team if you are unable to keep an appointment. What may interact with this medication? 6-mercaptopurine Paclitaxel Phenytoin St. John's wort Trastuzumab Verapamil This list may not describe all possible interactions. Give your health care provider a list of all the medicines, herbs, non-prescription drugs, or dietary supplements you use. Also tell them if you smoke, drink alcohol, or use illegal drugs. Some items may interact with your medicine. What should I watch for while using this medication? Your condition will be monitored carefully while you are receiving this medication. You may need blood work while taking this medication. This medication may make you feel generally unwell. This is not uncommon as chemotherapy can affect healthy cells as well as cancer cells. Report any side effects. Continue your course of treatment even though you feel ill unless your care team tells you to stop. There is a maximum amount of this medication you should receive throughout your life. The amount depends on the medical condition being treated and your overall health. Your care team will watch how much of this medication you receive. Tell your care team if you have taken this medication before. Your urine may turn red for a few days after your dose. This is not blood. If your urine is dark or brown, call your care team. In some cases, you may be given additional medications to help with side effects. Follow all directions for their use. This medication may increase your risk of getting an infection. Call your care team for  advice if you get a fever, chills, sore throat, or other symptoms of a cold or flu. Do not treat yourself. Try to avoid being around people who are sick. This medication may increase your risk to bruise or bleed. Call your care team if you notice any unusual bleeding. Talk to your care team about your risk of cancer. You may be more at risk for certain types of cancers if you take this medication. Talk to your care team if you or your partner may be pregnant. Serious birth defects can occur if you take this medication during pregnancy and for 6 months after the last dose. Contraception is recommended while taking this medication and for 6 months after the last dose. Your care team can help you find the option that works for you. If your partner can get pregnant, use a condom while taking this medication and for 6 months after the last dose. Do not breastfeed while taking this medication. This medication may cause infertility. Talk to your care team if you are concerned about your fertility. What side effects may I notice from receiving this medication? Side effects that you should report to your care team as soon as possible: Allergic reactions--skin rash, itching, hives, swelling of the face, lips, tongue, or throat Heart failure--shortness of  breath, swelling of the ankles, feet, or hands, sudden weight gain, unusual weakness or fatigue Heart rhythm changes--fast or irregular heartbeat, dizziness, feeling faint or lightheaded, chest pain, trouble breathing Infection--fever, chills, cough, sore throat, wounds that don't heal, pain or trouble when passing urine, general feeling of discomfort or being unwell Low red blood cell level--unusual weakness or fatigue, dizziness, headache, trouble breathing Painful swelling, warmth, or redness of the skin, blisters or sores at the infusion site Unusual bruising or bleeding Side effects that usually do not require medical attention (report to your care team  if they continue or are bothersome): Diarrhea Hair loss Nausea Pain, redness, or swelling with sores inside the mouth or throat Red urine This list may not describe all possible side effects. Call your doctor for medical advice about side effects. You may report side effects to FDA at 1-800-FDA-1088. Where should I keep my medication? This medication is given in a hospital or clinic. It will not be stored at home. NOTE: This sheet is a summary. It may not cover all possible information. If you have questions about this medicine, talk to your doctor, pharmacist, or health care provider.  2024 Elsevier/Gold Standard (2022-10-10 00:00:00) Vincristine Injection What is this medication? VINCRISTINE (vin KRIS teen) treats some types of cancer. It works by slowing down the growth of cancer cells. This medicine may be used for other purposes; ask your health care provider or pharmacist if you have questions. COMMON BRAND NAME(S): Oncovin, Vincasar PFS What should I tell my care team before I take this medication? They need to know if you have any of these conditions: Infection Kidney disease Liver disease Low white blood cell levels Lung disease Nervous system disease, such as Charcot-Marie-Tooth (CMT) Recent or ongoing radiation therapy An unusual or allergic reaction to vincristine, other chemotherapy agents, other medications, foods, dyes, or preservatives Pregnant or trying to get pregnant Breast-feeding How should I use this medication? This medication is infused into a vein. It is given by your care team in a hospital or clinic setting. Talk to your care team about the use of this medication in children. While it may be given to children for selected conditions, precautions do apply. Overdosage: If you think you have taken too much of this medicine contact a poison control center or emergency room at once. NOTE: This medicine is only for you. Do not share this medicine with  others. What if I miss a dose? Keep appointments for follow-up doses. It is important not to miss your dose. Call your care team if you are unable to keep an appointment. What may interact with this medication? Do not take this medication with any of the following: Live virus vaccines This medication may also interact with the following: Medications for fungal infections, such as itraconazole or fluconazole Phenytoin Supplements, such as St. John's wort This list may not describe all possible interactions. Give your health care provider a list of all the medicines, herbs, non-prescription drugs, or dietary supplements you use. Also tell them if you smoke, drink alcohol, or use illegal drugs. Some items may interact with your medicine. What should I watch for while using this medication? Your condition will be monitored carefully while you are receiving this medication. This medication may make you feel generally unwell. This is not uncommon as chemotherapy can affect healthy cells as well as cancer cells. Report any side effects. Continue your course of treatment even though you feel ill unless your care team tells you to stop.  You may need blood work while taking this medication. This medication may increase your risk to bruise or bleed. Call your care team if you notice any unusual bleeding. This medication may increase your risk of getting an infection. Call your care team for advice if you get a fever, chills, sore throat, or other symptoms of a cold or flu. Do not treat yourself. Try to avoid being around people who are sick. This medication will cause constipation. If you do not have a bowel movement for 3 days, call your care team. Call your care team if you are around anyone with measles, chickenpox, or if you develop sores or blisters that do not heal properly. Be careful brushing or flossing your teeth or using a toothpick because you may get an infection or bleed more easily. If you  have any dental work done, tell your dentist you are receiving this medication Talk to your care team if you or your partner wish to become pregnant or think either of you might be pregnant. This medication can cause serious birth defects. This medication may cause infertility. Talk to your care team if you are concerned about your fertility. Talk to your care team before breastfeeding. Changes to your treatment plan may be needed. What side effects may I notice from receiving this medication? Side effects that you should report to your care team as soon as possible: Allergic reactions--skin rash, itching, hives, swelling of the face, lips, tongue, or throat High uric acid level--severe pain, redness, warmth, or swelling in joints, pain or trouble passing urine, pain in the lower back or sides Infection--fever, chills, cough, sore throat, wounds that don't heal, pain or trouble when passing urine, general feeling of discomfort or being unwell Pain, tingling, or numbness in the hands or feet, muscle weakness, change in vision, confusion or trouble speaking, loss of balance or coordination, trouble walking, seizures Painful swelling, warmth, or redness of the skin, blisters or sores at the infusion site Shortness of breath or trouble breathing Side effects that usually do not require medical attention (report to your care team if they continue or are bothersome): Constipation Diarrhea Hair loss Loss of appetite Nausea Stomach cramping Vomiting This list may not describe all possible side effects. Call your doctor for medical advice about side effects. You may report side effects to FDA at 1-800-FDA-1088. Where should I keep my medication? This medication is given in a hospital or clinic. It will not be stored at home. NOTE: This sheet is a summary. It may not cover all possible information. If you have questions about this medicine, talk to your doctor, pharmacist, or health care provider.   2024 Elsevier/Gold Standard (2021-10-02 00:00:00) Cyclophosphamide Injection What is this medication? CYCLOPHOSPHAMIDE (sye kloe FOSS fa mide) treats some types of cancer. It works by slowing down the growth of cancer cells. This medicine may be used for other purposes; ask your health care provider or pharmacist if you have questions. COMMON BRAND NAME(S): Cyclophosphamide, Cytoxan, Neosar What should I tell my care team before I take this medication? They need to know if you have any of these conditions: Heart disease Irregular heartbeat or rhythm Infection Kidney problems Liver disease Low blood cell levels (white cells, platelets, or red blood cells) Lung disease Previous radiation Trouble passing urine An unusual or allergic reaction to cyclophosphamide, other medications, foods, dyes, or preservatives Pregnant or trying to get pregnant Breast-feeding How should I use this medication? This medication is injected into a vein. It is  given by your care team in a hospital or clinic setting. Talk to your care team about the use of this medication in children. Special care may be needed. Overdosage: If you think you have taken too much of this medicine contact a poison control center or emergency room at once. NOTE: This medicine is only for you. Do not share this medicine with others. What if I miss a dose? Keep appointments for follow-up doses. It is important not to miss your dose. Call your care team if you are unable to keep an appointment. What may interact with this medication? Amphotericin B Amiodarone Azathioprine Certain antivirals for HIV or hepatitis Certain medications for blood pressure, such as enalapril, lisinopril, quinapril Cyclosporine Diuretics Etanercept Indomethacin Medications that relax muscles Metronidazole Natalizumab Tamoxifen Warfarin This list may not describe all possible interactions. Give your health care provider a list of all the medicines,  herbs, non-prescription drugs, or dietary supplements you use. Also tell them if you smoke, drink alcohol, or use illegal drugs. Some items may interact with your medicine. What should I watch for while using this medication? This medication may make you feel generally unwell. This is not uncommon as chemotherapy can affect healthy cells as well as cancer cells. Report any side effects. Continue your course of treatment even though you feel ill unless your care team tells you to stop. You may need blood work while you are taking this medication. This medication may increase your risk of getting an infection. Call your care team for advice if you get a fever, chills, sore throat, or other symptoms of a cold or flu. Do not treat yourself. Try to avoid being around people who are sick. Avoid taking medications that contain aspirin, acetaminophen, ibuprofen, naproxen, or ketoprofen unless instructed by your care team. These medications may hide a fever. Be careful brushing or flossing your teeth or using a toothpick because you may get an infection or bleed more easily. If you have any dental work done, tell your dentist you are receiving this medication. Drink water or other fluids as directed. Urinate often, even at night. Some products may contain alcohol. Ask your care team if this medication contains alcohol. Be sure to tell all care teams you are taking this medicine. Certain medicines, like metronidazole and disulfiram, can cause an unpleasant reaction when taken with alcohol. The reaction includes flushing, headache, nausea, vomiting, sweating, and increased thirst. The reaction can last from 30 minutes to several hours. Talk to your care team if you wish to become pregnant or think you might be pregnant. This medication can cause serious birth defects if taken during pregnancy and for 1 year after the last dose. A negative pregnancy test is required before starting this medication. A reliable form of  contraception is recommended while taking this medication and for 1 year after the last dose. Talk to your care team about reliable forms of contraception. Do not father a child while taking this medication and for 4 months after the last dose. Use a condom during this time period. Do not breast-feed while taking this medication or for 1 week after the last dose. This medication may cause infertility. Talk to your care team if you are concerned about your fertility. Talk to your care team about your risk of cancer. You may be more at risk for certain types of cancer if you take this medication. What side effects may I notice from receiving this medication? Side effects that you should report to your  care team as soon as possible: Allergic reactions--skin rash, itching, hives, swelling of the face, lips, tongue, or throat Dry cough, shortness of breath or trouble breathing Heart failure--shortness of breath, swelling of the ankles, feet, or hands, sudden weight gain, unusual weakness or fatigue Heart muscle inflammation--unusual weakness or fatigue, shortness of breath, chest pain, fast or irregular heartbeat, dizziness, swelling of the ankles, feet, or hands Heart rhythm changes--fast or irregular heartbeat, dizziness, feeling faint or lightheaded, chest pain, trouble breathing Infection--fever, chills, cough, sore throat, wounds that don't heal, pain or trouble when passing urine, general feeling of discomfort or being unwell Kidney injury--decrease in the amount of urine, swelling of the ankles, hands, or feet Liver injury--right upper belly pain, loss of appetite, nausea, light-colored stool, dark yellow or brown urine, yellowing skin or eyes, unusual weakness or fatigue Low red blood cell level--unusual weakness or fatigue, dizziness, headache, trouble breathing Low sodium level--muscle weakness, fatigue, dizziness, headache, confusion Red or dark brown urine Unusual bruising or bleeding Side  effects that usually do not require medical attention (report to your care team if they continue or are bothersome): Hair loss Irregular menstrual cycles or spotting Loss of appetite Nausea Pain, redness, or swelling with sores inside the mouth or throat Vomiting This list may not describe all possible side effects. Call your doctor for medical advice about side effects. You may report side effects to FDA at 1-800-FDA-1088. Where should I keep my medication? This medication is given in a hospital or clinic. It will not be stored at home. NOTE: This sheet is a summary. It may not cover all possible information. If you have questions about this medicine, talk to your doctor, pharmacist, or health care provider.  2024 Elsevier/Gold Standard (2021-11-23 00:00:00) Rituximab Injection What is this medication? RITUXIMAB (ri TUX i mab) treats leukemia and lymphoma. It works by blocking a protein that causes cancer cells to grow and multiply. This helps to slow or stop the spread of cancer cells. It may also be used to treat autoimmune conditions, such as arthritis. It works by slowing down an overactive immune system. It is a monoclonal antibody. This medicine may be used for other purposes; ask your health care provider or pharmacist if you have questions. COMMON BRAND NAME(S): RIABNI, Rituxan, RUXIENCE, truxima What should I tell my care team before I take this medication? They need to know if you have any of these conditions: Chest pain Heart disease Immune system problems Infection, such as chickenpox, cold sores, hepatitis B, herpes Irregular heartbeat or rhythm Kidney disease Low blood counts, such as low white cells, platelets, red cells Lung disease Recent or upcoming vaccine An unusual or allergic reaction to rituximab, other medications, foods, dyes, or preservatives Pregnant or trying to get pregnant Breast-feeding How should I use this medication? This medication is injected into  a vein. It is given by a care team in a hospital or clinic setting. A special MedGuide will be given to you before each treatment. Be sure to read this information carefully each time. Talk to your care team about the use of this medication in children. While this medication may be prescribed for children as young as 6 months for selected conditions, precautions do apply. Overdosage: If you think you have taken too much of this medicine contact a poison control center or emergency room at once. NOTE: This medicine is only for you. Do not share this medicine with others. What if I miss a dose? Keep appointments for follow-up doses.  It is important not to miss your dose. Call your care team if you are unable to keep an appointment. What may interact with this medication? Do not take this medication with any of the following: Live vaccines This medication may also interact with the following: Cisplatin This list may not describe all possible interactions. Give your health care provider a list of all the medicines, herbs, non-prescription drugs, or dietary supplements you use. Also tell them if you smoke, drink alcohol, or use illegal drugs. Some items may interact with your medicine. What should I watch for while using this medication? Your condition will be monitored carefully while you are receiving this medication. You may need blood work while taking this medication. This medication can cause serious infusion reactions. To reduce the risk your care team may give you other medications to take before receiving this one. Be sure to follow the directions from your care team. This medication may increase your risk of getting an infection. Call your care team for advice if you get a fever, chills, sore throat, or other symptoms of a cold or flu. Do not treat yourself. Try to avoid being around people who are sick. Call your care team if you are around anyone with measles, chickenpox, or if you develop  sores or blisters that do not heal properly. Avoid taking medications that contain aspirin, acetaminophen, ibuprofen, naproxen, or ketoprofen unless instructed by your care team. These medications may hide a fever. This medication may cause serious skin reactions. They can happen weeks to months after starting the medication. Contact your care team right away if you notice fevers or flu-like symptoms with a rash. The rash may be red or purple and then turn into blisters or peeling of the skin. You may also notice a red rash with swelling of the face, lips, or lymph nodes in your neck or under your arms. In some patients, this medication may cause a serious brain infection that may cause death. If you have any problems seeing, thinking, speaking, walking, or standing, tell your care team right away. If you cannot reach your care team, urgently seek another source of medical care. Talk to your care team if you may be pregnant. Serious birth defects can occur if you take this medication during pregnancy and for 12 months after the last dose. You will need a negative pregnancy test before starting this medication. Contraception is recommended while taking this medication and for 12 months after the last dose. Your care team can help you find the option that works for you. Do not breastfeed while taking this medication and for at least 6 months after the last dose. What side effects may I notice from receiving this medication? Side effects that you should report to your care team as soon as possible: Allergic reactions or angioedema--skin rash, itching or hives, swelling of the face, eyes, lips, tongue, arms, or legs, trouble swallowing or breathing Bowel blockage--stomach cramping, unable to have a bowel movement or pass gas, loss of appetite, vomiting Dizziness, loss of balance or coordination, confusion or trouble speaking Heart attack--pain or tightness in the chest, shoulders, arms, or jaw, nausea,  shortness of breath, cold or clammy skin, feeling faint or lightheaded Heart rhythm changes--fast or irregular heartbeat, dizziness, feeling faint or lightheaded, chest pain, trouble breathing Infection--fever, chills, cough, sore throat, wounds that don't heal, pain or trouble when passing urine, general feeling of discomfort or being unwell Infusion reactions--chest pain, shortness of breath or trouble breathing, feeling faint  or lightheaded Kidney injury--decrease in the amount of urine, swelling of the ankles, hands, or feet Liver injury--right upper belly pain, loss of appetite, nausea, light-colored stool, dark yellow or brown urine, yellowing skin or eyes, unusual weakness or fatigue Redness, blistering, peeling, or loosening of the skin, including inside the mouth Stomach pain that is severe, does not go away, or gets worse Tumor lysis syndrome (TLS)--nausea, vomiting, diarrhea, decrease in the amount of urine, dark urine, unusual weakness or fatigue, confusion, muscle pain or cramps, fast or irregular heartbeat, joint pain Side effects that usually do not require medical attention (report to your care team if they continue or are bothersome): Headache Joint pain Nausea Runny or stuffy nose Unusual weakness or fatigue This list may not describe all possible side effects. Call your doctor for medical advice about side effects. You may report side effects to FDA at 1-800-FDA-1088. Where should I keep my medication? This medication is given in a hospital or clinic. It will not be stored at home. NOTE: This sheet is a summary. It may not cover all possible information. If you have questions about this medicine, talk to your doctor, pharmacist, or health care provider.  2024 [employer]vier/Gold Standard (2021-11-29 00:00:00)

## 2023-04-09 ENCOUNTER — Inpatient Hospital Stay: Payer: Medicare Other

## 2023-04-25 ENCOUNTER — Other Ambulatory Visit: Payer: Self-pay | Admitting: Oncology

## 2023-04-28 ENCOUNTER — Encounter: Payer: Self-pay | Admitting: Nurse Practitioner

## 2023-04-28 ENCOUNTER — Inpatient Hospital Stay: Payer: Medicare Other

## 2023-04-28 ENCOUNTER — Inpatient Hospital Stay: Payer: Medicare Other | Admitting: Nurse Practitioner

## 2023-04-28 ENCOUNTER — Telehealth: Payer: Self-pay

## 2023-04-28 ENCOUNTER — Other Ambulatory Visit: Payer: Self-pay

## 2023-04-28 ENCOUNTER — Inpatient Hospital Stay: Payer: Medicare Other | Attending: Nurse Practitioner

## 2023-04-28 VITALS — BP 129/49 | HR 61 | Temp 98.1°F | Resp 18

## 2023-04-28 VITALS — BP 152/61 | HR 70 | Temp 98.1°F | Resp 18 | Ht 62.0 in | Wt 144.1 lb

## 2023-04-28 DIAGNOSIS — Z5111 Encounter for antineoplastic chemotherapy: Secondary | ICD-10-CM | POA: Insufficient documentation

## 2023-04-28 DIAGNOSIS — D509 Iron deficiency anemia, unspecified: Secondary | ICD-10-CM | POA: Insufficient documentation

## 2023-04-28 DIAGNOSIS — I10 Essential (primary) hypertension: Secondary | ICD-10-CM | POA: Diagnosis not present

## 2023-04-28 DIAGNOSIS — Z23 Encounter for immunization: Secondary | ICD-10-CM | POA: Diagnosis not present

## 2023-04-28 DIAGNOSIS — Z85038 Personal history of other malignant neoplasm of large intestine: Secondary | ICD-10-CM | POA: Diagnosis not present

## 2023-04-28 DIAGNOSIS — C8580 Other specified types of non-Hodgkin lymphoma, unspecified site: Secondary | ICD-10-CM | POA: Diagnosis not present

## 2023-04-28 DIAGNOSIS — C8590 Non-Hodgkin lymphoma, unspecified, unspecified site: Secondary | ICD-10-CM | POA: Insufficient documentation

## 2023-04-28 DIAGNOSIS — Z5112 Encounter for antineoplastic immunotherapy: Secondary | ICD-10-CM | POA: Insufficient documentation

## 2023-04-28 LAB — CMP (CANCER CENTER ONLY)
ALT: 9 U/L (ref 0–44)
AST: 19 U/L (ref 15–41)
Albumin: 4 g/dL (ref 3.5–5.0)
Alkaline Phosphatase: 72 U/L (ref 38–126)
Anion gap: 9 (ref 5–15)
BUN: 16 mg/dL (ref 8–23)
CO2: 25 mmol/L (ref 22–32)
Calcium: 9 mg/dL (ref 8.9–10.3)
Chloride: 103 mmol/L (ref 98–111)
Creatinine: 1.11 mg/dL — ABNORMAL HIGH (ref 0.44–1.00)
GFR, Estimated: 45 mL/min — ABNORMAL LOW (ref >60)
Glucose, Bld: 88 mg/dL (ref 70–99)
Potassium: 4.4 mmol/L (ref 3.5–5.1)
Sodium: 137 mmol/L (ref 135–145)
Total Bilirubin: 0.4 mg/dL (ref 0.3–1.2)
Total Protein: 6.4 g/dL — ABNORMAL LOW (ref 6.5–8.1)

## 2023-04-28 LAB — CBC WITH DIFFERENTIAL (CANCER CENTER ONLY)
Abs Immature Granulocytes: 0.04 10*3/uL (ref 0.00–0.07)
Basophils Absolute: 0.1 10*3/uL (ref 0.0–0.1)
Basophils Relative: 1 %
Eosinophils Absolute: 0.1 10*3/uL (ref 0.0–0.5)
Eosinophils Relative: 2 %
HCT: 35.7 % — ABNORMAL LOW (ref 36.0–46.0)
Hemoglobin: 12.1 g/dL (ref 12.0–15.0)
Immature Granulocytes: 1 %
Lymphocytes Relative: 11 %
Lymphs Abs: 0.6 10*3/uL — ABNORMAL LOW (ref 0.7–4.0)
MCH: 28.4 pg (ref 26.0–34.0)
MCHC: 33.9 g/dL (ref 30.0–36.0)
MCV: 83.8 fL (ref 80.0–100.0)
Monocytes Absolute: 1.2 10*3/uL — ABNORMAL HIGH (ref 0.1–1.0)
Monocytes Relative: 23 %
Neutro Abs: 3.4 10*3/uL (ref 1.7–7.7)
Neutrophils Relative %: 62 %
Platelet Count: 343 10*3/uL (ref 150–400)
RBC: 4.26 MIL/uL (ref 3.87–5.11)
RDW: 14.1 % (ref 11.5–15.5)
WBC Count: 5.4 10*3/uL (ref 4.0–10.5)
nRBC: 0 % (ref 0.0–0.2)

## 2023-04-28 LAB — LACTATE DEHYDROGENASE: LDH: 172 U/L (ref 98–192)

## 2023-04-28 MED ORDER — INFLUENZA VAC A&B SURF ANT ADJ 0.5 ML IM SUSY
0.5000 mL | PREFILLED_SYRINGE | Freq: Once | INTRAMUSCULAR | Status: AC
  Administered 2023-04-28: 0.5 mL via INTRAMUSCULAR
  Filled 2023-04-28: qty 0.5

## 2023-04-28 MED ORDER — SODIUM CHLORIDE 0.9 % IV SOLN
10.0000 mg | Freq: Once | INTRAVENOUS | Status: AC
  Administered 2023-04-28: 10 mg via INTRAVENOUS
  Filled 2023-04-28: qty 10

## 2023-04-28 MED ORDER — SODIUM CHLORIDE 0.9% FLUSH
10.0000 mL | INTRAVENOUS | Status: DC | PRN
  Administered 2023-04-28: 10 mL

## 2023-04-28 MED ORDER — VINCRISTINE SULFATE CHEMO INJECTION 1 MG/ML
1.0000 mg | Freq: Once | INTRAVENOUS | Status: AC
  Administered 2023-04-28: 1 mg via INTRAVENOUS
  Filled 2023-04-28: qty 1

## 2023-04-28 MED ORDER — SODIUM CHLORIDE 0.9 % IV SOLN
150.0000 mg | Freq: Once | INTRAVENOUS | Status: AC
  Administered 2023-04-28: 150 mg via INTRAVENOUS
  Filled 2023-04-28: qty 150

## 2023-04-28 MED ORDER — SODIUM CHLORIDE 0.9 % IV SOLN
300.0000 mg/m2 | Freq: Once | INTRAVENOUS | Status: AC
  Administered 2023-04-28: 500 mg via INTRAVENOUS
  Filled 2023-04-28: qty 25

## 2023-04-28 MED ORDER — SODIUM CHLORIDE 0.9 % IV SOLN: Freq: Once | INTRAVENOUS | Status: AC

## 2023-04-28 MED ORDER — PALONOSETRON HCL INJECTION 0.25 MG/5ML
0.2500 mg | Freq: Once | INTRAVENOUS | Status: AC
  Administered 2023-04-28: 0.25 mg via INTRAVENOUS
  Filled 2023-04-28: qty 5

## 2023-04-28 MED ORDER — DOXORUBICIN HCL CHEMO IV INJECTION 2 MG/ML
25.0000 mg/m2 | Freq: Once | INTRAVENOUS | Status: AC
  Administered 2023-04-28: 42 mg via INTRAVENOUS
  Filled 2023-04-28: qty 21

## 2023-04-28 MED ORDER — SODIUM CHLORIDE 0.9 % IV SOLN
375.0000 mg/m2 | Freq: Once | INTRAVENOUS | Status: AC
  Administered 2023-04-28: 600 mg via INTRAVENOUS
  Filled 2023-04-28: qty 50

## 2023-04-28 MED ORDER — HEPARIN SOD (PORK) LOCK FLUSH 100 UNIT/ML IV SOLN
500.0000 [IU] | Freq: Once | INTRAVENOUS | Status: AC | PRN
  Administered 2023-04-28: 500 [IU]

## 2023-04-28 MED ORDER — DIPHENHYDRAMINE HCL 25 MG PO CAPS
25.0000 mg | ORAL_CAPSULE | Freq: Once | ORAL | Status: AC
  Administered 2023-04-28: 25 mg via ORAL
  Filled 2023-04-28: qty 1

## 2023-04-28 MED ORDER — ACETAMINOPHEN 325 MG PO TABS
650.0000 mg | ORAL_TABLET | Freq: Once | ORAL | Status: AC
  Administered 2023-04-28: 650 mg via ORAL
  Filled 2023-04-28: qty 2

## 2023-04-28 NOTE — Progress Notes (Signed)
[facility] OFFICE PROGRESS NOTE   Diagnosis: Non-Hodgkin's lymphoma  INTERVAL HISTORY:   [facility] returns as scheduled.  She completed cycle 2 R-mini CHOP 04/07/2023.  She denies nausea/vomiting.  No mouth sores.  No diarrhea or constipation.  She has occasional tingling in the toes.  She notes a decrease in her energy level for about 2 weeks following treatment.  Energy level then recovers.  Objective:  Vital signs in last 24 hours:  Blood pressure (!) 152/61, pulse 70, temperature 98.1 F (36.7 C), temperature source Temporal, resp. rate 18, height 5\' 2"  (1.575 m), weight 144 lb 1.6 oz (65.4 kg), SpO2 98%.    HEENT: No thrush or ulcers. Lymphatics: No palpable cervical, supraclavicular or axillary lymph nodes.  Fullness lower medial left axillary/chest wall region, no discrete lymph node mass, mild associated tenderness. Resp: Lungs clear bilaterally. Cardio: Regular rate and rhythm. GI: No hepatosplenomegaly. Vascular: No leg edema. Neuro: Vibratory sense mildly decreased over the fingertips per tuning fork exam. Skin: No rash. Port-A-Cath without erythema.  Lab Results:  Lab Results  Component Value Date   WBC 5.4 04/28/2023   HGB 12.1 04/28/2023   HCT 35.7 (L) 04/28/2023   MCV 83.8 04/28/2023   PLT 343 04/28/2023   NEUTROABS 3.4 04/28/2023    Imaging:  No results found.  Medications: I have reviewed the patient's current medications.  Assessment/Plan: Colon cancer-stage IIIb Cecal mass on colonoscopy 01/03/2020, biopsy confirmed adenocarcinoma CTs 01/11/2020-cecal mass, prominent subcentimeter mesenteric nodes adjacent to the cecum, multiple tiny pulmonary nodules-metastases not favored Laparoscopic right colectomy 02/10/2020,pT3pN1a, grade 2-grade 3, 1/27 lymph nodes, 1 tumor deposit, MSI-high, loss of MLH1 and PMS 2 expression; MLH1 methylation detected CTs 02/05/2021-no evidence of recurrent disease, stable tiny upper lobe pulmonary nodules  considered benign CTs 02/12/2022-nonspecific lymphadenopathy in the mesentery with mild mesenteric stranding, largest nodes measure up to 8 mm with no evidence of metastatic disease in the chest CTs 01/13/2023-new pleural-based masses in the lateral left upper chest with destruction of the left anterior second rib, no other evidence of metastatic disease CT-guided biopsy of left chest wall mass 02/21/2023-B-cell lymphoma with a high Ki-67, Bcl-2 positive, negative for double hit or triple hit lymphoma, large B-cell lymphoma per communication with the pathologist PET 03/11/2023-intense hypermetabolic activity associated with left chest wall mass, no evidence of additional sites of lymphoma, no evidence of recurrent colon cancer Cycle 1R-mini CHOP 03/14/2023 Cycle 2R-mini CHOP 04/07/2023 Cycle 3R-mini CHOP 04/28/2023 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 and history of AVMs/Cameron's erosions Ferrous gluconate 07/24/2020-allergic/infusion reaction with chest and back pain, diarrhea, nausea/vomiting, chills, presyncope, hypotension Feraheme 08/14/2020 and 08/21/2020-tolerated well Hiatal hernia Family history of colon and bladder cancer Transverse colon tubular adenoma on the colonoscopy 01/03/2020 Hypertension Glaucoma Admission 02/12/2022 with choledocholithiasis, status post ERCP and stone removal Left chest wall masses on CT 01/13/2023-CT-guided biopsy of left chest wall mass 02/21/2023 Hypercalcemia 03/07/2023-Zometa and IV fluids Echocardiogram 03/10/2023-LVEF 65%, normal left and right ventricular function  Disposition: [facility] appears stable.  She has completed 2 cycles of R mini CHOP.  She is tolerating treatment well.  The left chest wall/axillary mass has improved significantly.  Plan to proceed with cycle 3 today as scheduled.  Restaging PET scan after she has completed 4 cycles of R-mini CHOP.  CBC and chemistry panel reviewed.  Labs adequate to proceed as above.  She will return for follow-up and  treatment as scheduled in 3 weeks.   [facility] ANP/GNP-BC   04/28/2023  9:20  AM

## 2023-04-28 NOTE — Patient Instructions (Signed)

## 2023-04-28 NOTE — Progress Notes (Signed)
Patient seen by Lonna Cobb NP today  Vitals are within treatment parameters:Yes   Labs are within treatment parameters: Yes   Treatment plan has been signed: Yes   Per physician team, Patient is ready for treatment and there are NO modifications to the treatment plan. Patient is request a Fluad vaccine.

## 2023-04-28 NOTE — Patient Instructions (Signed)
[place] [facility]  Discharge Instructions: Thank you for choosing [facility] to provide your oncology and hematology care.   If you have a lab appointment with the Cancer Center, please go directly to the Cancer Center and check in at the registration area.   Wear comfortable clothing and clothing appropriate for easy access to any Portacath or PICC line.   We strive to give you quality time with your provider. You may need to reschedule your appointment if you arrive late (15 or more minutes).  Arriving late affects you and other patients whose appointments are after yours.  Also, if you miss three or more appointments without notifying the office, you may be dismissed from the clinic at the provider's discretion.      For prescription refill requests, have your pharmacy contact our office and allow 72 hours for refills to be completed.    Today you received the following chemotherapy and/or immunotherapy agents: adriamycin, vincristine, cytoxan, rituximab.       To help prevent nausea and vomiting after your treatment, we encourage you to take your nausea medication as directed.  BELOW ARE SYMPTOMS THAT SHOULD BE REPORTED IMMEDIATELY: *FEVER GREATER THAN 100.4 F (38 C) OR HIGHER *CHILLS OR SWEATING *NAUSEA AND VOMITING THAT IS NOT CONTROLLED WITH YOUR NAUSEA MEDICATION *UNUSUAL SHORTNESS OF BREATH *UNUSUAL BRUISING OR BLEEDING *URINARY PROBLEMS (pain or burning when urinating, or frequent urination) *BOWEL PROBLEMS (unusual diarrhea, constipation, pain near the anus) TENDERNESS IN MOUTH AND THROAT WITH OR WITHOUT PRESENCE OF ULCERS (sore throat, sores in mouth, or a toothache) UNUSUAL RASH, SWELLING OR PAIN  UNUSUAL VAGINAL DISCHARGE OR ITCHING   Items with * indicate a potential emergency and should be followed up as soon as possible or go to the Emergency Department if any problems should occur.  Please show the CHEMOTHERAPY ALERT CARD or  IMMUNOTHERAPY ALERT CARD at check-in to the Emergency Department and triage nurse.  Should you have questions after your visit or need to cancel or reschedule your appointment, please contact [place] [facility]  Dept: [phone]  and follow the prompts.  Office hours are 8:00 a.m. to 4:30 p.m. Monday - Friday. Please note that voicemails left after 4:00 p.m. may not be returned until the following business day.  We are closed weekends and major holidays. You have access to a nurse at all times for urgent questions. Please call the main number to the clinic Dept: [phone] and follow the prompts.   For any non-urgent questions, you may also contact your provider using MyChart. We now offer e-Visits for anyone 47 and older to request care online for non-urgent symptoms. For details visit [email or web address].   Also download the MyChart app! Go to the app store, search "MyChart", open the app, select [facility], and log in with your MyChart username and password.  Doxorubicin Injection What is this medication? DOXORUBICIN (dox oh ROO bi sin) treats some types of cancer. It works by slowing down the growth of cancer cells. This medicine may be used for other purposes; ask your health care provider or pharmacist if you have questions. COMMON BRAND NAME(S): Adriamycin, Adriamycin PFS, Adriamycin RDF, Rubex What should I tell my care team before I take this medication? They need to know if you have any of these conditions: Heart disease History of low blood cell levels caused by a medication Liver disease Recent or ongoing radiation An unusual or allergic reaction to doxorubicin, other  medications, foods, dyes, or preservatives If you or your partner are pregnant or trying to get pregnant Breast-feeding How should I use this medication? This medication is injected into a vein. It is given by your care team in a hospital or clinic setting. Talk to your care team  about the use of this medication in children. Special care may be needed. Overdosage: If you think you have taken too much of this medicine contact a poison control center or emergency room at once. NOTE: This medicine is only for you. Do not share this medicine with others. What if I miss a dose? Keep appointments for follow-up doses. It is important not to miss your dose. Call your care team if you are unable to keep an appointment. What may interact with this medication? 6-mercaptopurine Paclitaxel Phenytoin St. John's wort Trastuzumab Verapamil This list may not describe all possible interactions. Give your health care provider a list of all the medicines, herbs, non-prescription drugs, or dietary supplements you use. Also tell them if you smoke, drink alcohol, or use illegal drugs. Some items may interact with your medicine. What should I watch for while using this medication? Your condition will be monitored carefully while you are receiving this medication. You may need blood work while taking this medication. This medication may make you feel generally unwell. This is not uncommon as chemotherapy can affect healthy cells as well as cancer cells. Report any side effects. Continue your course of treatment even though you feel ill unless your care team tells you to stop. There is a maximum amount of this medication you should receive throughout your life. The amount depends on the medical condition being treated and your overall health. Your care team will watch how much of this medication you receive. Tell your care team if you have taken this medication before. Your urine may turn red for a few days after your dose. This is not blood. If your urine is dark or brown, call your care team. In some cases, you may be given additional medications to help with side effects. Follow all directions for their use. This medication may increase your risk of getting an infection. Call your care team for  advice if you get a fever, chills, sore throat, or other symptoms of a cold or flu. Do not treat yourself. Try to avoid being around people who are sick. This medication may increase your risk to bruise or bleed. Call your care team if you notice any unusual bleeding. Talk to your care team about your risk of cancer. You may be more at risk for certain types of cancers if you take this medication. Talk to your care team if you or your partner may be pregnant. Serious birth defects can occur if you take this medication during pregnancy and for 6 months after the last dose. Contraception is recommended while taking this medication and for 6 months after the last dose. Your care team can help you find the option that works for you. If your partner can get pregnant, use a condom while taking this medication and for 6 months after the last dose. Do not breastfeed while taking this medication. This medication may cause infertility. Talk to your care team if you are concerned about your fertility. What side effects may I notice from receiving this medication? Side effects that you should report to your care team as soon as possible: Allergic reactions--skin rash, itching, hives, swelling of the face, lips, tongue, or throat Heart failure--shortness of  breath, swelling of the ankles, feet, or hands, sudden weight gain, unusual weakness or fatigue Heart rhythm changes--fast or irregular heartbeat, dizziness, feeling faint or lightheaded, chest pain, trouble breathing Infection--fever, chills, cough, sore throat, wounds that don't heal, pain or trouble when passing urine, general feeling of discomfort or being unwell Low red blood cell level--unusual weakness or fatigue, dizziness, headache, trouble breathing Painful swelling, warmth, or redness of the skin, blisters or sores at the infusion site Unusual bruising or bleeding Side effects that usually do not require medical attention (report to your care team  if they continue or are bothersome): Diarrhea Hair loss Nausea Pain, redness, or swelling with sores inside the mouth or throat Red urine This list may not describe all possible side effects. Call your doctor for medical advice about side effects. You may report side effects to FDA at 1-800-FDA-1088. Where should I keep my medication? This medication is given in a hospital or clinic. It will not be stored at home. NOTE: This sheet is a summary. It may not cover all possible information. If you have questions about this medicine, talk to your doctor, pharmacist, or health care provider.  2024 Elsevier/Gold Standard (2022-10-10 00:00:00) Vincristine Injection What is this medication? VINCRISTINE (vin KRIS teen) treats some types of cancer. It works by slowing down the growth of cancer cells. This medicine may be used for other purposes; ask your health care provider or pharmacist if you have questions. COMMON BRAND NAME(S): Oncovin, Vincasar PFS What should I tell my care team before I take this medication? They need to know if you have any of these conditions: Infection Kidney disease Liver disease Low white blood cell levels Lung disease Nervous system disease, such as Charcot-Marie-Tooth (CMT) Recent or ongoing radiation therapy An unusual or allergic reaction to vincristine, other chemotherapy agents, other medications, foods, dyes, or preservatives Pregnant or trying to get pregnant Breast-feeding How should I use this medication? This medication is infused into a vein. It is given by your care team in a hospital or clinic setting. Talk to your care team about the use of this medication in children. While it may be given to children for selected conditions, precautions do apply. Overdosage: If you think you have taken too much of this medicine contact a poison control center or emergency room at once. NOTE: This medicine is only for you. Do not share this medicine with  others. What if I miss a dose? Keep appointments for follow-up doses. It is important not to miss your dose. Call your care team if you are unable to keep an appointment. What may interact with this medication? Do not take this medication with any of the following: Live virus vaccines This medication may also interact with the following: Medications for fungal infections, such as itraconazole or fluconazole Phenytoin Supplements, such as St. John's wort This list may not describe all possible interactions. Give your health care provider a list of all the medicines, herbs, non-prescription drugs, or dietary supplements you use. Also tell them if you smoke, drink alcohol, or use illegal drugs. Some items may interact with your medicine. What should I watch for while using this medication? Your condition will be monitored carefully while you are receiving this medication. This medication may make you feel generally unwell. This is not uncommon as chemotherapy can affect healthy cells as well as cancer cells. Report any side effects. Continue your course of treatment even though you feel ill unless your care team tells you to stop.  You may need blood work while taking this medication. This medication may increase your risk to bruise or bleed. Call your care team if you notice any unusual bleeding. This medication may increase your risk of getting an infection. Call your care team for advice if you get a fever, chills, sore throat, or other symptoms of a cold or flu. Do not treat yourself. Try to avoid being around people who are sick. This medication will cause constipation. If you do not have a bowel movement for 3 days, call your care team. Call your care team if you are around anyone with measles, chickenpox, or if you develop sores or blisters that do not heal properly. Be careful brushing or flossing your teeth or using a toothpick because you may get an infection or bleed more easily. If you  have any dental work done, tell your dentist you are receiving this medication Talk to your care team if you or your partner wish to become pregnant or think either of you might be pregnant. This medication can cause serious birth defects. This medication may cause infertility. Talk to your care team if you are concerned about your fertility. Talk to your care team before breastfeeding. Changes to your treatment plan may be needed. What side effects may I notice from receiving this medication? Side effects that you should report to your care team as soon as possible: Allergic reactions--skin rash, itching, hives, swelling of the face, lips, tongue, or throat High uric acid level--severe pain, redness, warmth, or swelling in joints, pain or trouble passing urine, pain in the lower back or sides Infection--fever, chills, cough, sore throat, wounds that don't heal, pain or trouble when passing urine, general feeling of discomfort or being unwell Pain, tingling, or numbness in the hands or feet, muscle weakness, change in vision, confusion or trouble speaking, loss of balance or coordination, trouble walking, seizures Painful swelling, warmth, or redness of the skin, blisters or sores at the infusion site Shortness of breath or trouble breathing Side effects that usually do not require medical attention (report to your care team if they continue or are bothersome): Constipation Diarrhea Hair loss Loss of appetite Nausea Stomach cramping Vomiting This list may not describe all possible side effects. Call your doctor for medical advice about side effects. You may report side effects to FDA at 1-800-FDA-1088. Where should I keep my medication? This medication is given in a hospital or clinic. It will not be stored at home. NOTE: This sheet is a summary. It may not cover all possible information. If you have questions about this medicine, talk to your doctor, pharmacist, or health care provider.   2024 Elsevier/Gold Standard (2021-10-02 00:00:00) Cyclophosphamide Injection What is this medication? CYCLOPHOSPHAMIDE (sye kloe FOSS fa mide) treats some types of cancer. It works by slowing down the growth of cancer cells. This medicine may be used for other purposes; ask your health care provider or pharmacist if you have questions. COMMON BRAND NAME(S): Cyclophosphamide, Cytoxan, Neosar What should I tell my care team before I take this medication? They need to know if you have any of these conditions: Heart disease Irregular heartbeat or rhythm Infection Kidney problems Liver disease Low blood cell levels (white cells, platelets, or red blood cells) Lung disease Previous radiation Trouble passing urine An unusual or allergic reaction to cyclophosphamide, other medications, foods, dyes, or preservatives Pregnant or trying to get pregnant Breast-feeding How should I use this medication? This medication is injected into a vein. It is  given by your care team in a hospital or clinic setting. Talk to your care team about the use of this medication in children. Special care may be needed. Overdosage: If you think you have taken too much of this medicine contact a poison control center or emergency room at once. NOTE: This medicine is only for you. Do not share this medicine with others. What if I miss a dose? Keep appointments for follow-up doses. It is important not to miss your dose. Call your care team if you are unable to keep an appointment. What may interact with this medication? Amphotericin B Amiodarone Azathioprine Certain antivirals for HIV or hepatitis Certain medications for blood pressure, such as enalapril, lisinopril, quinapril Cyclosporine Diuretics Etanercept Indomethacin Medications that relax muscles Metronidazole Natalizumab Tamoxifen Warfarin This list may not describe all possible interactions. Give your health care provider a list of all the medicines,  herbs, non-prescription drugs, or dietary supplements you use. Also tell them if you smoke, drink alcohol, or use illegal drugs. Some items may interact with your medicine. What should I watch for while using this medication? This medication may make you feel generally unwell. This is not uncommon as chemotherapy can affect healthy cells as well as cancer cells. Report any side effects. Continue your course of treatment even though you feel ill unless your care team tells you to stop. You may need blood work while you are taking this medication. This medication may increase your risk of getting an infection. Call your care team for advice if you get a fever, chills, sore throat, or other symptoms of a cold or flu. Do not treat yourself. Try to avoid being around people who are sick. Avoid taking medications that contain aspirin, acetaminophen, ibuprofen, naproxen, or ketoprofen unless instructed by your care team. These medications may hide a fever. Be careful brushing or flossing your teeth or using a toothpick because you may get an infection or bleed more easily. If you have any dental work done, tell your dentist you are receiving this medication. Drink water or other fluids as directed. Urinate often, even at night. Some products may contain alcohol. Ask your care team if this medication contains alcohol. Be sure to tell all care teams you are taking this medicine. Certain medicines, like metronidazole and disulfiram, can cause an unpleasant reaction when taken with alcohol. The reaction includes flushing, headache, nausea, vomiting, sweating, and increased thirst. The reaction can last from 30 minutes to several hours. Talk to your care team if you wish to become pregnant or think you might be pregnant. This medication can cause serious birth defects if taken during pregnancy and for 1 year after the last dose. A negative pregnancy test is required before starting this medication. A reliable form of  contraception is recommended while taking this medication and for 1 year after the last dose. Talk to your care team about reliable forms of contraception. Do not father a child while taking this medication and for 4 months after the last dose. Use a condom during this time period. Do not breast-feed while taking this medication or for 1 week after the last dose. This medication may cause infertility. Talk to your care team if you are concerned about your fertility. Talk to your care team about your risk of cancer. You may be more at risk for certain types of cancer if you take this medication. What side effects may I notice from receiving this medication? Side effects that you should report to your  care team as soon as possible: Allergic reactions--skin rash, itching, hives, swelling of the face, lips, tongue, or throat Dry cough, shortness of breath or trouble breathing Heart failure--shortness of breath, swelling of the ankles, feet, or hands, sudden weight gain, unusual weakness or fatigue Heart muscle inflammation--unusual weakness or fatigue, shortness of breath, chest pain, fast or irregular heartbeat, dizziness, swelling of the ankles, feet, or hands Heart rhythm changes--fast or irregular heartbeat, dizziness, feeling faint or lightheaded, chest pain, trouble breathing Infection--fever, chills, cough, sore throat, wounds that don't heal, pain or trouble when passing urine, general feeling of discomfort or being unwell Kidney injury--decrease in the amount of urine, swelling of the ankles, hands, or feet Liver injury--right upper belly pain, loss of appetite, nausea, light-colored stool, dark yellow or brown urine, yellowing skin or eyes, unusual weakness or fatigue Low red blood cell level--unusual weakness or fatigue, dizziness, headache, trouble breathing Low sodium level--muscle weakness, fatigue, dizziness, headache, confusion Red or dark brown urine Unusual bruising or bleeding Side  effects that usually do not require medical attention (report to your care team if they continue or are bothersome): Hair loss Irregular menstrual cycles or spotting Loss of appetite Nausea Pain, redness, or swelling with sores inside the mouth or throat Vomiting This list may not describe all possible side effects. Call your doctor for medical advice about side effects. You may report side effects to FDA at 1-800-FDA-1088. Where should I keep my medication? This medication is given in a hospital or clinic. It will not be stored at home. NOTE: This sheet is a summary. It may not cover all possible information. If you have questions about this medicine, talk to your doctor, pharmacist, or health care provider.  2024 Elsevier/Gold Standard (2021-11-23 00:00:00) Rituximab Injection What is this medication? RITUXIMAB (ri TUX i mab) treats leukemia and lymphoma. It works by blocking a protein that causes cancer cells to grow and multiply. This helps to slow or stop the spread of cancer cells. It may also be used to treat autoimmune conditions, such as arthritis. It works by slowing down an overactive immune system. It is a monoclonal antibody. This medicine may be used for other purposes; ask your health care provider or pharmacist if you have questions. COMMON BRAND NAME(S): RIABNI, Rituxan, RUXIENCE, truxima What should I tell my care team before I take this medication? They need to know if you have any of these conditions: Chest pain Heart disease Immune system problems Infection, such as chickenpox, cold sores, hepatitis B, herpes Irregular heartbeat or rhythm Kidney disease Low blood counts, such as low white cells, platelets, red cells Lung disease Recent or upcoming vaccine An unusual or allergic reaction to rituximab, other medications, foods, dyes, or preservatives Pregnant or trying to get pregnant Breast-feeding How should I use this medication? This medication is injected into  a vein. It is given by a care team in a hospital or clinic setting. A special MedGuide will be given to you before each treatment. Be sure to read this information carefully each time. Talk to your care team about the use of this medication in children. While this medication may be prescribed for children as young as 6 months for selected conditions, precautions do apply. Overdosage: If you think you have taken too much of this medicine contact a poison control center or emergency room at once. NOTE: This medicine is only for you. Do not share this medicine with others. What if I miss a dose? Keep appointments for follow-up doses.  It is important not to miss your dose. Call your care team if you are unable to keep an appointment. What may interact with this medication? Do not take this medication with any of the following: Live vaccines This medication may also interact with the following: Cisplatin This list may not describe all possible interactions. Give your health care provider a list of all the medicines, herbs, non-prescription drugs, or dietary supplements you use. Also tell them if you smoke, drink alcohol, or use illegal drugs. Some items may interact with your medicine. What should I watch for while using this medication? Your condition will be monitored carefully while you are receiving this medication. You may need blood work while taking this medication. This medication can cause serious infusion reactions. To reduce the risk your care team may give you other medications to take before receiving this one. Be sure to follow the directions from your care team. This medication may increase your risk of getting an infection. Call your care team for advice if you get a fever, chills, sore throat, or other symptoms of a cold or flu. Do not treat yourself. Try to avoid being around people who are sick. Call your care team if you are around anyone with measles, chickenpox, or if you develop  sores or blisters that do not heal properly. Avoid taking medications that contain aspirin, acetaminophen, ibuprofen, naproxen, or ketoprofen unless instructed by your care team. These medications may hide a fever. This medication may cause serious skin reactions. They can happen weeks to months after starting the medication. Contact your care team right away if you notice fevers or flu-like symptoms with a rash. The rash may be red or purple and then turn into blisters or peeling of the skin. You may also notice a red rash with swelling of the face, lips, or lymph nodes in your neck or under your arms. In some patients, this medication may cause a serious brain infection that may cause death. If you have any problems seeing, thinking, speaking, walking, or standing, tell your care team right away. If you cannot reach your care team, urgently seek another source of medical care. Talk to your care team if you may be pregnant. Serious birth defects can occur if you take this medication during pregnancy and for 12 months after the last dose. You will need a negative pregnancy test before starting this medication. Contraception is recommended while taking this medication and for 12 months after the last dose. Your care team can help you find the option that works for you. Do not breastfeed while taking this medication and for at least 6 months after the last dose. What side effects may I notice from receiving this medication? Side effects that you should report to your care team as soon as possible: Allergic reactions or angioedema--skin rash, itching or hives, swelling of the face, eyes, lips, tongue, arms, or legs, trouble swallowing or breathing Bowel blockage--stomach cramping, unable to have a bowel movement or pass gas, loss of appetite, vomiting Dizziness, loss of balance or coordination, confusion or trouble speaking Heart attack--pain or tightness in the chest, shoulders, arms, or jaw, nausea,  shortness of breath, cold or clammy skin, feeling faint or lightheaded Heart rhythm changes--fast or irregular heartbeat, dizziness, feeling faint or lightheaded, chest pain, trouble breathing Infection--fever, chills, cough, sore throat, wounds that don't heal, pain or trouble when passing urine, general feeling of discomfort or being unwell Infusion reactions--chest pain, shortness of breath or trouble breathing, feeling faint  or lightheaded Kidney injury--decrease in the amount of urine, swelling of the ankles, hands, or feet Liver injury--right upper belly pain, loss of appetite, nausea, light-colored stool, dark yellow or brown urine, yellowing skin or eyes, unusual weakness or fatigue Redness, blistering, peeling, or loosening of the skin, including inside the mouth Stomach pain that is severe, does not go away, or gets worse Tumor lysis syndrome (TLS)--nausea, vomiting, diarrhea, decrease in the amount of urine, dark urine, unusual weakness or fatigue, confusion, muscle pain or cramps, fast or irregular heartbeat, joint pain Side effects that usually do not require medical attention (report to your care team if they continue or are bothersome): Headache Joint pain Nausea Runny or stuffy nose Unusual weakness or fatigue This list may not describe all possible side effects. Call your doctor for medical advice about side effects. You may report side effects to FDA at 1-800-FDA-1088. Where should I keep my medication? This medication is given in a hospital or clinic. It will not be stored at home. NOTE: This sheet is a summary. It may not cover all possible information. If you have questions about this medicine, talk to your doctor, pharmacist, or health care provider.  2024 [employer]vier/Gold Standard (2021-11-29 00:00:00)

## 2023-04-28 NOTE — Telephone Encounter (Signed)
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

## 2023-04-30 ENCOUNTER — Ambulatory Visit: Payer: Medicare Other

## 2023-05-17 ENCOUNTER — Other Ambulatory Visit: Payer: Self-pay | Admitting: Oncology

## 2023-05-19 ENCOUNTER — Inpatient Hospital Stay: Payer: Medicare Other | Admitting: Oncology

## 2023-05-19 ENCOUNTER — Encounter: Payer: Self-pay | Admitting: *Deleted

## 2023-05-19 ENCOUNTER — Inpatient Hospital Stay: Payer: Medicare Other

## 2023-05-19 ENCOUNTER — Telehealth: Payer: Self-pay

## 2023-05-19 ENCOUNTER — Encounter: Payer: Self-pay | Admitting: Oncology

## 2023-05-19 VITALS — BP 136/53 | HR 68 | Temp 98.1°F | Resp 18

## 2023-05-19 DIAGNOSIS — C8580 Other specified types of non-Hodgkin lymphoma, unspecified site: Secondary | ICD-10-CM

## 2023-05-19 DIAGNOSIS — D509 Iron deficiency anemia, unspecified: Secondary | ICD-10-CM | POA: Diagnosis not present

## 2023-05-19 DIAGNOSIS — Z5112 Encounter for antineoplastic immunotherapy: Secondary | ICD-10-CM | POA: Diagnosis not present

## 2023-05-19 DIAGNOSIS — C8590 Non-Hodgkin lymphoma, unspecified, unspecified site: Secondary | ICD-10-CM | POA: Diagnosis not present

## 2023-05-19 DIAGNOSIS — Z5111 Encounter for antineoplastic chemotherapy: Secondary | ICD-10-CM | POA: Diagnosis not present

## 2023-05-19 DIAGNOSIS — I10 Essential (primary) hypertension: Secondary | ICD-10-CM | POA: Diagnosis not present

## 2023-05-19 DIAGNOSIS — Z85038 Personal history of other malignant neoplasm of large intestine: Secondary | ICD-10-CM | POA: Diagnosis not present

## 2023-05-19 DIAGNOSIS — Z23 Encounter for immunization: Secondary | ICD-10-CM | POA: Diagnosis not present

## 2023-05-19 LAB — CBC WITH DIFFERENTIAL (CANCER CENTER ONLY)
Abs Immature Granulocytes: 0.05 10*3/uL (ref 0.00–0.07)
Basophils Absolute: 0.1 10*3/uL (ref 0.0–0.1)
Basophils Relative: 2 %
Eosinophils Absolute: 0.1 10*3/uL (ref 0.0–0.5)
Eosinophils Relative: 2 %
HCT: 36.5 % (ref 36.0–46.0)
Hemoglobin: 11.9 g/dL — ABNORMAL LOW (ref 12.0–15.0)
Immature Granulocytes: 1 %
Lymphocytes Relative: 13 %
Lymphs Abs: 0.6 10*3/uL — ABNORMAL LOW (ref 0.7–4.0)
MCH: 27.5 pg (ref 26.0–34.0)
MCHC: 32.6 g/dL (ref 30.0–36.0)
MCV: 84.3 fL (ref 80.0–100.0)
Monocytes Absolute: 1.1 10*3/uL — ABNORMAL HIGH (ref 0.1–1.0)
Monocytes Relative: 25 %
Neutro Abs: 2.7 10*3/uL (ref 1.7–7.7)
Neutrophils Relative %: 57 %
Platelet Count: 312 10*3/uL (ref 150–400)
RBC: 4.33 MIL/uL (ref 3.87–5.11)
RDW: 14.3 % (ref 11.5–15.5)
WBC Count: 4.7 10*3/uL (ref 4.0–10.5)
nRBC: 0 % (ref 0.0–0.2)

## 2023-05-19 LAB — CMP (CANCER CENTER ONLY)
ALT: 9 U/L (ref 0–44)
AST: 22 U/L (ref 15–41)
Albumin: 4.1 g/dL (ref 3.5–5.0)
Alkaline Phosphatase: 67 U/L (ref 38–126)
Anion gap: 7 (ref 5–15)
BUN: 18 mg/dL (ref 8–23)
CO2: 26 mmol/L (ref 22–32)
Calcium: 9.6 mg/dL (ref 8.9–10.3)
Chloride: 104 mmol/L (ref 98–111)
Creatinine: 1.1 mg/dL — ABNORMAL HIGH (ref 0.44–1.00)
GFR, Estimated: 46 mL/min — ABNORMAL LOW (ref >60)
Glucose, Bld: 87 mg/dL (ref 70–99)
Potassium: 4.3 mmol/L (ref 3.5–5.1)
Sodium: 137 mmol/L (ref 135–145)
Total Bilirubin: 0.4 mg/dL (ref 0.3–1.2)
Total Protein: 6.5 g/dL (ref 6.5–8.1)

## 2023-05-19 MED ORDER — DIPHENHYDRAMINE HCL 25 MG PO CAPS
25.0000 mg | ORAL_CAPSULE | Freq: Once | ORAL | Status: AC
Start: 2023-05-19 — End: 2023-05-19
  Administered 2023-05-19: 25 mg via ORAL
  Filled 2023-05-19: qty 1

## 2023-05-19 MED ORDER — SODIUM CHLORIDE 0.9 % IV SOLN: Freq: Once | INTRAVENOUS | Status: AC

## 2023-05-19 MED ORDER — ACETAMINOPHEN 325 MG PO TABS
650.0000 mg | ORAL_TABLET | Freq: Once | ORAL | Status: AC
  Administered 2023-05-19: 650 mg via ORAL
  Filled 2023-05-19: qty 2

## 2023-05-19 MED ORDER — FOSAPREPITANT DIMEGLUMINE INJECTION 150 MG
150.0000 mg | Freq: Once | INTRAVENOUS | Status: AC
  Administered 2023-05-19: 150 mg via INTRAVENOUS
  Filled 2023-05-19: qty 150

## 2023-05-19 MED ORDER — DEXAMETHASONE SODIUM PHOSPHATE 10 MG/ML IJ SOLN
10.0000 mg | Freq: Once | INTRAMUSCULAR | Status: AC
Start: 2023-05-19 — End: 2023-05-19
  Administered 2023-05-19: 10 mg via INTRAVENOUS
  Filled 2023-05-19: qty 1

## 2023-05-19 MED ORDER — SODIUM CHLORIDE 0.9 % IV SOLN
375.0000 mg/m2 | Freq: Once | INTRAVENOUS | Status: DC
  Filled 2023-05-19: qty 60

## 2023-05-19 MED ORDER — HEPARIN SOD (PORK) LOCK FLUSH 100 UNIT/ML IV SOLN
500.0000 [IU] | Freq: Once | INTRAVENOUS | Status: AC | PRN
  Administered 2023-05-19: 500 [IU]

## 2023-05-19 MED ORDER — SODIUM CHLORIDE 0.9 % IV SOLN
375.0000 mg/m2 | Freq: Once | INTRAVENOUS | Status: AC
  Administered 2023-05-19: 600 mg via INTRAVENOUS
  Filled 2023-05-19: qty 50

## 2023-05-19 MED ORDER — SODIUM CHLORIDE 0.9% FLUSH
10.0000 mL | INTRAVENOUS | Status: DC | PRN
  Administered 2023-05-19: 10 mL

## 2023-05-19 MED ORDER — SODIUM CHLORIDE 0.9 % IV SOLN
300.0000 mg/m2 | Freq: Once | INTRAVENOUS | Status: AC
  Administered 2023-05-19: 500 mg via INTRAVENOUS
  Filled 2023-05-19: qty 25

## 2023-05-19 MED ORDER — PALONOSETRON HCL INJECTION 0.25 MG/5ML
0.2500 mg | Freq: Once | INTRAVENOUS | Status: AC
  Administered 2023-05-19: 0.25 mg via INTRAVENOUS
  Filled 2023-05-19: qty 5

## 2023-05-19 MED ORDER — DOXORUBICIN HCL CHEMO IV INJECTION 2 MG/ML
25.0000 mg/m2 | Freq: Once | INTRAVENOUS | Status: AC
Start: 2023-05-19 — End: 2023-05-19
  Administered 2023-05-19: 42 mg via INTRAVENOUS
  Filled 2023-05-19: qty 21

## 2023-05-19 NOTE — Telephone Encounter (Signed)
Patient notified of date, time, prep for PET scan at [facility].

## 2023-05-19 NOTE — Patient Instructions (Signed)
[facility]   Discharge Instructions: Thank you for choosing [facility] to provide your oncology and hematology care.   If you have a lab appointment with the Cancer Center, please go directly to the Cancer Center and check in at the registration area.   Wear comfortable clothing and clothing appropriate for easy access to any Portacath or PICC line.   We strive to give you quality time with your provider. You may need to reschedule your appointment if you arrive late (15 or more minutes).  Arriving late affects you and other patients whose appointments are after yours.  Also, if you miss three or more appointments without notifying the office, you may be dismissed from the clinic at the provider's discretion.      For prescription refill requests, have your pharmacy contact our office and allow 72 hours for refills to be completed.    Today you received the following chemotherapy and/or immunotherapy agents Doxorubicin/Cytoxan/Ruxience   To help prevent nausea and vomiting after your treatment, we encourage you to take your nausea medication as directed.  BELOW ARE SYMPTOMS THAT SHOULD BE REPORTED IMMEDIATELY: *FEVER GREATER THAN 100.4 F (38 C) OR HIGHER *CHILLS OR SWEATING *NAUSEA AND VOMITING THAT IS NOT CONTROLLED WITH YOUR NAUSEA MEDICATION *UNUSUAL SHORTNESS OF BREATH *UNUSUAL BRUISING OR BLEEDING *URINARY PROBLEMS (pain or burning when urinating, or frequent urination) *BOWEL PROBLEMS (unusual diarrhea, constipation, pain near the anus) TENDERNESS IN MOUTH AND THROAT WITH OR WITHOUT PRESENCE OF ULCERS (sore throat, sores in mouth, or a toothache) UNUSUAL RASH, SWELLING OR PAIN  UNUSUAL VAGINAL DISCHARGE OR ITCHING   Items with * indicate a potential emergency and should be followed up as soon as possible or go to the Emergency Department if any problems should occur.  Please show the CHEMOTHERAPY ALERT CARD or IMMUNOTHERAPY  ALERT CARD at check-in to the Emergency Department and triage nurse.  Should you have questions after your visit or need to cancel or reschedule your appointment, please contact [facility]  Dept: [phone]  and follow the prompts.  Office hours are 8:00 a.m. to 4:30 p.m. Monday - Friday. Please note that voicemails left after 4:00 p.m. may not be returned until the following business day.  We are closed weekends and major holidays. You have access to a nurse at all times for urgent questions. Please call the main number to the clinic Dept: [phone] and follow the prompts.   For any non-urgent questions, you may also contact your provider using MyChart. We now offer e-Visits for anyone 84 and older to request care online for non-urgent symptoms. For details visit [email or web address].   Also download the MyChart app! Go to the app store, search "MyChart", open the app, select [facility], and log in with your MyChart username and password.  Doxorubicin Injection What is this medication? DOXORUBICIN (dox oh ROO bi sin) treats some types of cancer. It works by slowing down the growth of cancer cells. This medicine may be used for other purposes; ask your health care provider or pharmacist if you have questions. COMMON BRAND NAME(S): Adriamycin, Adriamycin PFS, Adriamycin RDF, Rubex What should I tell my care team before I take this medication? They need to know if you have any of these conditions: Heart disease History of low blood cell levels caused by a medication Liver disease Recent or ongoing radiation An unusual or allergic reaction to doxorubicin, other medications, foods, dyes, or preservatives If  you or your partner are pregnant or trying to get pregnant Breast-feeding How should I use this medication? This medication is injected into a vein. It is given by your care team in a hospital or clinic setting. Talk to your care team about the use  of this medication in children. Special care may be needed. Overdosage: If you think you have taken too much of this medicine contact a poison control center or emergency room at once. NOTE: This medicine is only for you. Do not share this medicine with others. What if I miss a dose? Keep appointments for follow-up doses. It is important not to miss your dose. Call your care team if you are unable to keep an appointment. What may interact with this medication? 6-mercaptopurine Paclitaxel Phenytoin St. John's wort Trastuzumab Verapamil This list may not describe all possible interactions. Give your health care provider a list of all the medicines, herbs, non-prescription drugs, or dietary supplements you use. Also tell them if you smoke, drink alcohol, or use illegal drugs. Some items may interact with your medicine. What should I watch for while using this medication? Your condition will be monitored carefully while you are receiving this medication. You may need blood work while taking this medication. This medication may make you feel generally unwell. This is not uncommon as chemotherapy can affect healthy cells as well as cancer cells. Report any side effects. Continue your course of treatment even though you feel ill unless your care team tells you to stop. There is a maximum amount of this medication you should receive throughout your life. The amount depends on the medical condition being treated and your overall health. Your care team will watch how much of this medication you receive. Tell your care team if you have taken this medication before. Your urine may turn red for a few days after your dose. This is not blood. If your urine is dark or brown, call your care team. In some cases, you may be given additional medications to help with side effects. Follow all directions for their use. This medication may increase your risk of getting an infection. Call your care team for advice if you  get a fever, chills, sore throat, or other symptoms of a cold or flu. Do not treat yourself. Try to avoid being around people who are sick. This medication may increase your risk to bruise or bleed. Call your care team if you notice any unusual bleeding. Talk to your care team about your risk of cancer. You may be more at risk for certain types of cancers if you take this medication. Talk to your care team if you or your partner may be pregnant. Serious birth defects can occur if you take this medication during pregnancy and for 6 months after the last dose. Contraception is recommended while taking this medication and for 6 months after the last dose. Your care team can help you find the option that works for you. If your partner can get pregnant, use a condom while taking this medication and for 6 months after the last dose. Do not breastfeed while taking this medication. This medication may cause infertility. Talk to your care team if you are concerned about your fertility. What side effects may I notice from receiving this medication? Side effects that you should report to your care team as soon as possible: Allergic reactions--skin rash, itching, hives, swelling of the face, lips, tongue, or throat Heart failure--shortness of breath, swelling of the ankles, feet,  or hands, sudden weight gain, unusual weakness or fatigue Heart rhythm changes--fast or irregular heartbeat, dizziness, feeling faint or lightheaded, chest pain, trouble breathing Infection--fever, chills, cough, sore throat, wounds that don't heal, pain or trouble when passing urine, general feeling of discomfort or being unwell Low red blood cell level--unusual weakness or fatigue, dizziness, headache, trouble breathing Painful swelling, warmth, or redness of the skin, blisters or sores at the infusion site Unusual bruising or bleeding Side effects that usually do not require medical attention (report to your care team if they  continue or are bothersome): Diarrhea Hair loss Nausea Pain, redness, or swelling with sores inside the mouth or throat Red urine This list may not describe all possible side effects. Call your doctor for medical advice about side effects. You may report side effects to FDA at 1-800-FDA-1088. Where should I keep my medication? This medication is given in a hospital or clinic. It will not be stored at home. NOTE: This sheet is a summary. It may not cover all possible information. If you have questions about this medicine, talk to your doctor, pharmacist, or health care provider.  2024 Elsevier/Gold Standard (2022-10-10 00:00:00)  Cyclophosphamide Injection What is this medication? CYCLOPHOSPHAMIDE (sye kloe FOSS fa mide) treats some types of cancer. It works by slowing down the growth of cancer cells. This medicine may be used for other purposes; ask your health care provider or pharmacist if you have questions. COMMON BRAND NAME(S): Cyclophosphamide, Cytoxan, Neosar What should I tell my care team before I take this medication? They need to know if you have any of these conditions: Heart disease Irregular heartbeat or rhythm Infection Kidney problems Liver disease Low blood cell levels (white cells, platelets, or red blood cells) Lung disease Previous radiation Trouble passing urine An unusual or allergic reaction to cyclophosphamide, other medications, foods, dyes, or preservatives Pregnant or trying to get pregnant Breast-feeding How should I use this medication? This medication is injected into a vein. It is given by your care team in a hospital or clinic setting. Talk to your care team about the use of this medication in children. Special care may be needed. Overdosage: If you think you have taken too much of this medicine contact a poison control center or emergency room at once. NOTE: This medicine is only for you. Do not share this medicine with others. What if I miss a  dose? Keep appointments for follow-up doses. It is important not to miss your dose. Call your care team if you are unable to keep an appointment. What may interact with this medication? Amphotericin B Amiodarone Azathioprine Certain antivirals for HIV or hepatitis Certain medications for blood pressure, such as enalapril, lisinopril, quinapril Cyclosporine Diuretics Etanercept Indomethacin Medications that relax muscles Metronidazole Natalizumab Tamoxifen Warfarin This list may not describe all possible interactions. Give your health care provider a list of all the medicines, herbs, non-prescription drugs, or dietary supplements you use. Also tell them if you smoke, drink alcohol, or use illegal drugs. Some items may interact with your medicine. What should I watch for while using this medication? This medication may make you feel generally unwell. This is not uncommon as chemotherapy can affect healthy cells as well as cancer cells. Report any side effects. Continue your course of treatment even though you feel ill unless your care team tells you to stop. You may need blood work while you are taking this medication. This medication may increase your risk of getting an infection. Call your care team for  advice if you get a fever, chills, sore throat, or other symptoms of a cold or flu. Do not treat yourself. Try to avoid being around people who are sick. Avoid taking medications that contain aspirin, acetaminophen, ibuprofen, naproxen, or ketoprofen unless instructed by your care team. These medications may hide a fever. Be careful brushing or flossing your teeth or using a toothpick because you may get an infection or bleed more easily. If you have any dental work done, tell your dentist you are receiving this medication. Drink water or other fluids as directed. Urinate often, even at night. Some products may contain alcohol. Ask your care team if this medication contains alcohol. Be sure  to tell all care teams you are taking this medicine. Certain medicines, like metronidazole and disulfiram, can cause an unpleasant reaction when taken with alcohol. The reaction includes flushing, headache, nausea, vomiting, sweating, and increased thirst. The reaction can last from 30 minutes to several hours. Talk to your care team if you wish to become pregnant or think you might be pregnant. This medication can cause serious birth defects if taken during pregnancy and for 1 year after the last dose. A negative pregnancy test is required before starting this medication. A reliable form of contraception is recommended while taking this medication and for 1 year after the last dose. Talk to your care team about reliable forms of contraception. Do not father a child while taking this medication and for 4 months after the last dose. Use a condom during this time period. Do not breast-feed while taking this medication or for 1 week after the last dose. This medication may cause infertility. Talk to your care team if you are concerned about your fertility. Talk to your care team about your risk of cancer. You may be more at risk for certain types of cancer if you take this medication. What side effects may I notice from receiving this medication? Side effects that you should report to your care team as soon as possible: Allergic reactions--skin rash, itching, hives, swelling of the face, lips, tongue, or throat Dry cough, shortness of breath or trouble breathing Heart failure--shortness of breath, swelling of the ankles, feet, or hands, sudden weight gain, unusual weakness or fatigue Heart muscle inflammation--unusual weakness or fatigue, shortness of breath, chest pain, fast or irregular heartbeat, dizziness, swelling of the ankles, feet, or hands Heart rhythm changes--fast or irregular heartbeat, dizziness, feeling faint or lightheaded, chest pain, trouble breathing Infection--fever, chills, cough, sore  throat, wounds that don't heal, pain or trouble when passing urine, general feeling of discomfort or being unwell Kidney injury--decrease in the amount of urine, swelling of the ankles, hands, or feet Liver injury--right upper belly pain, loss of appetite, nausea, light-colored stool, dark yellow or brown urine, yellowing skin or eyes, unusual weakness or fatigue Low red blood cell level--unusual weakness or fatigue, dizziness, headache, trouble breathing Low sodium level--muscle weakness, fatigue, dizziness, headache, confusion Red or dark brown urine Unusual bruising or bleeding Side effects that usually do not require medical attention (report to your care team if they continue or are bothersome): Hair loss Irregular menstrual cycles or spotting Loss of appetite Nausea Pain, redness, or swelling with sores inside the mouth or throat Vomiting This list may not describe all possible side effects. Call your doctor for medical advice about side effects. You may report side effects to FDA at 1-800-FDA-1088. Where should I keep my medication? This medication is given in a hospital or clinic. It will not  be stored at home. NOTE: This sheet is a summary. It may not cover all possible information. If you have questions about this medicine, talk to your doctor, pharmacist, or health care provider.  2024 Elsevier/Gold Standard (2021-11-23 00:00:00)  Rituximab Injection What is this medication? RITUXIMAB (ri TUX i mab) treats leukemia and lymphoma. It works by blocking a protein that causes cancer cells to grow and multiply. This helps to slow or stop the spread of cancer cells. It may also be used to treat autoimmune conditions, such as arthritis. It works by slowing down an overactive immune system. It is a monoclonal antibody. This medicine may be used for other purposes; ask your health care provider or pharmacist if you have questions. COMMON BRAND NAME(S): RIABNI, Rituxan, RUXIENCE,  truxima What should I tell my care team before I take this medication? They need to know if you have any of these conditions: Chest pain Heart disease Immune system problems Infection, such as chickenpox, cold sores, hepatitis B, herpes Irregular heartbeat or rhythm Kidney disease Low blood counts, such as low white cells, platelets, red cells Lung disease Recent or upcoming vaccine An unusual or allergic reaction to rituximab, other medications, foods, dyes, or preservatives Pregnant or trying to get pregnant Breast-feeding How should I use this medication? This medication is injected into a vein. It is given by a care team in a hospital or clinic setting. A special MedGuide will be given to you before each treatment. Be sure to read this information carefully each time. Talk to your care team about the use of this medication in children. While this medication may be prescribed for children as young as 6 months for selected conditions, precautions do apply. Overdosage: If you think you have taken too much of this medicine contact a poison control center or emergency room at once. NOTE: This medicine is only for you. Do not share this medicine with others. What if I miss a dose? Keep appointments for follow-up doses. It is important not to miss your dose. Call your care team if you are unable to keep an appointment. What may interact with this medication? Do not take this medication with any of the following: Live vaccines This medication may also interact with the following: Cisplatin This list may not describe all possible interactions. Give your health care provider a list of all the medicines, herbs, non-prescription drugs, or dietary supplements you use. Also tell them if you smoke, drink alcohol, or use illegal drugs. Some items may interact with your medicine. What should I watch for while using this medication? Your condition will be monitored carefully while you are receiving  this medication. You may need blood work while taking this medication. This medication can cause serious infusion reactions. To reduce the risk your care team may give you other medications to take before receiving this one. Be sure to follow the directions from your care team. This medication may increase your risk of getting an infection. Call your care team for advice if you get a fever, chills, sore throat, or other symptoms of a cold or flu. Do not treat yourself. Try to avoid being around people who are sick. Call your care team if you are around anyone with measles, chickenpox, or if you develop sores or blisters that do not heal properly. Avoid taking medications that contain aspirin, acetaminophen, ibuprofen, naproxen, or ketoprofen unless instructed by your care team. These medications may hide a fever. This medication may cause serious skin reactions. They can  happen weeks to months after starting the medication. Contact your care team right away if you notice fevers or flu-like symptoms with a rash. The rash may be red or purple and then turn into blisters or peeling of the skin. You may also notice a red rash with swelling of the face, lips, or lymph nodes in your neck or under your arms. In some patients, this medication may cause a serious brain infection that may cause death. If you have any problems seeing, thinking, speaking, walking, or standing, tell your care team right away. If you cannot reach your care team, urgently seek another source of medical care. Talk to your care team if you may be pregnant. Serious birth defects can occur if you take this medication during pregnancy and for 12 months after the last dose. You will need a negative pregnancy test before starting this medication. Contraception is recommended while taking this medication and for 12 months after the last dose. Your care team can help you find the option that works for you. Do not breastfeed while taking this  medication and for at least 6 months after the last dose. What side effects may I notice from receiving this medication? Side effects that you should report to your care team as soon as possible: Allergic reactions or angioedema--skin rash, itching or hives, swelling of the face, eyes, lips, tongue, arms, or legs, trouble swallowing or breathing Bowel blockage--stomach cramping, unable to have a bowel movement or pass gas, loss of appetite, vomiting Dizziness, loss of balance or coordination, confusion or trouble speaking Heart attack--pain or tightness in the chest, shoulders, arms, or jaw, nausea, shortness of breath, cold or clammy skin, feeling faint or lightheaded Heart rhythm changes--fast or irregular heartbeat, dizziness, feeling faint or lightheaded, chest pain, trouble breathing Infection--fever, chills, cough, sore throat, wounds that don't heal, pain or trouble when passing urine, general feeling of discomfort or being unwell Infusion reactions--chest pain, shortness of breath or trouble breathing, feeling faint or lightheaded Kidney injury--decrease in the amount of urine, swelling of the ankles, hands, or feet Liver injury--right upper belly pain, loss of appetite, nausea, light-colored stool, dark yellow or brown urine, yellowing skin or eyes, unusual weakness or fatigue Redness, blistering, peeling, or loosening of the skin, including inside the mouth Stomach pain that is severe, does not go away, or gets worse Tumor lysis syndrome (TLS)--nausea, vomiting, diarrhea, decrease in the amount of urine, dark urine, unusual weakness or fatigue, confusion, muscle pain or cramps, fast or irregular heartbeat, joint pain Side effects that usually do not require medical attention (report to your care team if they continue or are bothersome): Headache Joint pain Nausea Runny or stuffy nose Unusual weakness or fatigue This list may not describe all possible side effects. Call your doctor for  medical advice about side effects. You may report side effects to FDA at 1-800-FDA-1088. Where should I keep my medication? This medication is given in a hospital or clinic. It will not be stored at home. NOTE: This sheet is a summary. It may not cover all possible information. If you have questions about this medicine, talk to your doctor, pharmacist, or health care provider.  2024 Elsevier/Gold Standard (2021-11-29 00:00:00)

## 2023-05-19 NOTE — Progress Notes (Signed)
Patient seen by Dr. Thornton Papas today  Vitals are within treatment parameters:Yes   Labs are within treatment parameters: Yes   Treatment plan has been signed: Yes   Per physician team, Patient is ready for treatment. Please note the following modifications: Vincristine being removed due to neuropathy (please do not release that drug). MD has not removed yet.

## 2023-05-19 NOTE — Progress Notes (Signed)
[facility] OFFICE PROGRESS NOTE   Diagnosis: Non-Hodgkin's lymphoma  INTERVAL HISTORY:   Amber Park completed another cycle of R-mini CHOP beginning 04/28/2023.  She reports malaise over the 2 weeks following chemotherapy.  No nausea/vomiting.  She had a mouth sore at the palate.  She has tingling in the feet, chiefly at night.  Pain at the left chest wall has resolved.  Objective:  Vital signs in last 24 hours:  Blood pressure (!) 148/63, pulse 65, temperature 98.1 F (36.7 C), temperature source Oral, resp. rate 18, height 5\' 2"  (1.575 m), weight 142 lb 11.2 oz (64.7 kg), SpO2 98%.    HEENT: No thrush or ulcers Lymphatics: No left axillary nodes or chest wall mass Resp: Lungs clear bilaterally Cardio: Regular rate and rhythm GI: No hepatosplenomegaly Vascular: No leg edema Neuro: The deep tendon reflexes appear diminished at the biceps and knees Skin: Palms without erythema  Portacath/PICC-without erythema  Lab Results:  Lab Results  Component Value Date   WBC 4.7 05/19/2023   HGB 11.9 (L) 05/19/2023   HCT 36.5 05/19/2023   MCV 84.3 05/19/2023   PLT 312 05/19/2023   NEUTROABS 2.7 05/19/2023    CMP  Lab Results  Component Value Date   NA 137 04/28/2023   K 4.4 04/28/2023   CL 103 04/28/2023   CO2 25 04/28/2023   GLUCOSE 88 04/28/2023   BUN 16 04/28/2023   CREATININE 1.11 (H) 04/28/2023   CALCIUM 9.0 04/28/2023   PROT 6.4 (L) 04/28/2023   ALBUMIN 4.0 04/28/2023   AST 19 04/28/2023   ALT 9 04/28/2023   ALKPHOS 72 04/28/2023   BILITOT 0.4 04/28/2023   GFRNONAA 45 (L) 04/28/2023   GFRAA 60 (L) 02/13/2020    Lab Results  Component Value Date   CEA1 1.78 12/04/2020   CEA 1.13 01/13/2023     Medications: I have reviewed the patient's current medications.   Assessment/Plan: Colon cancer-stage IIIb Cecal mass on colonoscopy 01/03/2020, biopsy confirmed adenocarcinoma CTs 01/11/2020-cecal mass, prominent subcentimeter mesenteric nodes  adjacent to the cecum, multiple tiny pulmonary nodules-metastases not favored Laparoscopic right colectomy 02/10/2020,pT3pN1a, grade 2-grade 3, 1/27 lymph nodes, 1 tumor deposit, MSI-high, loss of MLH1 and PMS 2 expression; MLH1 methylation detected CTs 02/05/2021-no evidence of recurrent disease, stable tiny upper lobe pulmonary nodules considered benign CTs 02/12/2022-nonspecific lymphadenopathy in the mesentery with mild mesenteric stranding, largest nodes measure up to 8 mm with no evidence of metastatic disease in the chest CTs 01/13/2023-new pleural-based masses in the lateral left upper chest with destruction of the left anterior second rib, no other evidence of metastatic disease CT-guided biopsy of left chest wall mass 02/21/2023-B-cell lymphoma with a high Ki-67, Bcl-2 positive, negative for double hit or triple hit lymphoma, large B-cell lymphoma per communication with the pathologist PET 03/11/2023-intense hypermetabolic activity associated with left chest wall mass, no evidence of additional sites of lymphoma, no evidence of recurrent colon cancer Cycle 1R-mini CHOP 03/14/2023 Cycle 2R-mini CHOP 04/07/2023 Cycle 3R-mini CHOP 04/28/2023 Cycle 4R-mini CHOP 05/11/2023, vincristine held secondary to neuropathy symptoms 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 and history of AVMs/Cameron's erosions Ferrous gluconate 07/24/2020-allergic/infusion reaction with chest and back pain, diarrhea, nausea/vomiting, chills, presyncope, hypotension Feraheme 08/14/2020 and 08/21/2020-tolerated well Hiatal hernia Family history of colon and bladder cancer Transverse colon tubular adenoma on the colonoscopy 01/03/2020 Hypertension Glaucoma Admission 02/12/2022 with choledocholithiasis, status post ERCP and stone removal Left chest wall masses on CT 01/13/2023-CT-guided biopsy of left chest wall mass 02/21/2023 Hypercalcemia 03/07/2023-Zometa and  IV fluids Echocardiogram 03/10/2023-LVEF 65%, normal left and right ventricular  function    Disposition: Amber Park has completed 3 cycles of R-mini CHOP.  She has tolerated the chemotherapy well.  She will complete cycle 4 today.  Vincristine will be held secondary to neuropathy symptoms.  She will undergo restaging PET prior to an office visit in 3 weeks.  She will try Magic mouthwash if she develops recurrent mucositis.  Thornton Papas, MD  05/19/2023  8:32 AM

## 2023-05-21 ENCOUNTER — Inpatient Hospital Stay: Payer: Medicare Other

## 2023-05-30 ENCOUNTER — Other Ambulatory Visit: Payer: Self-pay | Admitting: Gastroenterology

## 2023-06-05 ENCOUNTER — Encounter (HOSPITAL_COMMUNITY)
Admission: RE | Admit: 2023-06-05 | Discharge: 2023-06-05 | Disposition: A | Discharge status: Home / Self Care | Payer: Medicare Other | Source: Ambulatory Visit | Attending: Oncology | Admitting: Oncology

## 2023-06-05 DIAGNOSIS — C8580 Other specified types of non-Hodgkin lymphoma, unspecified site: Secondary | ICD-10-CM | POA: Insufficient documentation

## 2023-06-05 DIAGNOSIS — I700 Atherosclerosis of aorta: Secondary | ICD-10-CM | POA: Insufficient documentation

## 2023-06-05 DIAGNOSIS — C8590 Non-Hodgkin lymphoma, unspecified, unspecified site: Secondary | ICD-10-CM | POA: Diagnosis not present

## 2023-06-05 LAB — GLUCOSE, CAPILLARY: Glucose-Capillary: 87 mg/dL (ref 70–99)

## 2023-06-05 MED ORDER — FLUDEOXYGLUCOSE F - 18 (FDG) INJECTION
7.1000 | Freq: Once | INTRAVENOUS | Status: AC
  Administered 2023-06-05: 7.1 via INTRAVENOUS

## 2023-06-07 ENCOUNTER — Other Ambulatory Visit: Payer: Self-pay | Admitting: Oncology

## 2023-06-07 DIAGNOSIS — C8580 Other specified types of non-Hodgkin lymphoma, unspecified site: Secondary | ICD-10-CM

## 2023-06-09 ENCOUNTER — Inpatient Hospital Stay: Payer: Medicare Other | Attending: Nurse Practitioner

## 2023-06-09 ENCOUNTER — Inpatient Hospital Stay: Payer: Medicare Other | Admitting: Oncology

## 2023-06-09 ENCOUNTER — Inpatient Hospital Stay: Payer: Medicare Other

## 2023-06-09 VITALS — BP 147/54 | HR 68 | Temp 97.8°F | Resp 18 | Ht 62.0 in | Wt 142.8 lb

## 2023-06-09 DIAGNOSIS — Z95828 Presence of other vascular implants and grafts: Secondary | ICD-10-CM

## 2023-06-09 DIAGNOSIS — C189 Malignant neoplasm of colon, unspecified: Secondary | ICD-10-CM | POA: Insufficient documentation

## 2023-06-09 DIAGNOSIS — D509 Iron deficiency anemia, unspecified: Secondary | ICD-10-CM | POA: Insufficient documentation

## 2023-06-09 DIAGNOSIS — Z5112 Encounter for antineoplastic immunotherapy: Secondary | ICD-10-CM | POA: Insufficient documentation

## 2023-06-09 DIAGNOSIS — C8580 Other specified types of non-Hodgkin lymphoma, unspecified site: Secondary | ICD-10-CM

## 2023-06-09 DIAGNOSIS — C8589 Other specified types of non-Hodgkin lymphoma, extranodal and solid organ sites: Secondary | ICD-10-CM | POA: Insufficient documentation

## 2023-06-09 DIAGNOSIS — Z79899 Other long term (current) drug therapy: Secondary | ICD-10-CM | POA: Insufficient documentation

## 2023-06-09 DIAGNOSIS — Z5111 Encounter for antineoplastic chemotherapy: Secondary | ICD-10-CM | POA: Insufficient documentation

## 2023-06-09 DIAGNOSIS — C8590 Non-Hodgkin lymphoma, unspecified, unspecified site: Secondary | ICD-10-CM | POA: Insufficient documentation

## 2023-06-09 LAB — CBC WITH DIFFERENTIAL (CANCER CENTER ONLY)
Abs Immature Granulocytes: 0.01 10*3/uL (ref 0.00–0.07)
Basophils Absolute: 0.1 10*3/uL (ref 0.0–0.1)
Basophils Relative: 2 %
Eosinophils Absolute: 0.1 10*3/uL (ref 0.0–0.5)
Eosinophils Relative: 2 %
HCT: 35.5 % — ABNORMAL LOW (ref 36.0–46.0)
Hemoglobin: 12 g/dL (ref 12.0–15.0)
Immature Granulocytes: 0 %
Lymphocytes Relative: 41 %
Lymphs Abs: 1.6 10*3/uL (ref 0.7–4.0)
MCH: 28.2 pg (ref 26.0–34.0)
MCHC: 33.8 g/dL (ref 30.0–36.0)
MCV: 83.3 fL (ref 80.0–100.0)
Monocytes Absolute: 1 10*3/uL (ref 0.1–1.0)
Monocytes Relative: 25 %
Neutro Abs: 1.2 10*3/uL — ABNORMAL LOW (ref 1.7–7.7)
Neutrophils Relative %: 30 %
Platelet Count: 223 10*3/uL (ref 150–400)
RBC: 4.26 MIL/uL (ref 3.87–5.11)
RDW: 15 % (ref 11.5–15.5)
WBC Count: 4 10*3/uL (ref 4.0–10.5)
nRBC: 0 % (ref 0.0–0.2)

## 2023-06-09 LAB — CMP (CANCER CENTER ONLY)
ALT: 11 U/L (ref 0–44)
AST: 24 U/L (ref 15–41)
Albumin: 4.1 g/dL (ref 3.5–5.0)
Alkaline Phosphatase: 61 U/L (ref 38–126)
Anion gap: 8 (ref 5–15)
BUN: 18 mg/dL (ref 8–23)
CO2: 25 mmol/L (ref 22–32)
Calcium: 9 mg/dL (ref 8.9–10.3)
Chloride: 104 mmol/L (ref 98–111)
Creatinine: 1.14 mg/dL — ABNORMAL HIGH (ref 0.44–1.00)
GFR, Estimated: 44 mL/min — ABNORMAL LOW (ref >60)
Glucose, Bld: 84 mg/dL (ref 70–99)
Potassium: 4.3 mmol/L (ref 3.5–5.1)
Sodium: 137 mmol/L (ref 135–145)
Total Bilirubin: 0.4 mg/dL (ref <1.2)
Total Protein: 6.5 g/dL (ref 6.5–8.1)

## 2023-06-09 MED ORDER — SODIUM CHLORIDE 0.9% FLUSH
10.0000 mL | INTRAVENOUS | Status: DC | PRN
  Administered 2023-06-09: 10 mL via INTRAVENOUS

## 2023-06-09 MED ORDER — HEPARIN SOD (PORK) LOCK FLUSH 100 UNIT/ML IV SOLN
500.0000 [IU] | Freq: Once | INTRAVENOUS | Status: AC
  Administered 2023-06-09: 500 [IU] via INTRAVENOUS

## 2023-06-09 NOTE — Progress Notes (Signed)
Patient seen by Dr. Thornton Papas today  Vitals are within treatment parameters:Yes   Labs are within treatment parameters: No (Please specify and give further instructions.) ANC 1.2  Treatment plan has been signed:  NA    Per physician team, Patient will not be receiving treatment today.  Will reschedule for next week and add gcsf at that time. Collaborative RN will de-access

## 2023-06-09 NOTE — Progress Notes (Signed)
[facility] OFFICE PROGRESS NOTE   Diagnosis: Non-Hodgkin's lymphoma  INTERVAL HISTORY:   Amber Park completed another cycle of R-CHOP on 05/11/2023.  She did not develop mouth sores following this cycle of chemotherapy.  No neuropathy symptoms.  She feels well.  The left chest/axillary comfort is no longer present.  Objective:  Vital signs in last 24 hours:  Blood pressure (!) 147/54, pulse 68, temperature 97.8 F (36.6 C), temperature source Temporal, resp. rate 18, height 5\' 2"  (1.575 m), weight 142 lb 12.8 oz (64.8 kg), SpO2 99%.    HEENT: No thrush or ulcers Lymphatics: No cervical, supraclavicular, or axillary nodes Resp: Lungs clear bilaterally Cardio: Regular rate and rhythm GI: No hepatosplenomegaly, nontender Vascular: No leg edema    Portacath/PICC-without erythema  Lab Results:  Lab Results  Component Value Date   WBC 4.0 06/09/2023   HGB 12.0 06/09/2023   HCT 35.5 (L) 06/09/2023   MCV 83.3 06/09/2023   PLT 223 06/09/2023   NEUTROABS 1.2 (L) 06/09/2023    CMP  Lab Results  Component Value Date   NA 137 06/09/2023   K 4.3 06/09/2023   CL 104 06/09/2023   CO2 25 06/09/2023   GLUCOSE 84 06/09/2023   BUN 18 06/09/2023   CREATININE 1.14 (H) 06/09/2023   CALCIUM 9.0 06/09/2023   PROT 6.5 06/09/2023   ALBUMIN 4.1 06/09/2023   AST 24 06/09/2023   ALT 11 06/09/2023   ALKPHOS 61 06/09/2023   BILITOT 0.4 06/09/2023   GFRNONAA 44 (L) 06/09/2023   GFRAA 60 (L) 02/13/2020    Lab Results  Component Value Date   CEA1 1.78 12/04/2020   CEA 1.13 01/13/2023    Lab Results  Component Value Date   INR 1.0 02/21/2023   LABPROT 13.0 02/21/2023    Imaging:  No results found.  Medications: I have reviewed the patient's current medications.   Assessment/Plan: Colon cancer-stage IIIb Cecal mass on colonoscopy 01/03/2020, biopsy confirmed adenocarcinoma CTs 01/11/2020-cecal mass, prominent subcentimeter mesenteric nodes adjacent to the  cecum, multiple tiny pulmonary nodules-metastases not favored Laparoscopic right colectomy 02/10/2020,pT3pN1a, grade 2-grade 3, 1/27 lymph nodes, 1 tumor deposit, MSI-high, loss of MLH1 and PMS 2 expression; MLH1 methylation detected CTs 02/05/2021-no evidence of recurrent disease, stable tiny upper lobe pulmonary nodules considered benign CTs 02/12/2022-nonspecific lymphadenopathy in the mesentery with mild mesenteric stranding, largest nodes measure up to 8 mm with no evidence of metastatic disease in the chest CTs 01/13/2023-new pleural-based masses in the lateral left upper chest with destruction of the left anterior second rib, no other evidence of metastatic disease CT-guided biopsy of left chest wall mass 02/21/2023-B-cell lymphoma with a high Ki-67, Bcl-2 positive, negative for double hit or triple hit lymphoma, large B-cell lymphoma per communication with the pathologist PET 03/11/2023-intense hypermetabolic activity associated with left chest wall mass, no evidence of additional sites of lymphoma, no evidence of recurrent colon cancer Cycle 1R-mini CHOP 03/14/2023 Cycle 2R-mini CHOP 04/07/2023 Cycle 3R-mini CHOP 04/28/2023 Cycle 4R-mini CHOP 05/11/2023, vincristine held secondary to neuropathy symptoms PET 06/05/2023-no residual left chest wall mass, remaining metabolic activity at the superior lateral left chest wall SUV 4.0 (previously 30.1), [place] 3 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 and history of AVMs/Cameron's erosions Ferrous gluconate 07/24/2020-allergic/infusion reaction with chest and back pain, diarrhea, nausea/vomiting, chills, presyncope, hypotension Feraheme 08/14/2020 and 08/21/2020-tolerated well Hiatal hernia Family history of colon and bladder cancer Transverse colon tubular adenoma on the colonoscopy 01/03/2020 Hypertension Glaucoma Admission 02/12/2022 with choledocholithiasis, status post ERCP and  stone removal Left chest wall masses on CT 01/13/2023-CT-guided biopsy of  left chest wall mass 02/21/2023 Hypercalcemia 03/07/2023-Zometa and IV fluids Echocardiogram 03/10/2023-LVEF 65%, normal left and right ventricular function    Disposition: Amber Park has completed 4 cycles of R mini CHOP.  She has tolerated the treatment well.  I reviewed the restaging PET findings and images with her.  The left chest wall mass has resolved.  The PET is consistent with a complete clinical response.  I recommend completing 2 more cycles of R mini CHOP.  Vincristine will be held secondary to neuropathy symptoms reported prior to the last cycle of chemotherapy.  She has mild neutropenia today.  We discussed the risk/benefit of proceeding with treatment.  I recommend holding treatment 1 week.  She will receive G-CSF support with a neck cycle of chemotherapy.  We reviewed potential toxicities associated with G-CSF including the chance of rash, bone pain, and splenic rupture.  She agrees to proceed.  She will return for cycle 5R mini CHOP in 1 week.  Thornton Papas, MD  06/09/2023  8:59 AM

## 2023-06-11 ENCOUNTER — Inpatient Hospital Stay: Payer: Medicare Other

## 2023-06-16 ENCOUNTER — Inpatient Hospital Stay: Payer: Medicare Other

## 2023-06-16 ENCOUNTER — Encounter: Payer: Self-pay | Admitting: Nurse Practitioner

## 2023-06-16 ENCOUNTER — Inpatient Hospital Stay: Payer: Medicare Other | Admitting: Nurse Practitioner

## 2023-06-16 VITALS — BP 130/47 | HR 64 | Temp 97.4°F | Resp 18

## 2023-06-16 VITALS — BP 137/55 | HR 55 | Temp 97.5°F | Resp 18 | Ht 62.0 in | Wt 144.2 lb

## 2023-06-16 DIAGNOSIS — Z5112 Encounter for antineoplastic immunotherapy: Secondary | ICD-10-CM | POA: Diagnosis not present

## 2023-06-16 DIAGNOSIS — C8580 Other specified types of non-Hodgkin lymphoma, unspecified site: Secondary | ICD-10-CM

## 2023-06-16 DIAGNOSIS — C189 Malignant neoplasm of colon, unspecified: Secondary | ICD-10-CM | POA: Diagnosis not present

## 2023-06-16 DIAGNOSIS — Z5111 Encounter for antineoplastic chemotherapy: Secondary | ICD-10-CM | POA: Diagnosis not present

## 2023-06-16 DIAGNOSIS — C8589 Other specified types of non-Hodgkin lymphoma, extranodal and solid organ sites: Secondary | ICD-10-CM | POA: Diagnosis not present

## 2023-06-16 DIAGNOSIS — Z79899 Other long term (current) drug therapy: Secondary | ICD-10-CM | POA: Diagnosis not present

## 2023-06-16 DIAGNOSIS — Z95828 Presence of other vascular implants and grafts: Secondary | ICD-10-CM

## 2023-06-16 DIAGNOSIS — D509 Iron deficiency anemia, unspecified: Secondary | ICD-10-CM | POA: Diagnosis not present

## 2023-06-16 DIAGNOSIS — C8590 Non-Hodgkin lymphoma, unspecified, unspecified site: Secondary | ICD-10-CM

## 2023-06-16 LAB — CBC WITH DIFFERENTIAL (CANCER CENTER ONLY)
Abs Immature Granulocytes: 0.04 10*3/uL (ref 0.00–0.07)
Basophils Absolute: 0.1 10*3/uL (ref 0.0–0.1)
Basophils Relative: 2 %
Eosinophils Absolute: 0.1 10*3/uL (ref 0.0–0.5)
Eosinophils Relative: 2 %
HCT: 36.9 % (ref 36.0–46.0)
Hemoglobin: 12.2 g/dL (ref 12.0–15.0)
Immature Granulocytes: 1 %
Lymphocytes Relative: 19 %
Lymphs Abs: 1 10*3/uL (ref 0.7–4.0)
MCH: 27.5 pg (ref 26.0–34.0)
MCHC: 33.1 g/dL (ref 30.0–36.0)
MCV: 83.1 fL (ref 80.0–100.0)
Monocytes Absolute: 1.1 10*3/uL — ABNORMAL HIGH (ref 0.1–1.0)
Monocytes Relative: 22 %
Neutro Abs: 2.8 10*3/uL (ref 1.7–7.7)
Neutrophils Relative %: 54 %
Platelet Count: 296 10*3/uL (ref 150–400)
RBC: 4.44 MIL/uL (ref 3.87–5.11)
RDW: 14.8 % (ref 11.5–15.5)
WBC Count: 5.2 10*3/uL (ref 4.0–10.5)
nRBC: 0 % (ref 0.0–0.2)

## 2023-06-16 LAB — CMP (CANCER CENTER ONLY)
ALT: 9 U/L (ref 0–44)
AST: 25 U/L (ref 15–41)
Albumin: 4 g/dL (ref 3.5–5.0)
Alkaline Phosphatase: 55 U/L (ref 38–126)
Anion gap: 8 (ref 5–15)
BUN: 18 mg/dL (ref 8–23)
CO2: 25 mmol/L (ref 22–32)
Calcium: 9.3 mg/dL (ref 8.9–10.3)
Chloride: 106 mmol/L (ref 98–111)
Creatinine: 1.1 mg/dL — ABNORMAL HIGH (ref 0.44–1.00)
GFR, Estimated: 46 mL/min — ABNORMAL LOW (ref >60)
Glucose, Bld: 87 mg/dL (ref 70–99)
Potassium: 4.3 mmol/L (ref 3.5–5.1)
Sodium: 139 mmol/L (ref 135–145)
Total Bilirubin: 0.4 mg/dL (ref <1.2)
Total Protein: 6.4 g/dL — ABNORMAL LOW (ref 6.5–8.1)

## 2023-06-16 MED ORDER — SODIUM CHLORIDE 0.9 % IV SOLN
300.0000 mg/m2 | Freq: Once | INTRAVENOUS | Status: AC
  Administered 2023-06-16: 500 mg via INTRAVENOUS
  Filled 2023-06-16: qty 25

## 2023-06-16 MED ORDER — HEPARIN SOD (PORK) LOCK FLUSH 100 UNIT/ML IV SOLN
500.0000 [IU] | Freq: Once | INTRAVENOUS | Status: AC | PRN
  Administered 2023-06-16: 500 [IU]

## 2023-06-16 MED ORDER — DOXORUBICIN HCL CHEMO IV INJECTION 2 MG/ML
25.0000 mg/m2 | Freq: Once | INTRAVENOUS | Status: AC
  Administered 2023-06-16: 42 mg via INTRAVENOUS
  Filled 2023-06-16: qty 21

## 2023-06-16 MED ORDER — SODIUM CHLORIDE 0.9 % IV SOLN
Freq: Once | INTRAVENOUS | Status: AC
Start: 2023-06-16 — End: 2023-06-16

## 2023-06-16 MED ORDER — SODIUM CHLORIDE 0.9% FLUSH
10.0000 mL | INTRAVENOUS | Status: DC | PRN
Start: 2023-06-16 — End: 2023-06-16
  Administered 2023-06-16: 10 mL

## 2023-06-16 MED ORDER — SODIUM CHLORIDE 0.9% FLUSH
10.0000 mL | INTRAVENOUS | Status: DC | PRN
  Administered 2023-06-16: 10 mL via INTRAVENOUS

## 2023-06-16 MED ORDER — SODIUM CHLORIDE 0.9 % IV SOLN
375.0000 mg/m2 | Freq: Once | INTRAVENOUS | Status: AC
  Administered 2023-06-16: 600 mg via INTRAVENOUS
  Filled 2023-06-16: qty 50

## 2023-06-16 MED ORDER — SODIUM CHLORIDE 0.9 % IV SOLN
150.0000 mg | Freq: Once | INTRAVENOUS | Status: AC
  Administered 2023-06-16: 150 mg via INTRAVENOUS
  Filled 2023-06-16: qty 150

## 2023-06-16 MED ORDER — DEXAMETHASONE SODIUM PHOSPHATE 10 MG/ML IJ SOLN
10.0000 mg | Freq: Once | INTRAMUSCULAR | Status: AC
  Administered 2023-06-16: 10 mg via INTRAVENOUS
  Filled 2023-06-16: qty 1

## 2023-06-16 MED ORDER — DIPHENHYDRAMINE HCL 25 MG PO CAPS
25.0000 mg | ORAL_CAPSULE | Freq: Once | ORAL | Status: AC
Start: 2023-06-16 — End: 2023-06-16
  Administered 2023-06-16: 25 mg via ORAL
  Filled 2023-06-16: qty 1

## 2023-06-16 MED ORDER — PALONOSETRON HCL INJECTION 0.25 MG/5ML
0.2500 mg | Freq: Once | INTRAVENOUS | Status: AC
  Administered 2023-06-16: 0.25 mg via INTRAVENOUS
  Filled 2023-06-16: qty 5

## 2023-06-16 MED ORDER — ACETAMINOPHEN 325 MG PO TABS
650.0000 mg | ORAL_TABLET | Freq: Once | ORAL | Status: AC
Start: 2023-06-16 — End: 2023-06-16
  Administered 2023-06-16: 650 mg via ORAL
  Filled 2023-06-16: qty 2

## 2023-06-16 NOTE — Progress Notes (Signed)
Patient seen by Lonna Cobb NP today  Vitals are within treatment parameters:Yes   Labs are within treatment parameters: Yes   Treatment plan has been signed: Yes   Per physician team, Patient is ready for treatment and there are NO modifications to the treatment plan.

## 2023-06-16 NOTE — Patient Instructions (Signed)
[facility] - A DEPT OF [facility]  Discharge Instructions: Thank you for choosing [facility] to provide your oncology and hematology care.   If you have a lab appointment with the Cancer Center, please go directly to the Cancer Center and check in at the registration area.   Wear comfortable clothing and clothing appropriate for easy access to any Portacath or PICC line.   We strive to give you quality time with your provider. You may need to reschedule your appointment if you arrive late (15 or more minutes).  Arriving late affects you and other patients whose appointments are after yours.  Also, if you miss three or more appointments without notifying the office, you may be dismissed from the clinic at the provider's discretion.      For prescription refill requests, have your pharmacy contact our office and allow 72 hours for refills to be completed.    Today you received the following chemotherapy and/or immunotherapy agents DOXOrubicine, cyclophosphamide, [id number]   To help prevent nausea and vomiting after your treatment, we encourage you to take your nausea medication as directed.  BELOW ARE SYMPTOMS THAT SHOULD BE REPORTED IMMEDIATELY: *FEVER GREATER THAN 100.4 F (38 C) OR HIGHER *CHILLS OR SWEATING *NAUSEA AND VOMITING THAT IS NOT CONTROLLED WITH YOUR NAUSEA MEDICATION *UNUSUAL SHORTNESS OF BREATH *UNUSUAL BRUISING OR BLEEDING *URINARY PROBLEMS (pain or burning when urinating, or frequent urination) *BOWEL PROBLEMS (unusual diarrhea, constipation, pain near the anus) TENDERNESS IN MOUTH AND THROAT WITH OR WITHOUT PRESENCE OF ULCERS (sore throat, sores in mouth, or a toothache) UNUSUAL RASH, SWELLING OR PAIN  UNUSUAL VAGINAL DISCHARGE OR ITCHING   Items with * indicate a potential emergency and should be followed up as soon as possible or go to the Emergency Department if any problems should occur.  Please show the  CHEMOTHERAPY ALERT CARD or IMMUNOTHERAPY ALERT CARD at check-in to the Emergency Department and triage nurse.  Should you have questions after your visit or need to cancel or reschedule your appointment, please contact [facility] - A DEPT OF [facility]  Dept: [phone]  and follow the prompts.  Office hours are 8:00 a.m. to 4:30 p.m. Monday - Friday. Please note that voicemails left after 4:00 p.m. may not be returned until the following business day.  We are closed weekends and major holidays. You have access to a nurse at all times for urgent questions. Please call the main number to the clinic Dept: [phone] and follow the prompts.   For any non-urgent questions, you may also contact your provider using MyChart. We now offer e-Visits for anyone 76 and older to request care online for non-urgent symptoms. For details visit [email or web address].   Also download the MyChart app! Go to the app store, search "MyChart", open the app, select , and log in with your MyChart username and password.  Doxorubicin Injection What is this medication? DOXORUBICIN (dox oh ROO bi sin) treats some types of cancer. It works by slowing down the growth of cancer cells. This medicine may be used for other purposes; ask your health care provider or pharmacist if you have questions. COMMON BRAND NAME(S): Adriamycin, Adriamycin PFS, Adriamycin RDF, Rubex What should I tell my care team before I take this medication? They need to know if you have any of these conditions: Heart disease History of low blood cell levels caused by a medication Liver disease Recent or ongoing radiation An  unusual or allergic reaction to doxorubicin, other medications, foods, dyes, or preservatives If you or your partner are pregnant or trying to get pregnant Breast-feeding How should I use this medication? This medication is injected into a vein. It is given by your care team in a  hospital or clinic setting. Talk to your care team about the use of this medication in children. Special care may be needed. Overdosage: If you think you have taken too much of this medicine contact a poison control center or emergency room at once. NOTE: This medicine is only for you. Do not share this medicine with others. What if I miss a dose? Keep appointments for follow-up doses. It is important not to miss your dose. Call your care team if you are unable to keep an appointment. What may interact with this medication? 6-mercaptopurine Paclitaxel Phenytoin St. John's wort Trastuzumab Verapamil This list may not describe all possible interactions. Give your health care provider a list of all the medicines, herbs, non-prescription drugs, or dietary supplements you use. Also tell them if you smoke, drink alcohol, or use illegal drugs. Some items may interact with your medicine. What should I watch for while using this medication? Your condition will be monitored carefully while you are receiving this medication. You may need blood work while taking this medication. This medication may make you feel generally unwell. This is not uncommon as chemotherapy can affect healthy cells as well as cancer cells. Report any side effects. Continue your course of treatment even though you feel ill unless your care team tells you to stop. There is a maximum amount of this medication you should receive throughout your life. The amount depends on the medical condition being treated and your overall health. Your care team will watch how much of this medication you receive. Tell your care team if you have taken this medication before. Your urine may turn red for a few days after your dose. This is not blood. If your urine is dark or brown, call your care team. In some cases, you may be given additional medications to help with side effects. Follow all directions for their use. This medication may increase your  risk of getting an infection. Call your care team for advice if you get a fever, chills, sore throat, or other symptoms of a cold or flu. Do not treat yourself. Try to avoid being around people who are sick. This medication may increase your risk to bruise or bleed. Call your care team if you notice any unusual bleeding. Talk to your care team about your risk of cancer. You may be more at risk for certain types of cancers if you take this medication. Talk to your care team if you or your partner may be pregnant. Serious birth defects can occur if you take this medication during pregnancy and for 6 months after the last dose. Contraception is recommended while taking this medication and for 6 months after the last dose. Your care team can help you find the option that works for you. If your partner can get pregnant, use a condom while taking this medication and for 6 months after the last dose. Do not breastfeed while taking this medication. This medication may cause infertility. Talk to your care team if you are concerned about your fertility. What side effects may I notice from receiving this medication? Side effects that you should report to your care team as soon as possible: Allergic reactions--skin rash, itching, hives, swelling of the face,  lips, tongue, or throat Heart failure--shortness of breath, swelling of the ankles, feet, or hands, sudden weight gain, unusual weakness or fatigue Heart rhythm changes--fast or irregular heartbeat, dizziness, feeling faint or lightheaded, chest pain, trouble breathing Infection--fever, chills, cough, sore throat, wounds that don't heal, pain or trouble when passing urine, general feeling of discomfort or being unwell Low red blood cell level--unusual weakness or fatigue, dizziness, headache, trouble breathing Painful swelling, warmth, or redness of the skin, blisters or sores at the infusion site Unusual bruising or bleeding Side effects that usually do not  require medical attention (report to your care team if they continue or are bothersome): Diarrhea Hair loss Nausea Pain, redness, or swelling with sores inside the mouth or throat Red urine This list may not describe all possible side effects. Call your doctor for medical advice about side effects. You may report side effects to FDA at 1-800-FDA-1088. Where should I keep my medication? This medication is given in a hospital or clinic. It will not be stored at home. NOTE: This sheet is a summary. It may not cover all possible information. If you have questions about this medicine, talk to your doctor, pharmacist, or health care provider.  2024 Elsevier/Gold Standard (2022-10-10 00:00:00)  Cyclophosphamide Injection What is this medication? CYCLOPHOSPHAMIDE (sye kloe FOSS fa mide) treats some types of cancer. It works by slowing down the growth of cancer cells. This medicine may be used for other purposes; ask your health care provider or pharmacist if you have questions. COMMON BRAND NAME(S): Cyclophosphamide, Cytoxan, Neosar What should I tell my care team before I take this medication? They need to know if you have any of these conditions: Heart disease Irregular heartbeat or rhythm Infection Kidney problems Liver disease Low blood cell levels (white cells, platelets, or red blood cells) Lung disease Previous radiation Trouble passing urine An unusual or allergic reaction to cyclophosphamide, other medications, foods, dyes, or preservatives Pregnant or trying to get pregnant Breast-feeding How should I use this medication? This medication is injected into a vein. It is given by your care team in a hospital or clinic setting. Talk to your care team about the use of this medication in children. Special care may be needed. Overdosage: If you think you have taken too much of this medicine contact a poison control center or emergency room at once. NOTE: This medicine is only for you.  Do not share this medicine with others. What if I miss a dose? Keep appointments for follow-up doses. It is important not to miss your dose. Call your care team if you are unable to keep an appointment. What may interact with this medication? Amphotericin B Amiodarone Azathioprine Certain antivirals for HIV or hepatitis Certain medications for blood pressure, such as enalapril, lisinopril, quinapril Cyclosporine Diuretics Etanercept Indomethacin Medications that relax muscles Metronidazole Natalizumab Tamoxifen Warfarin This list may not describe all possible interactions. Give your health care provider a list of all the medicines, herbs, non-prescription drugs, or dietary supplements you use. Also tell them if you smoke, drink alcohol, or use illegal drugs. Some items may interact with your medicine. What should I watch for while using this medication? This medication may make you feel generally unwell. This is not uncommon as chemotherapy can affect healthy cells as well as cancer cells. Report any side effects. Continue your course of treatment even though you feel ill unless your care team tells you to stop. You may need blood work while you are taking this medication. This medication  may increase your risk of getting an infection. Call your care team for advice if you get a fever, chills, sore throat, or other symptoms of a cold or flu. Do not treat yourself. Try to avoid being around people who are sick. Avoid taking medications that contain aspirin, acetaminophen, ibuprofen, naproxen, or ketoprofen unless instructed by your care team. These medications may hide a fever. Be careful brushing or flossing your teeth or using a toothpick because you may get an infection or bleed more easily. If you have any dental work done, tell your dentist you are receiving this medication. Drink water or other fluids as directed. Urinate often, even at night. Some products may contain alcohol. Ask  your care team if this medication contains alcohol. Be sure to tell all care teams you are taking this medicine. Certain medicines, like metronidazole and disulfiram, can cause an unpleasant reaction when taken with alcohol. The reaction includes flushing, headache, nausea, vomiting, sweating, and increased thirst. The reaction can last from 30 minutes to several hours. Talk to your care team if you wish to become pregnant or think you might be pregnant. This medication can cause serious birth defects if taken during pregnancy and for 1 year after the last dose. A negative pregnancy test is required before starting this medication. A reliable form of contraception is recommended while taking this medication and for 1 year after the last dose. Talk to your care team about reliable forms of contraception. Do not father a child while taking this medication and for 4 months after the last dose. Use a condom during this time period. Do not breast-feed while taking this medication or for 1 week after the last dose. This medication may cause infertility. Talk to your care team if you are concerned about your fertility. Talk to your care team about your risk of cancer. You may be more at risk for certain types of cancer if you take this medication. What side effects may I notice from receiving this medication? Side effects that you should report to your care team as soon as possible: Allergic reactions--skin rash, itching, hives, swelling of the face, lips, tongue, or throat Dry cough, shortness of breath or trouble breathing Heart failure--shortness of breath, swelling of the ankles, feet, or hands, sudden weight gain, unusual weakness or fatigue Heart muscle inflammation--unusual weakness or fatigue, shortness of breath, chest pain, fast or irregular heartbeat, dizziness, swelling of the ankles, feet, or hands Heart rhythm changes--fast or irregular heartbeat, dizziness, feeling faint or lightheaded, chest  pain, trouble breathing Infection--fever, chills, cough, sore throat, wounds that don't heal, pain or trouble when passing urine, general feeling of discomfort or being unwell Kidney injury--decrease in the amount of urine, swelling of the ankles, hands, or feet Liver injury--right upper belly pain, loss of appetite, nausea, light-colored stool, dark yellow or brown urine, yellowing skin or eyes, unusual weakness or fatigue Low red blood cell level--unusual weakness or fatigue, dizziness, headache, trouble breathing Low sodium level--muscle weakness, fatigue, dizziness, headache, confusion Red or dark brown urine Unusual bruising or bleeding Side effects that usually do not require medical attention (report to your care team if they continue or are bothersome): Hair loss Irregular menstrual cycles or spotting Loss of appetite Nausea Pain, redness, or swelling with sores inside the mouth or throat Vomiting This list may not describe all possible side effects. Call your doctor for medical advice about side effects. You may report side effects to FDA at 1-800-FDA-1088. Where should I keep my  medication? This medication is given in a hospital or clinic. It will not be stored at home. NOTE: This sheet is a summary. It may not cover all possible information. If you have questions about this medicine, talk to your doctor, pharmacist, or health care provider.  2024 Elsevier/Gold Standard (2021-11-23 00:00:00)  Rituximab Injection What is this medication? RITUXIMAB (ri TUX i mab) treats leukemia and lymphoma. It works by blocking a protein that causes cancer cells to grow and multiply. This helps to slow or stop the spread of cancer cells. It may also be used to treat autoimmune conditions, such as arthritis. It works by slowing down an overactive immune system. It is a monoclonal antibody. This medicine may be used for other purposes; ask your health care provider or pharmacist if you have  questions. COMMON BRAND NAME(S): RIABNI, Rituxan, RUXIENCE, truxima What should I tell my care team before I take this medication? They need to know if you have any of these conditions: Chest pain Heart disease Immune system problems Infection, such as chickenpox, cold sores, hepatitis B, herpes Irregular heartbeat or rhythm Kidney disease Low blood counts, such as low white cells, platelets, red cells Lung disease Recent or upcoming vaccine An unusual or allergic reaction to rituximab, other medications, foods, dyes, or preservatives Pregnant or trying to get pregnant Breast-feeding How should I use this medication? This medication is injected into a vein. It is given by a care team in a hospital or clinic setting. A special MedGuide will be given to you before each treatment. Be sure to read this information carefully each time. Talk to your care team about the use of this medication in children. While this medication may be prescribed for children as young as 6 months for selected conditions, precautions do apply. Overdosage: If you think you have taken too much of this medicine contact a poison control center or emergency room at once. NOTE: This medicine is only for you. Do not share this medicine with others. What if I miss a dose? Keep appointments for follow-up doses. It is important not to miss your dose. Call your care team if you are unable to keep an appointment. What may interact with this medication? Do not take this medication with any of the following: Live vaccines This medication may also interact with the following: Cisplatin This list may not describe all possible interactions. Give your health care provider a list of all the medicines, herbs, non-prescription drugs, or dietary supplements you use. Also tell them if you smoke, drink alcohol, or use illegal drugs. Some items may interact with your medicine. What should I watch for while using this medication? Your  condition will be monitored carefully while you are receiving this medication. You may need blood work while taking this medication. This medication can cause serious infusion reactions. To reduce the risk your care team may give you other medications to take before receiving this one. Be sure to follow the directions from your care team. This medication may increase your risk of getting an infection. Call your care team for advice if you get a fever, chills, sore throat, or other symptoms of a cold or flu. Do not treat yourself. Try to avoid being around people who are sick. Call your care team if you are around anyone with measles, chickenpox, or if you develop sores or blisters that do not heal properly. Avoid taking medications that contain aspirin, acetaminophen, ibuprofen, naproxen, or ketoprofen unless instructed by your care team. These medications  may hide a fever. This medication may cause serious skin reactions. They can happen weeks to months after starting the medication. Contact your care team right away if you notice fevers or flu-like symptoms with a rash. The rash may be red or purple and then turn into blisters or peeling of the skin. You may also notice a red rash with swelling of the face, lips, or lymph nodes in your neck or under your arms. In some patients, this medication may cause a serious brain infection that may cause death. If you have any problems seeing, thinking, speaking, walking, or standing, tell your care team right away. If you cannot reach your care team, urgently seek another source of medical care. Talk to your care team if you may be pregnant. Serious birth defects can occur if you take this medication during pregnancy and for 12 months after the last dose. You will need a negative pregnancy test before starting this medication. Contraception is recommended while taking this medication and for 12 months after the last dose. Your care team can help you find the option  that works for you. Do not breastfeed while taking this medication and for at least 6 months after the last dose. What side effects may I notice from receiving this medication? Side effects that you should report to your care team as soon as possible: Allergic reactions or angioedema--skin rash, itching or hives, swelling of the face, eyes, lips, tongue, arms, or legs, trouble swallowing or breathing Bowel blockage--stomach cramping, unable to have a bowel movement or pass gas, loss of appetite, vomiting Dizziness, loss of balance or coordination, confusion or trouble speaking Heart attack--pain or tightness in the chest, shoulders, arms, or jaw, nausea, shortness of breath, cold or clammy skin, feeling faint or lightheaded Heart rhythm changes--fast or irregular heartbeat, dizziness, feeling faint or lightheaded, chest pain, trouble breathing Infection--fever, chills, cough, sore throat, wounds that don't heal, pain or trouble when passing urine, general feeling of discomfort or being unwell Infusion reactions--chest pain, shortness of breath or trouble breathing, feeling faint or lightheaded Kidney injury--decrease in the amount of urine, swelling of the ankles, hands, or feet Liver injury--right upper belly pain, loss of appetite, nausea, light-colored stool, dark yellow or brown urine, yellowing skin or eyes, unusual weakness or fatigue Redness, blistering, peeling, or loosening of the skin, including inside the mouth Stomach pain that is severe, does not go away, or gets worse Tumor lysis syndrome (TLS)--nausea, vomiting, diarrhea, decrease in the amount of urine, dark urine, unusual weakness or fatigue, confusion, muscle pain or cramps, fast or irregular heartbeat, joint pain Side effects that usually do not require medical attention (report to your care team if they continue or are bothersome): Headache Joint pain Nausea Runny or stuffy nose Unusual weakness or fatigue This list may not  describe all possible side effects. Call your doctor for medical advice about side effects. You may report side effects to FDA at 1-800-FDA-1088. Where should I keep my medication? This medication is given in a hospital or clinic. It will not be stored at home. NOTE: This sheet is a summary. It may not cover all possible information. If you have questions about this medicine, talk to your doctor, pharmacist, or health care provider.  2024 Elsevier/Gold Standard (2021-11-29 00:00:00)

## 2023-06-16 NOTE — Progress Notes (Signed)
[facility] OFFICE PROGRESS NOTE   Diagnosis: Non-Hodgkin's lymphoma  INTERVAL HISTORY:   Amber Park returns as scheduled.  She completed cycle 4R-mini CHOP 05/11/2023.  Vincristine was held due to neuropathy symptoms.  Treatment was held last week due to mild neutropenia.  She denies nausea/vomiting.  No mouth sores.  No change in baseline loose stools.  Stable numbness/tingling mainly in the feet.  Objective:  Vital signs in last 24 hours:  Blood pressure (!) 137/55, pulse (!) 55, temperature (!) 97.5 F (36.4 C), temperature source Oral, resp. rate 18, height 5\' 2"  (1.575 m), weight 144 lb 3.2 oz (65.4 kg), SpO2 97%.    HEENT: No thrush or ulcers. Lymphatics: No palpable cervical, supraclavicular or axillary lymph nodes. Resp: Lungs clear bilaterally. Cardio: Regular rate and rhythm. GI: No hepatosplenomegaly. Vascular: No leg edema. Port-A-Cath without erythema.  Lab Results:  Lab Results  Component Value Date   WBC 5.2 06/16/2023   HGB 12.2 06/16/2023   HCT 36.9 06/16/2023   MCV 83.1 06/16/2023   PLT 296 06/16/2023   NEUTROABS 2.8 06/16/2023    Imaging:  No results found.  Medications: I have reviewed the patient's current medications.  Assessment/Plan: Colon cancer-stage IIIb Cecal mass on colonoscopy 01/03/2020, biopsy confirmed adenocarcinoma CTs 01/11/2020-cecal mass, prominent subcentimeter mesenteric nodes adjacent to the cecum, multiple tiny pulmonary nodules-metastases not favored Laparoscopic right colectomy 02/10/2020,pT3pN1a, grade 2-grade 3, 1/27 lymph nodes, 1 tumor deposit, MSI-high, loss of MLH1 and PMS 2 expression; MLH1 methylation detected CTs 02/05/2021-no evidence of recurrent disease, stable tiny upper lobe pulmonary nodules considered benign CTs 02/12/2022-nonspecific lymphadenopathy in the mesentery with mild mesenteric stranding, largest nodes measure up to 8 mm with no evidence of metastatic disease in the chest CTs  01/13/2023-new pleural-based masses in the lateral left upper chest with destruction of the left anterior second rib, no other evidence of metastatic disease CT-guided biopsy of left chest wall mass 02/21/2023-B-cell lymphoma with a high Ki-67, Bcl-2 positive, negative for double hit or triple hit lymphoma, large B-cell lymphoma per communication with the pathologist PET 03/11/2023-intense hypermetabolic activity associated with left chest wall mass, no evidence of additional sites of lymphoma, no evidence of recurrent colon cancer Cycle 1R-mini CHOP 03/14/2023 Cycle 2R-mini CHOP 04/07/2023 Cycle 3R-mini CHOP 04/28/2023 Cycle 4R-mini CHOP 05/11/2023, vincristine held secondary to neuropathy symptoms PET 06/05/2023-no residual left chest wall mass, remaining metabolic activity at the superior lateral left chest wall SUV 4.0 (previously 30.1), Deauville 3 Cycle 5R-mini CHOP 06/16/2023, vincristine held due to neuropathy symptoms, Udenyca 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 and history of AVMs/Cameron's erosions Ferrous gluconate 07/24/2020-allergic/infusion reaction with chest and back pain, diarrhea, nausea/vomiting, chills, presyncope, hypotension Feraheme 08/14/2020 and 08/21/2020-tolerated well Hiatal hernia Family history of colon and bladder cancer Transverse colon tubular adenoma on the colonoscopy 01/03/2020 Hypertension Glaucoma Admission 02/12/2022 with choledocholithiasis, status post ERCP and stone removal Left chest wall masses on CT 01/13/2023-CT-guided biopsy of left chest wall mass 02/21/2023 Hypercalcemia 03/07/2023-Zometa and IV fluids Echocardiogram 03/10/2023-LVEF 65%, normal left and right ventricular function  Disposition: Ms. Amber Park appears stable.  She has completed 4 cycles of R-mini CHOP.  Vincristine was held with cycle 4 due to neuropathy symptoms.  Neuropathy symptoms persist.  Vincristine will remain on hold.  Treatment was held last week due to mild neutropenia.  CBC from today  reviewed.  Counts adequate to proceed with cycle 5R-mini CHOP today as scheduled.  She will receive Udenyca on the day of pump discontinuation.  We reviewed potential  side effects including bone pain, rash, splenic rupture.  She agrees to proceed.  She will return for follow-up and treatment in 3 weeks.  We are available to see her sooner if needed.   Lonna Cobb ANP/GNP-BC   06/16/2023  9:10 AM

## 2023-06-18 ENCOUNTER — Inpatient Hospital Stay: Payer: Medicare Other

## 2023-06-18 ENCOUNTER — Other Ambulatory Visit: Payer: Self-pay

## 2023-06-18 VITALS — BP 145/55 | HR 62 | Temp 97.8°F | Resp 18

## 2023-06-18 DIAGNOSIS — Z5112 Encounter for antineoplastic immunotherapy: Secondary | ICD-10-CM | POA: Diagnosis not present

## 2023-06-18 DIAGNOSIS — C8590 Non-Hodgkin lymphoma, unspecified, unspecified site: Secondary | ICD-10-CM | POA: Diagnosis not present

## 2023-06-18 DIAGNOSIS — C8589 Other specified types of non-Hodgkin lymphoma, extranodal and solid organ sites: Secondary | ICD-10-CM | POA: Diagnosis not present

## 2023-06-18 DIAGNOSIS — Z79899 Other long term (current) drug therapy: Secondary | ICD-10-CM | POA: Diagnosis not present

## 2023-06-18 DIAGNOSIS — Z5111 Encounter for antineoplastic chemotherapy: Secondary | ICD-10-CM | POA: Diagnosis not present

## 2023-06-18 DIAGNOSIS — C189 Malignant neoplasm of colon, unspecified: Secondary | ICD-10-CM | POA: Diagnosis not present

## 2023-06-18 DIAGNOSIS — D509 Iron deficiency anemia, unspecified: Secondary | ICD-10-CM | POA: Diagnosis not present

## 2023-06-18 DIAGNOSIS — C8580 Other specified types of non-Hodgkin lymphoma, unspecified site: Secondary | ICD-10-CM

## 2023-06-18 MED ORDER — PEGFILGRASTIM-CBQV 6 MG/0.6ML ~~LOC~~ SOSY
6.0000 mg | PREFILLED_SYRINGE | Freq: Once | SUBCUTANEOUS | Status: AC
  Administered 2023-06-18: 6 mg via SUBCUTANEOUS
  Filled 2023-06-18: qty 0.6

## 2023-06-18 NOTE — Patient Instructions (Signed)

## 2023-07-01 DIAGNOSIS — C8590 Non-Hodgkin lymphoma, unspecified, unspecified site: Secondary | ICD-10-CM | POA: Diagnosis not present

## 2023-07-01 DIAGNOSIS — I129 Hypertensive chronic kidney disease with stage 1 through stage 4 chronic kidney disease, or unspecified chronic kidney disease: Secondary | ICD-10-CM | POA: Diagnosis not present

## 2023-07-01 DIAGNOSIS — N1832 Chronic kidney disease, stage 3b: Secondary | ICD-10-CM | POA: Diagnosis not present

## 2023-07-02 DIAGNOSIS — H52223 Regular astigmatism, bilateral: Secondary | ICD-10-CM | POA: Diagnosis not present

## 2023-07-02 DIAGNOSIS — Z961 Presence of intraocular lens: Secondary | ICD-10-CM | POA: Diagnosis not present

## 2023-07-02 DIAGNOSIS — H524 Presbyopia: Secondary | ICD-10-CM | POA: Diagnosis not present

## 2023-07-02 DIAGNOSIS — H5213 Myopia, bilateral: Secondary | ICD-10-CM | POA: Diagnosis not present

## 2023-07-02 DIAGNOSIS — H40053 Ocular hypertension, bilateral: Secondary | ICD-10-CM | POA: Diagnosis not present

## 2023-07-05 ENCOUNTER — Other Ambulatory Visit: Payer: Self-pay | Admitting: Oncology

## 2023-07-07 ENCOUNTER — Inpatient Hospital Stay: Payer: Medicare Other | Admitting: Nurse Practitioner

## 2023-07-07 ENCOUNTER — Ambulatory Visit: Payer: Medicare Other | Admitting: Oncology

## 2023-07-07 ENCOUNTER — Inpatient Hospital Stay: Payer: Medicare Other

## 2023-07-07 ENCOUNTER — Inpatient Hospital Stay: Payer: Medicare Other | Attending: Oncology

## 2023-07-07 ENCOUNTER — Encounter: Payer: Self-pay | Admitting: Nurse Practitioner

## 2023-07-07 VITALS — BP 141/58 | HR 70 | Temp 98.1°F | Resp 18 | Ht 62.0 in | Wt 142.7 lb

## 2023-07-07 VITALS — BP 136/57 | HR 69 | Temp 98.2°F | Resp 17

## 2023-07-07 DIAGNOSIS — Z5112 Encounter for antineoplastic immunotherapy: Secondary | ICD-10-CM | POA: Insufficient documentation

## 2023-07-07 DIAGNOSIS — D509 Iron deficiency anemia, unspecified: Secondary | ICD-10-CM | POA: Insufficient documentation

## 2023-07-07 DIAGNOSIS — C8580 Other specified types of non-Hodgkin lymphoma, unspecified site: Secondary | ICD-10-CM

## 2023-07-07 DIAGNOSIS — C8590 Non-Hodgkin lymphoma, unspecified, unspecified site: Secondary | ICD-10-CM | POA: Insufficient documentation

## 2023-07-07 DIAGNOSIS — I10 Essential (primary) hypertension: Secondary | ICD-10-CM | POA: Diagnosis not present

## 2023-07-07 DIAGNOSIS — K449 Diaphragmatic hernia without obstruction or gangrene: Secondary | ICD-10-CM | POA: Insufficient documentation

## 2023-07-07 DIAGNOSIS — Z5111 Encounter for antineoplastic chemotherapy: Secondary | ICD-10-CM | POA: Diagnosis not present

## 2023-07-07 DIAGNOSIS — Z79899 Other long term (current) drug therapy: Secondary | ICD-10-CM | POA: Insufficient documentation

## 2023-07-07 DIAGNOSIS — H409 Unspecified glaucoma: Secondary | ICD-10-CM | POA: Insufficient documentation

## 2023-07-07 LAB — CBC WITH DIFFERENTIAL (CANCER CENTER ONLY)
Abs Immature Granulocytes: 0.06 10*3/uL (ref 0.00–0.07)
Basophils Absolute: 0.1 10*3/uL (ref 0.0–0.1)
Basophils Relative: 1 %
Eosinophils Absolute: 0.1 10*3/uL (ref 0.0–0.5)
Eosinophils Relative: 1 %
HCT: 37.3 % (ref 36.0–46.0)
Hemoglobin: 12.1 g/dL (ref 12.0–15.0)
Immature Granulocytes: 1 %
Lymphocytes Relative: 8 %
Lymphs Abs: 0.7 10*3/uL (ref 0.7–4.0)
MCH: 27.4 pg (ref 26.0–34.0)
MCHC: 32.4 g/dL (ref 30.0–36.0)
MCV: 84.4 fL (ref 80.0–100.0)
Monocytes Absolute: 1.2 10*3/uL — ABNORMAL HIGH (ref 0.1–1.0)
Monocytes Relative: 14 %
Neutro Abs: 6.6 10*3/uL (ref 1.7–7.7)
Neutrophils Relative %: 75 %
Platelet Count: 387 10*3/uL (ref 150–400)
RBC: 4.42 MIL/uL (ref 3.87–5.11)
RDW: 15.4 % (ref 11.5–15.5)
WBC Count: 8.8 10*3/uL (ref 4.0–10.5)
nRBC: 0 % (ref 0.0–0.2)

## 2023-07-07 LAB — CMP (CANCER CENTER ONLY)
ALT: 9 U/L (ref 0–44)
AST: 20 U/L (ref 15–41)
Albumin: 4 g/dL (ref 3.5–5.0)
Alkaline Phosphatase: 75 U/L (ref 38–126)
Anion gap: 7 (ref 5–15)
BUN: 17 mg/dL (ref 8–23)
CO2: 26 mmol/L (ref 22–32)
Calcium: 9.4 mg/dL (ref 8.9–10.3)
Chloride: 105 mmol/L (ref 98–111)
Creatinine: 1.1 mg/dL — ABNORMAL HIGH (ref 0.44–1.00)
GFR, Estimated: 46 mL/min — ABNORMAL LOW (ref >60)
Glucose, Bld: 80 mg/dL (ref 70–99)
Potassium: 4.4 mmol/L (ref 3.5–5.1)
Sodium: 138 mmol/L (ref 135–145)
Total Bilirubin: 0.4 mg/dL (ref <1.2)
Total Protein: 6.7 g/dL (ref 6.5–8.1)

## 2023-07-07 MED ORDER — DEXAMETHASONE SODIUM PHOSPHATE 10 MG/ML IJ SOLN
10.0000 mg | Freq: Once | INTRAMUSCULAR | Status: AC
  Administered 2023-07-07: 10 mg via INTRAVENOUS
  Filled 2023-07-07: qty 1

## 2023-07-07 MED ORDER — SODIUM CHLORIDE 0.9 % IV SOLN
150.0000 mg | Freq: Once | INTRAVENOUS | Status: AC
  Administered 2023-07-07: 150 mg via INTRAVENOUS
  Filled 2023-07-07: qty 5

## 2023-07-07 MED ORDER — DIPHENHYDRAMINE HCL 25 MG PO CAPS
25.0000 mg | ORAL_CAPSULE | Freq: Once | ORAL | Status: AC
Start: 2023-07-07 — End: 2023-07-07
  Administered 2023-07-07: 25 mg via ORAL
  Filled 2023-07-07: qty 1

## 2023-07-07 MED ORDER — SODIUM CHLORIDE 0.9 % IV SOLN
Freq: Once | INTRAVENOUS | Status: AC
Start: 2023-07-07 — End: 2023-07-07

## 2023-07-07 MED ORDER — DOXORUBICIN HCL CHEMO IV INJECTION 2 MG/ML
25.0000 mg/m2 | Freq: Once | INTRAVENOUS | Status: AC
  Administered 2023-07-07: 42 mg via INTRAVENOUS
  Filled 2023-07-07: qty 21

## 2023-07-07 MED ORDER — PALONOSETRON HCL INJECTION 0.25 MG/5ML
0.2500 mg | Freq: Once | INTRAVENOUS | Status: AC
  Administered 2023-07-07: 0.25 mg via INTRAVENOUS
  Filled 2023-07-07: qty 5

## 2023-07-07 MED ORDER — HEPARIN SOD (PORK) LOCK FLUSH 100 UNIT/ML IV SOLN
500.0000 [IU] | Freq: Once | INTRAVENOUS | Status: AC | PRN
  Administered 2023-07-07: 500 [IU]

## 2023-07-07 MED ORDER — SODIUM CHLORIDE 0.9 % IV SOLN
375.0000 mg/m2 | Freq: Once | INTRAVENOUS | Status: AC
  Administered 2023-07-07: 600 mg via INTRAVENOUS
  Filled 2023-07-07: qty 50

## 2023-07-07 MED ORDER — CYCLOPHOSPHAMIDE CHEMO INJECTION 1 GM
300.0000 mg/m2 | Freq: Once | INTRAMUSCULAR | Status: AC
  Administered 2023-07-07: 500 mg via INTRAVENOUS
  Filled 2023-07-07: qty 25

## 2023-07-07 MED ORDER — ACETAMINOPHEN 325 MG PO TABS
650.0000 mg | ORAL_TABLET | Freq: Once | ORAL | Status: AC
  Administered 2023-07-07: 650 mg via ORAL
  Filled 2023-07-07: qty 2

## 2023-07-07 MED ORDER — SODIUM CHLORIDE 0.9% FLUSH
10.0000 mL | INTRAVENOUS | Status: DC | PRN
  Administered 2023-07-07: 10 mL

## 2023-07-07 NOTE — Patient Instructions (Signed)
[facility]   Discharge Instructions: Thank you for choosing [facility] to provide your oncology and hematology care.   If you have a lab appointment with the Cancer Center, please go directly to the Cancer Center and check in at the registration area.   Wear comfortable clothing and clothing appropriate for easy access to any Portacath or PICC line.   We strive to give you quality time with your provider. You may need to reschedule your appointment if you arrive late (15 or more minutes).  Arriving late affects you and other patients whose appointments are after yours.  Also, if you miss three or more appointments without notifying the office, you may be dismissed from the clinic at the provider's discretion.      For prescription refill requests, have your pharmacy contact our office and allow 72 hours for refills to be completed.    Today you received the following chemotherapy and/or immunotherapy agents DOXORUBICIN/CYTOXAN/RITUXIMAB      To help prevent nausea and vomiting after your treatment, we encourage you to take your nausea medication as directed.  BELOW ARE SYMPTOMS THAT SHOULD BE REPORTED IMMEDIATELY: *FEVER GREATER THAN 100.4 F (38 C) OR HIGHER *CHILLS OR SWEATING *NAUSEA AND VOMITING THAT IS NOT CONTROLLED WITH YOUR NAUSEA MEDICATION *UNUSUAL SHORTNESS OF BREATH *UNUSUAL BRUISING OR BLEEDING *URINARY PROBLEMS (pain or burning when urinating, or frequent urination) *BOWEL PROBLEMS (unusual diarrhea, constipation, pain near the anus) TENDERNESS IN MOUTH AND THROAT WITH OR WITHOUT PRESENCE OF ULCERS (sore throat, sores in mouth, or a toothache) UNUSUAL RASH, SWELLING OR PAIN  UNUSUAL VAGINAL DISCHARGE OR ITCHING   Items with * indicate a potential emergency and should be followed up as soon as possible or go to the Emergency Department if any problems should occur.  Please show the CHEMOTHERAPY ALERT  CARD or IMMUNOTHERAPY ALERT CARD at check-in to the Emergency Department and triage nurse.  Should you have questions after your visit or need to cancel or reschedule your appointment, please contact [facility]  Dept: [phone]  and follow the prompts.  Office hours are 8:00 a.m. to 4:30 p.m. Monday - Friday. Please note that voicemails left after 4:00 p.m. may not be returned until the following business day.  We are closed weekends and major holidays. You have access to a nurse at all times for urgent questions. Please call the main number to the clinic Dept: [phone] and follow the prompts.   For any non-urgent questions, you may also contact your provider using MyChart. We now offer e-Visits for anyone 45 and older to request care online for non-urgent symptoms. For details visit [email or web address].   Also download the MyChart app! Go to the app store, search "MyChart", open the app, select [place], and log in with your MyChart username and password.  Doxorubicin Injection What is this medication? DOXORUBICIN (dox oh ROO bi sin) treats some types of cancer. It works by slowing down the growth of cancer cells. This medicine may be used for other purposes; ask your health care provider or pharmacist if you have questions. COMMON BRAND NAME(S): Adriamycin, Adriamycin PFS, Adriamycin RDF, Rubex What should I tell my care team before I take this medication? They need to know if you have any of these conditions: Heart disease History of low blood cell levels caused by a medication Liver disease Recent or ongoing  radiation An unusual or allergic reaction to doxorubicin, other medications, foods, dyes, or preservatives If you or your partner are pregnant or trying to get pregnant Breast-feeding How should I use this medication? This medication is injected into a vein. It is given by your care team in a hospital or clinic  setting. Talk to your care team about the use of this medication in children. Special care may be needed. Overdosage: If you think you have taken too much of this medicine contact a poison control center or emergency room at once. NOTE: This medicine is only for you. Do not share this medicine with others. What if I miss a dose? Keep appointments for follow-up doses. It is important not to miss your dose. Call your care team if you are unable to keep an appointment. What may interact with this medication? 6-mercaptopurine Paclitaxel Phenytoin St. John's wort Trastuzumab Verapamil This list may not describe all possible interactions. Give your health care provider a list of all the medicines, herbs, non-prescription drugs, or dietary supplements you use. Also tell them if you smoke, drink alcohol, or use illegal drugs. Some items may interact with your medicine. What should I watch for while using this medication? Your condition will be monitored carefully while you are receiving this medication. You may need blood work while taking this medication. This medication may make you feel generally unwell. This is not uncommon as chemotherapy can affect healthy cells as well as cancer cells. Report any side effects. Continue your course of treatment even though you feel ill unless your care team tells you to stop. There is a maximum amount of this medication you should receive throughout your life. The amount depends on the medical condition being treated and your overall health. Your care team will watch how much of this medication you receive. Tell your care team if you have taken this medication before. Your urine may turn red for a few days after your dose. This is not blood. If your urine is dark or brown, call your care team. In some cases, you may be given additional medications to help with side effects. Follow all directions for their use. This medication may increase your risk of getting an  infection. Call your care team for advice if you get a fever, chills, sore throat, or other symptoms of a cold or flu. Do not treat yourself. Try to avoid being around people who are sick. This medication may increase your risk to bruise or bleed. Call your care team if you notice any unusual bleeding. Talk to your care team about your risk of cancer. You may be more at risk for certain types of cancers if you take this medication. Talk to your care team if you or your partner may be pregnant. Serious birth defects can occur if you take this medication during pregnancy and for 6 months after the last dose. Contraception is recommended while taking this medication and for 6 months after the last dose. Your care team can help you find the option that works for you. If your partner can get pregnant, use a condom while taking this medication and for 6 months after the last dose. Do not breastfeed while taking this medication. This medication may cause infertility. Talk to your care team if you are concerned about your fertility. What side effects may I notice from receiving this medication? Side effects that you should report to your care team as soon as possible: Allergic reactions--skin rash, itching, hives, swelling of  the face, lips, tongue, or throat Heart failure--shortness of breath, swelling of the ankles, feet, or hands, sudden weight gain, unusual weakness or fatigue Heart rhythm changes--fast or irregular heartbeat, dizziness, feeling faint or lightheaded, chest pain, trouble breathing Infection--fever, chills, cough, sore throat, wounds that don't heal, pain or trouble when passing urine, general feeling of discomfort or being unwell Low red blood cell level--unusual weakness or fatigue, dizziness, headache, trouble breathing Painful swelling, warmth, or redness of the skin, blisters or sores at the infusion site Unusual bruising or bleeding Side effects that usually do not require medical  attention (report to your care team if they continue or are bothersome): Diarrhea Hair loss Nausea Pain, redness, or swelling with sores inside the mouth or throat Red urine This list may not describe all possible side effects. Call your doctor for medical advice about side effects. You may report side effects to FDA at 1-800-FDA-1088. Where should I keep my medication? This medication is given in a hospital or clinic. It will not be stored at home. NOTE: This sheet is a summary. It may not cover all possible information. If you have questions about this medicine, talk to your doctor, pharmacist, or health care provider.  2024 Elsevier/Gold Standard (2022-10-10 00:00:00) Cyclophosphamide Injection What is this medication? CYCLOPHOSPHAMIDE (sye kloe FOSS fa mide) treats some types of cancer. It works by slowing down the growth of cancer cells. This medicine may be used for other purposes; ask your health care provider or pharmacist if you have questions. COMMON BRAND NAME(S): Cyclophosphamide, Cytoxan, Neosar What should I tell my care team before I take this medication? They need to know if you have any of these conditions: Heart disease Irregular heartbeat or rhythm Infection Kidney problems Liver disease Low blood cell levels (white cells, platelets, or red blood cells) Lung disease Previous radiation Trouble passing urine An unusual or allergic reaction to cyclophosphamide, other medications, foods, dyes, or preservatives Pregnant or trying to get pregnant Breast-feeding How should I use this medication? This medication is injected into a vein. It is given by your care team in a hospital or clinic setting. Talk to your care team about the use of this medication in children. Special care may be needed. Overdosage: If you think you have taken too much of this medicine contact a poison control center or emergency room at once. NOTE: This medicine is only for you. Do not share this  medicine with others. What if I miss a dose? Keep appointments for follow-up doses. It is important not to miss your dose. Call your care team if you are unable to keep an appointment. What may interact with this medication? Amphotericin B Amiodarone Azathioprine Certain antivirals for HIV or hepatitis Certain medications for blood pressure, such as enalapril, lisinopril, quinapril Cyclosporine Diuretics Etanercept Indomethacin Medications that relax muscles Metronidazole Natalizumab Tamoxifen Warfarin This list may not describe all possible interactions. Give your health care provider a list of all the medicines, herbs, non-prescription drugs, or dietary supplements you use. Also tell them if you smoke, drink alcohol, or use illegal drugs. Some items may interact with your medicine. What should I watch for while using this medication? This medication may make you feel generally unwell. This is not uncommon as chemotherapy can affect healthy cells as well as cancer cells. Report any side effects. Continue your course of treatment even though you feel ill unless your care team tells you to stop. You may need blood work while you are taking this medication. This  medication may increase your risk of getting an infection. Call your care team for advice if you get a fever, chills, sore throat, or other symptoms of a cold or flu. Do not treat yourself. Try to avoid being around people who are sick. Avoid taking medications that contain aspirin, acetaminophen, ibuprofen, naproxen, or ketoprofen unless instructed by your care team. These medications may hide a fever. Be careful brushing or flossing your teeth or using a toothpick because you may get an infection or bleed more easily. If you have any dental work done, tell your dentist you are receiving this medication. Drink water or other fluids as directed. Urinate often, even at night. Some products may contain alcohol. Ask your care team if  this medication contains alcohol. Be sure to tell all care teams you are taking this medicine. Certain medicines, like metronidazole and disulfiram, can cause an unpleasant reaction when taken with alcohol. The reaction includes flushing, headache, nausea, vomiting, sweating, and increased thirst. The reaction can last from 30 minutes to several hours. Talk to your care team if you wish to become pregnant or think you might be pregnant. This medication can cause serious birth defects if taken during pregnancy and for 1 year after the last dose. A negative pregnancy test is required before starting this medication. A reliable form of contraception is recommended while taking this medication and for 1 year after the last dose. Talk to your care team about reliable forms of contraception. Do not father a child while taking this medication and for 4 months after the last dose. Use a condom during this time period. Do not breast-feed while taking this medication or for 1 week after the last dose. This medication may cause infertility. Talk to your care team if you are concerned about your fertility. Talk to your care team about your risk of cancer. You may be more at risk for certain types of cancer if you take this medication. What side effects may I notice from receiving this medication? Side effects that you should report to your care team as soon as possible: Allergic reactions--skin rash, itching, hives, swelling of the face, lips, tongue, or throat Dry cough, shortness of breath or trouble breathing Heart failure--shortness of breath, swelling of the ankles, feet, or hands, sudden weight gain, unusual weakness or fatigue Heart muscle inflammation--unusual weakness or fatigue, shortness of breath, chest pain, fast or irregular heartbeat, dizziness, swelling of the ankles, feet, or hands Heart rhythm changes--fast or irregular heartbeat, dizziness, feeling faint or lightheaded, chest pain, trouble  breathing Infection--fever, chills, cough, sore throat, wounds that don't heal, pain or trouble when passing urine, general feeling of discomfort or being unwell Kidney injury--decrease in the amount of urine, swelling of the ankles, hands, or feet Liver injury--right upper belly pain, loss of appetite, nausea, light-colored stool, dark yellow or brown urine, yellowing skin or eyes, unusual weakness or fatigue Low red blood cell level--unusual weakness or fatigue, dizziness, headache, trouble breathing Low sodium level--muscle weakness, fatigue, dizziness, headache, confusion Red or dark brown urine Unusual bruising or bleeding Side effects that usually do not require medical attention (report to your care team if they continue or are bothersome): Hair loss Irregular menstrual cycles or spotting Loss of appetite Nausea Pain, redness, or swelling with sores inside the mouth or throat Vomiting This list may not describe all possible side effects. Call your doctor for medical advice about side effects. You may report side effects to FDA at 1-800-FDA-1088. Where should I keep  my medication? This medication is given in a hospital or clinic. It will not be stored at home. NOTE: This sheet is a summary. It may not cover all possible information. If you have questions about this medicine, talk to your doctor, pharmacist, or health care provider.  2024 Elsevier/Gold Standard (2021-11-23 00:00:00) Rituximab Injection What is this medication? RITUXIMAB (ri TUX i mab) treats leukemia and lymphoma. It works by blocking a protein that causes cancer cells to grow and multiply. This helps to slow or stop the spread of cancer cells. It may also be used to treat autoimmune conditions, such as arthritis. It works by slowing down an overactive immune system. It is a monoclonal antibody. This medicine may be used for other purposes; ask your health care provider or pharmacist if you have questions. COMMON BRAND  NAME(S): RIABNI, Rituxan, RUXIENCE, truxima What should I tell my care team before I take this medication? They need to know if you have any of these conditions: Chest pain Heart disease Immune system problems Infection, such as chickenpox, cold sores, hepatitis B, herpes Irregular heartbeat or rhythm Kidney disease Low blood counts, such as low white cells, platelets, red cells Lung disease Recent or upcoming vaccine An unusual or allergic reaction to rituximab, other medications, foods, dyes, or preservatives Pregnant or trying to get pregnant Breast-feeding How should I use this medication? This medication is injected into a vein. It is given by a care team in a hospital or clinic setting. A special MedGuide will be given to you before each treatment. Be sure to read this information carefully each time. Talk to your care team about the use of this medication in children. While this medication may be prescribed for children as young as 6 months for selected conditions, precautions do apply. Overdosage: If you think you have taken too much of this medicine contact a poison control center or emergency room at once. NOTE: This medicine is only for you. Do not share this medicine with others. What if I miss a dose? Keep appointments for follow-up doses. It is important not to miss your dose. Call your care team if you are unable to keep an appointment. What may interact with this medication? Do not take this medication with any of the following: Live vaccines This medication may also interact with the following: Cisplatin This list may not describe all possible interactions. Give your health care provider a list of all the medicines, herbs, non-prescription drugs, or dietary supplements you use. Also tell them if you smoke, drink alcohol, or use illegal drugs. Some items may interact with your medicine. What should I watch for while using this medication? Your condition will be monitored  carefully while you are receiving this medication. You may need blood work while taking this medication. This medication can cause serious infusion reactions. To reduce the risk your care team may give you other medications to take before receiving this one. Be sure to follow the directions from your care team. This medication may increase your risk of getting an infection. Call your care team for advice if you get a fever, chills, sore throat, or other symptoms of a cold or flu. Do not treat yourself. Try to avoid being around people who are sick. Call your care team if you are around anyone with measles, chickenpox, or if you develop sores or blisters that do not heal properly. Avoid taking medications that contain aspirin, acetaminophen, ibuprofen, naproxen, or ketoprofen unless instructed by your care team. These medications  may hide a fever. This medication may cause serious skin reactions. They can happen weeks to months after starting the medication. Contact your care team right away if you notice fevers or flu-like symptoms with a rash. The rash may be red or purple and then turn into blisters or peeling of the skin. You may also notice a red rash with swelling of the face, lips, or lymph nodes in your neck or under your arms. In some patients, this medication may cause a serious brain infection that may cause death. If you have any problems seeing, thinking, speaking, walking, or standing, tell your care team right away. If you cannot reach your care team, urgently seek another source of medical care. Talk to your care team if you may be pregnant. Serious birth defects can occur if you take this medication during pregnancy and for 12 months after the last dose. You will need a negative pregnancy test before starting this medication. Contraception is recommended while taking this medication and for 12 months after the last dose. Your care team can help you find the option that works for you. Do not  breastfeed while taking this medication and for at least 6 months after the last dose. What side effects may I notice from receiving this medication? Side effects that you should report to your care team as soon as possible: Allergic reactions or angioedema--skin rash, itching or hives, swelling of the face, eyes, lips, tongue, arms, or legs, trouble swallowing or breathing Bowel blockage--stomach cramping, unable to have a bowel movement or pass gas, loss of appetite, vomiting Dizziness, loss of balance or coordination, confusion or trouble speaking Heart attack--pain or tightness in the chest, shoulders, arms, or jaw, nausea, shortness of breath, cold or clammy skin, feeling faint or lightheaded Heart rhythm changes--fast or irregular heartbeat, dizziness, feeling faint or lightheaded, chest pain, trouble breathing Infection--fever, chills, cough, sore throat, wounds that don't heal, pain or trouble when passing urine, general feeling of discomfort or being unwell Infusion reactions--chest pain, shortness of breath or trouble breathing, feeling faint or lightheaded Kidney injury--decrease in the amount of urine, swelling of the ankles, hands, or feet Liver injury--right upper belly pain, loss of appetite, nausea, light-colored stool, dark yellow or brown urine, yellowing skin or eyes, unusual weakness or fatigue Redness, blistering, peeling, or loosening of the skin, including inside the mouth Stomach pain that is severe, does not go away, or gets worse Tumor lysis syndrome (TLS)--nausea, vomiting, diarrhea, decrease in the amount of urine, dark urine, unusual weakness or fatigue, confusion, muscle pain or cramps, fast or irregular heartbeat, joint pain Side effects that usually do not require medical attention (report to your care team if they continue or are bothersome): Headache Joint pain Nausea Runny or stuffy nose Unusual weakness or fatigue This list may not describe all possible side  effects. Call your doctor for medical advice about side effects. You may report side effects to FDA at 1-800-FDA-1088. Where should I keep my medication? This medication is given in a hospital or clinic. It will not be stored at home. NOTE: This sheet is a summary. It may not cover all possible information. If you have questions about this medicine, talk to your doctor, pharmacist, or health care provider.  2024 Elsevier/Gold Standard (2021-11-29 00:00:00)

## 2023-07-07 NOTE — Progress Notes (Signed)
[facility] OFFICE PROGRESS NOTE   Diagnosis: Non-Hodgkin's lymphoma  INTERVAL HISTORY:   Amber Park returns as scheduled.  She completed cycle 5R-mini CHOP 06/16/2023.  Vincristine was held due to neuropathy symptoms.  Neuropathy symptoms are unchanged.  No nausea or vomiting.  No mouth sores.  No change in baseline bowel habits.  No bone pain following Udenyca.  Objective:  Vital signs in last 24 hours:  Blood pressure (!) 141/58, pulse 70, temperature 98.1 F (36.7 C), temperature source Temporal, resp. rate 18, height 5\' 2"  (1.575 m), weight 142 lb 11.2 oz (64.7 kg), SpO2 97%.    HEENT: No thrush or ulcers. Lymphatics: No palpable cervical, supraclavicular or axillary lymph nodes. Resp: Lungs clear bilaterally. Cardio: Regular rate and rhythm. GI: No hepatosplenomegaly. Vascular: No leg edema.  Skin: No rash. Port-A-Cath without erythema.  Lab Results:  Lab Results  Component Value Date   WBC 8.8 07/07/2023   HGB 12.1 07/07/2023   HCT 37.3 07/07/2023   MCV 84.4 07/07/2023   PLT 387 07/07/2023   NEUTROABS 6.6 07/07/2023    Imaging:  No results found.  Medications: I have reviewed the patient's current medications.  Assessment/Plan: Colon cancer-stage IIIb Cecal mass on colonoscopy 01/03/2020, biopsy confirmed adenocarcinoma CTs 01/11/2020-cecal mass, prominent subcentimeter mesenteric nodes adjacent to the cecum, multiple tiny pulmonary nodules-metastases not favored Laparoscopic right colectomy 02/10/2020,pT3pN1a, grade 2-grade 3, 1/27 lymph nodes, 1 tumor deposit, MSI-high, loss of MLH1 and PMS 2 expression; MLH1 methylation detected CTs 02/05/2021-no evidence of recurrent disease, stable tiny upper lobe pulmonary nodules considered benign CTs 02/12/2022-nonspecific lymphadenopathy in the mesentery with mild mesenteric stranding, largest nodes measure up to 8 mm with no evidence of metastatic disease in the chest CTs 01/13/2023-new pleural-based  masses in the lateral left upper chest with destruction of the left anterior second rib, no other evidence of metastatic disease CT-guided biopsy of left chest wall mass 02/21/2023-B-cell lymphoma with a high Ki-67, Bcl-2 positive, negative for double hit or triple hit lymphoma, large B-cell lymphoma per communication with the pathologist PET 03/11/2023-intense hypermetabolic activity associated with left chest wall mass, no evidence of additional sites of lymphoma, no evidence of recurrent colon cancer Cycle 1R-mini CHOP 03/14/2023 Cycle 2R-mini CHOP 04/07/2023 Cycle 3R-mini CHOP 04/28/2023 Cycle 4R-mini CHOP 05/11/2023, vincristine held secondary to neuropathy symptoms PET 06/05/2023-no residual left chest wall mass, remaining metabolic activity at the superior lateral left chest wall SUV 4.0 (previously 30.1), Deauville 3 Cycle 5R-mini CHOP 06/16/2023, vincristine held due to neuropathy symptoms, Udenyca Cycle 6R-mini CHOP 07/07/2023, vincristine held due to neuropathy, Udenyca 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 and history of AVMs/Cameron's erosions Ferrous gluconate 07/24/2020-allergic/infusion reaction with chest and back pain, diarrhea, nausea/vomiting, chills, presyncope, hypotension Feraheme 08/14/2020 and 08/21/2020-tolerated well Hiatal hernia Family history of colon and bladder cancer Transverse colon tubular adenoma on the colonoscopy 01/03/2020 Hypertension Glaucoma Admission 02/12/2022 with choledocholithiasis, status post ERCP and stone removal Left chest wall masses on CT 01/13/2023-CT-guided biopsy of left chest wall mass 02/21/2023 Hypercalcemia 03/07/2023-Zometa and IV fluids Echocardiogram 03/10/2023-LVEF 65%, normal left and right ventricular function    Disposition: Amber Park appears stable.  She has completed 5 cycles of R mini CHOP.  Plan to proceed with cycle 6 today as scheduled.  Vincristine will remain on hold due to persistent neuropathy symptoms.  CBC reviewed.  Counts  adequate to proceed with treatment.  Chemistry panel is pending.  She will return for lab and follow-up in approximately 3 weeks.  We are available to  see her sooner if needed.  Lonna Cobb ANP/GNP-BC   07/07/2023  9:19 AM

## 2023-07-07 NOTE — Progress Notes (Addendum)
Patient seen by Lonna Cobb NP today  Vitals are within treatment parameters:Yes   Labs are within treatment parameters: Yes   Treatment plan has been signed: Yes   Per physician team, Patient is ready for treatment and there are NO modifications to the treatment plan. Vincristine will remain on hold due to persistent neuropathy symptoms.

## 2023-07-08 ENCOUNTER — Telehealth: Payer: Self-pay

## 2023-07-08 NOTE — Telephone Encounter (Signed)
The patient visited yesterday and expressed concerns regarding a denial of service from their insurance provider. I have emailed the relevant documents to Darlene C. for her review.

## 2023-07-09 ENCOUNTER — Inpatient Hospital Stay: Payer: Medicare Other

## 2023-07-09 VITALS — BP 151/56 | HR 64 | Temp 97.6°F | Resp 18

## 2023-07-09 DIAGNOSIS — H409 Unspecified glaucoma: Secondary | ICD-10-CM | POA: Diagnosis not present

## 2023-07-09 DIAGNOSIS — K449 Diaphragmatic hernia without obstruction or gangrene: Secondary | ICD-10-CM | POA: Diagnosis not present

## 2023-07-09 DIAGNOSIS — Z79899 Other long term (current) drug therapy: Secondary | ICD-10-CM | POA: Diagnosis not present

## 2023-07-09 DIAGNOSIS — Z5112 Encounter for antineoplastic immunotherapy: Secondary | ICD-10-CM | POA: Diagnosis not present

## 2023-07-09 DIAGNOSIS — D509 Iron deficiency anemia, unspecified: Secondary | ICD-10-CM | POA: Diagnosis not present

## 2023-07-09 DIAGNOSIS — Z5111 Encounter for antineoplastic chemotherapy: Secondary | ICD-10-CM | POA: Diagnosis not present

## 2023-07-09 DIAGNOSIS — C8580 Other specified types of non-Hodgkin lymphoma, unspecified site: Secondary | ICD-10-CM

## 2023-07-09 DIAGNOSIS — I10 Essential (primary) hypertension: Secondary | ICD-10-CM | POA: Diagnosis not present

## 2023-07-09 DIAGNOSIS — C8590 Non-Hodgkin lymphoma, unspecified, unspecified site: Secondary | ICD-10-CM | POA: Diagnosis not present

## 2023-07-09 MED ORDER — PEGFILGRASTIM-CBQV 6 MG/0.6ML ~~LOC~~ SOSY
6.0000 mg | PREFILLED_SYRINGE | Freq: Once | SUBCUTANEOUS | Status: AC
  Administered 2023-07-09: 6 mg via SUBCUTANEOUS
  Filled 2023-07-09: qty 0.6

## 2023-07-09 NOTE — Patient Instructions (Signed)

## 2023-07-25 ENCOUNTER — Other Ambulatory Visit: Payer: Self-pay

## 2023-07-27 ENCOUNTER — Other Ambulatory Visit: Payer: Self-pay

## 2023-07-29 ENCOUNTER — Other Ambulatory Visit: Payer: Medicare Other

## 2023-07-29 ENCOUNTER — Inpatient Hospital Stay: Payer: Medicare Other | Admitting: Oncology

## 2023-07-29 ENCOUNTER — Inpatient Hospital Stay: Payer: Medicare Other | Attending: Oncology

## 2023-07-29 ENCOUNTER — Inpatient Hospital Stay: Payer: Medicare Other

## 2023-07-29 VITALS — BP 154/62 | HR 65 | Temp 98.5°F | Resp 20 | Ht 62.0 in | Wt 140.7 lb

## 2023-07-29 DIAGNOSIS — Z8572 Personal history of non-Hodgkin lymphomas: Secondary | ICD-10-CM | POA: Diagnosis not present

## 2023-07-29 DIAGNOSIS — Z452 Encounter for adjustment and management of vascular access device: Secondary | ICD-10-CM | POA: Diagnosis not present

## 2023-07-29 DIAGNOSIS — C182 Malignant neoplasm of ascending colon: Secondary | ICD-10-CM

## 2023-07-29 DIAGNOSIS — Z85038 Personal history of other malignant neoplasm of large intestine: Secondary | ICD-10-CM | POA: Insufficient documentation

## 2023-07-29 DIAGNOSIS — C8590 Non-Hodgkin lymphoma, unspecified, unspecified site: Secondary | ICD-10-CM | POA: Diagnosis not present

## 2023-07-29 DIAGNOSIS — D509 Iron deficiency anemia, unspecified: Secondary | ICD-10-CM | POA: Insufficient documentation

## 2023-07-29 LAB — CMP (CANCER CENTER ONLY)
ALT: 10 U/L (ref 0–44)
AST: 24 U/L (ref 15–41)
Albumin: 4.2 g/dL (ref 3.5–5.0)
Alkaline Phosphatase: 73 U/L (ref 38–126)
Anion gap: 9 (ref 5–15)
BUN: 16 mg/dL (ref 8–23)
CO2: 25 mmol/L (ref 22–32)
Calcium: 9.3 mg/dL (ref 8.9–10.3)
Chloride: 104 mmol/L (ref 98–111)
Creatinine: 0.93 mg/dL (ref 0.44–1.00)
GFR, Estimated: 56 mL/min — ABNORMAL LOW (ref >60)
Glucose, Bld: 87 mg/dL (ref 70–99)
Potassium: 4.8 mmol/L (ref 3.5–5.1)
Sodium: 138 mmol/L (ref 135–145)
Total Bilirubin: 0.5 mg/dL (ref 0.0–1.2)
Total Protein: 6.8 g/dL (ref 6.5–8.1)

## 2023-07-29 LAB — CBC WITH DIFFERENTIAL (CANCER CENTER ONLY)
Abs Immature Granulocytes: 0.04 10*3/uL (ref 0.00–0.07)
Basophils Absolute: 0.1 10*3/uL (ref 0.0–0.1)
Basophils Relative: 2 %
Eosinophils Absolute: 0.1 10*3/uL (ref 0.0–0.5)
Eosinophils Relative: 1 %
HCT: 38 % (ref 36.0–46.0)
Hemoglobin: 12.4 g/dL (ref 12.0–15.0)
Immature Granulocytes: 1 %
Lymphocytes Relative: 9 %
Lymphs Abs: 0.7 10*3/uL (ref 0.7–4.0)
MCH: 27.3 pg (ref 26.0–34.0)
MCHC: 32.6 g/dL (ref 30.0–36.0)
MCV: 83.5 fL (ref 80.0–100.0)
Monocytes Absolute: 1 10*3/uL (ref 0.1–1.0)
Monocytes Relative: 13 %
Neutro Abs: 5.9 10*3/uL (ref 1.7–7.7)
Neutrophils Relative %: 74 %
Platelet Count: 331 10*3/uL (ref 150–400)
RBC: 4.55 MIL/uL (ref 3.87–5.11)
RDW: 16 % — ABNORMAL HIGH (ref 11.5–15.5)
WBC Count: 7.9 10*3/uL (ref 4.0–10.5)
nRBC: 0 % (ref 0.0–0.2)

## 2023-07-29 LAB — LACTATE DEHYDROGENASE: LDH: 157 U/L (ref 98–192)

## 2023-07-29 LAB — FERRITIN: Ferritin: 36 ng/mL (ref 11–307)

## 2023-07-29 LAB — CEA (ACCESS): CEA (CHCC): 1.21 ng/mL (ref 0.00–5.00)

## 2023-07-29 NOTE — Progress Notes (Signed)
 [facility] OFFICE PROGRESS NOTE   Diagnosis: Non-Hodgkin's lymphoma  INTERVAL HISTORY:   Ms. Ende completed another cycle of R-CHOP on 07/07/2023.  No nausea/vomiting, mouth sores, or rash.  She has intermittent soreness at the left anterior chest.  No other complaint.  Objective:  Vital signs in last 24 hours:  Blood pressure (!) 154/62, pulse 65, temperature 98.5 F (36.9 C), temperature source Oral, resp. rate 20, height 5' 2 (1.575 m), weight 140 lb 11.2 oz (63.8 kg), SpO2 98%.    HEENT: No thrush or ulcers Lymphatics: No cervical, supraclavicular, axillary, or inguinal nodes Resp: Clear bilaterally Cardio: Regular rate and rhythm GI: No hepatosplenomegaly Vascular: No leg edema  Portacath/PICC-without erythema  Lab Results:  Lab Results  Component Value Date   WBC 7.9 07/29/2023   HGB 12.4 07/29/2023   HCT 38.0 07/29/2023   MCV 83.5 07/29/2023   PLT 331 07/29/2023   NEUTROABS 5.9 07/29/2023    CMP  Lab Results  Component Value Date   NA 138 07/29/2023   K 4.8 07/29/2023   CL 104 07/29/2023   CO2 25 07/29/2023   GLUCOSE 87 07/29/2023   BUN 16 07/29/2023   CREATININE 0.93 07/29/2023   CALCIUM 9.3 07/29/2023   PROT 6.8 07/29/2023   ALBUMIN  4.2 07/29/2023   AST 24 07/29/2023   ALT 10 07/29/2023   ALKPHOS 73 07/29/2023   BILITOT 0.5 07/29/2023   GFRNONAA 56 (L) 07/29/2023   GFRAA 60 (L) 02/13/2020    Lab Results  Component Value Date   CEA1 1.78 12/04/2020   CEA 1.21 07/29/2023     Medications: I have reviewed the patient's current medications.   Assessment/Plan: Colon cancer-stage IIIb Cecal mass on colonoscopy 01/03/2020, biopsy confirmed adenocarcinoma CTs 01/11/2020-cecal mass, prominent subcentimeter mesenteric nodes adjacent to the cecum, multiple tiny pulmonary nodules-metastases not favored Laparoscopic right colectomy 02/10/2020,pT3pN1a, grade 2-grade 3, 1/27 lymph nodes, 1 tumor deposit, MSI-high, loss of MLH1 and PMS  2 expression; MLH1 methylation detected CTs 02/05/2021-no evidence of recurrent disease, stable tiny upper lobe pulmonary nodules considered benign CTs 02/12/2022-nonspecific lymphadenopathy in the mesentery with mild mesenteric stranding, largest nodes measure up to 8 mm with no evidence of metastatic disease in the chest CTs 01/13/2023-new pleural-based masses in the lateral left upper chest with destruction of the left anterior second rib, no other evidence of metastatic disease CT-guided biopsy of left chest wall mass 02/21/2023-B-cell lymphoma with a high Ki-67, Bcl-2 positive, negative for double hit or triple hit lymphoma, large B-cell lymphoma per communication with the pathologist PET 03/11/2023-intense hypermetabolic activity associated with left chest wall mass, no evidence of additional sites of lymphoma, no evidence of recurrent colon cancer Cycle 1R-mini CHOP 03/14/2023 Cycle 2R-mini CHOP 04/07/2023 Cycle 3R-mini CHOP 04/28/2023 Cycle 4R-mini CHOP 05/11/2023, vincristine  held secondary to neuropathy symptoms PET 06/05/2023-no residual left chest wall mass, remaining metabolic activity at the superior lateral left chest wall SUV 4.0 (previously 30.1), Deauville 3 Cycle 5R-mini CHOP 06/16/2023, vincristine  held due to neuropathy symptoms, Udenyca  Cycle 6R-mini CHOP 07/07/2023, vincristine  held due to neuropathy, Udenyca  Iron  deficiency anemia secondary to #1 and history of AVMs/Cameron's erosions Ferrous gluconate 07/24/2020-allergic/infusion reaction with chest and back pain, diarrhea, nausea/vomiting, chills, presyncope, hypotension Feraheme  08/14/2020 and 08/21/2020-tolerated well Hiatal hernia Family history of colon and bladder cancer Transverse colon tubular adenoma on the colonoscopy 01/03/2020 Hypertension Glaucoma Admission 02/12/2022 with choledocholithiasis, status post ERCP and stone removal Left chest wall masses on CT 01/13/2023-CT-guided biopsy of left chest wall mass  02/21/2023  Hypercalcemia 03/07/2023-Zometa  and IV fluids Echocardiogram 03/10/2023-LVEF 65%, normal left and right ventricular function      Disposition: Ms. Tomkiewicz has completed 6 cycles of R-mini CHOP.  She is in clinical remission from non-Hodgkin's lymphoma.  A Port-A-Cath remains in place.  She will return for an office visit and Port-A-Cath flush in approximately 7 weeks.  We will discuss Port-A-Cath removal at the next office visit.  Arley Hof, MD  07/29/2023  2:43 PM

## 2023-07-30 ENCOUNTER — Other Ambulatory Visit: Payer: Self-pay

## 2023-08-28 ENCOUNTER — Other Ambulatory Visit: Payer: Self-pay | Admitting: Gastroenterology

## 2023-09-17 ENCOUNTER — Other Ambulatory Visit: Payer: Self-pay

## 2023-09-19 ENCOUNTER — Inpatient Hospital Stay: Payer: Medicare Other | Admitting: Oncology

## 2023-09-19 ENCOUNTER — Inpatient Hospital Stay: Payer: Medicare Other | Attending: Oncology

## 2023-09-19 VITALS — BP 152/54 | HR 58 | Temp 98.1°F | Resp 18 | Ht 62.0 in | Wt 142.8 lb

## 2023-09-19 DIAGNOSIS — Z8052 Family history of malignant neoplasm of bladder: Secondary | ICD-10-CM | POA: Diagnosis not present

## 2023-09-19 DIAGNOSIS — H409 Unspecified glaucoma: Secondary | ICD-10-CM | POA: Diagnosis not present

## 2023-09-19 DIAGNOSIS — C859A Non-Hodgkin lymphoma, unspecified, in remission: Secondary | ICD-10-CM | POA: Insufficient documentation

## 2023-09-19 DIAGNOSIS — I10 Essential (primary) hypertension: Secondary | ICD-10-CM | POA: Diagnosis not present

## 2023-09-19 DIAGNOSIS — D508 Other iron deficiency anemias: Secondary | ICD-10-CM | POA: Insufficient documentation

## 2023-09-19 DIAGNOSIS — C8590 Non-Hodgkin lymphoma, unspecified, unspecified site: Secondary | ICD-10-CM

## 2023-09-19 DIAGNOSIS — Z800 Family history of malignant neoplasm of digestive organs: Secondary | ICD-10-CM | POA: Diagnosis not present

## 2023-09-19 DIAGNOSIS — D500 Iron deficiency anemia secondary to blood loss (chronic): Secondary | ICD-10-CM

## 2023-09-19 MED ORDER — SODIUM CHLORIDE 0.9% FLUSH
10.0000 mL | Freq: Once | INTRAVENOUS | Status: AC | PRN
Start: 2023-09-19 — End: 2023-09-19
  Administered 2023-09-19: 10 mL

## 2023-09-19 MED ORDER — HEPARIN SOD (PORK) LOCK FLUSH 100 UNIT/ML IV SOLN
500.0000 [IU] | Freq: Once | INTRAVENOUS | Status: AC | PRN
  Administered 2023-09-19: 500 [IU]

## 2023-09-19 NOTE — Progress Notes (Signed)
 [facility] OFFICE PROGRESS NOTE   Diagnosis: Non-Hodgkin's lymphoma  INTERVAL HISTORY:   Ms. Amber Park returns as scheduled.  She feels well.  No discomfort at the left chest or axilla.  No new complaint.  Objective:  Vital signs in last 24 hours:  Blood pressure (!) 152/54, pulse (!) 58, temperature 98.1 F (36.7 C), temperature source Temporal, resp. rate 18, height 5\' 2"  (1.575 m), weight 142 lb 12.8 oz (64.8 kg), SpO2 98%.     Lymphatics: No cervical, supraclavicular, axillary, or inguinal nodes Resp: Lungs clear bilaterally Cardio: Regular rate and rhythm GI: No hepatosplenomegaly Vascular: No leg edema Musculoskeletal: No mass at the left axilla  Portacath/PICC-without erythema  Lab Results:  Lab Results  Component Value Date   WBC 7.9 07/29/2023   HGB 12.4 07/29/2023   HCT 38.0 07/29/2023   MCV 83.5 07/29/2023   PLT 331 07/29/2023   NEUTROABS 5.9 07/29/2023    CMP  Lab Results  Component Value Date   NA 138 07/29/2023   K 4.8 07/29/2023   CL 104 07/29/2023   CO2 25 07/29/2023   GLUCOSE 87 07/29/2023   BUN 16 07/29/2023   CREATININE 0.93 07/29/2023   CALCIUM 9.3 07/29/2023   PROT 6.8 07/29/2023   ALBUMIN 4.2 07/29/2023   AST 24 07/29/2023   ALT 10 07/29/2023   ALKPHOS 73 07/29/2023   BILITOT 0.5 07/29/2023   GFRNONAA 56 (L) 07/29/2023   GFRAA 60 (L) 02/13/2020    Lab Results  Component Value Date   CEA1 1.78 12/04/2020   CEA 1.21 07/29/2023    Medications: I have reviewed the patient's current medications.   Assessment/Plan: Colon cancer-stage IIIb Cecal mass on colonoscopy 01/03/2020, biopsy confirmed adenocarcinoma CTs 01/11/2020-cecal mass, prominent subcentimeter mesenteric nodes adjacent to the cecum, multiple tiny pulmonary nodules-metastases not favored Laparoscopic right colectomy 02/10/2020,pT3pN1a, grade 2-grade 3, 1/27 lymph nodes, 1 tumor deposit, MSI-high, loss of MLH1 and PMS 2 expression; MLH1 methylation  detected CTs 02/05/2021-no evidence of recurrent disease, stable tiny upper lobe pulmonary nodules considered benign CTs 02/12/2022-nonspecific lymphadenopathy in the mesentery with mild mesenteric stranding, largest nodes measure up to 8 mm with no evidence of metastatic disease in the chest CTs 01/13/2023-new pleural-based masses in the lateral left upper chest with destruction of the left anterior second rib, no other evidence of metastatic disease CT-guided biopsy of left chest wall mass 02/21/2023-B-cell lymphoma with a high Ki-67, Bcl-2 positive, negative for double hit or triple hit lymphoma, large B-cell lymphoma per communication with the pathologist PET 03/11/2023-intense hypermetabolic activity associated with left chest wall mass, no evidence of additional sites of lymphoma, no evidence of recurrent colon cancer Cycle 1R-mini CHOP 03/14/2023 Cycle 2R-mini CHOP 04/07/2023 Cycle 3R-mini CHOP 04/28/2023 Cycle 4R-mini CHOP 05/11/2023, vincristine held secondary to neuropathy symptoms PET 06/05/2023-no residual left chest wall mass, remaining metabolic activity at the superior lateral left chest wall SUV 4.0 (previously 30.1), Deauville 3 Cycle 5R-mini CHOP 06/16/2023, vincristine held due to neuropathy symptoms, Udenyca Cycle 6R-mini CHOP 07/07/2023, vincristine held due to neuropathy, Udenyca 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 and history of AVMs/Cameron's erosions Ferrous gluconate 07/24/2020-allergic/infusion reaction with chest and back pain, diarrhea, nausea/vomiting, chills, presyncope, hypotension Feraheme 08/14/2020 and 08/21/2020-tolerated well Hiatal hernia Family history of colon and bladder cancer Transverse colon tubular adenoma on the colonoscopy 01/03/2020 Hypertension Glaucoma Admission 02/12/2022 with choledocholithiasis, status post ERCP and stone removal Left chest wall masses on CT 01/13/2023-CT-guided biopsy of left chest wall mass 02/21/2023 Hypercalcemia 03/07/2023-Zometa and  IV  fluids Echocardiogram 03/10/2023-LVEF 65%, normal left and right ventricular function       Disposition: Amber Park is in clinical remission from lymphoma.  She will return for an office visit and Port-A-Cath flush in 7 weeks.  We will consider referring her for Port-A-Cath removal after the next office visit.  Thornton Papas, MD  09/19/2023  12:34 PM

## 2023-09-20 ENCOUNTER — Other Ambulatory Visit: Payer: Self-pay

## 2023-10-09 ENCOUNTER — Other Ambulatory Visit: Payer: Self-pay

## 2023-10-15 DIAGNOSIS — K080 Exfoliation of teeth due to systemic causes: Secondary | ICD-10-CM | POA: Diagnosis not present

## 2023-11-10 ENCOUNTER — Inpatient Hospital Stay: Payer: Medicare Other | Attending: Oncology

## 2023-11-10 ENCOUNTER — Inpatient Hospital Stay: Payer: Medicare Other | Admitting: Oncology

## 2023-11-10 VITALS — BP 157/69 | HR 69 | Temp 98.1°F | Resp 18 | Ht 62.0 in | Wt 144.0 lb

## 2023-11-10 DIAGNOSIS — C8590 Non-Hodgkin lymphoma, unspecified, unspecified site: Secondary | ICD-10-CM

## 2023-11-10 DIAGNOSIS — Z79899 Other long term (current) drug therapy: Secondary | ICD-10-CM | POA: Diagnosis not present

## 2023-11-10 DIAGNOSIS — D509 Iron deficiency anemia, unspecified: Secondary | ICD-10-CM | POA: Insufficient documentation

## 2023-11-10 DIAGNOSIS — C189 Malignant neoplasm of colon, unspecified: Secondary | ICD-10-CM | POA: Diagnosis not present

## 2023-11-10 DIAGNOSIS — I10 Essential (primary) hypertension: Secondary | ICD-10-CM | POA: Insufficient documentation

## 2023-11-10 DIAGNOSIS — Z95828 Presence of other vascular implants and grafts: Secondary | ICD-10-CM

## 2023-11-10 DIAGNOSIS — C859A Non-Hodgkin lymphoma, unspecified, in remission: Secondary | ICD-10-CM | POA: Diagnosis not present

## 2023-11-10 DIAGNOSIS — H409 Unspecified glaucoma: Secondary | ICD-10-CM | POA: Diagnosis not present

## 2023-11-10 MED ORDER — HEPARIN SOD (PORK) LOCK FLUSH 100 UNIT/ML IV SOLN
500.0000 [IU] | Freq: Once | INTRAVENOUS | Status: AC
  Administered 2023-11-10: 500 [IU] via INTRAVENOUS

## 2023-11-10 MED ORDER — SODIUM CHLORIDE 0.9% FLUSH
10.0000 mL | INTRAVENOUS | Status: DC | PRN
Start: 2023-11-10 — End: 2023-11-10
  Administered 2023-11-10: 10 mL via INTRAVENOUS

## 2023-11-10 NOTE — Patient Instructions (Signed)

## 2023-11-10 NOTE — Progress Notes (Signed)
 [facility] OFFICE PROGRESS NOTE   Diagnosis: Non-Hodgkin's lymphoma  INTERVAL HISTORY:   Amber Park returns as scheduled.  She feels well.  Good appetite.  No palpable lymph nodes.  She has occasional discomfort in the left axilla.  No mass.  She reports potassium pills are intact in her stool.  Objective:  Vital signs in last 24 hours:  Blood pressure (!) 157/69, pulse 69, temperature 98.1 F (36.7 C), temperature source Temporal, resp. rate 18, height 5\' 2"  (1.575 m), weight 144 lb (65.3 kg), SpO2 100%.     Lymphatics: No cervical, supraclavicular, axillary, or inguinal nodes.  No mass at the left axilla or pectoral/subpectoral area Resp: Lungs clear bilaterally Cardio: Regular rate and rhythm GI: No hepatosplenomegaly Vascular: No leg edema   Portacath/PICC-without erythema  Lab Results:  Lab Results  Component Value Date   WBC 7.9 07/29/2023   HGB 12.4 07/29/2023   HCT 38.0 07/29/2023   MCV 83.5 07/29/2023   PLT 331 07/29/2023   NEUTROABS 5.9 07/29/2023    CMP  Lab Results  Component Value Date   NA 138 07/29/2023   K 4.8 07/29/2023   CL 104 07/29/2023   CO2 25 07/29/2023   GLUCOSE 87 07/29/2023   BUN 16 07/29/2023   CREATININE 0.93 07/29/2023   CALCIUM 9.3 07/29/2023   PROT 6.8 07/29/2023   ALBUMIN  4.2 07/29/2023   AST 24 07/29/2023   ALT 10 07/29/2023   ALKPHOS 73 07/29/2023   BILITOT 0.5 07/29/2023   GFRNONAA 56 (L) 07/29/2023   GFRAA 60 (L) 02/13/2020    Lab Results  Component Value Date   CEA1 1.78 12/04/2020   CEA 1.21 07/29/2023     Medications: I have reviewed the patient's current medications.   Assessment/Plan:  Colon cancer-stage IIIb Cecal mass on colonoscopy 01/03/2020, biopsy confirmed adenocarcinoma CTs 01/11/2020-cecal mass, prominent subcentimeter mesenteric nodes adjacent to the cecum, multiple tiny pulmonary nodules-metastases not favored Laparoscopic right colectomy 02/10/2020,pT3pN1a, grade 2-grade 3,  1/27 lymph nodes, 1 tumor deposit, MSI-high, loss of MLH1 and PMS 2 expression; MLH1 methylation detected CTs 02/05/2021-no evidence of recurrent disease, stable tiny upper lobe pulmonary nodules considered benign CTs 02/12/2022-nonspecific lymphadenopathy in the mesentery with mild mesenteric stranding, largest nodes measure up to 8 mm with no evidence of metastatic disease in the chest CTs 01/13/2023-new pleural-based masses in the lateral left upper chest with destruction of the left anterior second rib, no other evidence of metastatic disease CT-guided biopsy of left chest wall mass 02/21/2023-B-cell lymphoma with a high Ki-67, Bcl-2 positive, negative for double hit or triple hit lymphoma, large B-cell lymphoma per communication with the pathologist PET 03/11/2023-intense hypermetabolic activity associated with left chest wall mass, no evidence of additional sites of lymphoma, no evidence of recurrent colon cancer Cycle 1R-mini CHOP 03/14/2023 Cycle 2R-mini CHOP 04/07/2023 Cycle 3R-mini CHOP 04/28/2023 Cycle 4R-mini CHOP 05/11/2023, vincristine  held secondary to neuropathy symptoms PET 06/05/2023-no residual left chest wall mass, remaining metabolic activity at the superior lateral left chest wall SUV 4.0 (previously 30.1), Deauville 3 Cycle 5R-mini CHOP 06/16/2023, vincristine  held due to neuropathy symptoms, Udenyca  Cycle 6R-mini CHOP 07/07/2023, vincristine  held due to neuropathy, Udenyca  Iron  deficiency anemia secondary to #1 and history of AVMs/Cameron's erosions Ferrous gluconate 07/24/2020-allergic/infusion reaction with chest and back pain, diarrhea, nausea/vomiting, chills, presyncope, hypotension Feraheme  08/14/2020 and 08/21/2020-tolerated well Hiatal hernia Family history of colon and bladder cancer Transverse colon tubular adenoma on the colonoscopy 01/03/2020 Hypertension Glaucoma Admission 02/12/2022 with choledocholithiasis, status post ERCP and stone  removal Left chest wall masses on CT  01/13/2023-CT-guided biopsy of left chest wall mass 02/21/2023 Hypercalcemia 03/07/2023-Zometa  and IV fluids Echocardiogram 03/10/2023-LVEF 65%, normal left and right ventricular function       Disposition: Amber Park is in clinical remission from lymphoma.  She will return for a Port-A-Cath flush in 6 weeks and an office visit in 12 weeks.  We will consider removing the Port-A-Cath after the next office visit.  We will check a potassium level when she is here for the Port-A-Cath flush in 6 weeks.  Coni Deep, MD  11/10/2023  12:16 PM

## 2023-11-10 NOTE — Progress Notes (Deleted)
 [facility] OFFICE PROGRESS NOTE   Diagnosis:   INTERVAL HISTORY:   ***  Objective:  Vital signs in last 24 hours:  Blood pressure (!) 157/69, pulse 69, temperature 98.1 F (36.7 C), temperature source Temporal, resp. rate 18, height 5\' 2"  (1.575 m), weight 144 lb (65.3 kg), SpO2 100%.    HEENT: *** Lymphatics: *** Resp: *** Cardio: *** GI: *** Vascular: *** Neuro:***  Skin:***   Portacath/PICC-without erythema  Lab Results:  Lab Results  Component Value Date   WBC 7.9 07/29/2023   HGB 12.4 07/29/2023   HCT 38.0 07/29/2023   MCV 83.5 07/29/2023   PLT 331 07/29/2023   NEUTROABS 5.9 07/29/2023    CMP  Lab Results  Component Value Date   NA 138 07/29/2023   K 4.8 07/29/2023   CL 104 07/29/2023   CO2 25 07/29/2023   GLUCOSE 87 07/29/2023   BUN 16 07/29/2023   CREATININE 0.93 07/29/2023   CALCIUM 9.3 07/29/2023   PROT 6.8 07/29/2023   ALBUMIN  4.2 07/29/2023   AST 24 07/29/2023   ALT 10 07/29/2023   ALKPHOS 73 07/29/2023   BILITOT 0.5 07/29/2023   GFRNONAA 56 (L) 07/29/2023   GFRAA 60 (L) 02/13/2020    Lab Results  Component Value Date   CEA1 1.78 12/04/2020   CEA 1.21 07/29/2023    Lab Results  Component Value Date   INR 1.0 02/21/2023   LABPROT 13.0 02/21/2023    Imaging:  No results found.  Medications: I have reviewed the patient's current medications.   Assessment/Plan: Colon cancer-stage IIIb Cecal mass on colonoscopy 01/03/2020, biopsy confirmed adenocarcinoma CTs 01/11/2020-cecal mass, prominent subcentimeter mesenteric nodes adjacent to the cecum, multiple tiny pulmonary nodules-metastases not favored Laparoscopic right colectomy 02/10/2020,pT3pN1a, grade 2-grade 3, 1/27 lymph nodes, 1 tumor deposit, MSI-high, loss of MLH1 and PMS 2 expression; MLH1 methylation detected CTs 02/05/2021-no evidence of recurrent disease, stable tiny upper lobe pulmonary nodules considered benign CTs 02/12/2022-nonspecific  lymphadenopathy in the mesentery with mild mesenteric stranding, largest nodes measure up to 8 mm with no evidence of metastatic disease in the chest CTs 01/13/2023-new pleural-based masses in the lateral left upper chest with destruction of the left anterior second rib, no other evidence of metastatic disease CT-guided biopsy of left chest wall mass 02/21/2023-B-cell lymphoma with a high Ki-67, Bcl-2 positive, negative for double hit or triple hit lymphoma, large B-cell lymphoma per communication with the pathologist PET 03/11/2023-intense hypermetabolic activity associated with left chest wall mass, no evidence of additional sites of lymphoma, no evidence of recurrent colon cancer Cycle 1R-mini CHOP 03/14/2023 Cycle 2R-mini CHOP 04/07/2023 Cycle 3R-mini CHOP 04/28/2023 Cycle 4R-mini CHOP 05/11/2023, vincristine  held secondary to neuropathy symptoms PET 06/05/2023-no residual left chest wall mass, remaining metabolic activity at the superior lateral left chest wall SUV 4.0 (previously 30.1), Deauville 3 Cycle 5R-mini CHOP 06/16/2023, vincristine  held due to neuropathy symptoms, Udenyca  Cycle 6R-mini CHOP 07/07/2023, vincristine  held due to neuropathy, Udenyca  Iron  deficiency anemia secondary to #1 and history of AVMs/Cameron's erosions Ferrous gluconate 07/24/2020-allergic/infusion reaction with chest and back pain, diarrhea, nausea/vomiting, chills, presyncope, hypotension Feraheme  08/14/2020 and 08/21/2020-tolerated well Hiatal hernia Family history of colon and bladder cancer Transverse colon tubular adenoma on the colonoscopy 01/03/2020 Hypertension Glaucoma Admission 02/12/2022 with choledocholithiasis, status post ERCP and stone removal Left chest wall masses on CT 01/13/2023-CT-guided biopsy of left chest wall mass 02/21/2023 Hypercalcemia 03/07/2023-Zometa  and IV fluids Echocardiogram 03/10/2023-LVEF 65%, normal left and right ventricular function       Disposition: ***  Coni Deep,  MD  11/10/2023  11:55 AM

## 2023-11-12 ENCOUNTER — Telehealth: Payer: Self-pay | Admitting: Oncology

## 2023-11-12 NOTE — Telephone Encounter (Signed)
 Contacted pt to schedule an appt. Unable to reach via phone, voicemail was left.    Follow-Up Information  Follow-up disposition: Return for Port/Labs, office.  Check out comments: Portacath/labs week of 6/2 Office and portacath flush week of 7/14

## 2023-11-12 NOTE — Telephone Encounter (Signed)
 Patient has been scheduled. Aware of appt date and time.

## 2023-11-13 ENCOUNTER — Other Ambulatory Visit: Payer: Self-pay

## 2023-11-25 ENCOUNTER — Other Ambulatory Visit: Payer: Self-pay | Admitting: Gastroenterology

## 2023-12-22 ENCOUNTER — Inpatient Hospital Stay: Attending: Oncology

## 2023-12-22 ENCOUNTER — Inpatient Hospital Stay

## 2023-12-22 DIAGNOSIS — C859A Non-Hodgkin lymphoma, unspecified, in remission: Secondary | ICD-10-CM | POA: Diagnosis not present

## 2023-12-22 DIAGNOSIS — Z79899 Other long term (current) drug therapy: Secondary | ICD-10-CM | POA: Diagnosis not present

## 2023-12-22 DIAGNOSIS — C8590 Non-Hodgkin lymphoma, unspecified, unspecified site: Secondary | ICD-10-CM

## 2023-12-22 LAB — BASIC METABOLIC PANEL - CANCER CENTER ONLY
Anion gap: 14 (ref 5–15)
BUN: 21 mg/dL (ref 8–23)
CO2: 25 mmol/L (ref 22–32)
Calcium: 9.9 mg/dL (ref 8.9–10.3)
Chloride: 97 mmol/L — ABNORMAL LOW (ref 98–111)
Creatinine: 1.12 mg/dL — ABNORMAL HIGH (ref 0.44–1.00)
GFR, Estimated: 45 mL/min — ABNORMAL LOW (ref >60)
Glucose, Bld: 86 mg/dL (ref 70–99)
Potassium: 4 mmol/L (ref 3.5–5.1)
Sodium: 136 mmol/L (ref 135–145)

## 2023-12-22 LAB — MAGNESIUM: Magnesium: 1.6 mg/dL — ABNORMAL LOW (ref 1.7–2.4)

## 2023-12-24 ENCOUNTER — Other Ambulatory Visit: Payer: Self-pay

## 2023-12-31 DIAGNOSIS — H40053 Ocular hypertension, bilateral: Secondary | ICD-10-CM | POA: Diagnosis not present

## 2023-12-31 DIAGNOSIS — Z961 Presence of intraocular lens: Secondary | ICD-10-CM | POA: Diagnosis not present

## 2024-01-06 DIAGNOSIS — I129 Hypertensive chronic kidney disease with stage 1 through stage 4 chronic kidney disease, or unspecified chronic kidney disease: Secondary | ICD-10-CM | POA: Diagnosis not present

## 2024-01-06 DIAGNOSIS — N1832 Chronic kidney disease, stage 3b: Secondary | ICD-10-CM | POA: Diagnosis not present

## 2024-01-06 DIAGNOSIS — D500 Iron deficiency anemia secondary to blood loss (chronic): Secondary | ICD-10-CM | POA: Diagnosis not present

## 2024-01-06 DIAGNOSIS — C8590 Non-Hodgkin lymphoma, unspecified, unspecified site: Secondary | ICD-10-CM | POA: Diagnosis not present

## 2024-01-06 DIAGNOSIS — Z0000 Encounter for general adult medical examination without abnormal findings: Secondary | ICD-10-CM | POA: Diagnosis not present

## 2024-01-20 DIAGNOSIS — I129 Hypertensive chronic kidney disease with stage 1 through stage 4 chronic kidney disease, or unspecified chronic kidney disease: Secondary | ICD-10-CM | POA: Diagnosis not present

## 2024-02-03 ENCOUNTER — Inpatient Hospital Stay: Attending: Oncology | Admitting: Oncology

## 2024-02-03 ENCOUNTER — Inpatient Hospital Stay: Attending: Oncology

## 2024-02-03 ENCOUNTER — Ambulatory Visit: Payer: Self-pay | Admitting: Oncology

## 2024-02-03 ENCOUNTER — Inpatient Hospital Stay

## 2024-02-03 VITALS — BP 140/55 | HR 57 | Temp 97.8°F | Resp 18 | Ht 62.0 in | Wt 149.1 lb

## 2024-02-03 DIAGNOSIS — D509 Iron deficiency anemia, unspecified: Secondary | ICD-10-CM | POA: Insufficient documentation

## 2024-02-03 DIAGNOSIS — C8590 Non-Hodgkin lymphoma, unspecified, unspecified site: Secondary | ICD-10-CM

## 2024-02-03 DIAGNOSIS — Z95828 Presence of other vascular implants and grafts: Secondary | ICD-10-CM

## 2024-02-03 DIAGNOSIS — C859A Non-Hodgkin lymphoma, unspecified, in remission: Secondary | ICD-10-CM | POA: Diagnosis not present

## 2024-02-03 DIAGNOSIS — D500 Iron deficiency anemia secondary to blood loss (chronic): Secondary | ICD-10-CM

## 2024-02-03 LAB — CBC WITH DIFFERENTIAL (CANCER CENTER ONLY)
Abs Immature Granulocytes: 0.03 K/uL (ref 0.00–0.07)
Basophils Absolute: 0.1 K/uL (ref 0.0–0.1)
Basophils Relative: 1 %
Eosinophils Absolute: 0.1 K/uL (ref 0.0–0.5)
Eosinophils Relative: 1 %
HCT: 38.9 % (ref 36.0–46.0)
Hemoglobin: 12.5 g/dL (ref 12.0–15.0)
Immature Granulocytes: 0 %
Lymphocytes Relative: 14 %
Lymphs Abs: 1.1 K/uL (ref 0.7–4.0)
MCH: 25.9 pg — ABNORMAL LOW (ref 26.0–34.0)
MCHC: 32.1 g/dL (ref 30.0–36.0)
MCV: 80.5 fL (ref 80.0–100.0)
Monocytes Absolute: 0.8 K/uL (ref 0.1–1.0)
Monocytes Relative: 10 %
Neutro Abs: 5.9 K/uL (ref 1.7–7.7)
Neutrophils Relative %: 74 %
Platelet Count: 278 K/uL (ref 150–400)
RBC: 4.83 MIL/uL (ref 3.87–5.11)
RDW: 14.4 % (ref 11.5–15.5)
WBC Count: 8 K/uL (ref 4.0–10.5)
nRBC: 0 % (ref 0.0–0.2)

## 2024-02-03 LAB — FERRITIN: Ferritin: 15 ng/mL (ref 11–307)

## 2024-02-03 MED ORDER — SODIUM CHLORIDE 0.9% FLUSH
10.0000 mL | Freq: Once | INTRAVENOUS | Status: AC
  Administered 2024-02-03: 10 mL

## 2024-02-03 MED ORDER — HEPARIN SOD (PORK) LOCK FLUSH 100 UNIT/ML IV SOLN
500.0000 [IU] | Freq: Once | INTRAVENOUS | Status: AC
  Administered 2024-02-03: 500 [IU] via INTRAVENOUS

## 2024-02-03 NOTE — Progress Notes (Signed)
 Amber Park   Diagnosis: Non-Hodgkin's lymphoma  INTERVAL HISTORY:   Amber Park returns as scheduled.  She reports chronic malaise.  Good appetite.  No palpable mass at the left chest wall or axilla.  She has occasional discomfort at the left axilla.  She relates this to a bra wire.  She plays cards weekly.  She continues to live independently.  Objective:  Vital signs in last 24 hours:  Blood pressure (!) 140/55, pulse (!) 57, temperature 97.8 F (36.6 C), temperature source Temporal, resp. rate 18, height 5' 2 (1.575 m), weight 149 lb 1.6 oz (67.6 kg), SpO2 98%.     Lymphatics: No cervical, supraclavicular, axillary, or inguinal nodes Resp: Lungs clear bilaterally Cardio: Regular rate and rhythm with premature beats GI: No hepatosplenomegaly Vascular: No leg edema    Portacath/PICC-without erythema  Lab Results:  Lab Results  Component Value Date   WBC 7.9 07/29/2023   HGB 12.4 07/29/2023   HCT 38.0 07/29/2023   MCV 83.5 07/29/2023   PLT 331 07/29/2023   NEUTROABS 5.9 07/29/2023    CMP  Lab Results  Component Value Date   NA 136 12/22/2023   K 4.0 12/22/2023   CL 97 (L) 12/22/2023   CO2 25 12/22/2023   GLUCOSE 86 12/22/2023   BUN 21 12/22/2023   CREATININE 1.12 (H) 12/22/2023   CALCIUM 9.9 12/22/2023   PROT 6.8 07/29/2023   ALBUMIN  4.2 07/29/2023   AST 24 07/29/2023   ALT 10 07/29/2023   ALKPHOS 73 07/29/2023   BILITOT 0.5 07/29/2023   GFRNONAA 45 (L) 12/22/2023   GFRAA 60 (L) 02/13/2020    Lab Results  Component Value Date   CEA1 1.78 12/04/2020   CEA 1.21 07/29/2023    Medications: I have reviewed the patient's current medications.   Assessment/Plan:  Colon cancer-stage IIIb Cecal mass on colonoscopy 01/03/2020, biopsy confirmed adenocarcinoma CTs 01/11/2020-cecal mass, prominent subcentimeter mesenteric nodes adjacent to the cecum, multiple tiny pulmonary nodules-metastases not favored Laparoscopic  right colectomy 02/10/2020,pT3pN1a, grade 2-grade 3, 1/27 lymph nodes, 1 tumor deposit, MSI-high, loss of MLH1 and PMS 2 expression; MLH1 methylation detected CTs 02/05/2021-no evidence of recurrent disease, stable tiny upper lobe pulmonary nodules considered benign CTs 02/12/2022-nonspecific lymphadenopathy in the mesentery with mild mesenteric stranding, largest nodes measure up to 8 mm with no evidence of metastatic disease in the chest CTs 01/13/2023-new pleural-based masses in the lateral left upper chest with destruction of the left anterior second rib, no other evidence of metastatic disease CT-guided biopsy of left chest wall mass 02/21/2023-B-cell lymphoma with a high Ki-67, Bcl-2 positive, negative for double hit or triple hit lymphoma, large B-cell lymphoma per communication with the pathologist PET 03/11/2023-intense hypermetabolic activity associated with left chest wall mass, no evidence of additional sites of lymphoma, no evidence of recurrent colon cancer Cycle 1R-mini CHOP 03/14/2023 Cycle 2R-mini CHOP 04/07/2023 Cycle 3R-mini CHOP 04/28/2023 Cycle 4R-mini CHOP 05/11/2023, vincristine  held secondary to neuropathy symptoms PET 06/05/2023-no residual left chest wall mass, remaining metabolic activity at the superior lateral left chest wall SUV 4.0 (previously 30.1), Deauville 3 Cycle 5R-mini CHOP 06/16/2023, vincristine  held due to neuropathy symptoms, Udenyca  Cycle 6R-mini CHOP 07/07/2023, vincristine  held due to neuropathy, Udenyca  Iron  deficiency anemia secondary to #1 and history of AVMs/Cameron's erosions Ferrous gluconate 07/24/2020-allergic/infusion reaction with chest and back pain, diarrhea, nausea/vomiting, chills, presyncope, hypotension Feraheme  08/14/2020 and 08/21/2020-tolerated well Hiatal hernia Family history of colon and bladder cancer Transverse colon tubular adenoma on the colonoscopy 01/03/2020  Hypertension Glaucoma Admission 02/12/2022 with choledocholithiasis, status post  ERCP and stone removal Left chest wall masses on CT 01/13/2023-CT-guided biopsy of left chest wall mass 02/21/2023 Hypercalcemia 03/07/2023-Zometa  and IV fluids Echocardiogram 03/10/2023-LVEF 65%, normal left and right ventricular function       Disposition: Amber Park is in clinical remission from lymphoma.  She will return for an office visit in 3 months.  She would like to have the hemoglobin and ferritin checked today.  She is concerned the malaise could be related to anemia.  A Port-A-Cath remains in place.  She will return for a Port-A-Cath flush in 6 weeks and an office visit in 12 weeks.  We will arrange for Port-A-Cath removal if she remains in remission when she returns in 12 weeks.  She will discuss her medical regimen with Dr. Dayna.  The metoprolol  could be contributing to malaise.  Arley Hof, MD  02/03/2024  12:37 PM

## 2024-02-05 ENCOUNTER — Other Ambulatory Visit: Payer: Self-pay

## 2024-02-06 ENCOUNTER — Encounter: Payer: Self-pay | Admitting: Oncology

## 2024-02-06 ENCOUNTER — Encounter: Payer: Self-pay | Admitting: Nurse Practitioner

## 2024-02-06 MED ORDER — FERROUS SULFATE 325 (65 FE) MG PO TBEC: 325.0000 mg | DELAYED_RELEASE_TABLET | Freq: Every day | ORAL | Status: DC

## 2024-02-06 NOTE — Telephone Encounter (Signed)
 Daughter called earlier and requested we call Amber Park and leave a voice mail re: labs. Called her and left the following message: Hemogglobin and iron  are at the low end of the normal range, repeat CBC and ferritin next Port-A-Cath flush, I doubt this explains her malaise, but it is possible, start ferrous sulfate  325 mg daily Call for increased malaise or shortness of breath, ask primary physician if metoprolol  could be contributing to the malaise Also sent via Mychart.

## 2024-02-26 ENCOUNTER — Other Ambulatory Visit: Payer: Self-pay | Admitting: Gastroenterology

## 2024-03-17 ENCOUNTER — Ambulatory Visit: Payer: Self-pay | Admitting: Oncology

## 2024-03-17 ENCOUNTER — Inpatient Hospital Stay: Attending: Oncology

## 2024-03-17 ENCOUNTER — Inpatient Hospital Stay

## 2024-03-17 ENCOUNTER — Encounter

## 2024-03-17 DIAGNOSIS — D500 Iron deficiency anemia secondary to blood loss (chronic): Secondary | ICD-10-CM | POA: Insufficient documentation

## 2024-03-17 DIAGNOSIS — Z79899 Other long term (current) drug therapy: Secondary | ICD-10-CM | POA: Insufficient documentation

## 2024-03-17 DIAGNOSIS — C859A Non-Hodgkin lymphoma, unspecified, in remission: Secondary | ICD-10-CM | POA: Insufficient documentation

## 2024-03-17 LAB — CBC WITH DIFFERENTIAL (CANCER CENTER ONLY)
Abs Immature Granulocytes: 0.04 K/uL (ref 0.00–0.07)
Basophils Absolute: 0.1 K/uL (ref 0.0–0.1)
Basophils Relative: 1 %
Eosinophils Absolute: 0.1 K/uL (ref 0.0–0.5)
Eosinophils Relative: 2 %
HCT: 37.3 % (ref 36.0–46.0)
Hemoglobin: 12.2 g/dL (ref 12.0–15.0)
Immature Granulocytes: 1 %
Lymphocytes Relative: 14 %
Lymphs Abs: 1.1 K/uL (ref 0.7–4.0)
MCH: 26 pg (ref 26.0–34.0)
MCHC: 32.7 g/dL (ref 30.0–36.0)
MCV: 79.5 fL — ABNORMAL LOW (ref 80.0–100.0)
Monocytes Absolute: 0.8 K/uL (ref 0.1–1.0)
Monocytes Relative: 10 %
Neutro Abs: 5.9 K/uL (ref 1.7–7.7)
Neutrophils Relative %: 72 %
Platelet Count: 263 K/uL (ref 150–400)
RBC: 4.69 MIL/uL (ref 3.87–5.11)
RDW: 13.9 % (ref 11.5–15.5)
WBC Count: 8 K/uL (ref 4.0–10.5)
nRBC: 0 % (ref 0.0–0.2)

## 2024-03-17 LAB — FERRITIN: Ferritin: 15 ng/mL (ref 11–307)

## 2024-03-17 NOTE — Patient Instructions (Signed)

## 2024-03-18 ENCOUNTER — Encounter: Payer: Self-pay | Admitting: Oncology

## 2024-03-18 ENCOUNTER — Encounter: Payer: Self-pay | Admitting: Nurse Practitioner

## 2024-03-18 NOTE — Telephone Encounter (Addendum)
 Patient voiced understanding, will continue taking oral iron  325 mgs daily, no further questions at this time.

## 2024-03-18 NOTE — Telephone Encounter (Signed)
-----   Message from Arley Hof sent at 03/17/2024  4:23 PM EDT ----- Please call patient, the hemoglobin is normal, and the iron  level was at the low end of the normal range, continue iron , follow-up as scheduled  ----- Message ----- From: Rebecka, Lab In [place] Sent: 03/17/2024   1:33 PM EDT To: Arley KATHEE Hof, MD

## 2024-03-28 ENCOUNTER — Other Ambulatory Visit: Payer: Self-pay

## 2024-04-27 ENCOUNTER — Inpatient Hospital Stay

## 2024-04-27 ENCOUNTER — Inpatient Hospital Stay: Attending: Oncology | Admitting: Oncology

## 2024-04-27 ENCOUNTER — Inpatient Hospital Stay: Attending: Oncology

## 2024-04-27 VITALS — BP 138/53 | HR 66 | Temp 97.8°F | Resp 18 | Ht 62.0 in | Wt 149.6 lb

## 2024-04-27 DIAGNOSIS — C8590 Non-Hodgkin lymphoma, unspecified, unspecified site: Secondary | ICD-10-CM

## 2024-04-27 DIAGNOSIS — Z23 Encounter for immunization: Secondary | ICD-10-CM

## 2024-04-27 DIAGNOSIS — K449 Diaphragmatic hernia without obstruction or gangrene: Secondary | ICD-10-CM | POA: Diagnosis not present

## 2024-04-27 DIAGNOSIS — Z8052 Family history of malignant neoplasm of bladder: Secondary | ICD-10-CM | POA: Diagnosis not present

## 2024-04-27 DIAGNOSIS — D509 Iron deficiency anemia, unspecified: Secondary | ICD-10-CM | POA: Insufficient documentation

## 2024-04-27 DIAGNOSIS — Z85038 Personal history of other malignant neoplasm of large intestine: Secondary | ICD-10-CM | POA: Diagnosis not present

## 2024-04-27 DIAGNOSIS — I10 Essential (primary) hypertension: Secondary | ICD-10-CM | POA: Diagnosis not present

## 2024-04-27 DIAGNOSIS — Z800 Family history of malignant neoplasm of digestive organs: Secondary | ICD-10-CM | POA: Insufficient documentation

## 2024-04-27 DIAGNOSIS — C859A Non-Hodgkin lymphoma, unspecified, in remission: Secondary | ICD-10-CM | POA: Insufficient documentation

## 2024-04-27 MED ORDER — INFLUENZA VAC SPLIT HIGH-DOSE 0.5 ML IM SUSY
0.5000 mL | PREFILLED_SYRINGE | Freq: Once | INTRAMUSCULAR | Status: AC
  Administered 2024-04-27: 0.5 mL via INTRAMUSCULAR
  Filled 2024-04-27: qty 0.5

## 2024-04-27 NOTE — Progress Notes (Signed)
 [facility] OFFICE PROGRESS NOTE   Diagnosis: Non-Hodgkin's lymphoma  INTERVAL HISTORY:   Amber Park returns as scheduled.  She has a good appetite.  No fever or night sweats.  No palpable lymph nodes.  She continues to have malaise. She is not taking iron . Objective:  Vital signs in last 24 hours:  Blood pressure (!) 138/53, pulse 66, temperature 97.8 F (36.6 C), resp. rate 18, height 5' 2 (1.575 m), weight 149 lb 9.6 oz (67.9 kg), SpO2 97%.  Lymphatics: No cervical, supraclavicular, axillary, or inguinal nodes Resp: Lungs clear bilaterally Cardio: Regular rate and rhythm GI: No hepatosplenomegaly Vascular: No leg edema  Portacath/PICC-without erythema  Lab Results:  Lab Results  Component Value Date   WBC 8.0 03/17/2024   HGB 12.2 03/17/2024   HCT 37.3 03/17/2024   MCV 79.5 (L) 03/17/2024   PLT 263 03/17/2024   NEUTROABS 5.9 03/17/2024    CMP  Lab Results  Component Value Date   NA 136 12/22/2023   K 4.0 12/22/2023   CL 97 (L) 12/22/2023   CO2 25 12/22/2023   GLUCOSE 86 12/22/2023   BUN 21 12/22/2023   CREATININE 1.12 (H) 12/22/2023   CALCIUM 9.9 12/22/2023   PROT 6.8 07/29/2023   ALBUMIN  4.2 07/29/2023   AST 24 07/29/2023   ALT 10 07/29/2023   ALKPHOS 73 07/29/2023   BILITOT 0.5 07/29/2023   GFRNONAA 45 (L) 12/22/2023   GFRAA 60 (L) 02/13/2020    Lab Results  Component Value Date   CEA1 1.78 12/04/2020   CEA 1.21 07/29/2023    Lab Results  Component Value Date   INR 1.0 02/21/2023   LABPROT 13.0 02/21/2023   Medications: I have reviewed the patient's current medications.   Assessment/Plan: Colon cancer-stage IIIb Cecal mass on colonoscopy 01/03/2020, biopsy confirmed adenocarcinoma CTs 01/11/2020-cecal mass, prominent subcentimeter mesenteric nodes adjacent to the cecum, multiple tiny pulmonary nodules-metastases not favored Laparoscopic right colectomy 02/10/2020,pT3pN1a, grade 2-grade 3, 1/27 lymph nodes, 1 tumor deposit,  MSI-high, loss of MLH1 and PMS 2 expression; MLH1 methylation detected CTs 02/05/2021-no evidence of recurrent disease, stable tiny upper lobe pulmonary nodules considered benign CTs 02/12/2022-nonspecific lymphadenopathy in the mesentery with mild mesenteric stranding, largest nodes measure up to 8 mm with no evidence of metastatic disease in the chest CTs 01/13/2023-new pleural-based masses in the lateral left upper chest with destruction of the left anterior second rib, no other evidence of metastatic disease CT-guided biopsy of left chest wall mass 02/21/2023-B-cell lymphoma with a high Ki-67, Bcl-2 positive, negative for double hit or triple hit lymphoma, large B-cell lymphoma per communication with the pathologist PET 03/11/2023-intense hypermetabolic activity associated with left chest wall mass, no evidence of additional sites of lymphoma, no evidence of recurrent colon cancer Cycle 1R-mini CHOP 03/14/2023 Cycle 2R-mini CHOP 04/07/2023 Cycle 3R-mini CHOP 04/28/2023 Cycle 4R-mini CHOP 05/11/2023, vincristine  held secondary to neuropathy symptoms PET 06/05/2023-no residual left chest wall mass, remaining metabolic activity at the superior lateral left chest wall SUV 4.0 (previously 30.1), Deauville 3 Cycle 5R-mini CHOP 06/16/2023, vincristine  held due to neuropathy symptoms, Udenyca  Cycle 6R-mini CHOP 07/07/2023, vincristine  held due to neuropathy, Udenyca  Iron  deficiency anemia secondary to #1 and history of AVMs/Cameron's erosions Ferrous gluconate 07/24/2020-allergic/infusion reaction with chest and back pain, diarrhea, nausea/vomiting, chills, presyncope, hypotension Feraheme  08/14/2020 and 08/21/2020-tolerated well Hiatal hernia Family history of colon and bladder cancer Transverse colon tubular adenoma on the colonoscopy 01/03/2020 Hypertension Glaucoma Admission 02/12/2022 with choledocholithiasis, status post ERCP and stone removal Left chest  wall masses on CT 01/13/2023-CT-guided biopsy of left  chest wall mass 02/21/2023 Hypercalcemia 03/07/2023-Zometa  and IV fluids Echocardiogram 03/10/2023-LVEF 65%, normal left and right ventricular function         Disposition: Amber Park is in clinical remission from lymphoma.  Port-A-Cath remains in place.  She would like to keep the Port-A-Cath in place until the next office visit here.  The MCV was low and the hemoglobin/ferritin were at the low end of the normal range when she was here in August.  She is not taking iron .  She does not wish to resume iron  as oral iron  therapy causes GI upset.  Amber Park will return for an office visit, labs, and Port-A-Cath flush in 2 months.  She received an influenza vaccine today.  Arley Hof, MD  04/27/2024  12:33 PM

## 2024-04-28 ENCOUNTER — Telehealth: Payer: Self-pay | Admitting: Oncology

## 2024-04-28 NOTE — Telephone Encounter (Signed)
 Patient has been scheduled for follow-up visit per 04/28/24 LOS.  Pt noted appt details on personal planner/calendar.

## 2024-04-29 ENCOUNTER — Other Ambulatory Visit: Payer: Self-pay

## 2024-05-06 ENCOUNTER — Other Ambulatory Visit: Payer: Self-pay

## 2024-05-15 ENCOUNTER — Other Ambulatory Visit: Payer: Self-pay | Admitting: Gastroenterology

## 2024-06-16 ENCOUNTER — Other Ambulatory Visit: Payer: Self-pay

## 2024-06-21 ENCOUNTER — Ambulatory Visit: Admitting: Nurse Practitioner

## 2024-06-21 ENCOUNTER — Other Ambulatory Visit

## 2024-06-21 ENCOUNTER — Inpatient Hospital Stay: Admitting: Oncology

## 2024-06-21 ENCOUNTER — Encounter: Payer: Self-pay | Admitting: Oncology

## 2024-06-21 ENCOUNTER — Telehealth: Payer: Self-pay | Admitting: Oncology

## 2024-06-21 ENCOUNTER — Inpatient Hospital Stay

## 2024-06-21 ENCOUNTER — Inpatient Hospital Stay: Attending: Oncology

## 2024-06-21 ENCOUNTER — Inpatient Hospital Stay: Admitting: Nurse Practitioner

## 2024-06-21 VITALS — BP 132/52 | HR 57 | Temp 97.3°F | Resp 18 | Wt 152.2 lb

## 2024-06-21 DIAGNOSIS — C8590 Non-Hodgkin lymphoma, unspecified, unspecified site: Secondary | ICD-10-CM

## 2024-06-21 DIAGNOSIS — Z79899 Other long term (current) drug therapy: Secondary | ICD-10-CM | POA: Insufficient documentation

## 2024-06-21 DIAGNOSIS — Z85038 Personal history of other malignant neoplasm of large intestine: Secondary | ICD-10-CM | POA: Diagnosis present

## 2024-06-21 DIAGNOSIS — C859A Non-Hodgkin lymphoma, unspecified, in remission: Secondary | ICD-10-CM | POA: Diagnosis present

## 2024-06-21 DIAGNOSIS — D509 Iron deficiency anemia, unspecified: Secondary | ICD-10-CM | POA: Diagnosis not present

## 2024-06-21 DIAGNOSIS — D500 Iron deficiency anemia secondary to blood loss (chronic): Secondary | ICD-10-CM | POA: Diagnosis not present

## 2024-06-21 LAB — CBC WITH DIFFERENTIAL (CANCER CENTER ONLY)
Abs Immature Granulocytes: 0.04 K/uL (ref 0.00–0.07)
Basophils Absolute: 0.1 K/uL (ref 0.0–0.1)
Basophils Relative: 1 %
Eosinophils Absolute: 0.1 K/uL (ref 0.0–0.5)
Eosinophils Relative: 1 %
HCT: 36.1 % (ref 36.0–46.0)
Hemoglobin: 11.7 g/dL — ABNORMAL LOW (ref 12.0–15.0)
Immature Granulocytes: 1 %
Lymphocytes Relative: 13 %
Lymphs Abs: 1.1 K/uL (ref 0.7–4.0)
MCH: 25.8 pg — ABNORMAL LOW (ref 26.0–34.0)
MCHC: 32.4 g/dL (ref 30.0–36.0)
MCV: 79.7 fL — ABNORMAL LOW (ref 80.0–100.0)
Monocytes Absolute: 1.3 K/uL — ABNORMAL HIGH (ref 0.1–1.0)
Monocytes Relative: 14 %
Neutro Abs: 6.2 K/uL (ref 1.7–7.7)
Neutrophils Relative %: 70 %
Platelet Count: 245 K/uL (ref 150–400)
RBC: 4.53 MIL/uL (ref 3.87–5.11)
RDW: 14.1 % (ref 11.5–15.5)
WBC Count: 8.8 K/uL (ref 4.0–10.5)
nRBC: 0 % (ref 0.0–0.2)

## 2024-06-21 LAB — LACTATE DEHYDROGENASE: LDH: 176 U/L (ref 105–235)

## 2024-06-21 LAB — FERRITIN: Ferritin: 16 ng/mL (ref 11–307)

## 2024-06-21 NOTE — Progress Notes (Signed)
 [facility] OFFICE PROGRESS NOTE   Diagnosis: Non-Hodgkin's lymphoma, history of iron  deficiency anemia  INTERVAL HISTORY:   Ms. Amber Park returns as scheduled.  She has a good appetite.  Palpable lymph nodes.  She reports pain at the right side of her neck today.  She feels she slept wrong .  She has frequent bowel movements.  No bleeding.  Objective:  Vital signs in last 24 hours:  Blood pressure (!) 132/52, pulse (!) 57, temperature (!) 97.3 F (36.3 C), temperature source Temporal, resp. rate 18, weight 152 lb 3.2 oz (69 kg), SpO2 96%.    HEENT: Neck without mass.  No tenderness at the right side of the neck. Lymphatics: No cervical, supraclavicular, axillary, or inguinal nodes Resp: Lungs clear bilaterally Cardio: Regular rate and rhythm GI: No mass, nontender, no hepatosplenomegaly Vascular: No leg edema, no right neck edema  Portacath/PICC-without erythema  Lab Results:  Lab Results  Component Value Date   WBC 8.8 06/21/2024   HGB 11.7 (L) 06/21/2024   HCT 36.1 06/21/2024   MCV 79.7 (L) 06/21/2024   PLT 245 06/21/2024   NEUTROABS 6.2 06/21/2024    CMP  Lab Results  Component Value Date   NA 136 12/22/2023   K 4.0 12/22/2023   CL 97 (L) 12/22/2023   CO2 25 12/22/2023   GLUCOSE 86 12/22/2023   BUN 21 12/22/2023   CREATININE 1.12 (H) 12/22/2023   CALCIUM 9.9 12/22/2023   PROT 6.8 07/29/2023   ALBUMIN  4.2 07/29/2023   AST 24 07/29/2023   ALT 10 07/29/2023   ALKPHOS 73 07/29/2023   BILITOT 0.5 07/29/2023   GFRNONAA 45 (L) 12/22/2023   GFRAA 60 (L) 02/13/2020    Lab Results  Component Value Date   CEA1 1.78 12/04/2020   CEA 1.21 07/29/2023    Medications: I have reviewed the patient's current medications.   Assessment/Plan: Colon cancer-stage IIIb Cecal mass on colonoscopy 01/03/2020, biopsy confirmed adenocarcinoma CTs 01/11/2020-cecal mass, prominent subcentimeter mesenteric nodes adjacent to the cecum, multiple tiny pulmonary  nodules-metastases not favored Laparoscopic right colectomy 02/10/2020,pT3pN1a, grade 2-grade 3, 1/27 lymph nodes, 1 tumor deposit, MSI-high, loss of MLH1 and PMS 2 expression; MLH1 methylation detected CTs 02/05/2021-no evidence of recurrent disease, stable tiny upper lobe pulmonary nodules considered benign CTs 02/12/2022-nonspecific lymphadenopathy in the mesentery with mild mesenteric stranding, largest nodes measure up to 8 mm with no evidence of metastatic disease in the chest CTs 01/13/2023-new pleural-based masses in the lateral left upper chest with destruction of the left anterior second rib, no other evidence of metastatic disease CT-guided biopsy of left chest wall mass 02/21/2023-B-cell lymphoma with a high Ki-67, Bcl-2 positive, negative for double hit or triple hit lymphoma, large B-cell lymphoma per communication with the pathologist PET 03/11/2023-intense hypermetabolic activity associated with left chest wall mass, no evidence of additional sites of lymphoma, no evidence of recurrent colon cancer Cycle 1R-mini CHOP 03/14/2023 Cycle 2R-mini CHOP 04/07/2023 Cycle 3R-mini CHOP 04/28/2023 Cycle 4R-mini CHOP 05/11/2023, vincristine  held secondary to neuropathy symptoms PET 06/05/2023-no residual left chest wall mass, remaining metabolic activity at the superior lateral left chest wall SUV 4.0 (previously 30.1), Deauville 3 Cycle 5R-mini CHOP 06/16/2023, vincristine  held due to neuropathy symptoms, Udenyca  Cycle 6R-mini CHOP 07/07/2023, vincristine  held due to neuropathy, Udenyca  Iron  deficiency anemia secondary to #1 and history of AVMs/Cameron's erosions Ferrous gluconate 07/24/2020-allergic/infusion reaction with chest and back pain, diarrhea, nausea/vomiting, chills, presyncope, hypotension Feraheme  08/14/2020 and 08/21/2020-tolerated well Hiatal hernia Family history of colon and bladder cancer  Transverse colon tubular adenoma on the colonoscopy 01/03/2020 Hypertension Glaucoma Admission  02/12/2022 with choledocholithiasis, status post ERCP and stone removal Left chest wall masses on CT 01/13/2023-CT-guided biopsy of left chest wall mass 02/21/2023 Hypercalcemia 03/07/2023-Zometa  and IV fluids Echocardiogram 03/10/2023-LVEF 65%, normal left and right ventricular function        Disposition: Ms. Amber Park remains in clinical remission from lymphoma.  A Port-A-Cath remains in place.  She would like to keep the Port-A-Cath in place.  She will return for a Port-A-Cath flush and lab visit in 6 weeks.  She will be scheduled for an office visit in 12 weeks.  The hemoglobin is mildly low with red cell microcytosis.  The ferritin is in the low end of the normal range.  Ms. Amber Park has difficulty tolerating oral iron .  We will check a CBC and ferritin when she is here in 6 weeks and 12 weeks.  We will administer IV iron  as needed.  Ms. Amber Park has undergone upper and lower endoscopic evaluations for iron  deficiency anemia in the past.   Arley Hof, MD  06/21/2024  12:53 PM

## 2024-06-21 NOTE — Telephone Encounter (Signed)
 Patient has been scheduled for follow-up visit per 06/21/2024 LOS.  Pt noted appt details on personal planner/calendar.

## 2024-06-22 ENCOUNTER — Other Ambulatory Visit: Payer: Self-pay

## 2024-06-22 DIAGNOSIS — I129 Hypertensive chronic kidney disease with stage 1 through stage 4 chronic kidney disease, or unspecified chronic kidney disease: Secondary | ICD-10-CM | POA: Diagnosis not present

## 2024-06-22 DIAGNOSIS — C8590 Non-Hodgkin lymphoma, unspecified, unspecified site: Secondary | ICD-10-CM | POA: Diagnosis not present

## 2024-06-22 DIAGNOSIS — N1832 Chronic kidney disease, stage 3b: Secondary | ICD-10-CM | POA: Diagnosis not present

## 2024-07-01 DIAGNOSIS — H524 Presbyopia: Secondary | ICD-10-CM | POA: Diagnosis not present

## 2024-07-01 DIAGNOSIS — Z961 Presence of intraocular lens: Secondary | ICD-10-CM | POA: Diagnosis not present

## 2024-07-01 DIAGNOSIS — H40053 Ocular hypertension, bilateral: Secondary | ICD-10-CM | POA: Diagnosis not present

## 2024-07-01 DIAGNOSIS — H5213 Myopia, bilateral: Secondary | ICD-10-CM | POA: Diagnosis not present

## 2024-07-01 DIAGNOSIS — H52223 Regular astigmatism, bilateral: Secondary | ICD-10-CM | POA: Diagnosis not present

## 2024-07-01 NOTE — Progress Notes (Signed)
 " 07/01/24   CHIEF COMPLAINT Patient presents for  Chief Complaint  Patient presents with   Follow Up Exam    89 year old female here for IOP check  -last eye exam 12/31/23, Dr. Scarlet  -pt reports things have been stable and she is keeping up with her drops.  -pt was prev. Having some burning but has sense gone down  -no eye gtts -continuing latanoprost        HISTORY OF PRESENT ILLNESS: Amber Park is a 89 y.o. female who present to the clinic today for:  HPI     Follow Up Exam    Additional comments: 89 year old female here for IOP check  -last eye exam 12/31/23, Dr. Scarlet  -pt reports things have been stable and she is keeping up with her drops.  -pt was prev. Having some burning but has sense gone down  -no eye gtts -continuing latanoprost          Comments   1. Ocular hypertension, bilateral (Primary) 2. Pseudophakia both eyes       Last edited by Rodell Codding, OA on 07/01/2024  1:54 PM.      HISTORICAL INFORMATION:  CURRENT MEDICATIONS: Medications Ordered Prior to Encounter[1]  Referring physician: Denise Debby Manly, DO [address] [place],  [place] [address]  REVIEW OF SYSTEMS ROS   Positive for: Eyes Last edited by Rodell Codding, OA on 07/01/2024  1:54 PM.      ALLERGIES Allergies[2]  PAST MEDICAL HISTORY Medical History[3]  PAST SURGICAL HISTORY Surgical History[4]  FAMILY HISTORY Family History[5]  SOCIAL HISTORY Social History[6]  OPHTHALMIC EXAM Base Eye Exam     Visual Acuity (Snellen - Linear)       Right Left   Dist cc 20/40 20/25    Correction: Glasses         Tonometry (iCare, 1:56 PM)       Right Left   Pressure 12 12         Tonometry #2 (Applanation: Fluress OU, 2:20 PM)       Right Left   Pressure 19 19         Pupils       Pupils Shape APD   Right PERRL Round None   Left PERRL Round None         Neuro/Psych     Oriented x3: Yes   Mood/Affect: Normal            Slit Lamp and Fundus Exam     External Exam       Right Left   External Normal Normal         Slit Lamp Exam       Right Left   Lids/Lashes Dermatochalasis, scurf Dermatochalasis, scurf    Conjunctiva/Sclera White and quiet White and quiet   Cornea Clear Clear   Anterior Chamber Deep and quiet Deep and quiet   Iris Round and reactive Round and reactive   Lens Posterior chamber intraocular lens Posterior chamber intraocular lens   Anterior Vitreous Normal Normal         Fundus Exam       Right Left   Disc Normal; tilted Tilted disc; tilted   C/D Ratio 0.55 0.55   Macula Normal Normal   Vessels Normal Normal   Periphery Normal Normal           Refraction     Wearing Rx       Sphere Cylinder Axis  Add   Right -2.75 +1.50 165 +2.75   Left -2.00 +1.00 140 +2.75         Wearing Rx #2       Sphere Cylinder Axis Add   Right -2.75 +1.50 165 +2.75   Left -2.00 +1.00 140 +2.75         Manifest Refraction (Subjective)       Sphere Cylinder Axis Dist VA   Right -2.75 +1.50 165 20/30   Left -1.50 +1.00 140 20/20-2         Final Rx       Sphere Cylinder Axis Dist VA Add   Right -2.75 +1.50 165 20/30 +2.75   Left -1.50 +1.00 140 20/20-2 +2.75    Expiration Date: 07/01/2026             IMAGING AND PROCEDURES  Imaging and Procedures for 07/01/2024:    Prior Imaging and Procedures:    ASSESSMENT/PLAN:  1. Ocular hypertension, bilateral (Primary) IOP stable with treatment Continue: Latanoprost  x 1 OU  2. Bilateral pseudophakia stable  3. Myopia of both eyes with regular astigmatism and presbyopia Rx glasses dispensed    Ophthalmic Meds Ordered this visit: No orders of the defined types were placed in this encounter.     Return in about 6 months (around 12/30/2024) for 6 month dfe/oct rnfl.  There are no Patient Instructions on file for this visit.   Explained the diagnoses, plan, and follow up with the patient and  they expressed understanding.  Patient expressed understanding of the importance of proper follow up care.    Abbreviations: M myopia (nearsighted); A astigmatism; H hyperopia (farsighted); P presbyopia; Mrx spectacle prescription;  CTL contact lenses; OD right eye; OS left eye; OU both eyes  XT exotropia; ET esotropia; PEK punctate epithelial keratitis; PEE punctate epithelial erosions; DES dry eye syndrome; MGD meibomian gland dysfunction; ATs artificial tears; PFAT's preservative free artificial tears; NSC nuclear sclerotic cataract; PSC posterior subcapsular cataract; ERM epi-retinal membrane; PVD posterior vitreous detachment; RD retinal detachment; DM diabetes mellitus; DR diabetic retinopathy; NPDR non-proliferative diabetic retinopathy; PDR proliferative diabetic retinopathy; CSME clinically significant macular edema; DME diabetic macular edema; dbh dot blot hemorrhages; CWS cotton wool spot; POAG primary open angle glaucoma; C/D cup-to-disc ratio; HVF humphrey visual field; GVF goldmann visual field; OCT optical coherence tomography; IOP intraocular pressure; BRVO Branch retinal vein occlusion; CRVO central retinal vein occlusion; CRAO central retinal artery occlusion; BRAO branch retinal artery occlusion; RT retinal tear; SB scleral buckle; PPV pars plana vitrectomy; VH Vitreous hemorrhage; PRP panretinal laser photocoagulation; IVK intravitreal kenalog; VMT vitreomacular traction; MH Macular hole;  NVD neovascularization of the disc; NVE neovascularization elsewhere; AREDS age related eye disease study; ARMD age related macular degeneration; POAG primary open angle glaucoma; EBMD epithelial/anterior basement membrane dystrophy; ACIOL anterior chamber intraocular lens; IOL intraocular lens; PCIOL posterior chamber intraocular lens; Phaco/IOL phacoemulsification with intraocular lens placement; PRK photorefractive keratectomy; LASIK laser assisted in situ keratomileusis; HTN hypertension; DM diabetes  mellitus; COPD chronic obstructive pulmonary disease       [1] Current Outpatient Medications on File Prior to Visit  Medication Sig Dispense Refill   amLODIPine  (NORVASC ) 2.5 mg tablet TAKE 1 TABLET BY MOUTH EVERY DAY  1   famotidine  (PEPCID ) 10 mg tablet Take 10 mg by mouth 2 (two) times a day.     glucosamine-chondroitin (CIDAFLEX) 500-400 mg tab per tablet Take 1 tablet by mouth 3 (three) times a day.     hydroCHLOROthiazide  (HYDRODIURIL ) 25 mg tablet  Take 25 mg by mouth Once Daily.     latanoprost  (XALATAN ) 0.005 % ophthalmic solution Administer 1 drop into both eyes nightly. 7.5 mL 3   predniSONE  (DELTASONE ) 20 mg tablet Take 60 mg by mouth.     tiZANidine  (ZANAFLEX ) 4 mg capsule Take 4 mg by mouth 3 (three) times a day.     No current facility-administered medications on file prior to visit.  [2] Allergies Allergen Reactions   Ferrous Gluconate Other (See Comments)    Back and chest pain, nausea, diarrhea, chills, hypotension, presyncope   Codeine GI Intolerance and Other (See Comments)   Meperidine Hcl Other (See Comments)   Propofol  Other (See Comments)    Anesthetics -- N/V   Simvastatin Other (See Comments)    Other reaction(s): muscle pain  [3] Past Medical History: Diagnosis Date   Arthritis    Colon cancer    (CMD)    Glaucoma, unspecified glaucoma type, unspecified laterality    Med Hx: Glaucoma; glaucoma suspect   Hypertension   [4] Past Surgical History: Procedure Laterality Date   CATARACT EXTRACTION  04/21/2001   Procedure: CATARACT EXTRACTION; PCIOL OS   CATARACT EXTRACTION  10/21/2000   Procedure: CATARACT EXTRACTION; PCIOL OD   HYSTERECTOMY   1963   Procedure: HYSTERECTOMY   TONSILLECTOMY     Procedure: TONSILLECTOMY   TOTAL HIP ARTHROPLASTY     Procedure: TOTAL HIP REPLACEMENT; 05/31/2014  [5] Family History Problem Relation Name Age of Onset   Cancer Mother     Hypertension Father     Glaucoma Brother      Hypertension Brother     Glaucoma Sister    [6] Social History Tobacco Use   Smoking status: Former   Smokeless tobacco: Never  Substance Use Topics   Alcohol use: Yes    Alcohol/week: 8.3 standard drinks of alcohol   Drug use: No  "

## 2024-07-09 ENCOUNTER — Other Ambulatory Visit: Payer: Self-pay

## 2024-08-02 ENCOUNTER — Ambulatory Visit: Payer: Self-pay | Admitting: Oncology

## 2024-08-02 ENCOUNTER — Inpatient Hospital Stay: Attending: Oncology

## 2024-08-02 DIAGNOSIS — D500 Iron deficiency anemia secondary to blood loss (chronic): Secondary | ICD-10-CM

## 2024-08-02 LAB — CBC WITH DIFFERENTIAL (CANCER CENTER ONLY)
Abs Immature Granulocytes: 0.02 K/uL (ref 0.00–0.07)
Basophils Absolute: 0.1 K/uL (ref 0.0–0.1)
Basophils Relative: 1 %
Eosinophils Absolute: 0.2 K/uL (ref 0.0–0.5)
Eosinophils Relative: 2 %
HCT: 36.2 % (ref 36.0–46.0)
Hemoglobin: 12 g/dL (ref 12.0–15.0)
Immature Granulocytes: 0 %
Lymphocytes Relative: 16 %
Lymphs Abs: 1.2 K/uL (ref 0.7–4.0)
MCH: 26.1 pg (ref 26.0–34.0)
MCHC: 33.1 g/dL (ref 30.0–36.0)
MCV: 78.7 fL — ABNORMAL LOW (ref 80.0–100.0)
Monocytes Absolute: 0.8 K/uL (ref 0.1–1.0)
Monocytes Relative: 10 %
Neutro Abs: 5.4 K/uL (ref 1.7–7.7)
Neutrophils Relative %: 71 %
Platelet Count: 271 K/uL (ref 150–400)
RBC: 4.6 MIL/uL (ref 3.87–5.11)
RDW: 14.3 % (ref 11.5–15.5)
WBC Count: 7.7 K/uL (ref 4.0–10.5)
nRBC: 0 % (ref 0.0–0.2)

## 2024-08-02 LAB — FERRITIN: Ferritin: 12 ng/mL (ref 11–307)

## 2024-08-02 NOTE — Patient Instructions (Signed)

## 2024-08-03 ENCOUNTER — Encounter: Payer: Self-pay | Admitting: Nurse Practitioner

## 2024-08-03 ENCOUNTER — Encounter: Payer: Self-pay | Admitting: Oncology

## 2024-08-03 NOTE — Telephone Encounter (Signed)
 Called and spoke with patient directly about recent lab results per Dr. Cloretta. Patient confirmed she is aware of F/U appointments with this office in February. Patient denied any other questions/needs at this time

## 2024-08-03 NOTE — Telephone Encounter (Signed)
-----   Message from Arley Hof, MD sent at 08/02/2024  3:05 PM EST ----- Please call patient, the hemoglobin and iron  level are at the low end of the normal range, f/u as scheduled

## 2024-09-13 ENCOUNTER — Inpatient Hospital Stay: Admitting: Nurse Practitioner

## 2024-09-13 ENCOUNTER — Inpatient Hospital Stay
